# Patient Record
Sex: Female | Born: 1945
Health system: Southern US, Community
[De-identification: ages and names within clinical notes are randomized; demographics above are authoritative.]

## PROBLEM LIST (undated history)

## (undated) DIAGNOSIS — I10 Essential (primary) hypertension: Secondary | ICD-10-CM

## (undated) DIAGNOSIS — R569 Unspecified convulsions: Secondary | ICD-10-CM

## (undated) DIAGNOSIS — D649 Anemia, unspecified: Secondary | ICD-10-CM

## (undated) DIAGNOSIS — M1990 Unspecified osteoarthritis, unspecified site: Secondary | ICD-10-CM

## (undated) DIAGNOSIS — K863 Pseudocyst of pancreas: Secondary | ICD-10-CM

## (undated) DIAGNOSIS — F419 Anxiety disorder, unspecified: Secondary | ICD-10-CM

## (undated) DIAGNOSIS — I499 Cardiac arrhythmia, unspecified: Secondary | ICD-10-CM

## (undated) DIAGNOSIS — E139 Other specified diabetes mellitus without complications: Secondary | ICD-10-CM

## (undated) DIAGNOSIS — K8681 Exocrine pancreatic insufficiency: Secondary | ICD-10-CM

## (undated) DIAGNOSIS — E785 Hyperlipidemia, unspecified: Secondary | ICD-10-CM

## (undated) DIAGNOSIS — Z972 Presence of dental prosthetic device (complete) (partial): Secondary | ICD-10-CM

## (undated) DIAGNOSIS — K259 Gastric ulcer, unspecified as acute or chronic, without hemorrhage or perforation: Secondary | ICD-10-CM

## (undated) DIAGNOSIS — E119 Type 2 diabetes mellitus without complications: Secondary | ICD-10-CM

## (undated) HISTORY — DX: Exocrine pancreatic insufficiency: K86.81

## (undated) HISTORY — PX: CYSTECTOMY: SUR359

## (undated) HISTORY — DX: Unspecified osteoarthritis, unspecified site: M19.90

## (undated) HISTORY — DX: Essential (primary) hypertension: I10

## (undated) HISTORY — DX: Anxiety disorder, unspecified: F41.9

## (undated) HISTORY — DX: Pseudocyst of pancreas: K86.3

## (undated) HISTORY — DX: Unspecified convulsions: R56.9

## (undated) HISTORY — PX: LAPAROSCOPIC OVARIAN CYSTECTOMY: SUR786

## (undated) HISTORY — DX: Cardiac arrhythmia, unspecified: I49.9

## (undated) HISTORY — DX: Other specified diabetes mellitus without complications: E13.9

## (undated) HISTORY — DX: Anemia, unspecified: D64.9

## (undated) HISTORY — DX: Hyperlipidemia, unspecified: E78.5

## (undated) HISTORY — DX: Gastric ulcer, unspecified as acute or chronic, without hemorrhage or perforation: K25.9

## (undated) HISTORY — PX: TONSILLECTOMY: SUR1361

---

## 2011-01-28 DIAGNOSIS — M19049 Primary osteoarthritis, unspecified hand: Secondary | ICD-10-CM | POA: Diagnosis not present

## 2011-03-04 DIAGNOSIS — M159 Polyosteoarthritis, unspecified: Secondary | ICD-10-CM | POA: Diagnosis not present

## 2011-11-09 DIAGNOSIS — Z23 Encounter for immunization: Secondary | ICD-10-CM | POA: Diagnosis not present

## 2011-11-09 DIAGNOSIS — H43819 Vitreous degeneration, unspecified eye: Secondary | ICD-10-CM | POA: Diagnosis not present

## 2012-03-22 DIAGNOSIS — F411 Generalized anxiety disorder: Secondary | ICD-10-CM | POA: Diagnosis not present

## 2012-03-22 DIAGNOSIS — Z0000 Encounter for general adult medical examination without abnormal findings: Secondary | ICD-10-CM | POA: Diagnosis not present

## 2012-04-18 DIAGNOSIS — F411 Generalized anxiety disorder: Secondary | ICD-10-CM | POA: Diagnosis not present

## 2012-04-18 DIAGNOSIS — E785 Hyperlipidemia, unspecified: Secondary | ICD-10-CM | POA: Diagnosis not present

## 2012-05-09 ENCOUNTER — Ambulatory Visit: Payer: Self-pay | Admitting: Family Medicine

## 2012-05-09 DIAGNOSIS — F17200 Nicotine dependence, unspecified, uncomplicated: Secondary | ICD-10-CM | POA: Diagnosis not present

## 2012-05-09 DIAGNOSIS — M19039 Primary osteoarthritis, unspecified wrist: Secondary | ICD-10-CM | POA: Diagnosis not present

## 2012-05-09 DIAGNOSIS — M129 Arthropathy, unspecified: Secondary | ICD-10-CM | POA: Diagnosis not present

## 2012-05-09 DIAGNOSIS — Z23 Encounter for immunization: Secondary | ICD-10-CM | POA: Diagnosis not present

## 2012-05-09 DIAGNOSIS — Z0000 Encounter for general adult medical examination without abnormal findings: Secondary | ICD-10-CM | POA: Diagnosis not present

## 2012-05-09 DIAGNOSIS — F411 Generalized anxiety disorder: Secondary | ICD-10-CM | POA: Diagnosis not present

## 2012-09-22 DIAGNOSIS — Z23 Encounter for immunization: Secondary | ICD-10-CM | POA: Diagnosis not present

## 2013-06-10 DIAGNOSIS — F17200 Nicotine dependence, unspecified, uncomplicated: Secondary | ICD-10-CM | POA: Diagnosis not present

## 2013-06-10 DIAGNOSIS — R002 Palpitations: Secondary | ICD-10-CM | POA: Diagnosis not present

## 2013-06-10 DIAGNOSIS — E785 Hyperlipidemia, unspecified: Secondary | ICD-10-CM | POA: Diagnosis not present

## 2013-06-10 DIAGNOSIS — Z0000 Encounter for general adult medical examination without abnormal findings: Secondary | ICD-10-CM | POA: Diagnosis not present

## 2013-11-07 DIAGNOSIS — Z23 Encounter for immunization: Secondary | ICD-10-CM | POA: Diagnosis not present

## 2013-12-17 DIAGNOSIS — Z23 Encounter for immunization: Secondary | ICD-10-CM | POA: Diagnosis not present

## 2014-09-29 ENCOUNTER — Telehealth: Payer: Self-pay | Admitting: Family Medicine

## 2014-09-29 NOTE — Telephone Encounter (Signed)
Pt called needing  Refill on meds  Atenolol

## 2014-09-29 NOTE — Telephone Encounter (Signed)
Patient last seen 06/10/13. Patient needs bp f/u appt.

## 2014-10-01 ENCOUNTER — Encounter: Payer: Self-pay | Admitting: Family Medicine

## 2014-10-01 ENCOUNTER — Ambulatory Visit (INDEPENDENT_AMBULATORY_CARE_PROVIDER_SITE_OTHER): Payer: Medicare Other | Admitting: Family Medicine

## 2014-10-01 VITALS — BP 160/85 | HR 60 | Temp 97.7°F | Resp 16 | Ht 66.5 in | Wt 148.2 lb

## 2014-10-01 DIAGNOSIS — I499 Cardiac arrhythmia, unspecified: Secondary | ICD-10-CM | POA: Insufficient documentation

## 2014-10-01 DIAGNOSIS — I10 Essential (primary) hypertension: Secondary | ICD-10-CM | POA: Diagnosis not present

## 2014-10-01 DIAGNOSIS — I4940 Unspecified premature depolarization: Secondary | ICD-10-CM | POA: Diagnosis not present

## 2014-10-01 MED ORDER — METOPROLOL TARTRATE 25 MG PO TABS: 25.0000 mg | ORAL_TABLET | Freq: Two times a day (BID) | ORAL | Status: DC

## 2014-10-01 NOTE — Progress Notes (Signed)
Name: Holly Navarro   MRN: [id number]    DOB: 09-05-1945   Date:10/01/2014       Progress Note  Subjective  Chief Complaint  Chief Complaint  Patient presents with  . Irregular Heart Beat    needs refill was 06/15    HPI Here to get refills on Beta blocker.   She is running short and has been taking 1/2 Atenolol dose for past several days.  No f/u in > 1 yr.  Smoker with no desire to stop.  Her husband has adenocarcinoma of esophagus.  He has refused treatment. No problem-specific assessment & plan notes found for this encounter.   Past Medical History  Diagnosis Date  . Hyperlipidemia   . Anxiety   . Cardiac arrhythmia   . Arthritis   . Stomach ulcer     Past Surgical History  Procedure Laterality Date  . Cystectomy      Family History  Problem Relation Age of Onset  . Heart disease Mother   . Kidney disease Mother   . Diabetes Father   . Cancer Brother     pencreatic    Social History   Social History  . Marital Status: Married    Spouse Name: N/A  . Number of Children: N/A  . Years of Education: N/A   Occupational History  . Not on file.   Social History Main Topics  . Smoking status: Current Every Day Smoker -- 0.50 packs/day    Types: Cigarettes  . Smokeless tobacco: Not on file  . Alcohol Use: 0.0 oz/week    0 Standard drinks or equivalent per week  . Drug Use: No  . Sexual Activity: Not on file   Other Topics Concern  . Not on file   Social History Narrative  . No narrative on file     Current outpatient prescriptions:  .  atenolol (TENORMIN) 50 MG tablet, Take 50 mg by mouth daily., Disp: , Rfl:  .  co-enzyme Q-10 30 MG capsule, Take 30 mg by mouth 3 (three) times daily., Disp: , Rfl:  .  diphenhydrAMINE (SOMINEX) 25 MG tablet, Take 25 mg by mouth at bedtime as needed for sleep., Disp: , Rfl:  .  milk thistle 175 MG tablet, Take 175 mg by mouth daily., Disp: , Rfl:  .  omega-3 acid ethyl esters (LOVAZA) 1 G capsule, Take by mouth 2  (two) times daily., Disp: , Rfl:  .  Red Yeast Rice 600 MG CAPS, Take 1 capsule by mouth 2 (two) times daily., Disp: , Rfl:  .  simethicone (MYLICON) 80 MG chewable tablet, Chew 80 mg by mouth every 6 (six) hours as needed for flatulence., Disp: , Rfl:  .  Specialty Vitamins Products (ECHINACEA C COMPLETE PO), Take by mouth., Disp: , Rfl:  .  vitamin C (ASCORBIC ACID) 500 MG tablet, Take 500 mg by mouth daily. Only in winter, Disp: , Rfl:   No Known Allergies   Review of Systems  Constitutional: Negative for fever, chills, weight loss and malaise/fatigue.  HENT: Negative for hearing loss.   Eyes: Negative for blurred vision and double vision.  Respiratory: Negative for cough, sputum production, shortness of breath and wheezing.   Cardiovascular: Negative for chest pain, palpitations, orthopnea and leg swelling.  Gastrointestinal: Positive for heartburn (takes OTC Tagamet prn). Negative for abdominal pain and blood in stool.  Genitourinary: Negative for dysuria, urgency and frequency.  Musculoskeletal: Positive for joint pain. Negative for myalgias.  Skin: Negative for  rash.  Neurological: Negative for dizziness, sensory change, focal weakness, weakness and headaches.      Objective  Filed Vitals:   10/01/14 0942  BP: 166/86  Pulse: 62  Temp: 97.7 F (36.5 C)  TempSrc: Oral  Resp: 16  Height: 5' 6.5" (1.689 m)  Weight: 148 lb 3.2 oz (67.223 kg)    Physical Exam  Constitutional: She is oriented to person, place, and time and well-developed, well-nourished, and in no distress. No distress.  HENT:  Head: Normocephalic and atraumatic.  Eyes: Conjunctivae and EOM are normal. Pupils are equal, round, and reactive to light.  Neck: Normal range of motion. Neck supple. No thyromegaly present.  Cardiovascular: Normal rate, regular rhythm, normal heart sounds and intact distal pulses.  Exam reveals no gallop and no friction rub.   No murmur heard. Pulmonary/Chest: Effort normal and  breath sounds normal. No respiratory distress. She has no wheezes. She has no rales.  Abdominal: Soft. Bowel sounds are normal. She exhibits no distension and no mass. There is no tenderness.  Musculoskeletal: Normal range of motion. She exhibits no edema.  Lymphadenopathy:    She has no cervical adenopathy.  Neurological: She is alert and oriented to person, place, and time.  Vitals reviewed.      No results found for this or any previous visit (from the past 2160 hour(s)).   Assessment & Plan  Problem List Items Addressed This Visit    None      Meds ordered this encounter  Medications  . atenolol (TENORMIN) 50 MG tablet    Sig: Take 50 mg by mouth daily.  Marland Kitchen Specialty Vitamins Products (ECHINACEA C COMPLETE PO)    Sig: Take by mouth.  . simethicone (MYLICON) 80 MG chewable tablet    Sig: Chew 80 mg by mouth every 6 (six) hours as needed for flatulence.  . diphenhydrAMINE (SOMINEX) 25 MG tablet    Sig: Take 25 mg by mouth at bedtime as needed for sleep.  Marland Kitchen omega-3 acid ethyl esters (LOVAZA) 1 G capsule    Sig: Take by mouth 2 (two) times daily.  . Red Yeast Rice 600 MG CAPS    Sig: Take 1 capsule by mouth 2 (two) times daily.  Marland Kitchen co-enzyme Q-10 30 MG capsule    Sig: Take 30 mg by mouth 3 (three) times daily.  . vitamin C (ASCORBIC ACID) 500 MG tablet    Sig: Take 500 mg by mouth daily. Only in winter  . milk thistle 175 MG tablet    Sig: Take 175 mg by mouth daily.   1. Essential hypertension  - metoprolol tartrate (LOPRESSOR) 25 MG tablet; Take 1 tablet (25 mg total) by mouth 2 (two) times daily.  Dispense: 60 tablet; Refill: 3 - Comprehensive Metabolic Panel (CMET) - Lipid Profile - TSH  2. Premature depolarization  - metoprolol tartrate (LOPRESSOR) 25 MG tablet; Take 1 tablet (25 mg total) by mouth 2 (two) times daily.  Dispense: 60 tablet; Refill: 3 - CBC with Differential - TSH

## 2014-10-01 NOTE — Patient Instructions (Addendum)
Discussed stopping smoking.  Patient refuses to consider stopping.  Patient reports that she will get Flu Shot at [facility] in Nov.

## 2014-10-15 ENCOUNTER — Telehealth: Payer: Self-pay | Admitting: Family Medicine

## 2014-10-15 MED ORDER — ATENOLOL 100 MG PO TABS: 100.0000 mg | ORAL_TABLET | Freq: Every day | ORAL | Status: DC

## 2014-10-15 NOTE — Telephone Encounter (Signed)
Patient walked in office and requested refill on her Atenolol 50. Per Dr.Hawkins 100 mg was to be sent. Per patient that's fine but she will cut in half.

## 2014-10-15 NOTE — Telephone Encounter (Signed)
That did not control her BP before; it will not now.  I will recheck BP at her next visit.-jh

## 2014-10-16 NOTE — Telephone Encounter (Signed)
Keep her appt. With me as scheduled.-jh

## 2014-10-16 NOTE — Telephone Encounter (Signed)
Patient states she was running out of Atenolol on last visit as she was taking 1/2 of 50 mg to stretch meds. She took 1/2 of the 100mg  that was filled on yesterday and feels great this morning. She took bp upon arising this morning and bp was 134/84.

## 2014-10-22 DIAGNOSIS — I10 Essential (primary) hypertension: Secondary | ICD-10-CM | POA: Diagnosis not present

## 2014-10-22 DIAGNOSIS — I4940 Unspecified premature depolarization: Secondary | ICD-10-CM | POA: Diagnosis not present

## 2014-10-23 LAB — COMPREHENSIVE METABOLIC PANEL
ALT: 18 IU/L (ref 0–32)
AST: 15 IU/L (ref 0–40)
Albumin/Globulin Ratio: 2 (ref 1.1–2.5)
Albumin: 4.3 g/dL (ref 3.6–4.8)
Alkaline Phosphatase: 54 IU/L (ref 39–117)
BUN/Creatinine Ratio: 13 (ref 11–26)
BUN: 13 mg/dL (ref 8–27)
Bilirubin Total: 0.5 mg/dL (ref 0.0–1.2)
CO2: 25 mmol/L (ref 18–29)
Calcium: 9.6 mg/dL (ref 8.7–10.3)
Chloride: 103 mmol/L (ref 97–106)
Creatinine, Ser: 1.02 mg/dL — ABNORMAL HIGH (ref 0.57–1.00)
GFR calc Af Amer: 65 mL/min/{1.73_m2} (ref >59)
GFR calc non Af Amer: 56 mL/min/{1.73_m2} — ABNORMAL LOW (ref >59)
Globulin, Total: 2.2 g/dL (ref 1.5–4.5)
Glucose: 85 mg/dL (ref 65–99)
Potassium: 4.7 mmol/L (ref 3.5–5.2)
Sodium: 140 mmol/L (ref 136–144)
Total Protein: 6.5 g/dL (ref 6.0–8.5)

## 2014-10-23 LAB — CBC WITH DIFFERENTIAL/PLATELET
Basophils Absolute: 0.1 10*3/uL (ref 0.0–0.2)
Basos: 1 %
EOS (ABSOLUTE): 0.2 10*3/uL (ref 0.0–0.4)
Eos: 2 %
Hematocrit: 43.2 % (ref 34.0–46.6)
Hemoglobin: 14.8 g/dL (ref 11.1–15.9)
Immature Grans (Abs): 0 10*3/uL (ref 0.0–0.1)
Immature Granulocytes: 0 %
Lymphocytes Absolute: 3.7 10*3/uL — ABNORMAL HIGH (ref 0.7–3.1)
Lymphs: 41 %
MCH: 32.3 pg (ref 26.6–33.0)
MCHC: 34.3 g/dL (ref 31.5–35.7)
MCV: 94 fL (ref 79–97)
Monocytes Absolute: 0.9 10*3/uL (ref 0.1–0.9)
Monocytes: 10 %
Neutrophils Absolute: 4.1 10*3/uL (ref 1.4–7.0)
Neutrophils: 46 %
Platelets: 267 10*3/uL (ref 150–379)
RBC: 4.58 x10E6/uL (ref 3.77–5.28)
RDW: 13.5 % (ref 12.3–15.4)
WBC: 8.9 10*3/uL (ref 3.4–10.8)

## 2014-10-23 LAB — LIPID PANEL
Chol/HDL Ratio: 6.1 ratio units — ABNORMAL HIGH (ref 0.0–4.4)
Cholesterol, Total: 262 mg/dL — ABNORMAL HIGH (ref 100–199)
HDL: 43 mg/dL (ref >39)
LDL Calculated: 189 mg/dL — ABNORMAL HIGH (ref 0–99)
Triglycerides: 152 mg/dL — ABNORMAL HIGH (ref 0–149)
VLDL Cholesterol Cal: 30 mg/dL (ref 5–40)

## 2014-10-23 LAB — TSH: TSH: 2.75 u[IU]/mL (ref 0.450–4.500)

## 2014-11-03 ENCOUNTER — Ambulatory Visit (INDEPENDENT_AMBULATORY_CARE_PROVIDER_SITE_OTHER): Payer: Medicare Other | Admitting: Family Medicine

## 2014-11-03 ENCOUNTER — Encounter: Payer: Self-pay | Admitting: Family Medicine

## 2014-11-03 VITALS — BP 150/90 | HR 78 | Temp 98.3°F | Resp 16 | Ht 66.0 in | Wt 147.6 lb

## 2014-11-03 DIAGNOSIS — Z23 Encounter for immunization: Secondary | ICD-10-CM

## 2014-11-03 DIAGNOSIS — I129 Hypertensive chronic kidney disease with stage 1 through stage 4 chronic kidney disease, or unspecified chronic kidney disease: Secondary | ICD-10-CM | POA: Insufficient documentation

## 2014-11-03 DIAGNOSIS — I4940 Unspecified premature depolarization: Secondary | ICD-10-CM

## 2014-11-03 DIAGNOSIS — N289 Disorder of kidney and ureter, unspecified: Secondary | ICD-10-CM

## 2014-11-03 DIAGNOSIS — E785 Hyperlipidemia, unspecified: Secondary | ICD-10-CM | POA: Diagnosis not present

## 2014-11-03 DIAGNOSIS — N183 Chronic kidney disease, stage 3 unspecified: Secondary | ICD-10-CM | POA: Insufficient documentation

## 2014-11-03 DIAGNOSIS — I10 Essential (primary) hypertension: Secondary | ICD-10-CM | POA: Diagnosis not present

## 2014-11-03 MED ORDER — ATENOLOL 100 MG PO TABS: 100.0000 mg | ORAL_TABLET | Freq: Every day | ORAL | Status: DC

## 2014-11-03 NOTE — Patient Instructions (Signed)
Plan Lipid panel and BMP on return.

## 2014-11-03 NOTE — Progress Notes (Signed)
Name: Holly Navarro   MRN: [id number]    DOB: 07-26-1945   Date:11/03/2014       Progress Note  Subjective  Chief Complaint  Chief Complaint  Patient presents with  . Hypertension    medication question    HPI Here for f/u of HBP.  Blames her sl. Low GFR and elevated chol  On being changed from atenolol to metoprolol.  Now back on Atenolol and she has altered dose to 1/2 100 mg. Tab/d  because that is what she thinks she should take./   BPs at home in 120-130 range according to her. Back on Atenolol for 2.5 weeks.   She declines to get repeat BMP or Lipoid panel before early 2017. No problem-specific assessment & plan notes found for this encounter.   Past Medical History  Diagnosis Date  . Hyperlipidemia   . Anxiety   . Cardiac arrhythmia   . Arthritis   . Stomach ulcer     Social History  Substance Use Topics  . Smoking status: Current Every Day Smoker -- 0.50 packs/day    Types: Cigarettes  . Smokeless tobacco: Not on file  . Alcohol Use: 0.0 oz/week    0 Standard drinks or equivalent per week     Current outpatient prescriptions:  .  atenolol (TENORMIN) 100 MG tablet, Take 1 tablet (100 mg total) by mouth daily., Disp: 30 tablet, Rfl: 0 .  co-enzyme Q-10 30 MG capsule, Take 30 mg by mouth 3 (three) times daily., Disp: , Rfl:  .  milk thistle 175 MG tablet, Take 175 mg by mouth daily., Disp: , Rfl:  .  omega-3 acid ethyl esters (LOVAZA) 1 G capsule, Take by mouth 2 (two) times daily., Disp: , Rfl:  .  Red Yeast Rice 600 MG CAPS, Take 1 capsule by mouth 2 (two) times daily., Disp: , Rfl:  .  simethicone (MYLICON) 80 MG chewable tablet, Chew 80 mg by mouth every 6 (six) hours as needed for flatulence. Pt takes capsule not chewable, Disp: , Rfl:  .  Specialty Vitamins Products (ECHINACEA C COMPLETE PO), Take by mouth., Disp: , Rfl:  .  vitamin C (ASCORBIC ACID) 500 MG tablet, Take 500 mg by mouth daily. Only in winter, Disp: , Rfl:   No Known Allergies  Review of  Systems  Constitutional: Negative for fever, chills, weight loss and malaise/fatigue.  HENT: Negative for hearing loss.   Eyes: Negative for blurred vision and double vision.  Respiratory: Negative for cough, shortness of breath and wheezing.   Cardiovascular: Negative for chest pain, palpitations and leg swelling.  Gastrointestinal: Positive for heartburn (occ.). Negative for abdominal pain and blood in stool.  Genitourinary: Negative for dysuria, urgency and frequency.  Musculoskeletal: Positive for joint pain (multiple). Negative for myalgias.  Skin: Negative for rash.  Neurological: Negative for dizziness, tremors, weakness and headaches.      Objective  Filed Vitals:   11/03/14 1304  BP: 145/88  Pulse: 65  Temp: 98.3 F (36.8 C)  TempSrc: Oral  Resp: 16  Height:  (1.676 m)  Weight: 147 lb 9.6 oz (66.951 kg)     Physical Exam  Constitutional: She is oriented to person, place, and time and well-developed, well-nourished, and in no distress. No distress.  HENT:  Head: Normocephalic and atraumatic.  Eyes: Conjunctivae and EOM are normal. Pupils are equal, round, and reactive to light. No scleral icterus.  Neck: Normal range of motion. Neck supple. Carotid bruit is not present.  No thyromegaly present.  Cardiovascular: Normal rate, regular rhythm, normal heart sounds and intact distal pulses.  Exam reveals no gallop and no friction rub.   No murmur heard. Pulmonary/Chest: Effort normal and breath sounds normal. No respiratory distress. She has no wheezes. She has no rales.  Abdominal: Soft. Bowel sounds are normal. She exhibits no distension and no mass. There is no tenderness.  Musculoskeletal: Normal range of motion. She exhibits no edema.  Lymphadenopathy:    She has no cervical adenopathy.  Neurological: She is alert and oriented to person, place, and time.  Vitals reviewed.     Recent Results (from the past 2160 hour(s))  Comprehensive Metabolic Panel (CMET)      Status: Abnormal   Collection Time: 10/22/14  1:04 PM  Result Value Ref Range   Glucose 85 65 - 99 mg/dL   BUN 13 8 - 27 mg/dL   Creatinine, Ser 1.61 (H) 0.57 - 1.00 mg/dL   GFR calc non Af Amer 56 (L) >59 mL/min/1.73   GFR calc Af Amer 65 >59 mL/min/1.73   BUN/Creatinine Ratio 13 11 - 26   Sodium 140 136 - 144 mmol/L    Comment:               **Please note reference interval change**   Potassium 4.7 3.5 - 5.2 mmol/L    Comment:               **Please note reference interval change**   Chloride 103 97 - 106 mmol/L    Comment:               **Please note reference interval change**   CO2 25 18 - 29 mmol/L   Calcium 9.6 8.7 - 10.3 mg/dL   Total Protein 6.5 6.0 - 8.5 g/dL   Albumin 4.3 3.6 - 4.8 g/dL   Globulin, Total 2.2 1.5 - 4.5 g/dL   Albumin/Globulin Ratio 2.0 1.1 - 2.5   Bilirubin Total 0.5 0.0 - 1.2 mg/dL   Alkaline Phosphatase 54 39 - 117 IU/L   AST 15 0 - 40 IU/L   ALT 18 0 - 32 IU/L  CBC with Differential     Status: Abnormal   Collection Time: 10/22/14  1:04 PM  Result Value Ref Range   WBC 8.9 3.4 - 10.8 x10E3/uL   RBC 4.58 3.77 - 5.28 x10E6/uL   Hemoglobin 14.8 11.1 - 15.9 g/dL   Hematocrit 09.6 04.5 - 46.6 %   MCV 94 79 - 97 fL   MCH 32.3 26.6 - 33.0 pg   MCHC 34.3 31.5 - 35.7 g/dL   RDW 40.9 81.1 - 91.4 %   Platelets 267 150 - 379 x10E3/uL   Neutrophils 46 %   Lymphs 41 %   Monocytes 10 %   Eos 2 %   Basos 1 %   Neutrophils Absolute 4.1 1.4 - 7.0 x10E3/uL   Lymphocytes Absolute 3.7 (H) 0.7 - 3.1 x10E3/uL   Monocytes Absolute 0.9 0.1 - 0.9 x10E3/uL   EOS (ABSOLUTE) 0.2 0.0 - 0.4 x10E3/uL   Basophils Absolute 0.1 0.0 - 0.2 x10E3/uL   Immature Granulocytes 0 %   Immature Grans (Abs) 0.0 0.0 - 0.1 x10E3/uL  Lipid Profile     Status: Abnormal   Collection Time: 10/22/14  1:04 PM  Result Value Ref Range   Cholesterol, Total 262 (H) 100 - 199 mg/dL   Triglycerides 782 (H) 0 - 149 mg/dL   HDL 43 >95 mg/dL  Comment: According to ATP-III Guidelines,  HDL-C >59 mg/dL is considered a negative risk factor for CHD.    VLDL Cholesterol Cal 30 5 - 40 mg/dL   LDL Calculated 161189 (H) 0 - 99 mg/dL   Chol/HDL Ratio 6.1 (H) 0.0 - 4.4 ratio units    Comment:                                   T. Chol/HDL Ratio                                             Men  Women                               1/2 Avg.Risk  3.4    3.3                                   Avg.Risk  5.0    4.4                                2X Avg.Risk  9.6    7.1                                3X Avg.Risk 23.4   11.0   TSH     Status: None   Collection Time: 10/22/14  1:04 PM  Result Value Ref Range   TSH 2.750 0.450 - 4.500 uIU/mL     Assessment & Plan  1. Essential hypertension  - atenolol (TENORMIN) 100 MG tablet; Take 1 tablet (100 mg total) by mouth daily.  Dispense: 30 tablet; Refill: 6  2. Premature depolarization  - atenolol (TENORMIN) 100 MG tablet; Take 1 tablet (100 mg total) by mouth daily.  Dispense: 30 tablet; Refill: 6  3. Elevated lipids   4. Renal insufficiency   5. Need for influenza vaccination  - Flu vaccine HIGH DOSE PF (Fluzone High dose)

## 2014-11-10 ENCOUNTER — Other Ambulatory Visit: Payer: Self-pay | Admitting: Family Medicine

## 2015-01-12 ENCOUNTER — Ambulatory Visit: Payer: Medicare Other | Admitting: Family Medicine

## 2015-05-25 ENCOUNTER — Other Ambulatory Visit: Payer: Self-pay | Admitting: Family Medicine

## 2015-12-02 DIAGNOSIS — Z23 Encounter for immunization: Secondary | ICD-10-CM | POA: Diagnosis not present

## 2016-11-23 DIAGNOSIS — Z23 Encounter for immunization: Secondary | ICD-10-CM | POA: Diagnosis not present

## 2017-03-23 ENCOUNTER — Other Ambulatory Visit: Payer: Self-pay

## 2017-03-23 ENCOUNTER — Ambulatory Visit (INDEPENDENT_AMBULATORY_CARE_PROVIDER_SITE_OTHER): Payer: Medicare Other | Admitting: Nurse Practitioner

## 2017-03-23 VITALS — BP 168/94 | HR 66 | Temp 98.2°F | Ht 66.5 in | Wt 153.0 lb

## 2017-03-23 DIAGNOSIS — R002 Palpitations: Secondary | ICD-10-CM

## 2017-03-23 DIAGNOSIS — I10 Essential (primary) hypertension: Secondary | ICD-10-CM | POA: Diagnosis not present

## 2017-03-23 DIAGNOSIS — I498 Other specified cardiac arrhythmias: Secondary | ICD-10-CM | POA: Diagnosis not present

## 2017-03-23 MED ORDER — ATENOLOL 50 MG PO TABS: 50.0000 mg | ORAL_TABLET | Freq: Every day | ORAL | 3 refills | Status: DC

## 2017-03-23 MED ORDER — LISINOPRIL 10 MG PO TABS: 10.0000 mg | ORAL_TABLET | Freq: Every day | ORAL | 1 refills | Status: DC

## 2017-03-23 NOTE — Patient Instructions (Addendum)
Barbi A Principato,   Thank you for coming in to clinic today.  Continue atenolol 50 mg once daily.  START lisinopril 10 mg once daily. - We need to check you kidney function now. Return for labs on Monday.  We will recheck in about 4 weeks with your followup visit.  Please schedule a follow-up appointment with Wilhelmina Mcardle, AGNP. Return in about 1 month (around 04/20/2017) for hypertension.  If you have any other questions or concerns, please feel free to call the clinic or send a message through MyChart. You may also schedule an earlier appointment if necessary.  You will receive a survey after today's visit either digitally by e-mail or paper by [employer]. Your experiences and feedback matter to [place].  Please respond so we know how we are doing as we provide care for you.   Wilhelmina Mcardle, DNP, AGNP-BC Adult Gerontology Nurse Practitioner [facility], [facility]    Managing Your Hypertension Hypertension is commonly called high blood pressure. This is when the force of your blood pressing against the walls of your arteries is too strong. Arteries are blood vessels that carry blood from your heart throughout your body. Hypertension forces the heart to work harder to pump blood, and may cause the arteries to become narrow or stiff. Having untreated or uncontrolled hypertension can cause heart attack, stroke, kidney disease, and other problems. What are blood pressure readings? A blood pressure reading consists of a higher number over a lower number. Ideally, your blood pressure should be below 120/80. The first ("top") number is called the systolic pressure. It is a measure of the pressure in your arteries as your heart beats. The second ("bottom") number is called the diastolic pressure. It is a measure of the pressure in your arteries as the heart relaxes. What does my blood pressure reading mean? Blood pressure is classified into four stages. Based on your blood pressure reading,  your health care provider may use the following stages to determine what type of treatment you need, if any. Systolic pressure and diastolic pressure are measured in a unit called mm Hg. Normal  Systolic pressure: below 120.  Diastolic pressure: below 80. Elevated  Systolic pressure: 120-129.  Diastolic pressure: below 80. Hypertension stage 1  Systolic pressure: 130-139.  Diastolic pressure: 80-89. Hypertension stage 2  Systolic pressure: 140 or above.  Diastolic pressure: 90 or above. What health risks are associated with hypertension? Managing your hypertension is an important responsibility. Uncontrolled hypertension can lead to:  A heart attack.  A stroke.  A weakened blood vessel (aneurysm).  Heart failure.  Kidney damage.  Eye damage.  Metabolic syndrome.  Memory and concentration problems.  What changes can I make to manage my hypertension? Hypertension can be managed by making lifestyle changes and possibly by taking medicines. Your health care provider will help you make a plan to bring your blood pressure within a normal range. Eating and drinking  Eat a diet that is high in fiber and potassium, and low in salt (sodium), added sugar, and fat. An example eating plan is called the DASH (Dietary Approaches to Stop Hypertension) diet. To eat this way: ? Eat plenty of fresh fruits and vegetables. Try to fill half of your plate at each meal with fruits and vegetables. ? Eat whole grains, such as whole wheat pasta, brown rice, or whole grain bread. Fill about one quarter of your plate with whole grains. ? Eat low-fat diary products. ? Avoid fatty cuts of meat,  processed or cured meats, and poultry with skin. Fill about one quarter of your plate with lean proteins such as fish, chicken without skin, beans, eggs, and tofu. ? Avoid premade and processed foods. These tend to be higher in sodium, added sugar, and fat.  Reduce your daily sodium intake. Most people  with hypertension should eat less than 1,500 mg of sodium a day.  Limit alcohol intake to no more than 1 drink a day for nonpregnant women and 2 drinks a day for men. One drink equals 12 oz of beer, 5 oz of wine, or 1 oz of hard liquor. Lifestyle  Work with your health care provider to maintain a healthy body weight, or to lose weight. Ask what an ideal weight is for you.  Get at least 30 minutes of exercise that causes your heart to beat faster (aerobic exercise) most days of the week. Activities may include walking, swimming, or biking.  Include exercise to strengthen your muscles (resistance exercise), such as weight lifting, as part of your weekly exercise routine. Try to do these types of exercises for 30 minutes at least 3 days a week.  Do not use any products that contain nicotine or tobacco, such as cigarettes and e-cigarettes. If you need help quitting, ask your health care provider.  Control any long-term (chronic) conditions you have, such as high cholesterol or diabetes. Monitoring  Monitor your blood pressure at home as told by your health care provider. Your personal target blood pressure may vary depending on your medical conditions, your age, and other factors.  Have your blood pressure checked regularly, as often as told by your health care provider. Working with your health care provider  Review all the medicines you take with your health care provider because there may be side effects or interactions.  Talk with your health care provider about your diet, exercise habits, and other lifestyle factors that may be contributing to hypertension.  Visit your health care provider regularly. Your health care provider can help you create and adjust your plan for managing hypertension. Will I need medicine to control my blood pressure? Your health care provider may prescribe medicine if lifestyle changes are not enough to get your blood pressure under control, and if:  Your  systolic blood pressure is 130 or higher.  Your diastolic blood pressure is 80 or higher.  Take medicines only as told by your health care provider. Follow the directions carefully. Blood pressure medicines must be taken as prescribed. The medicine does not work as well when you skip doses. Skipping doses also puts you at risk for problems. Contact a health care provider if:  You think you are having a reaction to medicines you have taken.  You have repeated (recurrent) headaches.  You feel dizzy.  You have swelling in your ankles.  You have trouble with your vision. Get help right away if:  You develop a severe headache or confusion.  You have unusual weakness or numbness, or you feel faint.  You have severe pain in your chest or abdomen.  You vomit repeatedly.  You have trouble breathing. Summary  Hypertension is when the force of blood pumping through your arteries is too strong. If this condition is not controlled, it may put you at risk for serious complications.  Your personal target blood pressure may vary depending on your medical conditions, your age, and other factors. For most people, a normal blood pressure is less than 120/80.  Hypertension is managed by lifestyle changes,  medicines, or both. Lifestyle changes include weight loss, eating a healthy, low-sodium diet, exercising more, and limiting alcohol. This information is not intended to replace advice given to you by your health care provider. Make sure you discuss any questions you have with your health care provider. Document Released: 09/14/2011 Document Revised: 11/18/2015 Document Reviewed: 11/18/2015 Elsevier Interactive Patient Education  [employer]2018 [employer]

## 2017-03-23 NOTE — Progress Notes (Signed)
Subjective:    Patient ID: Holly Navarro, female    DOB: 02-08-1945, 72 y.o.   MRN: [id number]  Holly Navarro is a 72 y.o. female presenting on 03/23/2017 for Medication Refill Patient presents today for follow-up for hypertension.  HPI  Hypertension - She is not checking BP at home or outside of clinic.    - Current medications: atenolol 50 mg once daily despite prescription for 100 mg once daily, tolerating well without side effects - She is not currently symptomatic. - Pt denies headache, lightheadedness, dizziness, changes in vision, chest tightness/pressure, palpitations, leg swelling, sudden loss of speech or loss of consciousness. - She  reports no regular exercise routine. - Her diet is moderate in salt, moderate in fat, and moderate in carbohydrates.   Past Medical History:  Diagnosis Date  . Anxiety   . Arthritis   . Cardiac arrhythmia   . Hyperlipidemia   . Stomach ulcer    Past Surgical History:  Procedure Laterality Date  . CYSTECTOMY     Social History   Socioeconomic History  . Marital status: Married    Spouse name: Not on file  . Number of children: Not on file  . Years of education: Not on file  . Highest education level: Not on file  Occupational History  . Not on file  Social Needs  . Financial resource strain: Not on file  . Food insecurity:    Worry: Not on file    Inability: Not on file  . Transportation needs:    Medical: Not on file    Non-medical: Not on file  Tobacco Use  . Smoking status: Current Every Day Smoker    Packs/day: 0.50    Types: Cigarettes  Substance and Sexual Activity  . Alcohol use: Yes    Alcohol/week: 0.0 oz  . Drug use: No  . Sexual activity: Not on file  Lifestyle  . Physical activity:    Days per week: Not on file    Minutes per session: Not on file  . Stress: Not on file  Relationships  . Social connections:    Talks on phone: Not on file    Gets together: Not on file    Attends religious service:  Not on file    Active member of club or organization: Not on file    Attends meetings of clubs or organizations: Not on file    Relationship status: Not on file  . Intimate partner violence:    Fear of current or ex partner: Not on file    Emotionally abused: Not on file    Physically abused: Not on file    Forced sexual activity: Not on file  Other Topics Concern  . Not on file  Social History Narrative  . Not on file   Family History  Problem Relation Age of Onset  . Heart disease Mother   . Kidney disease Mother   . Diabetes Father   . Cancer Brother        pencreatic   Current Outpatient Medications on File Prior to Visit  Medication Sig  . co-enzyme Q-10 30 MG capsule Take 30 mg by mouth 3 (three) times daily.  . milk thistle 175 MG tablet Take 175 mg by mouth daily.  Marland Kitchen omega-3 acid ethyl esters (LOVAZA) 1 G capsule Take by mouth 2 (two) times daily.  . Red Yeast Rice 600 MG CAPS Take 1 capsule by mouth 2 (two) times daily.  . simethicone (MYLICON)  80 MG chewable tablet Chew 80 mg by mouth every 6 (six) hours as needed for flatulence. Pt takes capsule not chewable  . Specialty Vitamins Products (ECHINACEA C COMPLETE PO) Take by mouth.  . vitamin C (ASCORBIC ACID) 500 MG tablet Take 500 mg by mouth daily. Only in winter   No current facility-administered medications on file prior to visit.     Review of Systems Per HPI unless specifically indicated above     Objective:    BP (!) 168/94 (BP Location: Right Arm, Patient Position: Sitting, Cuff Size: Normal)   Pulse 66   Temp 98.2 F (36.8 C)   Ht 5' 6.5" (1.689 m)   Wt 153 lb (69.4 kg)   BMI 24.32 kg/m   Wt Readings from Last 3 Encounters:  03/23/17 153 lb (69.4 kg)  11/03/14 147 lb 9.6 oz (67 kg)  10/01/14 148 lb 3.2 oz (67.2 kg)    Physical Exam  General - overweight, well-appearing, NAD HEENT - Normocephalic, atraumatic Neck - supple, non-tender, no LAD, no thyromegaly, no carotid bruit Heart - RRR, no  murmurs heard Lungs - Clear throughout all lobes, no wheezing, crackles, or rhonchi. Normal work of breathing. Extremeties - non-tender, no edema, cap refill < 2 seconds, peripheral pulses intact +2 bilaterally Skin - warm, dry Neuro - awake, alert, oriented x3, normal gait Psych - Normal mood and affect, normal behavior       Assessment & Plan:   Problem List Items Addressed This Visit      Cardiovascular and Mediastinum   HBP (high blood pressure)   Relevant Medications   atenolol (TENORMIN) 50 MG tablet   lisinopril (PRINIVIL,ZESTRIL) 10 MG tablet   Other Relevant Orders   BASIC METABOLIC PANEL WITH GFR   COMPLETE METABOLIC PANEL WITH GFR   Cardiac arrhythmia - Primary   Relevant Medications   atenolol (TENORMIN) 50 MG tablet   lisinopril (PRINIVIL,ZESTRIL) 10 MG tablet    Other Visit Diagnoses    Palpitations       Relevant Medications   atenolol (TENORMIN) 50 MG tablet    Currently uncontrolled hypertension.  BP goal < 130/80.  Pt is not currently working on lifestyle modifications.  Intermittently taking medications tolerating well without side effects. Uncontrolled hypertension without identified complications.  Plan: 1. Continue taking atelolol 50 mg once daily - START lisinopril 10 mg once daily. 2. Obtain labs CMP, Lipid  3. Encouraged heart healthy diet and increasing exercise to 30 minutes most days of the week. 4. Check BP 1-2 x per week at home, keep log, and bring to clinic at next appointment. 5. Follow up 1 months.     Meds ordered this encounter  Medications  . DISCONTD: atenolol (TENORMIN) 50 MG tablet    Sig: Take 1 tablet (50 mg total) by mouth daily.    Dispense:  90 tablet    Refill:  3    Order Specific Question:   Supervising Provider    Answer:   Smitty CordsKARAMALEGOS, ALEXANDER J [[id number]]  . DISCONTD: lisinopril (PRINIVIL,ZESTRIL) 10 MG tablet    Sig: Take 1 tablet (10 mg total) by mouth daily.    Dispense:  90 tablet    Refill:  1    Order Specific  Question:   Supervising Provider    Answer:   Smitty CordsKARAMALEGOS, ALEXANDER J [[id number]]  . atenolol (TENORMIN) 50 MG tablet    Sig: Take 1 tablet (50 mg total) by mouth daily.    Dispense:  90 tablet  Refill:  3    Order Specific Question:   Supervising Provider    Answer:   Smitty Cords [[id number]]  . lisinopril (PRINIVIL,ZESTRIL) 10 MG tablet    Sig: Take 1 tablet (10 mg total) by mouth daily.    Dispense:  90 tablet    Refill:  1    Order Specific Question:   Supervising Provider    Answer:   Smitty Cords [[id number]]      Follow up plan: Return in about 1 month (around 04/20/2017) for hypertension, labs.  Wilhelmina Mcardle, DNP, AGPCNP-BC Adult Gerontology Primary Care Nurse Practitioner [facility] 03/26/2017, 10:16 PM

## 2017-03-24 ENCOUNTER — Telehealth: Payer: Self-pay | Admitting: Nurse Practitioner

## 2017-03-24 NOTE — Telephone Encounter (Signed)
Attempted to contact the pt, no answer or vm. I will try the pt back at a later time.

## 2017-03-24 NOTE — Telephone Encounter (Signed)
Pt has a question about lisinopril [phone]

## 2017-03-24 NOTE — Telephone Encounter (Signed)
The pt called with concerns that her bp was elevated, because she had not eating anything before she came in the office. I informed her that that should not effect her bp. She stated that her bp was 120/78 at home after eating. I recommended the pt to bring her bp machine in with her when she returns for her f/u visit. She asked if she could take her Lisinopril w/ her Atenolol. I informed the patient that she could.

## 2017-03-24 NOTE — Telephone Encounter (Signed)
Reply is appropriate.  Had reviewed s/sx of low blood pressure if experiencing any changes with new medication previously.  NO additional action is needed.

## 2017-03-26 ENCOUNTER — Encounter: Payer: Self-pay | Admitting: Nurse Practitioner

## 2017-03-27 ENCOUNTER — Other Ambulatory Visit: Payer: Medicare Other

## 2017-03-27 DIAGNOSIS — I10 Essential (primary) hypertension: Secondary | ICD-10-CM | POA: Diagnosis not present

## 2017-03-28 LAB — COMPLETE METABOLIC PANEL WITH GFR
AG Ratio: 1.6 (calc) (ref 1.0–2.5)
ALT: 14 U/L (ref 6–29)
AST: 16 U/L (ref 10–35)
Albumin: 4.1 g/dL (ref 3.6–5.1)
Alkaline phosphatase (APISO): 62 U/L (ref 33–130)
BUN/Creatinine Ratio: 9 (calc) (ref 6–22)
BUN: 10 mg/dL (ref 7–25)
CO2: 27 mmol/L (ref 20–32)
Calcium: 9.8 mg/dL (ref 8.6–10.4)
Chloride: 106 mmol/L (ref 98–110)
Creat: 1.14 mg/dL — ABNORMAL HIGH (ref 0.60–0.93)
GFR, Est African American: 56 mL/min/{1.73_m2} — ABNORMAL LOW (ref >=60)
GFR, Est Non African American: 48 mL/min/{1.73_m2} — ABNORMAL LOW (ref >=60)
Globulin: 2.6 g/dL (calc) (ref 1.9–3.7)
Glucose, Bld: 98 mg/dL (ref 65–99)
Potassium: 4.2 mmol/L (ref 3.5–5.3)
Sodium: 140 mmol/L (ref 135–146)
Total Bilirubin: 0.6 mg/dL (ref 0.2–1.2)
Total Protein: 6.7 g/dL (ref 6.1–8.1)

## 2017-04-20 ENCOUNTER — Ambulatory Visit (INDEPENDENT_AMBULATORY_CARE_PROVIDER_SITE_OTHER): Payer: Medicare Other | Admitting: Nurse Practitioner

## 2017-04-20 ENCOUNTER — Other Ambulatory Visit: Payer: Self-pay

## 2017-04-20 ENCOUNTER — Encounter: Payer: Self-pay | Admitting: Nurse Practitioner

## 2017-04-20 VITALS — BP 143/88 | HR 70 | Temp 98.1°F | Ht 66.5 in | Wt 152.2 lb

## 2017-04-20 DIAGNOSIS — I10 Essential (primary) hypertension: Secondary | ICD-10-CM | POA: Diagnosis not present

## 2017-04-20 NOTE — Patient Instructions (Addendum)
Holly Navarro,   Thank you for coming in to clinic today.  1. Return to clinic for your blood recheck of kidney function.  2. Continue all medications without changes.  Blood pressure goal is less than 130/80.    Please schedule a follow-up appointment with Wilhelmina McardleLauren Trini Christiansen, AGNP. Return in about 2 months (around 06/26/2017) for hypertension.  If you have any other questions or concerns, please feel free to call the clinic or send a message through MyChart. You may also schedule an earlier appointment if necessary.  You will receive a survey after today's visit either digitally by e-mail or paper by [employer] mail. Your experiences and feedback matter to us.  Please respond so we know how we are doing as we provide care for you.   Wilhelmina McardleLauren Calen Geister, DNP, AGNP-BC Adult Gerontology Nurse Practitioner [facility], [facility]

## 2017-04-20 NOTE — Progress Notes (Signed)
Subjective:    Patient ID: Holly Navarro, female    DOB: 1945/11/24, 72 y.o.   MRN: [id number]  Holly Navarro is a 72 y.o. female presenting on 04/20/2017 for Hypertension   HPI  Hypertension - She is checking BP at home or outside of clinic.  Readings  115/75; 138/81, Usually 120/80s  - Current medications: atenolol 50 mg daily, lisinopril 10 mg once daily, tolerating well without side effects - She is not currently symptomatic. - Pt denies headache, lightheadedness, dizziness, changes in vision, chest tightness/pressure, palpitations, leg swelling, sudden loss of speech or loss of consciousness. - She  reports increasing physical activity with warmer weather and more outdoor home activity. - Her diet is moderate in salt, moderate in fat, and moderate in carbohydrates.    Social History   Tobacco Use  . Smoking status: Current Every Day Smoker    Packs/day: 0.50    Types: Cigarettes  . Smokeless tobacco: Never Used  Substance Use Topics  . Alcohol use: Yes    Alcohol/week: 0.0 oz  . Drug use: No    Review of Systems Per HPI unless specifically indicated above     Objective:    BP (!) 143/88 (BP Location: Left Arm, Patient Position: Sitting, Cuff Size: Normal)   Pulse 70   Temp 98.1 F (36.7 C) (Oral)   Ht 5' 6.5" (1.689 m)   Wt 152 lb 3.2 oz (69 kg)   BMI 24.20 kg/m   Wt Readings from Last 3 Encounters:  04/20/17 152 lb 3.2 oz (69 kg)  03/23/17 153 lb (69.4 kg)  11/03/14 147 lb 9.6 oz (67 kg)    Physical Exam  Constitutional: She is oriented to person, place, and time. She appears well-developed and well-nourished. No distress.  HENT:  Head: Normocephalic and atraumatic.  Cardiovascular: Normal rate, regular rhythm, S1 normal, S2 normal, normal heart sounds and intact distal pulses. Exam reveals no gallop and no friction rub.  No murmur heard. Pulmonary/Chest: Effort normal and breath sounds normal. No respiratory distress.  Neurological: She is alert and  oriented to person, place, and time.  Skin: Skin is warm and dry. Capillary refill takes less than 2 seconds.  Psychiatric: She has a normal mood and affect. Her behavior is normal. Judgment and thought content normal.  Vitals reviewed.   Results for orders placed or performed in visit on 03/27/17  COMPLETE METABOLIC PANEL WITH GFR  Result Value Ref Range   Glucose, Bld 98 65 - 99 mg/dL   BUN 10 7 - 25 mg/dL   Creat 4.09 (H) 8.11 - 0.93 mg/dL   GFR, Est Non African American 48 (L) > OR = 60 mL/min/1.42m2   GFR, Est African American 56 (L) > OR = 60 mL/min/1.31m2   BUN/Creatinine Ratio 9 6 - 22 (calc)   Sodium 140 135 - 146 mmol/L   Potassium 4.2 3.5 - 5.3 mmol/L   Chloride 106 98 - 110 mmol/L   CO2 27 20 - 32 mmol/L   Calcium 9.8 8.6 - 10.4 mg/dL   Total Protein 6.7 6.1 - 8.1 g/dL   Albumin 4.1 3.6 - 5.1 g/dL   Globulin 2.6 1.9 - 3.7 g/dL (calc)   AG Ratio 1.6 1.0 - 2.5 (calc)   Total Bilirubin 0.6 0.2 - 1.2 mg/dL   Alkaline phosphatase (APISO) 62 33 - 130 U/L   AST 16 10 - 35 U/L   ALT 14 6 - 29 U/L      Assessment &  Plan:   Problem List Items Addressed This Visit      Cardiovascular and Mediastinum   HBP (high blood pressure) - Primary   Relevant Orders   COMPLETE METABOLIC PANEL WITH GFR    Controlled hypertension.  BP goal < 130/80.  Pt is working on lifestyle modifications.  Taking medications tolerating well without side effects. Mildly increased Cr on last check.  No other identified complications.  Plan: 1. Continue taking lisinopril 10 mg once daily 2. Obtain labs CMP tomorrow in clinic. Reassess lisinopril addition and kidney function. 3. Encouraged heart healthy diet and increasing exercise to 30 minutes most days of the week. 4. Check BP 1-2 x per week at home, keep log, and bring to clinic at next appointment. 5. Follow up 3 months.       Follow up plan: Return in about 2 months (around 06/26/2017) for hypertension.  Wilhelmina Mcardle, DNP,  AGPCNP-BC Adult Gerontology Primary Care Nurse Practitioner [facility] 04/20/2017, 3:34 PM

## 2017-04-21 ENCOUNTER — Other Ambulatory Visit: Payer: Medicare Other

## 2017-04-21 DIAGNOSIS — I10 Essential (primary) hypertension: Secondary | ICD-10-CM | POA: Diagnosis not present

## 2017-04-21 LAB — COMPLETE METABOLIC PANEL WITH GFR
AG Ratio: 1.7 (calc) (ref 1.0–2.5)
ALT: 14 U/L (ref 6–29)
AST: 17 U/L (ref 10–35)
Albumin: 4.1 g/dL (ref 3.6–5.1)
Alkaline phosphatase (APISO): 58 U/L (ref 33–130)
BUN/Creatinine Ratio: 13 (calc) (ref 6–22)
BUN: 17 mg/dL (ref 7–25)
CO2: 26 mmol/L (ref 20–32)
Calcium: 9.8 mg/dL (ref 8.6–10.4)
Chloride: 106 mmol/L (ref 98–110)
Creat: 1.28 mg/dL — ABNORMAL HIGH (ref 0.60–0.93)
GFR, Est African American: 48 mL/min/{1.73_m2} — ABNORMAL LOW (ref >=60)
GFR, Est Non African American: 42 mL/min/{1.73_m2} — ABNORMAL LOW (ref >=60)
Globulin: 2.4 g/dL (calc) (ref 1.9–3.7)
Glucose, Bld: 90 mg/dL (ref 65–99)
Potassium: 4.4 mmol/L (ref 3.5–5.3)
Sodium: 139 mmol/L (ref 135–146)
Total Bilirubin: 0.4 mg/dL (ref 0.2–1.2)
Total Protein: 6.5 g/dL (ref 6.1–8.1)

## 2017-06-26 ENCOUNTER — Other Ambulatory Visit: Payer: Self-pay

## 2017-06-26 ENCOUNTER — Ambulatory Visit (INDEPENDENT_AMBULATORY_CARE_PROVIDER_SITE_OTHER): Payer: Medicare Other | Admitting: Nurse Practitioner

## 2017-06-26 ENCOUNTER — Encounter: Payer: Self-pay | Admitting: Nurse Practitioner

## 2017-06-26 VITALS — BP 123/71 | HR 69 | Temp 98.7°F | Ht 66.5 in | Wt 149.0 lb

## 2017-06-26 DIAGNOSIS — I10 Essential (primary) hypertension: Secondary | ICD-10-CM | POA: Diagnosis not present

## 2017-06-26 DIAGNOSIS — K219 Gastro-esophageal reflux disease without esophagitis: Secondary | ICD-10-CM | POA: Diagnosis not present

## 2017-06-26 DIAGNOSIS — E785 Hyperlipidemia, unspecified: Secondary | ICD-10-CM

## 2017-06-26 NOTE — Progress Notes (Signed)
Subjective:    Patient ID: Holly Navarro, female    DOB: [birth year], 72 y.o.   MRN: [id number]  Holly Navarro is a 72 y.o. female presenting on 06/26/2017 for Hypertension   HPI Hypertension - She is not checking BP at home or outside of clinic.    - Current medications: atenolol, lisinopril, tolerating well without side effects - She is not currently symptomatic. - Pt denies headache, lightheadedness, dizziness, changes in vision, chest tightness/pressure, palpitations, leg swelling, sudden loss of speech or loss of consciousness.  Hyperlipidemia Cholesterol - continues red yeast rice. Pt denies changes in vision, chest tightness/pressure, palpitations, shortness of breath, leg pain while walking, leg or arm weakness, and sudden loss of speech or loss of consciousness.  - She  reports no regular exercise routine, but is regularly active outdoors. - eats a generally healthy diet with lots of fruits and vegetables. Her diet is moderate in salt, moderate in fat, and moderate in carbohydrates.   GERD Patient is stable on current medication without rebound heartburn.  Desires to continue medication.  No reports of bleeding symptoms.  Social History   Tobacco Use  . Smoking status: Current Every Day Smoker    Packs/day: 0.50    Types: Cigarettes  . Smokeless tobacco: Never Used  Substance Use Topics  . Alcohol use: Yes    Alcohol/week: 0.0 oz  . Drug use: No    Review of Systems Per HPI unless specifically indicated above     Objective:    BP 123/71 (BP Location: Right Arm, Patient Position: Sitting, Cuff Size: Normal)   Pulse 69   Temp 98.7 F (37.1 C) (Oral)   Ht 5' 6.5" (1.689 m)   Wt 149 lb (67.6 kg)   BMI 23.69 kg/m   Wt Readings from Last 3 Encounters:  06/26/17 149 lb (67.6 kg)  04/20/17 152 lb 3.2 oz (69 kg)  03/23/17 153 lb (69.4 kg)    Physical Exam  Constitutional: She is oriented to person, place, and time. She appears well-developed and  well-nourished. No distress.  HENT:  Head: Normocephalic and atraumatic.  Neck: Normal range of motion. Neck supple. Carotid bruit is not present.  Cardiovascular: Normal rate, regular rhythm, S1 normal, S2 normal, normal heart sounds and intact distal pulses.  Pulmonary/Chest: Effort normal and breath sounds normal. No respiratory distress.  Abdominal: Soft. Bowel sounds are normal. She exhibits no distension. There is no hepatosplenomegaly. There is no tenderness. No hernia.  Musculoskeletal: She exhibits no edema (pedal).  Neurological: She is alert and oriented to person, place, and time.  Skin: Skin is warm and dry. Capillary refill takes less than 2 seconds.  Psychiatric: She has a normal mood and affect. Her behavior is normal. Judgment and thought content normal.  Vitals reviewed.  Results for orders placed or performed in visit on 04/20/17  COMPLETE METABOLIC PANEL WITH GFR  Result Value Ref Range   Glucose, Bld 90 65 - 99 mg/dL   BUN 17 7 - 25 mg/dL   Creat 8.75 (H) 6.43 - 0.93 mg/dL   GFR, Est Non African American 42 (L) > OR = 60 mL/min/1.30m2   GFR, Est African American 48 (L) > OR = 60 mL/min/1.76m2   BUN/Creatinine Ratio 13 6 - 22 (calc)   Sodium 139 135 - 146 mmol/L   Potassium 4.4 3.5 - 5.3 mmol/L   Chloride 106 98 - 110 mmol/L   CO2 26 20 - 32 mmol/L   Calcium 9.8 8.6 -  10.4 mg/dL   Total Protein 6.5 6.1 - 8.1 g/dL   Albumin 4.1 3.6 - 5.1 g/dL   Globulin 2.4 1.9 - 3.7 g/dL (calc)   AG Ratio 1.7 1.0 - 2.5 (calc)   Total Bilirubin 0.4 0.2 - 1.2 mg/dL   Alkaline phosphatase (APISO) 58 33 - 130 U/L   AST 17 10 - 35 U/L   ALT 14 6 - 29 U/L      Assessment & Plan:   Problem List Items Addressed This Visit      Cardiovascular and Mediastinum   HBP (high blood pressure) - Primary Controlled hypertension.  BP goal < 130/80.  Pt is working on lifestyle modifications.  Taking medications tolerating well without side effects. No currently identified  complications.  Plan: 1. Continue taking atenolol and lisinopril 2. Obtain labs  3. Encouraged heart healthy diet and increasing exercise to 30 minutes most days of the week. 4. Check BP 1-2 x per week at home, keep log, and bring to clinic at next appointment. 5. Follow up 3 months.     Relevant Orders   COMPLETE METABOLIC PANEL WITH GFR (Completed)     Other   Elevated lipids Stable today on exam.  Medications tolerated without side effects.  Continue at current doses.  Encouraged patient to continue keeping an active lifestyle and healthy diet.  Check labs today. Followup 3 months.    Relevant Orders   Lipid panel (Completed)   COMPLETE METABOLIC PANEL WITH GFR (Completed)   TSH (Completed)    Other Visit Diagnoses    Gastroesophageal reflux disease, esophagitis presence not specified     Currently well controlled on pepcid prn 20 mg once daily.  Plan: 1. Continue Pepcid 20 mg bid daily prn. Side effects discussed. Pt wants to continue med. 2. Avoid diet triggers. Reviewed need to seek care if globus sensation, difficulty swallowing, s/sx of GI bleed. 3. Follow up as needed and in 3 mos.    Relevant Orders   CBC with Differential/Platelet (Completed)       Follow up plan: Return in about 3 months (around 09/18/2017) for hypertension, cholesterol AND labs 2-3 days prior.  Holly Mcardle, DNP, AGPCNP-BC Adult Gerontology Primary Care Nurse Practitioner [facility] 06/26/2017, 3:14 PM

## 2017-06-26 NOTE — Patient Instructions (Addendum)
Lyrical A Thayne,   Thank you for coming in to clinic today.  1. Continue all medications without changes.  - YOU can take Pepcid twice daily for 7-14 days and repeat as needed for your stomach pain.   2. You will be due for FASTING BLOOD WORK.  This means you should eat no food or drink after midnight.  Drink only water or coffee without cream/sugar on the morning of your lab visit. - Please go ahead and schedule a "Lab Only" visit in the morning at the clinic for lab draw 3 days before your next office visit. - Your results will be available about 2-3 days after blood draw.  If you have set up a MyChart account, you can can log in to MyChart online to view your results and a brief explanation. Also, we can discuss your results together at your next office visit if you would like.   Please schedule a follow-up appointment with Wilhelmina McardleLauren Christan Defranco, AGNP. Return in about 3 months (around 09/18/2017) for hypertension, cholesterol AND labs 2-3 days prior.   If you have any other questions or concerns, please feel free to call the clinic or send a message through MyChart. You may also schedule an earlier appointment if necessary.  You will receive a survey after today's visit either digitally by e-mail or paper by [employer] mail. Your experiences and feedback matter to us.  Please respond so we know how we are doing as we provide care for you.   Wilhelmina McardleLauren Lemoyne Scarpati, DNP, AGNP-BC Adult Gerontology Nurse Practitioner [facility], [facility]

## 2017-09-12 ENCOUNTER — Other Ambulatory Visit: Payer: Medicare Other

## 2017-09-13 ENCOUNTER — Other Ambulatory Visit: Payer: Medicare Other

## 2017-09-13 DIAGNOSIS — K219 Gastro-esophageal reflux disease without esophagitis: Secondary | ICD-10-CM | POA: Diagnosis not present

## 2017-09-13 DIAGNOSIS — I10 Essential (primary) hypertension: Secondary | ICD-10-CM | POA: Diagnosis not present

## 2017-09-13 DIAGNOSIS — E785 Hyperlipidemia, unspecified: Secondary | ICD-10-CM | POA: Diagnosis not present

## 2017-09-14 LAB — COMPLETE METABOLIC PANEL WITH GFR
AG Ratio: 1.5 (calc) (ref 1.0–2.5)
ALT: 9 U/L (ref 6–29)
AST: 15 U/L (ref 10–35)
Albumin: 3.8 g/dL (ref 3.6–5.1)
Alkaline phosphatase (APISO): 53 U/L (ref 33–130)
BUN/Creatinine Ratio: 12 (calc) (ref 6–22)
BUN: 14 mg/dL (ref 7–25)
CO2: 25 mmol/L (ref 20–32)
Calcium: 9.6 mg/dL (ref 8.6–10.4)
Chloride: 105 mmol/L (ref 98–110)
Creat: 1.2 mg/dL — ABNORMAL HIGH (ref 0.60–0.93)
GFR, Est African American: 52 mL/min/{1.73_m2} — ABNORMAL LOW (ref >=60)
GFR, Est Non African American: 45 mL/min/{1.73_m2} — ABNORMAL LOW (ref >=60)
Globulin: 2.5 g/dL (calc) (ref 1.9–3.7)
Glucose, Bld: 76 mg/dL (ref 65–99)
Potassium: 4.4 mmol/L (ref 3.5–5.3)
Sodium: 141 mmol/L (ref 135–146)
Total Bilirubin: 0.4 mg/dL (ref 0.2–1.2)
Total Protein: 6.3 g/dL (ref 6.1–8.1)

## 2017-09-14 LAB — LIPID PANEL
Cholesterol: 320 mg/dL — ABNORMAL HIGH (ref <200)
HDL: 33 mg/dL — ABNORMAL LOW (ref >50)
LDL Cholesterol (Calc): 221 mg/dL (calc) — ABNORMAL HIGH
Non-HDL Cholesterol (Calc): 287 mg/dL (calc) — ABNORMAL HIGH (ref <130)
Total CHOL/HDL Ratio: 9.7 (calc) — ABNORMAL HIGH (ref <5.0)
Triglycerides: 391 mg/dL — ABNORMAL HIGH (ref <150)

## 2017-09-14 LAB — CBC WITH DIFFERENTIAL/PLATELET
Basophils Absolute: 102 cells/uL (ref 0–200)
Basophils Relative: 1.2 %
Eosinophils Absolute: 264 cells/uL (ref 15–500)
Eosinophils Relative: 3.1 %
HCT: 46.6 % — ABNORMAL HIGH (ref 35.0–45.0)
Hemoglobin: 15.7 g/dL — ABNORMAL HIGH (ref 11.7–15.5)
Lymphs Abs: 3222 cells/uL (ref 850–3900)
MCH: 30.3 pg (ref 27.0–33.0)
MCHC: 33.7 g/dL (ref 32.0–36.0)
MCV: 90 fL (ref 80.0–100.0)
MPV: 11.1 fL (ref 7.5–12.5)
Monocytes Relative: 8.1 %
Neutro Abs: 4225 cells/uL (ref 1500–7800)
Neutrophils Relative %: 49.7 %
Platelets: 279 10*3/uL (ref 140–400)
RBC: 5.18 10*6/uL — ABNORMAL HIGH (ref 3.80–5.10)
RDW: 12.6 % (ref 11.0–15.0)
Total Lymphocyte: 37.9 %
WBC mixed population: 689 cells/uL (ref 200–950)
WBC: 8.5 10*3/uL (ref 3.8–10.8)

## 2017-09-14 LAB — TSH: TSH: 2.66 mIU/L (ref 0.40–4.50)

## 2017-09-15 ENCOUNTER — Encounter: Payer: Self-pay | Admitting: Nurse Practitioner

## 2017-09-19 ENCOUNTER — Ambulatory Visit (INDEPENDENT_AMBULATORY_CARE_PROVIDER_SITE_OTHER): Payer: Medicare Other | Admitting: Nurse Practitioner

## 2017-09-19 ENCOUNTER — Encounter: Payer: Self-pay | Admitting: Nurse Practitioner

## 2017-09-19 VITALS — BP 132/55 | HR 58 | Temp 98.4°F | Ht 65.5 in | Wt 144.4 lb

## 2017-09-19 DIAGNOSIS — E782 Mixed hyperlipidemia: Secondary | ICD-10-CM

## 2017-09-19 DIAGNOSIS — R002 Palpitations: Secondary | ICD-10-CM

## 2017-09-19 DIAGNOSIS — I10 Essential (primary) hypertension: Secondary | ICD-10-CM

## 2017-09-19 MED ORDER — LISINOPRIL 10 MG PO TABS: 10.0000 mg | ORAL_TABLET | Freq: Every day | ORAL | 1 refills | Status: DC

## 2017-09-19 MED ORDER — ATENOLOL 50 MG PO TABS: 50.0000 mg | ORAL_TABLET | Freq: Every day | ORAL | 3 refills | Status: DC

## 2017-09-19 MED ORDER — ROSUVASTATIN CALCIUM 10 MG PO TABS: 10.0000 mg | ORAL_TABLET | ORAL | 1 refills | Status: DC

## 2017-09-19 NOTE — Patient Instructions (Addendum)
Holly Navarro,   Thank you for coming in to clinic today.  1. START Crestor 10 mg tablet.  Take 1/2 tablet one day per week for 2 weeks.  Then, increase to 1 tablet one day per week and continue.  If doing very well without any muscle aches, we will increase to 2 days per week and then 3 days per week.   2. STOP your red yeast rice  3. Continue blood pressure medications without changes.  4. Focus on eating healthy foods in a low glycemic diet to improve your cholesterol.  Please schedule a follow-up appointment with Holly Navarro, AGNP. Return in about 3 months (around 12/19/2017) for cholesterol with labs 2-3 days prior.  If you have any other questions or concerns, please feel free to call the clinic or send a message through MyChart. You may also schedule an earlier appointment if necessary.  You will receive a survey after today's visit either digitally by e-mail or paper by [employer] mail. Your experiences and feedback matter to us.  Please respond so we know how we are doing as we provide care for you.   Holly McardleLauren Jaylin Benzel, DNP, AGNP-BC Adult Gerontology Nurse Practitioner [facility], [facility]   Food Choices to Lower Your Triglycerides Triglycerides are a type of fat in your blood. High levels of triglycerides can increase the risk of heart disease and stroke. If your triglyceride levels are high, the foods you eat and your eating habits are very important. Choosing the right foods can help lower your triglycerides. What general guidelines do I need to follow?  Lose weight if you are overweight.  Limit or avoid alcohol.  Fill one half of your plate with vegetables and green salads.  Limit fruit to two servings a day. Choose fruit instead of juice.  Make one fourth of your plate whole grains. Look for the word "whole" as the first word in the ingredient list.  Fill one fourth of your plate with lean protein foods.  Enjoy fatty fish (such as salmon, mackerel,  sardines, and tuna) three times a week.  Choose healthy fats.  Limit foods high in starch and sugar.  Eat more home-cooked food and less restaurant, buffet, and fast food.  Limit fried foods.  Cook foods using methods other than frying.  Limit saturated fats.  Check ingredient lists to avoid foods with partially hydrogenated oils (trans fats) in them. What foods can I eat? Grains Whole grains, such as whole wheat or whole grain breads, crackers, cereals, and pasta. Unsweetened oatmeal, bulgur, barley, quinoa, or brown rice. Corn or whole wheat flour tortillas. Vegetables Fresh or frozen vegetables (raw, steamed, roasted, or grilled). Green salads. Fruits All fresh, canned (in natural juice), or frozen fruits. Meat and Other Protein Products Ground beef (85% or leaner), grass-fed beef, or beef trimmed of fat. Skinless chicken or [place]. Ground chicken or [place]. Pork trimmed of fat. All fish and seafood. Eggs. Dried beans, peas, or lentils. Unsalted nuts or seeds. Unsalted canned or dry beans. Dairy Low-fat dairy products, such as skim or 1% milk, 2% or reduced-fat cheeses, low-fat ricotta or cottage cheese, or plain low-fat yogurt. Fats and Oils Tub margarines without trans fats. Light or reduced-fat mayonnaise and salad dressings. Avocado. Safflower, olive, or canola oils. Natural peanut or almond butter. The items listed above may not be a complete list of recommended foods or beverages. Contact your dietitian for more options. What foods are not recommended? Grains White bread. White pasta. White rice.  Cornbread. Bagels, pastries, and croissants. Crackers that contain trans fat. Vegetables White potatoes. Corn. Creamed or fried vegetables. Vegetables in a cheese sauce. Fruits Dried fruits. Canned fruit in light or heavy syrup. Fruit juice. Meat and Other Protein Products Fatty cuts of meat. Ribs, chicken wings, bacon, sausage, bologna, salami, chitterlings, fatback, hot dogs,  bratwurst, and packaged luncheon meats. Dairy Whole or 2% milk, cream, half-and-half, and cream cheese. Whole-fat or sweetened yogurt. Full-fat cheeses. Nondairy creamers and whipped toppings. Processed cheese, cheese spreads, or cheese curds. Sweets and Desserts Corn syrup, sugars, honey, and molasses. Candy. Jam and jelly. Syrup. Sweetened cereals. Cookies, pies, cakes, donuts, muffins, and ice cream. Fats and Oils Butter, stick margarine, lard, shortening, ghee, or bacon fat. Coconut, palm kernel, or palm oils. Beverages Alcohol. Sweetened drinks (such as sodas, lemonade, and fruit drinks or punches). The items listed above may not be a complete list of foods and beverages to avoid. Contact your dietitian for more information. This information is not intended to replace advice given to you by your health care provider. Make sure you discuss any questions you have with your health care provider. Document Released: 10/08/2003 Document Revised: 05/28/2015 Document Reviewed: 10/24/2012 Elsevier Interactive Patient Education  2017 [employer].

## 2017-09-19 NOTE — Progress Notes (Signed)
Subjective:    Patient ID: Holly Navarro, female    DOB: 1945/06/18, 72 y.o.   MRN: [id number]  Holly Navarro is a 72 y.o. female presenting on 09/19/2017 for Hypertension and Hyperlipidemia   HPI Hypertension  - She is checking BP at home or outside of clinic.  Readings 110-138/60-70 - Current medications: atenolol 50 mg daily, lisinopril 10 mg daily, tolerating well without side effects - She is not currently symptomatic. - Pt denies headache, lightheadedness, dizziness, changes in vision, chest tightness/pressure, palpitations, leg swelling, sudden loss of speech or loss of consciousness.   Hyperlipidemia - Patient is taking red yeast rice 600 mg caps - takes 1 caps two times daily. - Pt denies changes in vision, chest tightness/pressure, palpitations, shortness of breath, leg pain while walking, leg or arm weakness, and sudden loss of speech or loss of consciousness.   Social History   Tobacco Use  . Smoking status: Current Every Day Smoker    Packs/day: 0.50    Types: Cigarettes  . Smokeless tobacco: Never Used  Substance Use Topics  . Alcohol use: Yes    Alcohol/week: 0.0 standard drinks  . Drug use: No    Review of Systems Per HPI unless specifically indicated above     Objective:    BP (!) 132/55 (BP Location: Left Arm, Patient Position: Sitting, Cuff Size: Normal)   Pulse (!) 58   Temp 98.4 F (36.9 C) (Oral)   Ht 5' 5.5" (1.664 m)   Wt 144 lb 6.4 oz (65.5 kg)   BMI 23.66 kg/m   Wt Readings from Last 3 Encounters:  09/19/17 144 lb 6.4 oz (65.5 kg)  06/26/17 149 lb (67.6 kg)  04/20/17 152 lb 3.2 oz (69 kg)    Physical Exam  Constitutional: She is oriented to person, place, and time. She appears well-developed and well-nourished. No distress.  HENT:  Head: Normocephalic and atraumatic.  Neck: Normal range of motion. Neck supple. Carotid bruit is not present.  Cardiovascular: Normal rate, regular rhythm, S1 normal, S2 normal, normal heart sounds and  intact distal pulses.  Pulmonary/Chest: Effort normal and breath sounds normal. No respiratory distress.  Musculoskeletal: She exhibits no edema (pedal).  Neurological: She is alert and oriented to person, place, and time.  Skin: Skin is warm and dry.  Psychiatric: She has a normal mood and affect. Her behavior is normal.  Vitals reviewed.   Results for orders placed or performed in visit on 06/26/17  Lipid panel  Result Value Ref Range   Cholesterol 320 (H) <200 mg/dL   HDL 33 (L) >40 mg/dL   Triglycerides 981 (H) <150 mg/dL   LDL Cholesterol (Calc) 221 (H) mg/dL (calc)   Total CHOL/HDL Ratio 9.7 (H) <5.0 (calc)   Non-HDL Cholesterol (Calc) 287 (H) <130 mg/dL (calc)  COMPLETE METABOLIC PANEL WITH GFR  Result Value Ref Range   Glucose, Bld 76 65 - 99 mg/dL   BUN 14 7 - 25 mg/dL   Creat 1.91 (H) 4.78 - 0.93 mg/dL   GFR, Est Non African American 45 (L) > OR = 60 mL/min/1.61m2   GFR, Est African American 52 (L) > OR = 60 mL/min/1.69m2   BUN/Creatinine Ratio 12 6 - 22 (calc)   Sodium 141 135 - 146 mmol/L   Potassium 4.4 3.5 - 5.3 mmol/L   Chloride 105 98 - 110 mmol/L   CO2 25 20 - 32 mmol/L   Calcium 9.6 8.6 - 10.4 mg/dL   Total Protein 6.3  6.1 - 8.1 g/dL   Albumin 3.8 3.6 - 5.1 g/dL   Globulin 2.5 1.9 - 3.7 g/dL (calc)   AG Ratio 1.5 1.0 - 2.5 (calc)   Total Bilirubin 0.4 0.2 - 1.2 mg/dL   Alkaline phosphatase (APISO) 53 33 - 130 U/L   AST 15 10 - 35 U/L   ALT 9 6 - 29 U/L  CBC with Differential/Platelet  Result Value Ref Range   WBC 8.5 3.8 - 10.8 Thousand/uL   RBC 5.18 (H) 3.80 - 5.10 Million/uL   Hemoglobin 15.7 (H) 11.7 - 15.5 g/dL   HCT 16.146.6 (H) 09.635.0 - 04.545.0 %   MCV 90.0 80.0 - 100.0 fL   MCH 30.3 27.0 - 33.0 pg   MCHC 33.7 32.0 - 36.0 g/dL   RDW 40.912.6 81.111.0 - 91.415.0 %   Platelets 279 140 - 400 Thousand/uL   MPV 11.1 7.5 - 12.5 fL   Neutro Abs 4,225 1,500 - 7,800 cells/uL   Lymphs Abs 3,222 850 - 3,900 cells/uL   WBC mixed population 689 200 - 950 cells/uL    Eosinophils Absolute 264 15 - 500 cells/uL   Basophils Absolute 102 0 - 200 cells/uL   Neutrophils Relative % 49.7 %   Total Lymphocyte 37.9 %   Monocytes Relative 8.1 %   Eosinophils Relative 3.1 %   Basophils Relative 1.2 %  TSH  Result Value Ref Range   TSH 2.66 0.40 - 4.50 mIU/L      Assessment & Plan:   Problem List Items Addressed This Visit      Cardiovascular and Mediastinum   HBP (high blood pressure)    Controlled hypertension.  BP near goal < 130/80.  Pt is working on lifestyle modifications.  Taking medications tolerating well without side effects. No current complications.  Plan: 1. Continue taking meds without changes- refills provided 2. Obtain labs  3. Encouraged heart healthy diet and increasing exercise to 30 minutes most days of the week. 4. Check BP 1-2 x per week at home, keep log, and bring to clinic at next appointment. 5. Follow up 3 months.        Relevant Medications   lisinopril (PRINIVIL,ZESTRIL) 10 MG tablet   atenolol (TENORMIN) 50 MG tablet   rosuvastatin (CRESTOR) 10 MG tablet     Other   Mixed dyslipidemia - Primary    Stable previously and without new ASCVD events or symptoms today on exam.  Medications tolerated without side effects.  Continue at current doses.  Refills provided.  Check labs today. Followup 3 months.       Relevant Medications   rosuvastatin (CRESTOR) 10 MG tablet   Other Relevant Orders   COMPLETE METABOLIC PANEL WITH GFR   Lipid panel    Other Visit Diagnoses    Palpitations       Relevant Medications   atenolol (TENORMIN) 50 MG tablet      Meds ordered this encounter  Medications  . lisinopril (PRINIVIL,ZESTRIL) 10 MG tablet    Sig: Take 1 tablet (10 mg total) by mouth daily.    Dispense:  90 tablet    Refill:  1    Order Specific Question:   Supervising Provider    Answer:   Smitty CordsKARAMALEGOS, ALEXANDER J [[id number]]  . atenolol (TENORMIN) 50 MG tablet    Sig: Take 1 tablet (50 mg total) by mouth daily.     Dispense:  90 tablet    Refill:  3    Order Specific Question:  Supervising Provider    Answer:   Smitty Cords [[id number]]  . rosuvastatin (CRESTOR) 10 MG tablet    Sig: Take 1 tablet (10 mg total) by mouth once a week.    Dispense:  13 tablet    Refill:  1    Order Specific Question:   Supervising Provider    Answer:   Smitty Cords [[id number]]    Follow up plan: Return in about 3 months (around 12/19/2017) for cholesterol with labs 2-3 days prior.  Wilhelmina Mcardle, DNP, AGPCNP-BC Adult Gerontology Primary Care Nurse Practitioner [facility] 09/19/2017, 2:31 PM

## 2017-11-24 ENCOUNTER — Encounter: Payer: Self-pay | Admitting: Nurse Practitioner

## 2017-11-24 DIAGNOSIS — E782 Mixed hyperlipidemia: Secondary | ICD-10-CM | POA: Insufficient documentation

## 2017-11-24 NOTE — Assessment & Plan Note (Signed)
Stable previously and without new ASCVD events or symptoms today on exam.  Medications tolerated without side effects.  Continue at current doses.  Refills provided.  Check labs today. Followup 3 months.

## 2017-11-24 NOTE — Assessment & Plan Note (Signed)
Controlled hypertension.  BP near goal < 130/80.  Pt is working on lifestyle modifications.  Taking medications tolerating well without side effects. No current complications.  Plan: 1. Continue taking meds without changes- refills provided 2. Obtain labs  3. Encouraged heart healthy diet and increasing exercise to 30 minutes most days of the week. 4. Check BP 1-2 x per week at home, keep log, and bring to clinic at next appointment. 5. Follow up 3 months.

## 2017-12-04 DIAGNOSIS — Z23 Encounter for immunization: Secondary | ICD-10-CM | POA: Diagnosis not present

## 2017-12-05 ENCOUNTER — Telehealth: Payer: Self-pay | Admitting: Nurse Practitioner

## 2017-12-05 NOTE — Telephone Encounter (Signed)
Called to schedule Medicare Annual Wellness Visit with the Nurse Health Advisor.  ° °If patient returns call, please schedule AWV with NHA any date  ° °Thank you! °For any questions please contact: Kathryn Brown [phone] or Skype at: [email or web address]  ° ° °

## 2017-12-11 ENCOUNTER — Other Ambulatory Visit: Payer: Medicare Other

## 2017-12-11 DIAGNOSIS — E782 Mixed hyperlipidemia: Secondary | ICD-10-CM

## 2017-12-12 LAB — COMPLETE METABOLIC PANEL WITH GFR
AG Ratio: 1.3 (calc) (ref 1.0–2.5)
ALT: 12 U/L (ref 6–29)
AST: 16 U/L (ref 10–35)
Albumin: 3.9 g/dL (ref 3.6–5.1)
Alkaline phosphatase (APISO): 58 U/L (ref 33–130)
BUN/Creatinine Ratio: 10 (calc) (ref 6–22)
BUN: 12 mg/dL (ref 7–25)
CO2: 27 mmol/L (ref 20–32)
Calcium: 9.8 mg/dL (ref 8.6–10.4)
Chloride: 106 mmol/L (ref 98–110)
Creat: 1.22 mg/dL — ABNORMAL HIGH (ref 0.60–0.93)
GFR, Est African American: 51 mL/min/{1.73_m2} — ABNORMAL LOW (ref >=60)
GFR, Est Non African American: 44 mL/min/{1.73_m2} — ABNORMAL LOW (ref >=60)
Globulin: 2.9 g/dL (calc) (ref 1.9–3.7)
Glucose, Bld: 89 mg/dL (ref 65–99)
Potassium: 4.3 mmol/L (ref 3.5–5.3)
Sodium: 141 mmol/L (ref 135–146)
Total Bilirubin: 0.5 mg/dL (ref 0.2–1.2)
Total Protein: 6.8 g/dL (ref 6.1–8.1)

## 2017-12-12 LAB — LIPID PANEL
Cholesterol: 236 mg/dL — ABNORMAL HIGH (ref <200)
HDL: 37 mg/dL — ABNORMAL LOW (ref >50)
LDL Cholesterol (Calc): 161 mg/dL (calc) — ABNORMAL HIGH
Non-HDL Cholesterol (Calc): 199 mg/dL (calc) — ABNORMAL HIGH (ref <130)
Total CHOL/HDL Ratio: 6.4 (calc) — ABNORMAL HIGH (ref <5.0)
Triglycerides: 226 mg/dL — ABNORMAL HIGH (ref <150)

## 2017-12-12 NOTE — Telephone Encounter (Signed)
2nd attempt

## 2017-12-14 ENCOUNTER — Ambulatory Visit (INDEPENDENT_AMBULATORY_CARE_PROVIDER_SITE_OTHER): Payer: Medicare Other | Admitting: Nurse Practitioner

## 2017-12-14 ENCOUNTER — Other Ambulatory Visit: Payer: Self-pay

## 2017-12-14 ENCOUNTER — Encounter: Payer: Self-pay | Admitting: Nurse Practitioner

## 2017-12-14 VITALS — BP 124/69 | HR 68 | Temp 98.2°F | Ht 65.5 in | Wt 148.8 lb

## 2017-12-14 DIAGNOSIS — I10 Essential (primary) hypertension: Secondary | ICD-10-CM

## 2017-12-14 DIAGNOSIS — E782 Mixed hyperlipidemia: Secondary | ICD-10-CM

## 2017-12-14 DIAGNOSIS — Z1159 Encounter for screening for other viral diseases: Secondary | ICD-10-CM | POA: Diagnosis not present

## 2017-12-14 DIAGNOSIS — N289 Disorder of kidney and ureter, unspecified: Secondary | ICD-10-CM

## 2017-12-14 MED ORDER — ROSUVASTATIN CALCIUM 10 MG PO TABS
ORAL_TABLET | ORAL | 1 refills | Status: DC
Start: 2017-12-14 — End: 2018-03-23

## 2017-12-14 NOTE — Patient Instructions (Addendum)
Holly Navarro,   Thank you for coming in to clinic today.  1. rosuvastatin 10 mg once daily. Continue increasing from twice weekly to four times weekly if possible.  At any point you have muscle cramps or joint aches, you may stop for 1 week then resume at your previous dose.  Please schedule a follow-up appointment with Wilhelmina McardleLauren Daivd Fredericksen, AGNP. Return in about 3 months (around 03/15/2018) for cholesterol and hypertension.  If you have any other questions or concerns, please feel free to call the clinic or send a message through MyChart. You may also schedule an earlier appointment if necessary.  You will receive a survey after today's visit either digitally by e-mail or paper by [employer] mail. Your experiences and feedback matter to us.  Please respond so we know how we are doing as we provide care for you.   Wilhelmina McardleLauren Laurelle Skiver, DNP, AGNP-BC Adult Gerontology Nurse Practitioner [facility], [facility]

## 2017-12-14 NOTE — Progress Notes (Signed)
Subjective:    Patient ID: Holly Navarro, female    DOB: 1945-09-12, 72 y.o.   MRN: [id number]  Holly Navarro is a 72 y.o. female presenting on 12/14/2017 for Hypertension (discuss recent lab work)   HPI Hypertension - She is not checking BP at home or outside of clinic.    - Current medications: atenolol 50 mg, lisinopril 10 mg, tolerating well without side effects - She is not currently symptomatic. - Pt denies headache, lightheadedness, dizziness, changes in vision, chest tightness/pressure, palpitations, leg swelling, sudden loss of speech or loss of consciousness. - She  reports no regular exercise routine, but keeps a generally active lifestyle.  She admits she is walking more in summer than now. - Her diet is moderate in salt, moderate in fat, and moderate in carbohydrates.  - Patient has renal insufficiency without any changes to patient quality of life since last visit and continues without symptoms.  Hyperlipidemia Patient is taking rosuvastatin 10 mg twice per week currently.  She had hesitations to start statin in general, but was not getting lipid reduction necessary with using red yeast rice.  Was able to convince her to try once weekly, and slowly increase.  Patient reports no side effects since starting rosuvastatin. - Pt denies changes in vision, chest tightness/pressure, palpitations, shortness of breath, leg pain while walking, leg or arm weakness, and sudden loss of speech or loss of consciousness.   Social History   Tobacco Use  . Smoking status: Current Every Day Smoker    Packs/day: 0.50    Types: Cigarettes  . Smokeless tobacco: Never Used  Substance Use Topics  . Alcohol use: Yes    Alcohol/week: 0.0 standard drinks    Comment: occasionally   . Drug use: No   Review of Systems Per HPI unless specifically indicated above     Objective:    BP 124/69 (BP Location: Right Arm, Patient Position: Sitting, Cuff Size: Normal)   Pulse 68   Temp 98.2 F  (36.8 C) (Oral)   Ht 5' 5.5" (1.664 m)   Wt 148 lb 12.8 oz (67.5 kg)   BMI 24.39 kg/m   Wt Readings from Last 3 Encounters:  12/14/17 148 lb 12.8 oz (67.5 kg)  09/19/17 144 lb 6.4 oz (65.5 kg)  06/26/17 149 lb (67.6 kg)    Physical Exam Vitals signs reviewed.  Constitutional:      General: She is not in acute distress.    Appearance: She is well-developed.  HENT:     Head: Normocephalic and atraumatic.     Mouth/Throat:     Mouth: Mucous membranes are moist.     Pharynx: Oropharynx is clear.  Neck:     Musculoskeletal: Normal range of motion and neck supple.     Vascular: No carotid bruit.  Cardiovascular:     Rate and Rhythm: Normal rate and regular rhythm.     Heart sounds: Normal heart sounds, S1 normal and S2 normal.  Pulmonary:     Effort: Pulmonary effort is normal. No respiratory distress.     Breath sounds: Normal breath sounds.  Abdominal:     General: Abdomen is flat. Bowel sounds are normal. There is no distension.     Palpations: Abdomen is soft.  Skin:    General: Skin is warm and dry.     Capillary Refill: Capillary refill takes less than 2 seconds.  Neurological:     General: No focal deficit present.     Mental Status:  She is alert and oriented to person, place, and time. Mental status is at baseline.  Psychiatric:        Mood and Affect: Mood normal.        Behavior: Behavior normal.        Thought Content: Thought content normal.        Judgment: Judgment normal.      Results for orders placed or performed in visit on 12/11/17  Lipid panel  Result Value Ref Range   Cholesterol 236 (H) <200 mg/dL   HDL 37 (L) >16 mg/dL   Triglycerides 109 (H) <150 mg/dL   LDL Cholesterol (Calc) 161 (H) mg/dL (calc)   Total CHOL/HDL Ratio 6.4 (H) <5.0 (calc)   Non-HDL Cholesterol (Calc) 199 (H) <130 mg/dL (calc)  COMPLETE METABOLIC PANEL WITH GFR  Result Value Ref Range   Glucose, Bld 89 65 - 99 mg/dL   BUN 12 7 - 25 mg/dL   Creat 6.04 (H) 5.40 - 0.93 mg/dL     GFR, Est Non African American 44 (L) > OR = 60 mL/min/1.14m2   GFR, Est African American 51 (L) > OR = 60 mL/min/1.46m2   BUN/Creatinine Ratio 10 6 - 22 (calc)   Sodium 141 135 - 146 mmol/L   Potassium 4.3 3.5 - 5.3 mmol/L   Chloride 106 98 - 110 mmol/L   CO2 27 20 - 32 mmol/L   Calcium 9.8 8.6 - 10.4 mg/dL   Total Protein 6.8 6.1 - 8.1 g/dL   Albumin 3.9 3.6 - 5.1 g/dL   Globulin 2.9 1.9 - 3.7 g/dL (calc)   AG Ratio 1.3 1.0 - 2.5 (calc)   Total Bilirubin 0.5 0.2 - 1.2 mg/dL   Alkaline phosphatase (APISO) 58 33 - 130 U/L   AST 16 10 - 35 U/L   ALT 12 6 - 29 U/L      Assessment & Plan:   Problem List Items Addressed This Visit      Cardiovascular and Mediastinum   HBP (high blood pressure) Controlled hypertension.  BP goal < 130/80.  Pt is keeping existing lifestyle modifications.  Taking medications tolerating well without side effects. Complications - CKD stage III stable  Plan: 1. Continue taking atenolol and lisinopril 2. Obtain labs prior to next visit.  Reviewed recent labs with patient today.  3. Encouraged heart healthy diet and increasing exercise to 30 minutes most days of the week. 4. Check BP 1-2 x per week at home, keep log, and bring to clinic at next appointment. 5. Follow up 3 months.     Relevant Medications   rosuvastatin (CRESTOR) 10 MG tablet   Other Relevant Orders   COMPLETE METABOLIC PANEL WITH GFR     Genitourinary   Renal insufficiency Stable renal function over last 3 months.  Patient continues to avoid neprotoxic agents.  Continue hypertension management with lisinopril.  Labs prior to next appointment.  Follow-up 3 months.   Relevant Orders   COMPLETE METABOLIC PANEL WITH GFR     Other   Mixed dyslipidemia Patient with significant improvement of lipids without LDL to goal < 70 at this time.  Patient taking twice weekly rosuvastatin 10 mg.   Tolerating without side effects.  Patient also without any new ASCVD events.   Plan: 1. Increase  rosuvastatin to 3 days weekly x 2 weeks, then 4 days weekly.  Working toward daily at next appointment if tolerated.  Patient verbalizes understanding. 2. Recheck labs prior to next visit. 3. Reinforced lifestyle  modification with low glycemic diet and regular activity. 4. Follow-up 3 months    Relevant Medications   rosuvastatin (CRESTOR) 10 MG tablet   Other Relevant Orders   COMPLETE METABOLIC PANEL WITH GFR   Lipid panel    Other Visit Diagnoses    Encounter for hepatitis C screening test for low risk patient    -  Primary Patient at average to low risk.  No prior screening.  Draw with next labs.   Relevant Orders   Hepatitis C antibody      Meds ordered this encounter  Medications  . rosuvastatin (CRESTOR) 10 MG tablet    Sig: Take one tablet (10 mg) by mouth four days per week.    Dispense:  52 tablet    Refill:  1    Order Specific Question:   Supervising Provider    Answer:   Smitty Cords [[id number]]    Follow up plan: Return in about 3 months (around 03/15/2018) for cholesterol and hypertension.  Wilhelmina Mcardle, DNP, AGPCNP-BC Adult Gerontology Primary Care Nurse Practitioner [facility] 12/14/2017, 2:58 PM

## 2017-12-17 ENCOUNTER — Encounter: Payer: Self-pay | Admitting: Nurse Practitioner

## 2018-03-20 ENCOUNTER — Other Ambulatory Visit: Payer: Self-pay

## 2018-03-20 ENCOUNTER — Other Ambulatory Visit: Payer: Medicare Other

## 2018-03-20 DIAGNOSIS — N289 Disorder of kidney and ureter, unspecified: Secondary | ICD-10-CM

## 2018-03-20 DIAGNOSIS — E782 Mixed hyperlipidemia: Secondary | ICD-10-CM

## 2018-03-20 DIAGNOSIS — Z1159 Encounter for screening for other viral diseases: Secondary | ICD-10-CM | POA: Diagnosis not present

## 2018-03-20 DIAGNOSIS — I10 Essential (primary) hypertension: Secondary | ICD-10-CM | POA: Diagnosis not present

## 2018-03-21 LAB — COMPLETE METABOLIC PANEL WITH GFR
AG Ratio: 1.7 (calc) (ref 1.0–2.5)
ALT: 12 U/L (ref 6–29)
AST: 17 U/L (ref 10–35)
Albumin: 4.3 g/dL (ref 3.6–5.1)
Alkaline phosphatase (APISO): 50 U/L (ref 37–153)
BUN/Creatinine Ratio: 7 (calc) (ref 6–22)
BUN: 8 mg/dL (ref 7–25)
CO2: 24 mmol/L (ref 20–32)
Calcium: 9.7 mg/dL (ref 8.6–10.4)
Chloride: 107 mmol/L (ref 98–110)
Creat: 1.14 mg/dL — ABNORMAL HIGH (ref 0.60–0.93)
GFR, Est African American: 56 mL/min/{1.73_m2} — ABNORMAL LOW (ref >=60)
GFR, Est Non African American: 48 mL/min/{1.73_m2} — ABNORMAL LOW (ref >=60)
Globulin: 2.6 g/dL (calc) (ref 1.9–3.7)
Glucose, Bld: 89 mg/dL (ref 65–99)
Potassium: 3.8 mmol/L (ref 3.5–5.3)
Sodium: 140 mmol/L (ref 135–146)
Total Bilirubin: 0.5 mg/dL (ref 0.2–1.2)
Total Protein: 6.9 g/dL (ref 6.1–8.1)

## 2018-03-21 LAB — HEPATITIS C ANTIBODY
Hepatitis C Ab: NONREACTIVE
SIGNAL TO CUT-OFF: 0.01 (ref <1.00)

## 2018-03-21 LAB — LIPID PANEL
Cholesterol: 183 mg/dL (ref <200)
HDL: 36 mg/dL — ABNORMAL LOW (ref >=50)
LDL Cholesterol (Calc): 114 mg/dL (calc) — ABNORMAL HIGH
Non-HDL Cholesterol (Calc): 147 mg/dL (calc) — ABNORMAL HIGH (ref <130)
Total CHOL/HDL Ratio: 5.1 (calc) — ABNORMAL HIGH (ref <5.0)
Triglycerides: 213 mg/dL — ABNORMAL HIGH (ref <150)

## 2018-03-23 ENCOUNTER — Encounter: Payer: Self-pay | Admitting: Nurse Practitioner

## 2018-03-23 ENCOUNTER — Other Ambulatory Visit: Payer: Self-pay

## 2018-03-23 ENCOUNTER — Ambulatory Visit (INDEPENDENT_AMBULATORY_CARE_PROVIDER_SITE_OTHER): Payer: Medicare Other | Admitting: Nurse Practitioner

## 2018-03-23 VITALS — BP 136/66 | HR 63 | Temp 98.1°F | Ht 65.5 in | Wt 146.4 lb

## 2018-03-23 DIAGNOSIS — I129 Hypertensive chronic kidney disease with stage 1 through stage 4 chronic kidney disease, or unspecified chronic kidney disease: Secondary | ICD-10-CM

## 2018-03-23 DIAGNOSIS — E782 Mixed hyperlipidemia: Secondary | ICD-10-CM | POA: Diagnosis not present

## 2018-03-23 DIAGNOSIS — N183 Chronic kidney disease, stage 3 unspecified: Secondary | ICD-10-CM

## 2018-03-23 DIAGNOSIS — I10 Essential (primary) hypertension: Secondary | ICD-10-CM

## 2018-03-23 DIAGNOSIS — F5104 Psychophysiologic insomnia: Secondary | ICD-10-CM | POA: Diagnosis not present

## 2018-03-23 MED ORDER — TRAZODONE HCL 50 MG PO TABS: 25.0000 mg | ORAL_TABLET | Freq: Every evening | ORAL | 1 refills | Status: DC | PRN

## 2018-03-23 MED ORDER — ROSUVASTATIN CALCIUM 10 MG PO TABS: ORAL_TABLET | ORAL | 1 refills | Status: DC

## 2018-03-23 NOTE — Patient Instructions (Addendum)
Holly Navarro,   Thank you for coming in to clinic today.  1. Continue slowly increasing your rosuvastatin - Take this four days per week (Monday, Wednesday, Friday, Saturday). If tolerated well, may continue increasing to 5 days per week (consider skipping Mon/Thurs or Sun/Wed).    2. STOP alprazolam - Turn in your lorazepam to a law enforcement office to turn in this controlled substance.  3. For your insomnia - START trazodone 25 mg once daily as needed at bedtime.  (Take half of your 50 mg tablet).  4. Continue fish oil 1,000 mg twice daily - this will help you with controlling your triglycerides.  Please schedule a follow-up appointment with Wilhelmina Mcardle, AGNP. Return in about 6 months (around 09/23/2018) for hyperlipidemia, hypertension.  If you have any other questions or concerns, please feel free to call the clinic or send a message through MyChart. You may also schedule an earlier appointment if necessary.  You will receive a survey after today's visit either digitally by e-mail or paper by [employer]. Your experiences and feedback matter to [place].  Please respond so we know how we are doing as we provide care for you.  Wilhelmina Mcardle, DNP, AGNP-BC Adult Gerontology Nurse Practitioner [facility], [facility]

## 2018-03-23 NOTE — Progress Notes (Signed)
Subjective:    Patient ID: Holly Navarro, female    DOB: 1945/05/25, 73 y.o.   MRN: [id number]  Holly Navarro is a 73 y.o. female presenting on 03/23/2018 for Hypertension and Hyperlipidemia   HPI Hypertension - She is not checking BP at home or outside of clinic.    - Current medications: lisinopril 10 mg once daily, atenolol 50 mg once daily, tolerating well without side effects - She is not currently symptomatic. - Pt denies headache, lightheadedness, dizziness, changes in vision, chest tightness/pressure, palpitations, leg swelling, sudden loss of speech or loss of consciousness. - She  reports an exercise routine that includes walking, 4-5 days per week. - Her diet is low in salt, low in fat, and moderate in carbohydrates.   Hyperlipidemia Patient is on every other day dosing.  Has no muscle cramps that are new with second week of this dosing.  Has some cramping, but is same as prior to starting and increasing rosuvastatin. - Pt denies changes in vision, chest tightness/pressure, palpitations, shortness of breath, leg pain while walking, leg or arm weakness, and sudden loss of speech or loss of consciousness.   Insomnia Problems sleeping: Patient took her last alprazolam for sleep (had 2011 Rx and took last dose 2 weeks ago).  This is driven by anxiety.  Has had anxiety about today's visit (anticipation).  Has difficulty sleeping 3-4 times per week.  Has warm cup of coffee at night to help her relax and she can return to sleep.    Social History   Tobacco Use  . Smoking status: Current Every Day Smoker    Packs/day: 0.50    Types: Cigarettes  . Smokeless tobacco: Never Used  Substance Use Topics  . Alcohol use: Yes    Alcohol/week: 0.0 standard drinks    Comment: occasionally   . Drug use: No    Review of Systems Per HPI unless specifically indicated above     Objective:    BP 136/66 (BP Location: Left Arm, Patient Position: Sitting, Cuff Size: Normal)   Pulse 63    Temp 98.1 F (36.7 C) (Oral)   Ht 5' 5.5" (1.664 m)   Wt 146 lb 6.4 oz (66.4 kg)   BMI 23.99 kg/m   Wt Readings from Last 3 Encounters:  03/23/18 146 lb 6.4 oz (66.4 kg)  12/14/17 148 lb 12.8 oz (67.5 kg)  09/19/17 144 lb 6.4 oz (65.5 kg)    Physical Exam Vitals signs reviewed.  Constitutional:      General: She is awake. She is not in acute distress.    Appearance: She is well-developed.  HENT:     Head: Normocephalic and atraumatic.     Mouth/Throat:     Mouth: Mucous membranes are moist.     Pharynx: Oropharynx is clear.  Neck:     Musculoskeletal: Normal range of motion and neck supple.     Vascular: No carotid bruit.  Cardiovascular:     Rate and Rhythm: Normal rate and regular rhythm.     Pulses:          Radial pulses are 2+ on the right side and 2+ on the left side.       Posterior tibial pulses are 1+ on the right side and 1+ on the left side.     Heart sounds: Normal heart sounds, S1 normal and S2 normal.  Pulmonary:     Effort: Pulmonary effort is normal. No respiratory distress.     Breath  sounds: Normal breath sounds and air entry.  Skin:    General: Skin is warm and dry.  Neurological:     Mental Status: She is alert and oriented to person, place, and time.  Psychiatric:        Attention and Perception: Attention normal.        Mood and Affect: Affect normal. Mood is anxious.        Speech: Speech is rapid and pressured and tangential.        Behavior: Behavior normal. Behavior is cooperative.        Thought Content: Thought content normal.        Judgment: Judgment normal.    Results for orders placed or performed in visit on 03/20/18  Hepatitis C antibody  Result Value Ref Range   Hepatitis C Ab NON-REACTIVE NON-REACTI   SIGNAL TO CUT-OFF 0.01 <1.00  Lipid panel  Result Value Ref Range   Cholesterol 183 <200 mg/dL   HDL 36 (L) > OR = 50 mg/dL   Triglycerides 161 (H) <150 mg/dL   LDL Cholesterol (Calc) 114 (H) mg/dL (calc)   Total CHOL/HDL  Ratio 5.1 (H) <5.0 (calc)   Non-HDL Cholesterol (Calc) 147 (H) <130 mg/dL (calc)  COMPLETE METABOLIC PANEL WITH GFR  Result Value Ref Range   Glucose, Bld 89 65 - 99 mg/dL   BUN 8 7 - 25 mg/dL   Creat 0.96 (H) 0.45 - 0.93 mg/dL   GFR, Est Non African American 48 (L) > OR = 60 mL/min/1.47m2   GFR, Est African American 56 (L) > OR = 60 mL/min/1.60m2   BUN/Creatinine Ratio 7 6 - 22 (calc)   Sodium 140 135 - 146 mmol/L   Potassium 3.8 3.5 - 5.3 mmol/L   Chloride 107 98 - 110 mmol/L   CO2 24 20 - 32 mmol/L   Calcium 9.7 8.6 - 10.4 mg/dL   Total Protein 6.9 6.1 - 8.1 g/dL   Albumin 4.3 3.6 - 5.1 g/dL   Globulin 2.6 1.9 - 3.7 g/dL (calc)   AG Ratio 1.7 1.0 - 2.5 (calc)   Total Bilirubin 0.5 0.2 - 1.2 mg/dL   Alkaline phosphatase (APISO) 50 37 - 153 U/L   AST 17 10 - 35 U/L   ALT 12 6 - 29 U/L      Assessment & Plan:   Problem List Items Addressed This Visit      Cardiovascular and Mediastinum   HBP (high blood pressure) Currently well-controlled hypertension.  BP goal < 130/80.  Pt is working on lifestyle modifications.  Taking medications tolerating well without side effects. Complicated by CKD Stage III  Plan: 1. Continue taking meds without changes today 2. Obtain labs prior to next visit.  Recent labs reviewed today and stable or normal.  3. Encouraged heart healthy diet and increasing exercise to 30 minutes most days of the week. 4. Check BP 1-2 x per week at home, keep log, and bring to clinic at next appointment. 5. Follow up 6 months.     Relevant Medications   rosuvastatin (CRESTOR) 10 MG tablet     Genitourinary   Hypertensive kidney disease with CKD stage III Stable on last labs within the last 7 days.  Continue staying well hydrated, avoiding nephrotoxic agents. - Recheck labs in 6 months. - Follow-up 6 months.     Other   Mixed dyslipidemia Improving, but continues to have uncontrolled hyperlipidemia.  Currently taking rosuvastatin every other day and  tolerating well. No ASCVD  events.  Plan: 1. INCREASE rosuvastatin 10 mg to four times weekly every week.  Then if tolerated after 2-4 weeks, increase to 5 times weekly. - CONTINUE OTC fish oil 1 capsule twice daily for hypertriglyceridemia. 2. Discussed low glycemic diet. - handout reinforced 3. Discussed increasing exercise to 30 minutes most days of the week. 4. Follow-up with labs in about 3 months.    Relevant Medications   rosuvastatin (CRESTOR) 10 MG tablet   Other Relevant Orders   Lipid panel   TSH   COMPLETE METABOLIC PANEL WITH GFR    Other Visit Diagnoses    Psychophysiological insomnia    -  Primary Patient with chronic insomnia due to anxiety.  Now with situational worsening given anxiety about coronavirus.  Patient has been taking an old prescription of benzo.   Plan: 1. Reviewed that benzo is non-preferred sleep agent at this time.  New research supports other meds are safer. 2. START trazodone 50 mg tab - take 1/2 tablet at bedtime prn insomnia, anxiety preventing sleep. 3. Follow-up 2-4 weeks prn if no improvement and in 6 months    Relevant Medications   traZODone (DESYREL) 50 MG tablet      Meds ordered this encounter  Medications  . traZODone (DESYREL) 50 MG tablet    Sig: Take 0.5 tablets (25 mg total) by mouth at bedtime as needed for sleep.    Dispense:  30 tablet    Refill:  1    Order Specific Question:   Supervising Provider    Answer:   Smitty Cords [[id number]]  . rosuvastatin (CRESTOR) 10 MG tablet    Sig: Take one tablet (10 mg) by mouth five days per week.    Dispense:  65 tablet    Refill:  1    Order Specific Question:   Supervising Provider    Answer:   Smitty Cords [[id number]]    Follow up plan: Return in about 6 months (around 09/23/2018) for hyperlipidemia, hypertension.  Wilhelmina Mcardle, DNP, AGPCNP-BC Adult Gerontology Primary Care Nurse Practitioner [facility] 03/23/2018,  3:12 PM

## 2018-03-24 ENCOUNTER — Other Ambulatory Visit: Payer: Self-pay | Admitting: Nurse Practitioner

## 2018-03-24 DIAGNOSIS — I10 Essential (primary) hypertension: Secondary | ICD-10-CM

## 2018-09-13 ENCOUNTER — Other Ambulatory Visit: Payer: Self-pay

## 2018-09-13 ENCOUNTER — Other Ambulatory Visit: Payer: Medicare Other

## 2018-09-13 DIAGNOSIS — E782 Mixed hyperlipidemia: Secondary | ICD-10-CM

## 2018-09-14 LAB — LIPID PANEL
Cholesterol: 182 mg/dL (ref <200)
HDL: 36 mg/dL — ABNORMAL LOW (ref >=50)
LDL Cholesterol (Calc): 114 mg/dL (calc) — ABNORMAL HIGH
Non-HDL Cholesterol (Calc): 146 mg/dL (calc) — ABNORMAL HIGH (ref <130)
Total CHOL/HDL Ratio: 5.1 (calc) — ABNORMAL HIGH (ref <5.0)
Triglycerides: 201 mg/dL — ABNORMAL HIGH (ref <150)

## 2018-09-14 LAB — COMPLETE METABOLIC PANEL WITH GFR
AG Ratio: 1.8 (calc) (ref 1.0–2.5)
ALT: 17 U/L (ref 6–29)
AST: 19 U/L (ref 10–35)
Albumin: 4.1 g/dL (ref 3.6–5.1)
Alkaline phosphatase (APISO): 52 U/L (ref 37–153)
BUN/Creatinine Ratio: 9 (calc) (ref 6–22)
BUN: 12 mg/dL (ref 7–25)
CO2: 24 mmol/L (ref 20–32)
Calcium: 9.4 mg/dL (ref 8.6–10.4)
Chloride: 110 mmol/L (ref 98–110)
Creat: 1.39 mg/dL — ABNORMAL HIGH (ref 0.60–0.93)
GFR, Est African American: 43 mL/min/{1.73_m2} — ABNORMAL LOW (ref >=60)
GFR, Est Non African American: 38 mL/min/{1.73_m2} — ABNORMAL LOW (ref >=60)
Globulin: 2.3 g/dL (calc) (ref 1.9–3.7)
Glucose, Bld: 93 mg/dL (ref 65–99)
Potassium: 4.6 mmol/L (ref 3.5–5.3)
Sodium: 142 mmol/L (ref 135–146)
Total Bilirubin: 0.4 mg/dL (ref 0.2–1.2)
Total Protein: 6.4 g/dL (ref 6.1–8.1)

## 2018-09-14 LAB — TSH: TSH: 2.63 mIU/L (ref 0.40–4.50)

## 2018-09-17 ENCOUNTER — Ambulatory Visit (INDEPENDENT_AMBULATORY_CARE_PROVIDER_SITE_OTHER): Payer: Medicare Other | Admitting: Nurse Practitioner

## 2018-09-17 ENCOUNTER — Other Ambulatory Visit: Payer: Self-pay

## 2018-09-17 ENCOUNTER — Encounter: Payer: Self-pay | Admitting: Nurse Practitioner

## 2018-09-17 VITALS — BP 131/81 | HR 57 | Ht 65.5 in | Wt 152.4 lb

## 2018-09-17 DIAGNOSIS — F5101 Primary insomnia: Secondary | ICD-10-CM | POA: Diagnosis not present

## 2018-09-17 DIAGNOSIS — F17200 Nicotine dependence, unspecified, uncomplicated: Secondary | ICD-10-CM | POA: Diagnosis not present

## 2018-09-17 DIAGNOSIS — Z23 Encounter for immunization: Secondary | ICD-10-CM

## 2018-09-17 DIAGNOSIS — R7989 Other specified abnormal findings of blood chemistry: Secondary | ICD-10-CM | POA: Diagnosis not present

## 2018-09-17 DIAGNOSIS — E782 Mixed hyperlipidemia: Secondary | ICD-10-CM | POA: Diagnosis not present

## 2018-09-17 DIAGNOSIS — I10 Essential (primary) hypertension: Secondary | ICD-10-CM

## 2018-09-17 NOTE — Progress Notes (Signed)
Subjective:    Patient ID: Holly Navarro, female    DOB: 05/28/1945, 72 y.o.   MRN: [id number]  Holly Navarro is a 73 y.o. female presenting on 09/17/2018 for Hypertension (hyperlipiemia, pt want to discuss the Shingrix vaccine. )  HPI Hypertension - She is checking BP at home or outside of clinic.  Readings 120-130/65-70s - Current medications: atenolol, lisinpril tolerating well without side effects - She is not currently symptomatic. - Pt denies headache, lightheadedness, dizziness, changes in vision, chest tightness/pressure, palpitations, leg swelling, sudden loss of speech or loss of consciousness. - She  reports an exercise routine that includes walking some, 1-2 days per week. - Her diet is moderate in salt, moderate in fat, and moderate in carbohydrates.   Hyperlipidemia Is currently taking rosuvastatin 10 mg 5 days/week.  She is tolerating this well without myalgias. Pt denies changes in vision, chest tightness/pressure, palpitations, shortness of breath, leg pain while walking, leg or arm weakness, and sudden loss of speech or loss of consciousness.  Shingrix  Patient would like to receive and requests info about how to receive this.  Patient did have chicken pox when little.  Husband had shingles 3 times and patient wants to avoid this.  Insomnia Still wakes tossing/turning.  Does not usually feel rested in am when she wakes.  Tried only taking 25 mg trazodone several nights before stopping due to ineffective medication.  Patient has only recently considered taking whole tablet as recommended at last visit.    Tobacco dependence Cannot reduce below 8-10 cigs per day.  Patient is not currently ready to quit.  Social History   Tobacco Use  . Smoking status: Current Every Day Smoker    Packs/day: 0.50    Types: Cigarettes  . Smokeless tobacco: Never Used  Substance Use Topics  . Alcohol use: Yes    Alcohol/week: 0.0 standard drinks    Comment: occasionally   . Drug  use: No    Review of Systems Per HPI unless specifically indicated above     Objective:    BP 131/81 (BP Location: Right Arm, Patient Position: Sitting, Cuff Size: Normal)   Pulse (!) 57   Ht 5' 5.5" (1.664 m)   Wt 152 lb 6.4 oz (69.1 kg)   BMI 24.97 kg/m   Wt Readings from Last 3 Encounters:  09/17/18 152 lb 6.4 oz (69.1 kg)  03/23/18 146 lb 6.4 oz (66.4 kg)  12/14/17 148 lb 12.8 oz (67.5 kg)    Physical Exam Vitals signs reviewed.  Constitutional:      General: She is awake. She is not in acute distress.    Appearance: She is well-developed.  HENT:     Head: Normocephalic and atraumatic.  Neck:     Musculoskeletal: Normal range of motion and neck supple.     Vascular: No carotid bruit.  Cardiovascular:     Rate and Rhythm: Normal rate and regular rhythm.     Pulses:          Radial pulses are 2+ on the right side and 2+ on the left side.       Posterior tibial pulses are 1+ on the right side and 1+ on the left side.     Heart sounds: Normal heart sounds, S1 normal and S2 normal.  Pulmonary:     Effort: Pulmonary effort is normal. No respiratory distress.     Breath sounds: Normal breath sounds and air entry.  Skin:    General: Skin  is warm and dry.     Capillary Refill: Capillary refill takes less than 2 seconds.  Neurological:     General: No focal deficit present.     Mental Status: She is alert and oriented to person, place, and time. Mental status is at baseline.  Psychiatric:        Attention and Perception: Attention normal.        Mood and Affect: Affect normal. Mood is anxious.        Speech: Speech is rapid and pressured.        Behavior: Behavior normal. Behavior is cooperative.        Thought Content: Thought content normal.        Judgment: Judgment normal.     Results for orders placed or performed in visit on 09/13/18  TSH  Result Value Ref Range   TSH 2.63 0.40 - 4.50 mIU/L  Lipid panel  Result Value Ref Range   Cholesterol 182 <200 mg/dL    HDL 36 (L) > OR = 50 mg/dL   Triglycerides 161201 (H) <150 mg/dL   LDL Cholesterol (Calc) 114 (H) mg/dL (calc)   Total CHOL/HDL Ratio 5.1 (H) <5.0 (calc)   Non-HDL Cholesterol (Calc) 146 (H) <130 mg/dL (calc)  COMPLETE METABOLIC PANEL WITH GFR  Result Value Ref Range   Glucose, Bld 93 65 - 99 mg/dL   BUN 12 7 - 25 mg/dL   Creat 0.961.39 (H) 0.450.60 - 0.93 mg/dL   GFR, Est Non African American 38 (L) > OR = 60 mL/min/1.2373m2   GFR, Est African American 43 (L) > OR = 60 mL/min/1.773m2   BUN/Creatinine Ratio 9 6 - 22 (calc)   Sodium 142 135 - 146 mmol/L   Potassium 4.6 3.5 - 5.3 mmol/L   Chloride 110 98 - 110 mmol/L   CO2 24 20 - 32 mmol/L   Calcium 9.4 8.6 - 10.4 mg/dL   Total Protein 6.4 6.1 - 8.1 g/dL   Albumin 4.1 3.6 - 5.1 g/dL   Globulin 2.3 1.9 - 3.7 g/dL (calc)   AG Ratio 1.8 1.0 - 2.5 (calc)   Total Bilirubin 0.4 0.2 - 1.2 mg/dL   Alkaline phosphatase (APISO) 52 37 - 153 U/L   AST 19 10 - 35 U/L   ALT 17 6 - 29 U/L      Assessment & Plan:   Problem List Items Addressed This Visit      Cardiovascular and Mediastinum   HBP (high blood pressure) - Primary Controlled hypertension.  BP goal < 130/80.  Pt is working on lifestyle modifications.  Taking medications tolerating well without side effects. No current complications.  Plan: 1. Continue taking medications without changes 2. Obtain labs prior to next visit.  Recent labs reviewed  3. Encouraged heart healthy diet and increasing exercise to 30 minutes most days of the week. 4. Check BP 1-2 x per week at home, keep log, and bring to clinic at next appointment. 5. Follow up 6 months.     Relevant Medications   rosuvastatin (CRESTOR) 10 MG tablet   lisinopril (ZESTRIL) 10 MG tablet   atenolol (TENORMIN) 50 MG tablet     Other   Mixed dyslipidemia Controlled today on exam.  Medications tolerated without side effects.  Continue at current doses.  Refills provided.  Pre-visit labs reviewed.  Recheck prior to visit in 6 mos.  Followup 6 months.    Relevant Medications   rosuvastatin (CRESTOR) 10 MG tablet    Other  Visit Diagnoses    Elevated serum creatinine     Repeat BMP for assessment of kidney function.     Relevant Orders   BASIC METABOLIC PANEL WITH GFR   Needs flu shot       Relevant Orders   Flu Vaccine QUAD High Dose(Fluad) (Completed)   Tobacco dependence     Patient not ready to quit.  Cessation encouraged.    Primary insomnia     Stable, but remains uncontrolled.  Insufficient trial of trazodone completed.   - Encouraged ongoing sleep hygiene  - Take trazodone 50 mg once daily at bedtime. Consider alternative if needed. - Follow-up 6 months or sooner prn no improvement.      Meds ordered this encounter  Medications  . rosuvastatin (CRESTOR) 10 MG tablet    Sig: Take one tablet (10 mg) by mouth five days per week.    Dispense:  65 tablet    Refill:  1    Order Specific Question:   Supervising Provider    Answer:   Smitty Cords [[id number]]  . lisinopril (ZESTRIL) 10 MG tablet    Sig: Take 1 tablet (10 mg total) by mouth daily.    Dispense:  90 tablet    Refill:  1    Order Specific Question:   Supervising Provider    Answer:   Smitty Cords [[id number]]  . atenolol (TENORMIN) 50 MG tablet    Sig: Take 1 tablet (50 mg total) by mouth daily.    Dispense:  90 tablet    Refill:  1    Order Specific Question:   Supervising Provider    Answer:   Smitty Cords [[id number]]   Follow up plan: Return in about 6 months (around 03/17/2019) for hypertension, cholesterol.   Holly Mcardle, DNP, AGPCNP-BC Adult Gerontology Primary Care Nurse Practitioner [facility] 09/17/2018, 3:14 PM

## 2018-09-17 NOTE — Patient Instructions (Addendum)
Spenser A Sattler,   Thank you for coming in to clinic today.  1. Shingles vaccine - Shingrix only - two doses at 2-4 months apart. - If not available from local pharmacy, let me know.  We can send you to [facility] outpatient pharmacy in [place].  2. Continue your cholesterol and blood pressure medications without change.  3. You will be due for FASTING BLOOD WORK.  This means you should eat no food or drink after midnight.  Drink only water or coffee without cream/sugar on the morning of your lab visit. - Please go ahead and schedule a "Lab Only" visit in the morning at the clinic for lab draw 3 days before your next office visit. - Your results will be available about 2-3 days after blood draw.  If you have set up a MyChart account, you can can log in to MyChart online to view your results and a brief explanation. Also, we can discuss your results together at your next office visit if you would like.  4. Try whole trazodone.  Please schedule a follow-up appointment with Cassell Smiles, AGNP. Return in about 6 months (around 03/17/2019) for hypertension, cholesterol.  Labs in 3 months and prior to next appointment.  If you have any other questions or concerns, please feel free to call the clinic or send a message through [place]. You may also schedule an earlier appointment if necessary.  You will receive a survey after today's visit either digitally by e-mail or paper by [employer]. Your experiences and feedback matter to [place].  Please respond so we know how we are doing as we provide care for you.   Cassell Smiles, DNP, AGNP-BC Adult Gerontology Nurse Practitioner [place]

## 2018-09-18 MED ORDER — LISINOPRIL 10 MG PO TABS: 10.0000 mg | ORAL_TABLET | Freq: Every day | ORAL | 1 refills | Status: DC

## 2018-09-18 MED ORDER — ROSUVASTATIN CALCIUM 10 MG PO TABS: ORAL_TABLET | ORAL | 1 refills | Status: DC

## 2018-09-18 MED ORDER — ATENOLOL 50 MG PO TABS: 50.0000 mg | ORAL_TABLET | Freq: Every day | ORAL | 1 refills | Status: DC

## 2018-09-24 ENCOUNTER — Ambulatory Visit: Payer: Medicare Other | Admitting: Nurse Practitioner

## 2018-12-07 ENCOUNTER — Other Ambulatory Visit: Payer: Self-pay

## 2018-12-07 DIAGNOSIS — E782 Mixed hyperlipidemia: Secondary | ICD-10-CM

## 2018-12-07 DIAGNOSIS — I10 Essential (primary) hypertension: Secondary | ICD-10-CM

## 2018-12-07 DIAGNOSIS — R7989 Other specified abnormal findings of blood chemistry: Secondary | ICD-10-CM

## 2018-12-12 ENCOUNTER — Other Ambulatory Visit: Payer: Self-pay

## 2018-12-12 ENCOUNTER — Other Ambulatory Visit: Payer: Medicare Other

## 2018-12-12 ENCOUNTER — Other Ambulatory Visit: Payer: Self-pay | Admitting: Nurse Practitioner

## 2018-12-12 DIAGNOSIS — E782 Mixed hyperlipidemia: Secondary | ICD-10-CM

## 2018-12-12 DIAGNOSIS — I10 Essential (primary) hypertension: Secondary | ICD-10-CM

## 2018-12-12 NOTE — Telephone Encounter (Signed)
Pt called requesting refill on    Atenolol  Lisinopril  Crestor

## 2018-12-13 LAB — COMPREHENSIVE METABOLIC PANEL
AG Ratio: 1.7 (calc) (ref 1.0–2.5)
ALT: 14 U/L (ref 6–29)
AST: 15 U/L (ref 10–35)
Albumin: 4.4 g/dL (ref 3.6–5.1)
Alkaline phosphatase (APISO): 55 U/L (ref 37–153)
BUN/Creatinine Ratio: 13 (calc) (ref 6–22)
BUN: 16 mg/dL (ref 7–25)
CO2: 25 mmol/L (ref 20–32)
Calcium: 9.9 mg/dL (ref 8.6–10.4)
Chloride: 107 mmol/L (ref 98–110)
Creat: 1.19 mg/dL — ABNORMAL HIGH (ref 0.60–0.93)
Globulin: 2.6 g/dL (calc) (ref 1.9–3.7)
Glucose, Bld: 90 mg/dL (ref 65–99)
Potassium: 4.3 mmol/L (ref 3.5–5.3)
Sodium: 140 mmol/L (ref 135–146)
Total Bilirubin: 0.5 mg/dL (ref 0.2–1.2)
Total Protein: 7 g/dL (ref 6.1–8.1)

## 2018-12-13 LAB — LIPID PANEL
Cholesterol: 190 mg/dL (ref <200)
HDL: 37 mg/dL — ABNORMAL LOW (ref >=50)
LDL Cholesterol (Calc): 122 mg/dL (calc) — ABNORMAL HIGH
Non-HDL Cholesterol (Calc): 153 mg/dL (calc) — ABNORMAL HIGH (ref <130)
Total CHOL/HDL Ratio: 5.1 (calc) — ABNORMAL HIGH (ref <5.0)
Triglycerides: 190 mg/dL — ABNORMAL HIGH (ref <150)

## 2018-12-13 MED ORDER — ATENOLOL 50 MG PO TABS: 50.0000 mg | ORAL_TABLET | Freq: Every day | ORAL | 1 refills | Status: DC

## 2018-12-13 MED ORDER — LISINOPRIL 10 MG PO TABS: 10.0000 mg | ORAL_TABLET | Freq: Every day | ORAL | 1 refills | Status: DC

## 2018-12-13 MED ORDER — ROSUVASTATIN CALCIUM 10 MG PO TABS: ORAL_TABLET | ORAL | 1 refills | Status: DC

## 2019-03-12 ENCOUNTER — Other Ambulatory Visit: Payer: Self-pay | Admitting: Nurse Practitioner

## 2019-03-12 DIAGNOSIS — I10 Essential (primary) hypertension: Secondary | ICD-10-CM

## 2019-03-12 DIAGNOSIS — E782 Mixed hyperlipidemia: Secondary | ICD-10-CM

## 2019-03-12 MED ORDER — ATENOLOL 50 MG PO TABS: 50.0000 mg | ORAL_TABLET | Freq: Every day | ORAL | 1 refills | Status: DC

## 2019-03-12 MED ORDER — LISINOPRIL 10 MG PO TABS: 10.0000 mg | ORAL_TABLET | Freq: Every day | ORAL | 1 refills | Status: DC

## 2019-03-12 MED ORDER — ROSUVASTATIN CALCIUM 10 MG PO TABS: ORAL_TABLET | ORAL | 1 refills | Status: DC

## 2019-03-12 NOTE — Telephone Encounter (Signed)
Pt is requesting refills on   Atenolol 50 MG Rosuvastaatin 10 MG Lisinopril  10 MG

## 2019-03-27 DIAGNOSIS — I10 Essential (primary) hypertension: Secondary | ICD-10-CM | POA: Diagnosis not present

## 2019-03-27 DIAGNOSIS — E785 Hyperlipidemia, unspecified: Secondary | ICD-10-CM | POA: Diagnosis not present

## 2019-03-27 DIAGNOSIS — G8929 Other chronic pain: Secondary | ICD-10-CM | POA: Diagnosis not present

## 2019-03-27 DIAGNOSIS — Z720 Tobacco use: Secondary | ICD-10-CM | POA: Diagnosis not present

## 2019-05-24 DIAGNOSIS — R69 Illness, unspecified: Secondary | ICD-10-CM | POA: Diagnosis not present

## 2019-05-28 DIAGNOSIS — R69 Illness, unspecified: Secondary | ICD-10-CM | POA: Diagnosis not present

## 2019-08-26 DIAGNOSIS — S70362A Insect bite (nonvenomous), left thigh, initial encounter: Secondary | ICD-10-CM | POA: Diagnosis not present

## 2019-11-19 DIAGNOSIS — R69 Illness, unspecified: Secondary | ICD-10-CM | POA: Diagnosis not present

## 2019-12-03 ENCOUNTER — Other Ambulatory Visit: Payer: Self-pay | Admitting: Family Medicine

## 2019-12-03 ENCOUNTER — Other Ambulatory Visit: Payer: Self-pay | Admitting: Nurse Practitioner

## 2019-12-03 DIAGNOSIS — E782 Mixed hyperlipidemia: Secondary | ICD-10-CM

## 2019-12-03 DIAGNOSIS — I10 Essential (primary) hypertension: Secondary | ICD-10-CM

## 2019-12-03 MED ORDER — ATENOLOL 50 MG PO TABS: 50.0000 mg | ORAL_TABLET | Freq: Every day | ORAL | 0 refills | Status: DC

## 2019-12-03 MED ORDER — LISINOPRIL 10 MG PO TABS: 10.0000 mg | ORAL_TABLET | Freq: Every day | ORAL | 0 refills | Status: DC

## 2019-12-03 MED ORDER — ROSUVASTATIN CALCIUM 10 MG PO TABS: ORAL_TABLET | ORAL | 0 refills | Status: DC

## 2019-12-03 NOTE — Telephone Encounter (Signed)
PT need a refill  atenolol (TENORMIN) 50 MG tablet [[id number]]  lisinopril (ZESTRIL) 10 MG tablet [[id number]]  rosuvastatin (CRESTOR) 10 MG tablet [[id number]]  [facility] [address]), [place] - [address]  [address] [place] [address]  Phone:  [phone] Fax:  [phone]

## 2019-12-04 ENCOUNTER — Other Ambulatory Visit: Payer: Self-pay | Admitting: Family Medicine

## 2019-12-04 DIAGNOSIS — I10 Essential (primary) hypertension: Secondary | ICD-10-CM

## 2019-12-04 DIAGNOSIS — E782 Mixed hyperlipidemia: Secondary | ICD-10-CM

## 2019-12-04 NOTE — Telephone Encounter (Signed)
Future visit 12/11/19 . Courtesy refill last ordered 12/03/19

## 2019-12-11 ENCOUNTER — Encounter: Payer: Self-pay | Admitting: Family Medicine

## 2019-12-11 ENCOUNTER — Ambulatory Visit (INDEPENDENT_AMBULATORY_CARE_PROVIDER_SITE_OTHER): Payer: Self-pay | Admitting: Family Medicine

## 2019-12-11 ENCOUNTER — Other Ambulatory Visit: Payer: Self-pay

## 2019-12-11 VITALS — Resp 17 | Ht 65.5 in | Wt 148.0 lb

## 2019-12-11 DIAGNOSIS — R634 Abnormal weight loss: Secondary | ICD-10-CM

## 2019-12-11 DIAGNOSIS — I10 Essential (primary) hypertension: Secondary | ICD-10-CM | POA: Diagnosis not present

## 2019-12-11 DIAGNOSIS — R7989 Other specified abnormal findings of blood chemistry: Secondary | ICD-10-CM | POA: Diagnosis not present

## 2019-12-11 DIAGNOSIS — E782 Mixed hyperlipidemia: Secondary | ICD-10-CM | POA: Diagnosis not present

## 2019-12-11 MED ORDER — ATENOLOL 50 MG PO TABS: 50.0000 mg | ORAL_TABLET | Freq: Every day | ORAL | 1 refills | Status: DC

## 2019-12-11 MED ORDER — LISINOPRIL 10 MG PO TABS: 10.0000 mg | ORAL_TABLET | Freq: Every day | ORAL | 1 refills | Status: DC

## 2019-12-11 MED ORDER — ROSUVASTATIN CALCIUM 10 MG PO TABS: ORAL_TABLET | ORAL | 1 refills | Status: DC

## 2019-12-11 NOTE — Patient Instructions (Signed)
Have your labs drawn and we will contact you with the results  Try to get exercise a minimum of 30 minutes per day at least 5 days per week as well as  adequate water intake all while measuring blood pressure a few times per week.  Keep a blood pressure log and bring back to clinic at your next visit.  If your readings are consistently over 130/80 to contact our office/send me a MyChart message and we will see you sooner.  Can try DASH and Mediterranean diet options, avoiding processed foods, lowering sodium intake, avoiding pork products, and eating a plant based diet for optimal health.  We will plan to see you back in 6 months for hypertension follow up visit  You will receive a survey after today's visit either digitally by e-mail or paper by [employer] mail. Your experiences and feedback matter to [place].  Please respond so we know how we are doing as we provide care for you.  Call us with any questions/concerns/needs.  It is my goal to be available to you for your health concerns.  Thanks for choosing me to be a partner in your healthcare needs!  Charlaine Dalton, FNP-C Family Nurse Practitioner [facility] Phone: [phone]

## 2019-12-11 NOTE — Progress Notes (Signed)
Subjective:    Patient ID: Holly Navarro, female    DOB: 04-25-1945, 74 y.o.   MRN: [id number]  Holly Navarro is a 74 y.o. female presenting on 12/11/2019 for Hypertension   HPI  Hypertension - She is not checking BP at home or outside of clinic.    - Current medications: lisinopril 10mg  daily and atenolol 50mg  daily, tolerating well without side effects - She is not currently symptomatic. - Pt denies headache, lightheadedness, dizziness, changes in vision, chest tightness/pressure, palpitations, leg swelling, sudden loss of speech or loss of consciousness. - She  reports no regular exercise routine. - Her diet is high in salt, high in fat, and high in carbohydrates.  Depression screen [facility] 2/9 12/11/2019 09/17/2018 03/23/2017  Decreased Interest 0 1 0  Down, Depressed, Hopeless 0 1 0  PHQ - 2 Score 0 2 0  Altered sleeping - - 0  Tired, decreased energy - - 0  Change in appetite - - 0  Feeling bad or failure about yourself  - - 0  Trouble concentrating - - 0  Moving slowly or fidgety/restless - - 0  Suicidal thoughts - - 0  PHQ-9 Score - - 0  Difficult doing work/chores - - Not difficult at all    Social History   Tobacco Use  . Smoking status: Current Every Day Smoker    Packs/day: 0.50    Types: Cigarettes  . Smokeless tobacco: Never Used  Vaping Use  . Vaping Use: Never used  Substance Use Topics  . Alcohol use: Yes    Alcohol/week: 0.0 standard drinks    Comment: occasionally   . Drug use: No    Review of Systems  Constitutional: Negative.   HENT: Negative.   Eyes: Negative.   Respiratory: Negative.   Cardiovascular: Negative.   Gastrointestinal: Negative.   Endocrine: Negative.   Genitourinary: Negative.   Musculoskeletal: Negative.   Skin: Negative.   Allergic/Immunologic: Negative.   Neurological: Negative.   Hematological: Negative.   Psychiatric/Behavioral: Negative.    Per HPI unless specifically indicated above     Objective:    Resp 17    Ht 5' 5.5" (1.664 m)   Wt 148 lb (67.1 kg)   BMI 24.25 kg/m   Wt Readings from Last 3 Encounters:  12/11/19 148 lb (67.1 kg)  09/17/18 152 lb 6.4 oz (69.1 kg)  03/23/18 146 lb 6.4 oz (66.4 kg)    Physical Exam Vitals and nursing note reviewed.  Constitutional:      General: She is not in acute distress.    Appearance: Normal appearance. She is well-developed and well-groomed. She is not ill-appearing or toxic-appearing.  HENT:     Head: Normocephalic and atraumatic.     Nose:     Comments: 09/19/18 is in place, covering mouth and nose. Eyes:     General: Lids are normal. Vision grossly intact.        Right eye: No discharge.        Left eye: No discharge.     Extraocular Movements: Extraocular movements intact.     Conjunctiva/sclera: Conjunctivae normal.     Pupils: Pupils are equal, round, and reactive to light.  Cardiovascular:     Rate and Rhythm: Normal rate and regular rhythm.     Pulses: Normal pulses.     Heart sounds: Normal heart sounds. No murmur heard. No friction rub. No gallop.   Pulmonary:     Effort: Pulmonary effort is normal. No respiratory  distress.     Breath sounds: Normal breath sounds.  Skin:    General: Skin is warm and dry.     Capillary Refill: Capillary refill takes less than 2 seconds.  Neurological:     General: No focal deficit present.     Mental Status: She is alert and oriented to person, place, and time.  Psychiatric:        Attention and Perception: Attention and perception normal.        Mood and Affect: Mood and affect normal.        Speech: Speech normal.        Behavior: Behavior normal. Behavior is cooperative.        Thought Content: Thought content normal.        Cognition and Memory: Cognition and memory normal.        Judgment: Judgment normal.    Results for orders placed or performed in visit on 12/11/19  CBC with Differential  Result Value Ref Range   WBC 8.4 3.8 - 10.8 Thousand/uL   RBC 5.02 3.80 - 5.10 Million/uL    Hemoglobin 15.9 (H) 11.7 - 15.5 g/dL   HCT 67.6 (H) 19.5 - 09.3 %   MCV 92.8 80.0 - 100.0 fL   MCH 31.7 27.0 - 33.0 pg   MCHC 34.1 32.0 - 36.0 g/dL   RDW 26.7 12.4 - 58.0 %   Platelets 276 140 - 400 Thousand/uL   MPV 10.7 7.5 - 12.5 fL   Neutro Abs 5,183 1,500 - 7,800 cells/uL   Lymphs Abs 2,335 850 - 3,900 cells/uL   Absolute Monocytes 647 200 - 950 cells/uL   Eosinophils Absolute 151 15 - 500 cells/uL   Basophils Absolute 84 0 - 200 cells/uL   Neutrophils Relative % 61.7 %   Total Lymphocyte 27.8 %   Monocytes Relative 7.7 %   Eosinophils Relative 1.8 %   Basophils Relative 1.0 %  COMPLETE METABOLIC PANEL WITH GFR  Result Value Ref Range   Glucose, Bld 76 65 - 99 mg/dL   BUN 12 7 - 25 mg/dL   Creat 9.98 (H) 3.38 - 0.93 mg/dL   GFR, Est Non African American 49 (L) > OR = 60 mL/min/1.57m2   GFR, Est African American 57 (L) > OR = 60 mL/min/1.94m2   BUN/Creatinine Ratio 11 6 - 22 (calc)   Sodium 139 135 - 146 mmol/L   Potassium 4.3 3.5 - 5.3 mmol/L   Chloride 105 98 - 110 mmol/L   CO2 27 20 - 32 mmol/L   Calcium 9.8 8.6 - 10.4 mg/dL   Total Protein 7.1 6.1 - 8.1 g/dL   Albumin 4.3 3.6 - 5.1 g/dL   Globulin 2.8 1.9 - 3.7 g/dL (calc)   AG Ratio 1.5 1.0 - 2.5 (calc)   Total Bilirubin 0.7 0.2 - 1.2 mg/dL   Alkaline phosphatase (APISO) 54 37 - 153 U/L   AST 18 10 - 35 U/L   ALT 13 6 - 29 U/L  TSH + free T4  Result Value Ref Range   TSH W/REFLEX TO FT4 2.32 0.40 - 4.50 mIU/L  Lipid Profile  Result Value Ref Range   Cholesterol 208 (H) <200 mg/dL   HDL 39 (L) > OR = 50 mg/dL   Triglycerides 250 (H) <150 mg/dL   LDL Cholesterol (Calc) 126 (H) mg/dL (calc)   Total CHOL/HDL Ratio 5.3 (H) <5.0 (calc)   Non-HDL Cholesterol (Calc) 169 (H) <130 mg/dL (calc)  Assessment & Plan:   Problem List Items Addressed This Visit      Cardiovascular and Mediastinum   Essential hypertension    Controlled hypertension.  BP is at goal < 130/80.  Pt reports working on lifestyle  modifications.  Taking medications tolerating well without side effects.  Complications:  HLD  Plan: 1. Continue taking lisinopril 10mg  daily and atenolol 50mg  daily 2. Obtain labs in the next 1-2 weeks  3. Encouraged heart healthy diet and increasing exercise to 30 minutes most days of the week, going no more than 2 days in a row without exercise. 4. Check BP 1-2 x per week at home, keep log, and bring to clinic at next appointment. 5. Follow up 6 months.       Relevant Medications   atenolol (TENORMIN) 50 MG tablet   lisinopril (ZESTRIL) 10 MG tablet   rosuvastatin (CRESTOR) 10 MG tablet   Other Relevant Orders   CBC with Differential (Completed)   COMPLETE METABOLIC PANEL WITH GFR (Completed)     Other   Mixed dyslipidemia - Primary    Status unknown.  Recheck labs.  Followup after labs.       Relevant Medications   rosuvastatin (CRESTOR) 10 MG tablet   Other Relevant Orders   Lipid Profile (Completed)   Elevated serum creatinine    Status unknown.  Recheck labs.  Followup after labs.       Relevant Orders   COMPLETE METABOLIC PANEL WITH GFR (Completed)    Other Visit Diagnoses    Weight loss       Relevant Orders   TSH + free T4 (Completed)      Meds ordered this encounter  Medications  . atenolol (TENORMIN) 50 MG tablet    Sig: Take 1 tablet (50 mg total) by mouth daily.    Dispense:  90 tablet    Refill:  1  . lisinopril (ZESTRIL) 10 MG tablet    Sig: Take 1 tablet (10 mg total) by mouth daily.    Dispense:  90 tablet    Refill:  1  . rosuvastatin (CRESTOR) 10 MG tablet    Sig: TAKE 1 TABLET BY MOUTH FIVE DAYS PER WEEK    Dispense:  105 tablet    Refill:  1   Follow up plan: Return in about 6 months (around 06/10/2020) for HTN F/U.   , FNP Family Nurse Practitioner [facility] 12/11/2019, 11:28 AM

## 2019-12-12 DIAGNOSIS — R7989 Other specified abnormal findings of blood chemistry: Secondary | ICD-10-CM | POA: Insufficient documentation

## 2019-12-12 LAB — LIPID PANEL
Cholesterol: 208 mg/dL — ABNORMAL HIGH
HDL: 39 mg/dL — ABNORMAL LOW
LDL Cholesterol (Calc): 126 mg/dL — ABNORMAL HIGH
Non-HDL Cholesterol (Calc): 169 mg/dL — ABNORMAL HIGH
Total CHOL/HDL Ratio: 5.3 (calc) — ABNORMAL HIGH
Triglycerides: 281 mg/dL — ABNORMAL HIGH

## 2019-12-12 LAB — COMPLETE METABOLIC PANEL WITH GFR
AG Ratio: 1.5 (calc) (ref 1.0–2.5)
ALT: 13 U/L (ref 6–29)
AST: 18 U/L (ref 10–35)
Albumin: 4.3 g/dL (ref 3.6–5.1)
Alkaline phosphatase (APISO): 54 U/L (ref 37–153)
BUN/Creatinine Ratio: 11 (calc) (ref 6–22)
BUN: 12 mg/dL (ref 7–25)
CO2: 27 mmol/L (ref 20–32)
Calcium: 9.8 mg/dL (ref 8.6–10.4)
Chloride: 105 mmol/L (ref 98–110)
Creat: 1.11 mg/dL — ABNORMAL HIGH (ref 0.60–0.93)
GFR, Est African American: 57 mL/min/{1.73_m2} — ABNORMAL LOW (ref >=60)
GFR, Est Non African American: 49 mL/min/{1.73_m2} — ABNORMAL LOW (ref >=60)
Globulin: 2.8 g/dL (calc) (ref 1.9–3.7)
Glucose, Bld: 76 mg/dL (ref 65–99)
Potassium: 4.3 mmol/L (ref 3.5–5.3)
Sodium: 139 mmol/L (ref 135–146)
Total Bilirubin: 0.7 mg/dL (ref 0.2–1.2)
Total Protein: 7.1 g/dL (ref 6.1–8.1)

## 2019-12-12 LAB — TSH+FREE T4: TSH W/REFLEX TO FT4: 2.32 mIU/L (ref 0.40–4.50)

## 2019-12-12 LAB — CBC WITH DIFFERENTIAL/PLATELET
Absolute Monocytes: 647 cells/uL (ref 200–950)
Basophils Absolute: 84 cells/uL (ref 0–200)
Basophils Relative: 1 %
Eosinophils Absolute: 151 cells/uL (ref 15–500)
Eosinophils Relative: 1.8 %
HCT: 46.6 % — ABNORMAL HIGH (ref 35.0–45.0)
Hemoglobin: 15.9 g/dL — ABNORMAL HIGH (ref 11.7–15.5)
Lymphs Abs: 2335 cells/uL (ref 850–3900)
MCH: 31.7 pg (ref 27.0–33.0)
MCHC: 34.1 g/dL (ref 32.0–36.0)
MCV: 92.8 fL (ref 80.0–100.0)
MPV: 10.7 fL (ref 7.5–12.5)
Monocytes Relative: 7.7 %
Neutro Abs: 5183 cells/uL (ref 1500–7800)
Neutrophils Relative %: 61.7 %
Platelets: 276 10*3/uL (ref 140–400)
RBC: 5.02 10*6/uL (ref 3.80–5.10)
RDW: 12.6 % (ref 11.0–15.0)
Total Lymphocyte: 27.8 %
WBC: 8.4 10*3/uL (ref 3.8–10.8)

## 2019-12-12 NOTE — Assessment & Plan Note (Signed)
Status unknown.  Recheck labs.  Followup after labs.  

## 2019-12-12 NOTE — Assessment & Plan Note (Signed)
Controlled hypertension.  BP is at goal < 130/80.  Pt reports working on lifestyle modifications.  Taking medications tolerating well without side effects.  Complications:  HLD  Plan: 1. Continue taking lisinopril 10mg  daily and atenolol 50mg  daily 2. Obtain labs in the next 1-2 weeks  3. Encouraged heart healthy diet and increasing exercise to 30 minutes most days of the week, going no more than 2 days in a row without exercise. 4. Check BP 1-2 x per week at home, keep log, and bring to clinic at next appointment. 5. Follow up 6 months.

## 2020-02-04 DIAGNOSIS — K8591 Acute pancreatitis with uninfected necrosis, unspecified: Secondary | ICD-10-CM

## 2020-02-04 HISTORY — DX: Acute pancreatitis with uninfected necrosis, unspecified: K85.91

## 2020-02-29 ENCOUNTER — Emergency Department: Payer: Medicare HMO

## 2020-02-29 ENCOUNTER — Inpatient Hospital Stay
Admission: EM | Admit: 2020-02-29 | Discharge: 2020-03-14 | DRG: 438 | Disposition: A | Discharge status: Home Health | Payer: Medicare HMO | Attending: Internal Medicine | Admitting: Internal Medicine

## 2020-02-29 ENCOUNTER — Inpatient Hospital Stay: Payer: Medicare HMO

## 2020-02-29 ENCOUNTER — Other Ambulatory Visit: Payer: Self-pay

## 2020-02-29 DIAGNOSIS — K859 Acute pancreatitis without necrosis or infection, unspecified: Secondary | ICD-10-CM

## 2020-02-29 DIAGNOSIS — Z87898 Personal history of other specified conditions: Secondary | ICD-10-CM

## 2020-02-29 DIAGNOSIS — R680 Hypothermia, not associated with low environmental temperature: Secondary | ICD-10-CM | POA: Diagnosis present

## 2020-02-29 DIAGNOSIS — R6520 Severe sepsis without septic shock: Secondary | ICD-10-CM | POA: Diagnosis present

## 2020-02-29 DIAGNOSIS — K8591 Acute pancreatitis with uninfected necrosis, unspecified: Secondary | ICD-10-CM

## 2020-02-29 DIAGNOSIS — T68XXXA Hypothermia, initial encounter: Secondary | ICD-10-CM | POA: Diagnosis not present

## 2020-02-29 DIAGNOSIS — R110 Nausea: Secondary | ICD-10-CM | POA: Diagnosis not present

## 2020-02-29 DIAGNOSIS — R0902 Hypoxemia: Secondary | ICD-10-CM | POA: Diagnosis not present

## 2020-02-29 DIAGNOSIS — R918 Other nonspecific abnormal finding of lung field: Secondary | ICD-10-CM | POA: Diagnosis present

## 2020-02-29 DIAGNOSIS — R911 Solitary pulmonary nodule: Secondary | ICD-10-CM | POA: Diagnosis not present

## 2020-02-29 DIAGNOSIS — R1110 Vomiting, unspecified: Secondary | ICD-10-CM | POA: Diagnosis not present

## 2020-02-29 DIAGNOSIS — M1990 Unspecified osteoarthritis, unspecified site: Secondary | ICD-10-CM | POA: Diagnosis present

## 2020-02-29 DIAGNOSIS — E8809 Other disorders of plasma-protein metabolism, not elsewhere classified: Secondary | ICD-10-CM | POA: Diagnosis not present

## 2020-02-29 DIAGNOSIS — R140 Abdominal distension (gaseous): Secondary | ICD-10-CM

## 2020-02-29 DIAGNOSIS — Z800 Family history of malignant neoplasm of digestive organs: Secondary | ICD-10-CM

## 2020-02-29 DIAGNOSIS — F17210 Nicotine dependence, cigarettes, uncomplicated: Secondary | ICD-10-CM | POA: Diagnosis not present

## 2020-02-29 DIAGNOSIS — R569 Unspecified convulsions: Secondary | ICD-10-CM

## 2020-02-29 DIAGNOSIS — I5031 Acute diastolic (congestive) heart failure: Secondary | ICD-10-CM | POA: Diagnosis not present

## 2020-02-29 DIAGNOSIS — R509 Fever, unspecified: Secondary | ICD-10-CM | POA: Diagnosis not present

## 2020-02-29 DIAGNOSIS — Z79899 Other long term (current) drug therapy: Secondary | ICD-10-CM

## 2020-02-29 DIAGNOSIS — G928 Other toxic encephalopathy: Secondary | ICD-10-CM | POA: Diagnosis not present

## 2020-02-29 DIAGNOSIS — I714 Abdominal aortic aneurysm, without rupture: Secondary | ICD-10-CM | POA: Diagnosis not present

## 2020-02-29 DIAGNOSIS — R0602 Shortness of breath: Secondary | ICD-10-CM | POA: Diagnosis not present

## 2020-02-29 DIAGNOSIS — I700 Atherosclerosis of aorta: Secondary | ICD-10-CM | POA: Diagnosis not present

## 2020-02-29 DIAGNOSIS — Z66 Do not resuscitate: Secondary | ICD-10-CM | POA: Diagnosis not present

## 2020-02-29 DIAGNOSIS — R739 Hyperglycemia, unspecified: Secondary | ICD-10-CM | POA: Diagnosis not present

## 2020-02-29 DIAGNOSIS — Z20822 Contact with and (suspected) exposure to covid-19: Secondary | ICD-10-CM | POA: Diagnosis present

## 2020-02-29 DIAGNOSIS — R6511 Systemic inflammatory response syndrome (SIRS) of non-infectious origin with acute organ dysfunction: Secondary | ICD-10-CM | POA: Diagnosis not present

## 2020-02-29 DIAGNOSIS — D649 Anemia, unspecified: Secondary | ICD-10-CM | POA: Diagnosis present

## 2020-02-29 DIAGNOSIS — I480 Paroxysmal atrial fibrillation: Secondary | ICD-10-CM | POA: Diagnosis not present

## 2020-02-29 DIAGNOSIS — I10 Essential (primary) hypertension: Secondary | ICD-10-CM | POA: Diagnosis present

## 2020-02-29 DIAGNOSIS — J9811 Atelectasis: Secondary | ICD-10-CM | POA: Diagnosis not present

## 2020-02-29 DIAGNOSIS — E876 Hypokalemia: Secondary | ICD-10-CM | POA: Diagnosis not present

## 2020-02-29 DIAGNOSIS — A419 Sepsis, unspecified organism: Secondary | ICD-10-CM | POA: Diagnosis not present

## 2020-02-29 DIAGNOSIS — I4891 Unspecified atrial fibrillation: Secondary | ICD-10-CM | POA: Diagnosis not present

## 2020-02-29 DIAGNOSIS — R52 Pain, unspecified: Secondary | ICD-10-CM | POA: Diagnosis not present

## 2020-02-29 DIAGNOSIS — R188 Other ascites: Secondary | ICD-10-CM | POA: Diagnosis not present

## 2020-02-29 DIAGNOSIS — I160 Hypertensive urgency: Secondary | ICD-10-CM | POA: Diagnosis not present

## 2020-02-29 DIAGNOSIS — K8592 Acute pancreatitis with infected necrosis, unspecified: Principal | ICD-10-CM | POA: Diagnosis present

## 2020-02-29 DIAGNOSIS — Z8249 Family history of ischemic heart disease and other diseases of the circulatory system: Secondary | ICD-10-CM

## 2020-02-29 DIAGNOSIS — J9601 Acute respiratory failure with hypoxia: Secondary | ICD-10-CM | POA: Diagnosis not present

## 2020-02-29 DIAGNOSIS — J90 Pleural effusion, not elsewhere classified: Secondary | ICD-10-CM | POA: Diagnosis not present

## 2020-02-29 DIAGNOSIS — N179 Acute kidney failure, unspecified: Secondary | ICD-10-CM | POA: Diagnosis not present

## 2020-02-29 DIAGNOSIS — Z833 Family history of diabetes mellitus: Secondary | ICD-10-CM

## 2020-02-29 DIAGNOSIS — E782 Mixed hyperlipidemia: Secondary | ICD-10-CM | POA: Diagnosis not present

## 2020-02-29 DIAGNOSIS — Z841 Family history of disorders of kidney and ureter: Secondary | ICD-10-CM

## 2020-02-29 DIAGNOSIS — E785 Hyperlipidemia, unspecified: Secondary | ICD-10-CM | POA: Diagnosis not present

## 2020-02-29 DIAGNOSIS — R109 Unspecified abdominal pain: Secondary | ICD-10-CM | POA: Diagnosis not present

## 2020-02-29 DIAGNOSIS — Z8711 Personal history of peptic ulcer disease: Secondary | ICD-10-CM

## 2020-02-29 DIAGNOSIS — J189 Pneumonia, unspecified organism: Secondary | ICD-10-CM

## 2020-02-29 DIAGNOSIS — R1084 Generalized abdominal pain: Secondary | ICD-10-CM | POA: Diagnosis not present

## 2020-02-29 LAB — CBC
HCT: 50.2 % — ABNORMAL HIGH (ref 36.0–46.0)
Hemoglobin: 17 g/dL — ABNORMAL HIGH (ref 12.0–15.0)
MCH: 32.1 pg (ref 26.0–34.0)
MCHC: 33.9 g/dL (ref 30.0–36.0)
MCV: 94.7 fL (ref 80.0–100.0)
Platelets: 184 10*3/uL (ref 150–400)
RBC: 5.3 MIL/uL — ABNORMAL HIGH (ref 3.87–5.11)
RDW: 13.1 % (ref 11.5–15.5)
WBC: 16 10*3/uL — ABNORMAL HIGH (ref 4.0–10.5)
nRBC: 0 % (ref 0.0–0.2)

## 2020-02-29 LAB — CBC WITH DIFFERENTIAL/PLATELET
Abs Immature Granulocytes: 0.19 10*3/uL — ABNORMAL HIGH (ref 0.00–0.07)
Basophils Absolute: 0.2 10*3/uL — ABNORMAL HIGH (ref 0.0–0.1)
Basophils Relative: 1 %
Eosinophils Absolute: 0.1 10*3/uL (ref 0.0–0.5)
Eosinophils Relative: 0 %
HCT: 51.9 % — ABNORMAL HIGH (ref 36.0–46.0)
Hemoglobin: 17.5 g/dL — ABNORMAL HIGH (ref 12.0–15.0)
Immature Granulocytes: 1 %
Lymphocytes Relative: 14 %
Lymphs Abs: 4.1 10*3/uL — ABNORMAL HIGH (ref 0.7–4.0)
MCH: 31.9 pg (ref 26.0–34.0)
MCHC: 33.7 g/dL (ref 30.0–36.0)
MCV: 94.7 fL (ref 80.0–100.0)
Monocytes Absolute: 1.6 10*3/uL — ABNORMAL HIGH (ref 0.1–1.0)
Monocytes Relative: 6 %
Neutro Abs: 22.3 10*3/uL — ABNORMAL HIGH (ref 1.7–7.7)
Neutrophils Relative %: 78 %
Platelets: 273 10*3/uL (ref 150–400)
RBC: 5.48 MIL/uL — ABNORMAL HIGH (ref 3.87–5.11)
RDW: 13 % (ref 11.5–15.5)
Smear Review: NORMAL
WBC: 28.5 10*3/uL — ABNORMAL HIGH (ref 4.0–10.5)
nRBC: 0 % (ref 0.0–0.2)

## 2020-02-29 LAB — RESP PANEL BY RT-PCR (FLU A&B, COVID) ARPGX2
Influenza A by PCR: NEGATIVE
Influenza B by PCR: NEGATIVE
SARS Coronavirus 2 by RT PCR: NEGATIVE

## 2020-02-29 LAB — URINALYSIS, COMPLETE (UACMP) WITH MICROSCOPIC
Bacteria, UA: NONE SEEN
Bilirubin Urine: NEGATIVE
Glucose, UA: 50 mg/dL — AB
Ketones, ur: NEGATIVE mg/dL
Leukocytes,Ua: NEGATIVE
Nitrite: NEGATIVE
Protein, ur: 30 mg/dL — AB
Specific Gravity, Urine: 1.027 (ref 1.005–1.030)
pH: 5 (ref 5.0–8.0)

## 2020-02-29 LAB — CBG MONITORING, ED: Glucose-Capillary: 174 mg/dL — ABNORMAL HIGH (ref 70–99)

## 2020-02-29 LAB — BASIC METABOLIC PANEL
Anion gap: 12 (ref 5–15)
Anion gap: 12 (ref 5–15)
BUN: 16 mg/dL (ref 8–23)
BUN: 18 mg/dL (ref 8–23)
CO2: 14 mmol/L — ABNORMAL LOW (ref 22–32)
CO2: 18 mmol/L — ABNORMAL LOW (ref 22–32)
Calcium: 8.6 mg/dL — ABNORMAL LOW (ref 8.9–10.3)
Calcium: 8.1 mg/dL — ABNORMAL LOW (ref 8.9–10.3)
Chloride: 109 mmol/L (ref 98–111)
Chloride: 108 mmol/L (ref 98–111)
Creatinine, Ser: 1.61 mg/dL — ABNORMAL HIGH (ref 0.44–1.00)
Creatinine, Ser: 1.3 mg/dL — ABNORMAL HIGH (ref 0.44–1.00)
GFR, Estimated: 33 mL/min — ABNORMAL LOW (ref >60)
GFR, Estimated: 43 mL/min — ABNORMAL LOW (ref >60)
Glucose, Bld: 232 mg/dL — ABNORMAL HIGH (ref 70–99)
Glucose, Bld: 213 mg/dL — ABNORMAL HIGH (ref 70–99)
Potassium: 5.7 mmol/L — ABNORMAL HIGH (ref 3.5–5.1)
Potassium: 4.4 mmol/L (ref 3.5–5.1)
Sodium: 135 mmol/L (ref 135–145)
Sodium: 138 mmol/L (ref 135–145)

## 2020-02-29 LAB — LACTIC ACID, PLASMA
Lactic Acid, Venous: 6 mmol/L (ref 0.5–1.9)
Lactic Acid, Venous: 5.5 mmol/L (ref 0.5–1.9)
Lactic Acid, Venous: 9.4 mmol/L (ref 0.5–1.9)
Lactic Acid, Venous: 3.1 mmol/L (ref 0.5–1.9)

## 2020-02-29 LAB — URINE DRUG SCREEN, QUALITATIVE (ARMC ONLY)
Amphetamines, Ur Screen: NOT DETECTED
Barbiturates, Ur Screen: NOT DETECTED
Benzodiazepine, Ur Scrn: NOT DETECTED
Cannabinoid 50 Ng, Ur ~~LOC~~: NOT DETECTED
Cocaine Metabolite,Ur ~~LOC~~: NOT DETECTED
MDMA (Ecstasy)Ur Screen: NOT DETECTED
Methadone Scn, Ur: NOT DETECTED
Opiate, Ur Screen: POSITIVE — AB
Phencyclidine (PCP) Ur S: NOT DETECTED
Tricyclic, Ur Screen: NOT DETECTED

## 2020-02-29 LAB — POTASSIUM
Potassium: 5.1 mmol/L (ref 3.5–5.1)
Potassium: 5.1 mmol/L (ref 3.5–5.1)

## 2020-02-29 LAB — BLOOD GAS, ARTERIAL
Acid-base deficit: 12 mmol/L — ABNORMAL HIGH (ref 0.0–2.0)
Allens test (pass/fail): POSITIVE — AB
Bicarbonate: 13.5 mmol/L — ABNORMAL LOW (ref 20.0–28.0)
FIO2: 0.28
O2 Saturation: 91.5 %
Patient temperature: 37
pCO2 arterial: 30 mmHg — ABNORMAL LOW (ref 32.0–48.0)
pH, Arterial: 7.26 — ABNORMAL LOW (ref 7.350–7.450)
pO2, Arterial: 72 mmHg — ABNORMAL LOW (ref 83.0–108.0)

## 2020-02-29 LAB — COMPREHENSIVE METABOLIC PANEL
ALT: 16 U/L (ref 0–44)
AST: 25 U/L (ref 15–41)
Albumin: 4 g/dL (ref 3.5–5.0)
Alkaline Phosphatase: 51 U/L (ref 38–126)
Anion gap: 13 (ref 5–15)
BUN: 14 mg/dL (ref 8–23)
CO2: 20 mmol/L — ABNORMAL LOW (ref 22–32)
Calcium: 8.5 mg/dL — ABNORMAL LOW (ref 8.9–10.3)
Chloride: 112 mmol/L — ABNORMAL HIGH (ref 98–111)
Creatinine, Ser: 1.36 mg/dL — ABNORMAL HIGH (ref 0.44–1.00)
GFR, Estimated: 41 mL/min — ABNORMAL LOW (ref >60)
Glucose, Bld: 195 mg/dL — ABNORMAL HIGH (ref 70–99)
Potassium: 2.4 mmol/L — CL (ref 3.5–5.1)
Sodium: 145 mmol/L (ref 135–145)
Total Bilirubin: 0.5 mg/dL (ref 0.3–1.2)
Total Protein: 6.7 g/dL (ref 6.5–8.1)

## 2020-02-29 LAB — TROPONIN I (HIGH SENSITIVITY)
Troponin I (High Sensitivity): 6 ng/L (ref <18)
Troponin I (High Sensitivity): 8 ng/L (ref <18)

## 2020-02-29 LAB — PROCALCITONIN: Procalcitonin: 1.6 ng/mL

## 2020-02-29 LAB — MAGNESIUM
Magnesium: 2.2 mg/dL (ref 1.7–2.4)
Magnesium: 2 mg/dL (ref 1.7–2.4)

## 2020-02-29 LAB — C-REACTIVE PROTEIN: CRP: 4.6 mg/dL — ABNORMAL HIGH (ref <1.0)

## 2020-02-29 LAB — TRIGLYCERIDES: Triglycerides: 169 mg/dL — ABNORMAL HIGH (ref <150)

## 2020-02-29 LAB — PHOSPHORUS: Phosphorus: 3.6 mg/dL (ref 2.5–4.6)

## 2020-02-29 LAB — MRSA PCR SCREENING: MRSA by PCR: NEGATIVE

## 2020-02-29 LAB — ETHANOL: Alcohol, Ethyl (B): <10 mg/dL (ref <10)

## 2020-02-29 LAB — GLUCOSE, CAPILLARY: Glucose-Capillary: 233 mg/dL — ABNORMAL HIGH (ref 70–99)

## 2020-02-29 LAB — LIPASE, BLOOD: Lipase: 5191 U/L — ABNORMAL HIGH (ref 11–51)

## 2020-02-29 MED ORDER — POTASSIUM CHLORIDE 10 MEQ/100ML IV SOLN
10.0000 meq | INTRAVENOUS | Status: AC
  Administered 2020-02-29 (×4): 10 meq via INTRAVENOUS
  Filled 2020-02-29 (×3): qty 100

## 2020-02-29 MED ORDER — DIPHENHYDRAMINE HCL 12.5 MG/5ML PO ELIX
12.5000 mg | ORAL_SOLUTION | Freq: Four times a day (QID) | ORAL | Status: DC | PRN
  Filled 2020-02-29: qty 5

## 2020-02-29 MED ORDER — SODIUM CHLORIDE 0.9 % IV SOLN
2.0000 g | Freq: Once | INTRAVENOUS | Status: AC
  Administered 2020-02-29: 2 g via INTRAVENOUS
  Filled 2020-02-29: qty 2

## 2020-02-29 MED ORDER — LEVETIRACETAM IN NACL 1000 MG/100ML IV SOLN
1000.0000 mg | Freq: Once | INTRAVENOUS | Status: AC
  Administered 2020-02-29: 1000 mg via INTRAVENOUS
  Filled 2020-02-29 (×2): qty 100

## 2020-02-29 MED ORDER — NALOXONE HCL 0.4 MG/ML IJ SOLN: 0.4000 mg | INTRAMUSCULAR | Status: DC | PRN

## 2020-02-29 MED ORDER — HYDROMORPHONE 1 MG/ML IV SOLN: INTRAVENOUS | Status: DC

## 2020-02-29 MED ORDER — IOHEXOL 300 MG/ML  SOLN
100.0000 mL | Freq: Once | INTRAMUSCULAR | Status: AC | PRN
  Administered 2020-02-29: 100 mL via INTRAVENOUS

## 2020-02-29 MED ORDER — NICARDIPINE HCL IN NACL 20-0.86 MG/200ML-% IV SOLN
INTRAVENOUS | Status: AC
  Administered 2020-02-29: 5 mg/h via INTRAVENOUS
  Filled 2020-02-29: qty 200

## 2020-02-29 MED ORDER — METRONIDAZOLE IN NACL 5-0.79 MG/ML-% IV SOLN
500.0000 mg | Freq: Once | INTRAVENOUS | Status: AC
  Administered 2020-02-29: 500 mg via INTRAVENOUS
  Filled 2020-02-29: qty 100

## 2020-02-29 MED ORDER — SODIUM BICARBONATE 8.4 % IV SOLN
150.0000 meq | Freq: Once | INTRAVENOUS | Status: AC
  Administered 2020-02-29: 150 meq via INTRAVENOUS
  Filled 2020-02-29: qty 150

## 2020-02-29 MED ORDER — CHLORHEXIDINE GLUCONATE CLOTH 2 % EX PADS
6.0000 | MEDICATED_PAD | Freq: Every day | CUTANEOUS | Status: DC
  Administered 2020-02-29 – 2020-03-05 (×6): 6 via TOPICAL
  Filled 2020-02-29: qty 6

## 2020-02-29 MED ORDER — MORPHINE SULFATE (PF) 4 MG/ML IV SOLN
4.0000 mg | Freq: Once | INTRAVENOUS | Status: AC
Start: 2020-02-29 — End: 2020-02-29
  Administered 2020-02-29: 4 mg via INTRAVENOUS
  Filled 2020-02-29: qty 1

## 2020-02-29 MED ORDER — HYDROMORPHONE HCL 1 MG/ML IJ SOLN
0.5000 mg | Freq: Once | INTRAMUSCULAR | Status: AC
  Administered 2020-02-29: 0.5 mg via INTRAVENOUS
  Filled 2020-02-29: qty 1

## 2020-02-29 MED ORDER — ONDANSETRON HCL 4 MG/2ML IJ SOLN
4.0000 mg | Freq: Once | INTRAMUSCULAR | Status: AC
  Administered 2020-02-29: 4 mg via INTRAVENOUS
  Filled 2020-02-29: qty 2

## 2020-02-29 MED ORDER — NICARDIPINE HCL IN NACL 20-0.86 MG/200ML-% IV SOLN
0.0000 mg/h | INTRAVENOUS | Status: DC
  Administered 2020-02-29 (×2): 2.5 mg/h via INTRAVENOUS
  Filled 2020-02-29 (×3): qty 200

## 2020-02-29 MED ORDER — HYDROMORPHONE HCL 1 MG/ML IJ SOLN
0.5000 mg | Freq: Four times a day (QID) | INTRAMUSCULAR | Status: DC | PRN
Start: 2020-02-29 — End: 2020-03-01
  Administered 2020-02-29: 0.5 mg via INTRAVENOUS
  Filled 2020-02-29: qty 1

## 2020-02-29 MED ORDER — LACTATED RINGERS IV BOLUS (SEPSIS)
1250.0000 mL | Freq: Once | INTRAVENOUS | Status: AC
  Administered 2020-02-29: 1250 mL via INTRAVENOUS

## 2020-02-29 MED ORDER — SODIUM CHLORIDE 0.9 % IV BOLUS (SEPSIS)
1000.0000 mL | Freq: Once | INTRAVENOUS | Status: AC
  Administered 2020-02-29: 1000 mL via INTRAVENOUS

## 2020-02-29 MED ORDER — LEVETIRACETAM IN NACL 500 MG/100ML IV SOLN
500.0000 mg | Freq: Two times a day (BID) | INTRAVENOUS | Status: DC
  Administered 2020-02-29 – 2020-03-04 (×8): 500 mg via INTRAVENOUS
  Filled 2020-02-29 (×10): qty 100

## 2020-02-29 MED ORDER — ONDANSETRON HCL 4 MG/2ML IJ SOLN: 4.0000 mg | Freq: Four times a day (QID) | INTRAMUSCULAR | Status: DC | PRN

## 2020-02-29 MED ORDER — LACTATED RINGERS IV SOLN: INTRAVENOUS | Status: AC

## 2020-02-29 MED ORDER — DIPHENHYDRAMINE HCL 50 MG/ML IJ SOLN: 12.5000 mg | Freq: Four times a day (QID) | INTRAMUSCULAR | Status: DC | PRN

## 2020-02-29 MED ORDER — SODIUM CHLORIDE 0.9% FLUSH: 9.0000 mL | INTRAVENOUS | Status: DC | PRN

## 2020-02-29 NOTE — H&P (Addendum)
Name: Holly Navarro MRN: [id number] DOB: 28-Aug-1945     CONSULTATION DATE: 02/29/2020  REFERRING MD :  Ward  CHIEF COMPLAINT:  Acute Pancreatitis   ASSESSMENT AND PLAN  GI:  ACUTE PANCREATITIS: Lab Results  Component Value Date   CREATININE 1.36 (H) 02/29/2020   BUN 14 02/29/2020   NA 145 02/29/2020   K 2.4 (LL) 02/29/2020   CL 112 (H) 02/29/2020   CO2 20 (L) 02/29/2020   Lab Results  Component Value Date   WBC 28.5 (H) 02/29/2020   HGB 17.5 (H) 02/29/2020   HCT 51.9 (H) 02/29/2020   MCV 94.7 02/29/2020   PLT 273 02/29/2020   Lab Results  Component Value Date   LIPASE 5,191 (H) 02/29/2020  No results found for: AMYLASE   SEVERITY: At the present time prior to 48h evaluation: MODERATELY SEVERE  This is based upon imaging and organ dysfunction of [place] and [place] criteria  However, the degree of pancreatitic involvement and tempo warrant special consideration here  At this time favorable characteristics: Age < 75, BMI <30, BUN <20, No pleural effusions  Systemic Inflammatory Response Syndrome (SIRS): Fever >38.0C or hypothermia <36.0C:  YES Tachycardia >90 beats/minute:   YES   Tachypnea >20 breaths/minute:   YES Leukocytosis >12 or leukopoenia <4:   YES  Severity of Pancreatitis: [place] Classification:   Necrotizing    YES   Local complication   YES (peripancratic fluid)  [place]arshall Scoring:    Respiratory:    1 suspect  PFR >300   Renal:     0 creatinine <1.4   CV:     0  SBP>90  The patient presents hemoconcentrated at 51.9HCT, although her baseline appears >46  PLAN:  -No role for surgery or perc drain at this time, there is not a discrete collection that would benefit -There is no obvious intervention required at the level of the gallbladder or duct -Will maintain contact with [facility] for potential need of IR if come to that point  -Pan culture, but with-hold antibiotics at the present time and trend leukocytosis -The empiric use of  antibiotics in acute pancreatitis has not been shown to be of benefit -Ethanol level <10 -Trend Lacitc,  BMP and CBC, monitor renal function and hemoconcentration carefully  -Obtain: Triglycerides, ABG, C-RP, and R-UQ [place] to complement CT -Will order baseline echo, hold home ACEI as possible (although less likely) etiology -Aggressive volume replacement with LR@200cc /hr up front, close titration of rate based on labs -CCM Glycemic control protocol with insulin drip as required -NPO, will need post-pyloric TF beginning tomorrow if possible -Pain management   RESPIRATORY: Was at room air prior to Sz Monitor Check ABG CT without lobar infiltrate or effusion   RENAL:   ACUTE KIDNEY INJURY:  Volume loss and inflammatory response of primary diagnosis Fluid resuscitation Accurate I/O  CT to suffice for present imaging     NEUROLOGY:  -Likely toxic/metabolic -Address hypertension, worry of PRES in the ED, perhaps, but less likely -Post-ictal -Keppra started in the ED, will continue -EEG -CT performed w/o acute finding, MRI when stable  CV:  Hypertensive urgency, on low dose cardene from ED, vigilance for abrupt drop Suspect she is at risk for significant fluid shifts and pressors HOLD ACE for risks of AKI and even pancreatitis Obtain 2D echo ICU monitoring/Tele  ID: -hold IV abx  -follow up cultures -trend WBC  HISTORY  Chief Complaint: abdominal pain   The patient's family and chart provide  history as she is altered.  HPI: Holly Navarro is a 75 y.o. female with history of hypertension, hyperlipidemia, gastric ulcer who presented to [facility] emergency department for sudden onset diffuse severe sharp abdominal pain. The discomfort began  Around 10 PM last night and was associated with onset of nausea and cramps. She did vomit and became diaphoretic after emesis. The pain did radiate across her entire abdomen and to her back. There was no associated diarrhea or lose stool  and no hematochezia or hematemesis. She was found to be hypothermic and hypertensive upon her arrival she was warmed and started on fluids. Her evaluation returned a lactic acid of 6. There was a leukocytosis in excess of 20. A CT revealed necrotizing pancreatitis with a corroborating lipase. The case was discussed with our local surgeon who felt she may be best served in transfer. The case was then d/w with [facility] Intensivist who explained there were no beds and without acute intervention planned, she would likely need to wait and be stabilized at [facility].  Case d/w the patient's son [name]: [phone], he will mobilize family for discussion of her care and visitation from out of state ([place] and [place]).   Past Medical History:  Diagnosis Date  . Anxiety   . Arthritis   . Cardiac arrhythmia   . Hyperlipidemia   . Stomach ulcer     Past Surgical History:  Procedure Laterality Date  . CYSTECTOMY      Family History  Problem Relation Age of Onset  . Heart disease Mother   . Kidney disease Mother   . Diabetes Father   . Cancer Brother        pencreatic   Social History:  reports that she has been smoking cigarettes. She has been smoking about 0.50 packs per day. She has never used smokeless tobacco. She reports current alcohol use. She reports that she does not use drugs.  Allergies: No Known Allergies  (Not in a hospital admission)   Results for orders placed or performed during the hospital encounter of 02/29/20 (from the past 48 hour(s))  CBC with Differential     Status: Abnormal   Collection Time: 02/29/20  1:10 AM  Result Value Ref Range   WBC 28.5 (H) 4.0 - 10.5 K/uL   RBC 5.48 (H) 3.87 - 5.11 MIL/uL   Hemoglobin 17.5 (H) 12.0 - 15.0 g/dL   HCT 09.8 (H) 11.9 - 14.7 %   MCV 94.7 80.0 - 100.0 fL   MCH 31.9 26.0 - 34.0 pg   MCHC 33.7 30.0 - 36.0 g/dL   RDW 82.9 56.2 - 13.0 %   Platelets 273 150 - 400 K/uL   nRBC 0.0 0.0 - 0.2 %   Neutrophils  Relative % 78 %   Neutro Abs 22.3 (H) 1.7 - 7.7 K/uL   Lymphocytes Relative 14 %   Lymphs Abs 4.1 (H) 0.7 - 4.0 K/uL   Monocytes Relative 6 %   Monocytes Absolute 1.6 (H) 0.1 - 1.0 K/uL   Eosinophils Relative 0 %   Eosinophils Absolute 0.1 0.0 - 0.5 K/uL   Basophils Relative 1 %   Basophils Absolute 0.2 (H) 0.0 - 0.1 K/uL   WBC Morphology MORPHOLOGY UNREMARKABLE    RBC Morphology MORPHOLOGY UNREMARKABLE    Smear Review Normal platelet morphology    Immature Granulocytes 1 %   Abs Immature Granulocytes 0.19 (H) 0.00 - 0.07 K/uL    Comment: Performed at [facility], [address] [place], [place] [address]  Comprehensive metabolic panel     Status: Abnormal   Collection Time: 02/29/20  1:10 AM  Result Value Ref Range   Sodium 145 135 - 145 mmol/L   Potassium 2.4 (LL) 3.5 - 5.1 mmol/L    Comment: CRITICAL RESULT CALLED TO, READ BACK BY AND VERIFIED WITH  MEGAN CHANEY 02/29/20 0450 ADL    Chloride 112 (H) 98 - 111 mmol/L   CO2 20 (L) 22 - 32 mmol/L   Glucose, Bld 195 (H) 70 - 99 mg/dL    Comment: Glucose reference range applies only to samples taken after fasting for at least 8 hours.   BUN 14 8 - 23 mg/dL   Creatinine, Ser 7.06 (H) 0.44 - 1.00 mg/dL   Calcium 8.5 (L) 8.9 - 10.3 mg/dL   Total Protein 6.7 6.5 - 8.1 g/dL   Albumin 4.0 3.5 - 5.0 g/dL   AST 25 15 - 41 U/L   ALT 16 0 - 44 U/L   Alkaline Phosphatase 51 38 - 126 U/L   Total Bilirubin 0.5 0.3 - 1.2 mg/dL   GFR, Estimated 41 (L) >60 mL/min    Comment: (NOTE) Calculated using the CKD-EPI Creatinine Equation (2021)    Anion gap 13 5 - 15    Comment: Performed at [facility], [address] [place], [place] [address]  Lipase, blood     Status: Abnormal   Collection Time: 02/29/20  1:10 AM  Result Value Ref Range   Lipase 5,191 (H) 11 - 51 U/L    Comment: RESULT CONFIRMED BY MANUAL DILUTION ADL Performed at [facility], [address], [place], [place] [address]   Troponin I (High  Sensitivity)     Status: None   Collection Time: 02/29/20  1:10 AM  Result Value Ref Range   Troponin I (High Sensitivity) 6 <18 ng/L    Comment: (NOTE) Elevated high sensitivity troponin I (hsTnI) values and significant  changes across serial measurements may suggest ACS but many other  chronic and acute conditions are known to elevate hsTnI results.  Refer to the "Links" section for chest pain algorithms and additional  guidance. Performed at [facility], [address] [place], [place] [address]   Magnesium     Status: None   Collection Time: 02/29/20  1:10 AM  Result Value Ref Range   Magnesium 2.2 1.7 - 2.4 mg/dL    Comment: Performed at [facility], [address] [place], [place] [address]  Urinalysis, Complete w Microscopic Urine, Catheterized     Status: Abnormal   Collection Time: 02/29/20  2:46 AM  Result Value Ref Range   Color, Urine STRAW (A) YELLOW   APPearance CLEAR (A) CLEAR   Specific Gravity, Urine 1.027 1.005 - 1.030   pH 5.0 5.0 - 8.0   Glucose, UA 50 (A) NEGATIVE mg/dL   Hgb urine dipstick SMALL (A) NEGATIVE   Bilirubin Urine NEGATIVE NEGATIVE   Ketones, ur NEGATIVE NEGATIVE mg/dL   Protein, ur 30 (A) NEGATIVE mg/dL   Nitrite NEGATIVE NEGATIVE   Leukocytes,Ua NEGATIVE NEGATIVE   WBC, UA 0-5 0 - 5 WBC/hpf   Bacteria, UA NONE SEEN NONE SEEN   Squamous Epithelial / LPF 0-5 0 - 5   Mucus PRESENT    Hyaline Casts, UA PRESENT     Comment: Performed at [facility], [address], [place], [place] [address]  Troponin I (High Sensitivity)     Status: None  Collection Time: 02/29/20  2:53 AM  Result Value Ref Range   Troponin I (High Sensitivity) 8 <18 ng/L    Comment: (NOTE) Elevated high sensitivity troponin I (hsTnI) values and significant  changes across serial measurements may suggest ACS but many other  chronic and acute conditions are known to elevate hsTnI results.  Refer to the "Links" section for chest pain  algorithms and additional  guidance. Performed at [facility], [address] [place], [place] [address]   Lactic acid, plasma     Status: Abnormal   Collection Time: 02/29/20  2:53 AM  Result Value Ref Range   Lactic Acid, Venous 6.0 (HH) 0.5 - 1.9 mmol/L    Comment: CRITICAL RESULT CALLED TO, READ BACK BY AND VERIFIED WITH  MEGAN CHANEY 02/29/20 @ 0316 ADL Performed at [facility], [address] [place], [place] [address]   Resp Panel by RT-PCR (Flu A&B, Covid) Nasopharyngeal Swab     Status: None   Collection Time: 02/29/20  2:53 AM   Specimen: Nasopharyngeal Swab; Nasopharyngeal(NP) swabs in vial transport medium  Result Value Ref Range   SARS Coronavirus 2 by RT PCR NEGATIVE NEGATIVE    Comment: (NOTE) SARS-CoV-2 target nucleic acids are NOT DETECTED.  The SARS-CoV-2 RNA is generally detectable in upper respiratory specimens during the acute phase of infection. The lowest concentration of SARS-CoV-2 viral copies this assay can detect is 138 copies/mL. A negative result does not preclude SARS-Cov-2 infection and should not be used as the sole basis for treatment or other patient management decisions. A negative result may occur with  improper specimen collection/handling, submission of specimen other than nasopharyngeal swab, presence of viral mutation(s) within the areas targeted by this assay, and inadequate number of viral copies(<138 copies/mL). A negative result must be combined with clinical observations, patient history, and epidemiological information. The expected result is Negative.  Fact Sheet for Patients:  BloggerCourse.com  Fact Sheet for Healthcare Providers:  SeriousBroker.it  This test is no t yet approved or cleared by the [place] FDA and  has been authorized for detection and/or diagnosis of SARS-CoV-2 by FDA under an Emergency Use Authorization (EUA). This EUA will remain   in effect (meaning this test can be used) for the duration of the COVID-19 declaration under Section 564(b)(1) of the Act, 21 U.S.C.section 360bbb-3(b)(1), unless the authorization is terminated  or revoked sooner.       Influenza A by PCR NEGATIVE NEGATIVE   Influenza B by PCR NEGATIVE NEGATIVE    Comment: (NOTE) The Xpert Xpress SARS-CoV-2/FLU/RSV plus assay is intended as an aid in the diagnosis of influenza from Nasopharyngeal swab specimens and should not be used as a sole basis for treatment. Nasal washings and aspirates are unacceptable for Xpert Xpress SARS-CoV-2/FLU/RSV testing.  Fact Sheet for Patients: BloggerCourse.com  Fact Sheet for Healthcare Providers: SeriousBroker.it  This test is not yet approved or cleared by the [place] FDA and has been authorized for detection and/or diagnosis of SARS-CoV-2 by FDA under an Emergency Use Authorization (EUA). This EUA will remain in effect (meaning this test can be used) for the duration of the COVID-19 declaration under Section 564(b)(1) of the Act, 21 U.S.C. section 360bbb-3(b)(1), unless the authorization is terminated or revoked.  Performed at [facility], [address] [place], [place] [address]   Culture, blood (Routine X 2) w Reflex to ID Panel     Status: None (Preliminary result)   Collection Time: 02/29/20  3:26 AM  Specimen: BLOOD  Result Value Ref Range   Specimen Description BLOOD RIGHT ANTECUBITAL    Special Requests      BOTTLES DRAWN AEROBIC AND ANAEROBIC Blood Culture adequate volume   Culture      NO GROWTH < 12 HOURS Performed at [facility], [address], [place], [place] [address]    Report Status PENDING   Culture, blood (Routine X 2) w Reflex to ID Panel     Status: None (Preliminary result)   Collection Time: 02/29/20  3:43 AM   Specimen: BLOOD  Result Value Ref Range   Specimen Description BLOOD BLOOD RIGHT  HAND    Special Requests      BOTTLES DRAWN AEROBIC AND ANAEROBIC Blood Culture results may not be optimal due to an inadequate volume of blood received in culture bottles   Culture      NO GROWTH < 12 HOURS Performed at [facility], [address], [place], [place] [address]    Report Status PENDING   Lactic acid, plasma     Status: Abnormal   Collection Time: 02/29/20  4:55 AM  Result Value Ref Range   Lactic Acid, Venous 5.5 (HH) 0.5 - 1.9 mmol/L    Comment: CRITICAL VALUE NOTED. VALUE IS CONSISTENT WITH PREVIOUSLY REPORTED/CALLED VALUE  ADL Performed at [facility], [address] [place], [place] [address]   CBG monitoring, ED     Status: Abnormal   Collection Time: 02/29/20  6:28 AM  Result Value Ref Range   Glucose-Capillary 174 (H) 70 - 99 mg/dL    Comment: Glucose reference range applies only to samples taken after fasting for at least 8 hours.  Urine Drug Screen, Qualitative ([facility] only)     Status: Abnormal   Collection Time: 02/29/20  6:30 AM  Result Value Ref Range   Tricyclic, Ur Screen NONE DETECTED NONE DETECTED   Amphetamines, Ur Screen NONE DETECTED NONE DETECTED   MDMA (Ecstasy)Ur Screen NONE DETECTED NONE DETECTED   Cocaine Metabolite,Ur [place] NONE DETECTED NONE DETECTED   Opiate, Ur Screen POSITIVE (A) NONE DETECTED   Phencyclidine (PCP) Ur S NONE DETECTED NONE DETECTED   Cannabinoid 50 Ng, Ur [place] NONE DETECTED NONE DETECTED   Barbiturates, Ur Screen NONE DETECTED NONE DETECTED   Benzodiazepine, Ur Scrn NONE DETECTED NONE DETECTED   Methadone Scn, Ur NONE DETECTED NONE DETECTED    Comment: (NOTE) Tricyclics + metabolites, urine    Cutoff 1000 ng/mL Amphetamines + metabolites, urine  Cutoff 1000 ng/mL MDMA (Ecstasy), urine              Cutoff 500 ng/mL Cocaine Metabolite, urine          Cutoff 300 ng/mL Opiate + metabolites, urine        Cutoff 300 ng/mL Phencyclidine (PCP), urine         Cutoff 25 ng/mL Cannabinoid, urine                  Cutoff 50 ng/mL Barbiturates + metabolites, urine  Cutoff 200 ng/mL Benzodiazepine, urine              Cutoff 200 ng/mL Methadone, urine                   Cutoff 300 ng/mL  The urine drug screen provides only a preliminary, unconfirmed analytical test result and should not be used for non-medical purposes. Clinical consideration and professional judgment should be applied to any positive drug screen result due  to possible interfering substances. A more specific alternate chemical method must be used in order to obtain a confirmed analytical result. Gas chromatography / mass spectrometry (GC/MS) is the preferred confirm atory method. Performed at [facility], [address] [place], [place] [address]   Ethanol     Status: None   Collection Time: 02/29/20  6:31 AM  Result Value Ref Range   Alcohol, Ethyl (B) <10 <10 mg/dL    Comment: (NOTE) Lowest detectable limit for serum alcohol is 10 mg/dL.  For medical purposes only. Performed at [facility], [address] [place], [place] [address]    DG Abd 1 View  Result Date: 02/29/2020 CLINICAL DATA:  Sepsis EXAM: ABDOMEN - 1 VIEW COMPARISON:  None. FINDINGS: Normal abdominal gas pattern. Inferior pelvis excluded from view. No definite gross free intraperitoneal gas. No organomegaly. Vascular calcifications are seen within the abdominal aorta. Osseous structures are age-appropriate. IMPRESSION: Negative. Electronically Signed   By: Helyn NumbersAshesh  Parikh MD   On: 02/29/2020 03:42   CT Head Wo Contrast  Result Date: 02/29/2020 CLINICAL DATA:  Seizure, vomiting EXAM: CT HEAD WITHOUT CONTRAST TECHNIQUE: Contiguous axial images were obtained from the base of the skull through the vertex without intravenous contrast. COMPARISON:  None. FINDINGS: Brain: Normal anatomic configuration. No abnormal intra or extra-axial mass lesion or fluid collection. No abnormal mass effect or midline shift. No evidence of acute intracranial  hemorrhage or infarct. Ventricular size is normal. Cerebellum unremarkable. Vascular: Unremarkable Skull: Intact Sinuses/Orbits: Paranasal sinuses are clear. Orbits are unremarkable. Other: Mastoid air cells and middle ear cavities are clear. IMPRESSION: No acute intracranial abnormality.  Normal examination. Electronically Signed   By: Helyn NumbersAshesh  Parikh MD   On: 02/29/2020 06:48   CT ABDOMEN PELVIS W CONTRAST  Result Date: 02/29/2020 CLINICAL DATA:  75 year old female with sudden onset abdominal pain tonight, vomiting. Known peptic ulcer disease. EXAM: CT ABDOMEN AND PELVIS WITH CONTRAST TECHNIQUE: Multidetector CT imaging of the abdomen and pelvis was performed using the standard protocol following bolus administration of intravenous contrast. CONTRAST:  100mL OMNIPAQUE IOHEXOL 300 MG/ML  SOLN COMPARISON:  None. FINDINGS: Lower chest: No cardiomegaly or pericardial effusion. Bulky soft and calcified plaque in the descending thoracic aorta. No pleural effusion. Mild left greater than right lung base opacity favoring atelectasis or scarring. Underlying centrilobular emphysema is apparent. Hepatobiliary: Small volume perihepatic free fluid at the liver dome. Simple fluid density. Liver enhancement remains normal. Moderate free fluid at the porta hepatis and along the inferior liver contour. Gallbladder remains normal. No bile duct enlargement. Pancreas: Highly abnormal. It appears that most of the enlarged and indistinct pancreatic parenchyma is nonenhancing, with a small island of preserved parenchymal enhancement at the head or uncinate (series 2, image 32 and coronal image 36). Confluent surrounding inflammation, both in the lesser sac and tracking in the upper abdomen left greater than right. Moderate volume patchy upper abdominal free fluid. Fluid layering in both gutters greater on the left. Simple fluid density. No organized or rim enhancing fluid collection. Spleen: Preserved enhancement. Regional free fluid  and inflammation. Adrenals/Urinary Tract: Adrenal glands remain normal. Anterior and lateral pararenal fluid and edema but renal enhancement and contrast excretion remains normal. Normal proximal ureters. No renal mass or nephrolithiasis. Mildly distended but otherwise unremarkable urinary bladder. Stomach/Bowel: Redundant large bowel, especially the sigmoid colon. No dilated or inflamed large bowel. Normal retrocecal appendix (series 2, image 57). Negative terminal ileum. No dilated small bowel. Secondary inflammation of the stomach and duodenum.  No free air. Small superimposed gastric hiatal hernia. Vascular/Lymphatic: Extensive Calcified aortic atherosclerosis. Mild infrarenal abdominal aortic aneurysm, 31 mm diameter (series 2, image 47). Extensive iliac artery atherosclerosis. Major arterial structures remain patent, including the splenic artery. Portal venous system including the splenic vein remains patent. No lymphadenopathy. Reproductive: Negative. Other: No pelvic free fluid. Musculoskeletal: Levoconvex lumbar scoliosis with multilevel advanced disc and endplate degeneration in the spine. Grade 1 anterolisthesis at L4-L5. Associated lower lumbar severe facet arthropathy. No acute osseous abnormality identified. IMPRESSION: 1. Severe Acute Pancreatitis, with subtotal associated Pancreatic Necrosis. 2. Moderate volume free fluid and inflammation throughout the upper abdomen. No organized or rim enhancing fluid collection at this time. No vascular occlusion or other complicating features. 3. Infrarenal abdominal aortic aneurysm measuring 31 mm. Recommend follow-up Ultrasound every 3 years. This recommendation follows ACR consensus guidelines: White Paper of the ACR Incidental Findings Committee II on Vascular Findings. J Am Coll Radiol 2013; 16:109-604. 4. Aortic Atherosclerosis (ICD10-I70.0) and Emphysema (ICD10-J43.9). Electronically Signed   By: Odessa Fleming M.D.   On: 02/29/2020 05:58   DG Chest Portable 1  View  Result Date: 02/29/2020 CLINICAL DATA:  Sepsis, peptic ulcer disease EXAM: PORTABLE CHEST 1 VIEW COMPARISON:  None. FINDINGS: The lungs are symmetrically well expanded. Benign calcified granuloma within the right mid lung zone. Nodular density at the left lung base likely represents a confluence of vascular and osseous shadow. The lungs are otherwise clear. No pneumothorax or pleural effusion. Cardiac size within normal limits. No acute bone abnormality. IMPRESSION: No active disease. Electronically Signed   By: Helyn Numbers MD   On: 02/29/2020 03:39    Review of Systems  Unable to perform ROS: Acuity of condition    Blood pressure 126/62, pulse (!) 115, temperature 99.3 F (37.4 C), temperature source Rectal, resp. rate (!) 32, height  (1.676 m), weight 67.1 kg, SpO2 95 %. Physical Exam Constitutional:      General: She is not in acute distress.    Appearance: She is ill-appearing.     Interventions: She is not intubated. HENT:     Head: Normocephalic and atraumatic.     Mouth/Throat:     Mouth: Mucous membranes are dry.  Eyes:     Comments: Pupils are mid and slow, but reactive   Cardiovascular:     Rate and Rhythm: Tachycardia present. Occasional extrasystoles are present.    Pulses: Normal pulses.     Heart sounds: Normal heart sounds.  Pulmonary:     Effort: Tachypnea present. No accessory muscle usage, respiratory distress or retractions. She is not intubated.  Abdominal:     General: Bowel sounds are decreased.     Tenderness: There is generalized abdominal tenderness. There is guarding.  Musculoskeletal:     Right lower leg: No edema.     Left lower leg: No edema.  Neurological:     Mental Status: She is lethargic.     GCS: GCS eye subscore is 4. GCS verbal subscore is 3. GCS motor subscore is 5.     Comments: Post-ictal state       Critical Care Time devoted to patient care services described in this note is 55 minutes.  Overall, patient is critically  ill, prognosis is guarded.   Patient with Multiorgan failure and at high risk for cardiac arrest and death.  Images reviewed directly and interpretation in A&P is my own unless noted Labs reviewed and evaluated as noted in A&P  Elyn Aquas, MD 02/29/2020, 8:34  AM

## 2020-02-29 NOTE — ED Provider Notes (Addendum)
[facility] Emergency Department Provider Note  ____________________________________________   Event Date/Time   First MD Initiated Contact with Patient 02/29/20 0221     (approximate)  I have reviewed the triage vital signs and the nursing notes.   HISTORY  Chief Complaint No chief complaint on file.    HPI Holly Navarro is a 75 y.o. female with history of hypertension, hyperlipidemia, gastric ulcer who presents to the emergency department sudden onset diffuse severe sharp abdominal pain that started at 10 PM last night with nausea, vomiting, cold sweats.  No chest pain or shortness of breath.  No diarrhea, melena, bloody stools.  Reports normal bowel movement tonight.  No dysuria, hematuria, vaginal bleeding or discharge.  She has had previous ovarian cystectomy but no other abdominal surgery.  No aggravating or alleviating factors.  Has never had similar symptoms.    Son Tammy Sours - [phone]    Past Medical History:  Diagnosis Date  . Anxiety   . Arthritis   . Cardiac arrhythmia   . Hyperlipidemia   . Stomach ulcer     Patient Active Problem List   Diagnosis Date Noted  . Elevated serum creatinine 12/12/2019  . Mixed dyslipidemia 11/24/2017  . Hypertensive kidney disease with CKD stage III (HCC) 11/03/2014  . Essential hypertension 10/01/2014  . Cardiac arrhythmia 10/01/2014    Past Surgical History:  Procedure Laterality Date  . CYSTECTOMY      Prior to Admission medications   Medication Sig Start Date End Date Taking? Authorizing Provider  atenolol (TENORMIN) 50 MG tablet Take 1 tablet (50 mg total) by mouth daily. 12/11/19  Yes Malfi, Jodelle Gross, FNP  cimetidine (TAGAMET) 200 MG tablet Take 200 mg by mouth 2 (two) times daily.   Yes [provider]  co-enzyme Q-10 30 MG capsule Take 30 mg by mouth 3 (three) times daily.   Yes [provider]  cyclobenzaprine (FLEXERIL) 10 MG tablet Take 10 mg by mouth 3 (three) times  daily as needed for muscle spasms.   Yes [provider]  Dextromethorphan-guaiFENesin (CORICIDIN HBP CONGESTION/COUGH) 10-200 MG CAPS Take 2 capsules by mouth daily as needed.   Yes [provider]  lisinopril (ZESTRIL) 10 MG tablet Take 1 tablet (10 mg total) by mouth daily. 12/11/19  Yes Malfi, Jodelle Gross, FNP  omega-3 acid ethyl esters (LOVAZA) 1 G capsule Take by mouth 2 (two) times daily.   Yes [provider]  OVER THE COUNTER MEDICATION Take 3 capsules by mouth daily. VISICLEAR - [employer] Formula   Yes [provider]  Phenylephrine-DM-GG (ROBITUSSIN MULTI-SYMPTOM MAX PO) Take 10 mLs by mouth every 4 (four) hours as needed.   Yes [provider]  rosuvastatin (CRESTOR) 10 MG tablet TAKE 1 TABLET BY MOUTH FIVE DAYS PER WEEK 12/11/19  Yes Malfi, Jodelle Gross, FNP  diphenhydrAMINE (BENADRYL) 25 mg capsule Take 25 mg by mouth Nightly.  Patient not taking: Reported on 02/29/2020    [provider]  hydrocortisone 2.5 % cream Apply topically. Patient not taking: Reported on 02/29/2020 08/26/19   [provider]  milk thistle 175 MG tablet Take 175 mg by mouth daily. Patient not taking: Reported on 02/29/2020    [provider]  Specialty Vitamins Products (ECHINACEA C COMPLETE PO) Take by mouth. Patient not taking: Reported on 02/29/2020    [provider]    Allergies Patient has no known allergies.  Family History  Problem Relation Age of Onset  .  Heart disease Mother   . Kidney disease Mother   . Diabetes Father   . Cancer Brother        pencreatic    Social History Social History   Tobacco Use  . Smoking status: Current Every Day Smoker    Packs/day: 0.50    Types: Cigarettes  . Smokeless tobacco: Never Used  Vaping Use  . Vaping Use: Never used  Substance Use Topics  . Alcohol use: Yes    Alcohol/week: 0.0 standard drinks    Comment: occasionally   . Drug use: No    Review of  Systems Constitutional: No fever. Eyes: No visual changes. ENT: No sore throat. Cardiovascular: Denies chest pain. Respiratory: Denies shortness of breath. Gastrointestinal: + nausea, vomiting.  No diarrhea. Genitourinary: Negative for dysuria. Musculoskeletal: Negative for back pain. Skin: Negative for rash. Neurological: Negative for focal weakness or numbness.  ____________________________________________   PHYSICAL EXAM:  VITAL SIGNS: ED Triage Vitals  Enc Vitals Group     BP 02/29/20 0101 (!) 160/110     Pulse Rate 02/29/20 0058 60     Resp 02/29/20 0058 (!) 22     Temp 02/29/20 0058 97.7 F (36.5 C)     Temp Source 02/29/20 0058 Oral     SpO2 02/29/20 0057 96 %     Weight 02/29/20 0059 148 lb (67.1 kg)     Height 02/29/20 0059 5\' 6"  (1.676 m)     Head Circumference --      Peak Flow --      Pain Score 02/29/20 0059 9     Pain Loc --      Pain Edu? --      Excl. in GC? --    CONSTITUTIONAL: Alert and oriented and responds appropriately to questions.  Elderly.  Appears uncomfortable. HEAD: Normocephalic EYES: Conjunctivae clear, pupils appear equal, EOM appear intact ENT: normal nose; moist mucous membranes NECK: Supple, normal ROM CARD: RRR; S1 and S2 appreciated; no murmurs, no clicks, no rubs, no gallops RESP: Normal chest excursion without splinting or tachypnea; breath sounds clear and equal bilaterally; no wheezes, no rhonchi, no rales, no hypoxia or respiratory distress, speaking full sentences ABD/GI: Normal bowel sounds; non-distended; diffusely tender with guarding BACK: The back appears normal EXT: Normal ROM in all joints; no deformity noted, no edema; no cyanosis SKIN: Normal color for age and race; warm; no rash on exposed skin NEURO: Moves all extremities equally PSYCH: The patient's mood and manner are appropriate.  ____________________________________________   LABS (all labs ordered are listed, but only abnormal results are displayed)  Labs  Reviewed  CBC WITH DIFFERENTIAL/PLATELET - Abnormal; Notable for the following components:      Result Value   WBC 28.5 (*)    RBC 5.48 (*)    Hemoglobin 17.5 (*)    HCT 51.9 (*)    Neutro Abs 22.3 (*)    Lymphs Abs 4.1 (*)    Monocytes Absolute 1.6 (*)    Basophils Absolute 0.2 (*)    Abs Immature Granulocytes 0.19 (*)    All other components within normal limits  COMPREHENSIVE METABOLIC PANEL - Abnormal; Notable for the following components:   Potassium 2.4 (*)    Chloride 112 (*)    CO2 20 (*)    Glucose, Bld 195 (*)    Creatinine, Ser 1.36 (*)    Calcium 8.5 (*)    GFR, Estimated 41 (*)    All other components within normal limits  LIPASE,  BLOOD - Abnormal; Notable for the following components:   Lipase 5,191 (*)    All other components within normal limits  LACTIC ACID, PLASMA - Abnormal; Notable for the following components:   Lactic Acid, Venous 6.0 (*)    All other components within normal limits  LACTIC ACID, PLASMA - Abnormal; Notable for the following components:   Lactic Acid, Venous 5.5 (*)    All other components within normal limits  CBG MONITORING, ED - Abnormal; Notable for the following components:   Glucose-Capillary 174 (*)    All other components within normal limits  RESP PANEL BY RT-PCR (FLU A&B, COVID) ARPGX2  CULTURE, BLOOD (ROUTINE X 2)  CULTURE, BLOOD (ROUTINE X 2)  MAGNESIUM  ETHANOL  URINALYSIS, COMPLETE (UACMP) WITH MICROSCOPIC  URINE DRUG SCREEN, QUALITATIVE ([facility] ONLY)  TROPONIN I (HIGH SENSITIVITY)  TROPONIN I (HIGH SENSITIVITY)   ____________________________________________  EKG   EKG Interpretation  Date/Time:  Saturday February 29 2020 00:56:20 EST Ventricular Rate:  61 PR Interval:  160 QRS Duration: 84 QT Interval:  464 QTC Calculation: 467 R Axis:   -58 Text Interpretation: Normal sinus rhythm Left axis deviation Pulmonary disease pattern Septal infarct , age undetermined Abnormal ECG Confirmed by Rochele RaringWard, Kristen [phone] on  02/29/2020 1:13:04 AM       ____________________________________________  RADIOLOGY Normajean BaxterI, Kristen Ward, personally viewed and evaluated these images (plain radiographs) as part of my medical decision making, as well as reviewing the written report by the radiologist.  ED MD interpretation: CT head shows no acute abnormality.  CT of abdomen pelvis shows acute pancreatitis.  Official radiology report(s): DG Abd 1 View  Result Date: 02/29/2020 CLINICAL DATA:  Sepsis EXAM: ABDOMEN - 1 VIEW COMPARISON:  None. FINDINGS: Normal abdominal gas pattern. Inferior pelvis excluded from view. No definite gross free intraperitoneal gas. No organomegaly. Vascular calcifications are seen within the abdominal aorta. Osseous structures are age-appropriate. IMPRESSION: Negative. Electronically Signed   By: Helyn NumbersAshesh  Parikh MD   On: 02/29/2020 03:42   CT Head Wo Contrast  Result Date: 02/29/2020 CLINICAL DATA:  Seizure, vomiting EXAM: CT HEAD WITHOUT CONTRAST TECHNIQUE: Contiguous axial images were obtained from the base of the skull through the vertex without intravenous contrast. COMPARISON:  None. FINDINGS: Brain: Normal anatomic configuration. No abnormal intra or extra-axial mass lesion or fluid collection. No abnormal mass effect or midline shift. No evidence of acute intracranial hemorrhage or infarct. Ventricular size is normal. Cerebellum unremarkable. Vascular: Unremarkable Skull: Intact Sinuses/Orbits: Paranasal sinuses are clear. Orbits are unremarkable. Other: Mastoid air cells and middle ear cavities are clear. IMPRESSION: No acute intracranial abnormality.  Normal examination. Electronically Signed   By: Helyn NumbersAshesh  Parikh MD   On: 02/29/2020 06:48   CT ABDOMEN PELVIS W CONTRAST  Result Date: 02/29/2020 CLINICAL DATA:  75 year old female with sudden onset abdominal pain tonight, vomiting. Known peptic ulcer disease. EXAM: CT ABDOMEN AND PELVIS WITH CONTRAST TECHNIQUE: Multidetector CT imaging of the abdomen  and pelvis was performed using the standard protocol following bolus administration of intravenous contrast. CONTRAST:  100mL OMNIPAQUE IOHEXOL 300 MG/ML  SOLN COMPARISON:  None. FINDINGS: Lower chest: No cardiomegaly or pericardial effusion. Bulky soft and calcified plaque in the descending thoracic aorta. No pleural effusion. Mild left greater than right lung base opacity favoring atelectasis or scarring. Underlying centrilobular emphysema is apparent. Hepatobiliary: Small volume perihepatic free fluid at the liver dome. Simple fluid density. Liver enhancement remains normal. Moderate free fluid at the porta hepatis and along the inferior liver contour.  Gallbladder remains normal. No bile duct enlargement. Pancreas: Highly abnormal. It appears that most of the enlarged and indistinct pancreatic parenchyma is nonenhancing, with a small island of preserved parenchymal enhancement at the head or uncinate (series 2, image 32 and coronal image 36). Confluent surrounding inflammation, both in the lesser sac and tracking in the upper abdomen left greater than right. Moderate volume patchy upper abdominal free fluid. Fluid layering in both gutters greater on the left. Simple fluid density. No organized or rim enhancing fluid collection. Spleen: Preserved enhancement. Regional free fluid and inflammation. Adrenals/Urinary Tract: Adrenal glands remain normal. Anterior and lateral pararenal fluid and edema but renal enhancement and contrast excretion remains normal. Normal proximal ureters. No renal mass or nephrolithiasis. Mildly distended but otherwise unremarkable urinary bladder. Stomach/Bowel: Redundant large bowel, especially the sigmoid colon. No dilated or inflamed large bowel. Normal retrocecal appendix (series 2, image 57). Negative terminal ileum. No dilated small bowel. Secondary inflammation of the stomach and duodenum. No free air. Small superimposed gastric hiatal hernia. Vascular/Lymphatic: Extensive  Calcified aortic atherosclerosis. Mild infrarenal abdominal aortic aneurysm, 31 mm diameter (series 2, image 47). Extensive iliac artery atherosclerosis. Major arterial structures remain patent, including the splenic artery. Portal venous system including the splenic vein remains patent. No lymphadenopathy. Reproductive: Negative. Other: No pelvic free fluid. Musculoskeletal: Levoconvex lumbar scoliosis with multilevel advanced disc and endplate degeneration in the spine. Grade 1 anterolisthesis at L4-L5. Associated lower lumbar severe facet arthropathy. No acute osseous abnormality identified. IMPRESSION: 1. Severe Acute Pancreatitis, with subtotal associated Pancreatic Necrosis. 2. Moderate volume free fluid and inflammation throughout the upper abdomen. No organized or rim enhancing fluid collection at this time. No vascular occlusion or other complicating features. 3. Infrarenal abdominal aortic aneurysm measuring 31 mm. Recommend follow-up Ultrasound every 3 years. This recommendation follows ACR consensus guidelines: White Paper of the ACR Incidental Findings Committee II on Vascular Findings. J Am Coll Radiol 2013; 16:109-604. 4. Aortic Atherosclerosis (ICD10-I70.0) and Emphysema (ICD10-J43.9). Electronically Signed   By: Odessa Fleming M.D.   On: 02/29/2020 05:58   DG Chest Portable 1 View  Result Date: 02/29/2020 CLINICAL DATA:  Sepsis, peptic ulcer disease EXAM: PORTABLE CHEST 1 VIEW COMPARISON:  None. FINDINGS: The lungs are symmetrically well expanded. Benign calcified granuloma within the right mid lung zone. Nodular density at the left lung base likely represents a confluence of vascular and osseous shadow. The lungs are otherwise clear. No pneumothorax or pleural effusion. Cardiac size within normal limits. No acute bone abnormality. IMPRESSION: No active disease. Electronically Signed   By: Helyn Numbers MD   On: 02/29/2020 03:39     ____________________________________________   PROCEDURES  Procedure(s) performed (including Critical Care):  Procedures  CRITICAL CARE Performed by: Rochele Raring   Total critical care time: 65 minutes  Critical care time was exclusive of separately billable procedures and treating other patients.  Critical care was necessary to treat or prevent imminent or life-threatening deterioration.  Critical care was time spent personally by me on the following activities: development of treatment plan with patient and/or surrogate as well as nursing, discussions with consultants, evaluation of patient's response to treatment, examination of patient, obtaining history from patient or surrogate, ordering and performing treatments and interventions, ordering and review of laboratory studies, ordering and review of radiographic studies, pulse oximetry and re-evaluation of patient's condition.  ____________________________________________   INITIAL IMPRESSION / ASSESSMENT AND PLAN / ED COURSE  As part of my medical decision making, I reviewed the following data within the  electronic MEDICAL RECORD NUMBER History obtained from family, Nursing notes reviewed and incorporated, Labs reviewed , EKG interpreted , Old chart reviewed, Discussed with admitting physician , A consult was requested and obtained from this/these consultant(s) Surgery and Notes from prior ED visits         Patient here with sudden onset abdominal pain.  Differential includes perforation, gastritis, gastric ulcer, colitis, diverticulitis, appendicitis, kidney stone, pyelonephritis, UTI, pancreatitis, cholecystitis, cholangitis, cholelithiasis.  Labs show leukocytosis of 28,000.  Will check rectal temp and obtain lactic.  Will give IV fluids, pain and nausea medicine.  She is currently hypertensive but suspect this is due to pain.  Will reassess after pain medication.  ED PROGRESS  Patient's lactate is come back at 6.0.  Her  rectal temp is 96.2.  Will place her under a Bair hugger and give 30 mL/kg IV fluid bolus and antibiotics to cover intra-abdominal pathology.  Her Covid test is pending.  Will obtain abdominal x-ray to evaluate for free air.  3:55 AM Pt's chest x-ray and abdominal x-ray showed no acute abnormality.  No sign of bowel obstruction or pneumoperitoneum.  EKG nonischemic.  Troponin x2 normal and flat.  Nurse reports no improvement with morphine.  Will give Dilaudid and reassess pain and blood pressure.  Patient is hypokalemic.  Will give IV replacement.  Magnesium level pending.  No EKG changes noted.  6:30 AM  Called to bedside as patient had approximately 2-minute generalized tonic-clonic seizure.  Her stepson at bedside reports no previous history of the same.  She continues to be hypertensive.  Started on Cardene infusion and blood pressure improving quickly.  Differential includes intracranial hemorrhage, CVA, PRES.  Will obtain stat head CT.  She is currently postictal.  Blood sugar normal.  Pupils approximately 3 to 4 mm and reactive bilaterally.  CT of the abdomen pelvis shows subtotal necrotizing pancreatitis.  No abscess, pseudocyst appreciated.  No history of the same per stepson.  He is not aware of any history of heavy alcohol use.  No gallstones seen on CT imaging.  She has received IV fluids and broad-spectrum antibiotics.  I did discuss patient's case with Dr. Lady Gary on-call for general surgery in case patient needed surgical intervention at some point.  She states that patient would likely need transfer to a tertiary care center that could manage hepatobiliary cases if she needed surgical intervention.  At this time patient appears to be too unstable for transfer.  I did have lengthy discussion with patient's stepson at bedside regarding goals of care.  He states that she has 4 children that live out of state.  He will try to get in contact with them.  He is not aware of any of him having  POA.  Rectal temp up to 99.3.  Bair hugger removed.  Lactate still elevated likely secondary to necrotizing process present.  7:18 AM Discussed patient's case with ICU physician, Dr. Earlie Server.  I have recommended admission and patient (and family if present) agree with this plan. Admitting physician will place admission orders.   I reviewed all nursing notes, vitals, pertinent previous records and reviewed/interpreted all EKGs, lab and urine results, imaging (as available).  7:41 AM Pt now on 2.5 mg of Cardene and blood pressures have significantly improved.  She is transitioned off nonrebreather to 4 L nasal cannula but continues to be postictal.  I updated her son Tammy Sours by phone.  ICU physician has reviewed patient's labs, imaging and feels patient needs higher level of  care.  Will discuss with intensivist at [facility].  I reviewed all nursing notes and pertinent previous records as available.  I have reviewed and interpreted any EKGs, lab and urine results, imaging (as available).   7:53 AM  Spoke with Dr. Kendrick Fries, [facility] intensivist.  He will discuss case with Dr. Earlie Server here at [facility] to determine where patient is best suited.  Appreciate ICU help with this patient. ____________________________________________   FINAL CLINICAL IMPRESSION(S) / ED DIAGNOSES  Final diagnoses:  Necrotizing pancreatitis  Seizure (HCC)  Hypothermia, initial encounter  Hypokalemia     ED Discharge Orders    None      *Please note:  Holly Navarro was evaluated in Emergency Department on 02/29/2020 for the symptoms described in the history of present illness. She was evaluated in the context of the global COVID-19 pandemic, which necessitated consideration that the patient might be at risk for infection with the SARS-CoV-2 virus that causes COVID-19. Institutional protocols and algorithms that pertain to the evaluation of patients at risk for COVID-19 are in a state of rapid change based on information  released by regulatory bodies including the CDC and federal and state organizations. These policies and algorithms were followed during the patient's care in the ED.  Some ED evaluations and interventions may be delayed as a result of limited staffing during and the pandemic.*   Note:  This document was prepared using Dragon voice recognition software and may include unintentional dictation errors.   Ward, Layla Maw, DO 02/29/20 0718    Ward, Layla Maw, DO 02/29/20 [id number]

## 2020-02-29 NOTE — ED Triage Notes (Signed)
Diffuse abd pain. Sudden onset tonight after vomiting. Reports known peptic ulcer. Denies blood in vomit. Denies fevers at home. Denies diarrhea. EMS reports pt was in afib 60-120's en route and pt denies hx of same. Denies chest pain or pressures.

## 2020-02-29 NOTE — ED Notes (Signed)
First rn note: pt with n/v, generalized abd pain per ems for 2 hours. Pt with a fib noted on ekg per ems, hr 60-120 per ems, 180/70, 94% on ra. Ems gave 4mg  iv zofran.

## 2020-02-29 NOTE — Consult Note (Signed)
PHARMACY CONSULT NOTE  Pharmacy Consult for Electrolyte Monitoring and Replacement   Recent Labs: Potassium (mmol/L)  Date Value  02/29/2020 2.4 (LL)   Magnesium (mg/dL)  Date Value  56/70/1410 2.2   Calcium (mg/dL)  Date Value  30/13/1438 8.5 (L)   Albumin (g/dL)  Date Value  88/75/7972 4.0  10/22/2014 4.3   Sodium (mmol/L)  Date Value  02/29/2020 145  10/22/2014 140   Assessment: Patient is a 75 y/o F with medical history including HTN, CKD who was admitted 2/26 with severe acute pancreatitis. Pharmacy has been consulted to assist with electrolyte monitoring and replacement as indicated.   Labs on admission notable for lipase 5191, K 2.4, Scr 1.36, LA 5.5, WBC 28.5  Diet: NPO MIVF: LR at 150 mL/hr Access: PIV  Goal of Therapy:  Electrolytes within normal limits  Plan:  --Patient has received KCl 10 mEq x 4 runs since admission --Will re-check potassium level at 10 am. Will also check a phosphorous  Tressie Ellis 02/29/2020 8:46 AM

## 2020-02-29 NOTE — Progress Notes (Signed)
Sepsis monitoring by [facility]

## 2020-02-29 NOTE — Progress Notes (Signed)
[facility]  Case discussed with Drs. Ward and Earlie Server.  75 y/o female presented with severe acute pancreatitis, hypertensive emergency and seizure with elevated lactic acid.  Her CT scan shows findings consistent with severe acute pancreatitis and free fluid in her abdomen associated with that.  Initial request was to admit the patient to [facility] in case an invasive abdominal procedure would be indicated.  It is clear from the multiple radiographic, vital sign and laboratory derrangements that the risk of death is very high, but this risk would not be currently mitigated by an invasive procedure.  Currently we do not have ICU beds available at [facility]. Have recommended admission to [facility] ICU, close monitoring.  If her condition changes such that it would require an invasive intervention then we can revisit bringing her [facility].    Heber [place], MD [facility] Pager: [phone] Cell: [phone] If no response, call [phone]

## 2020-02-29 NOTE — ED Notes (Signed)
Pt's step-son at bedside informed of visiting hours and room number at this time.

## 2020-02-29 NOTE — ED Notes (Signed)
RN called pharmacy for Keppra dose.

## 2020-02-29 NOTE — Consult Note (Signed)
PHARMACY CONSULT NOTE  Pharmacy Consult for Electrolyte Monitoring and Replacement   Recent Labs: Potassium (mmol/L)  Date Value  02/29/2020 5.1   Magnesium (mg/dL)  Date Value  97/98/9211 2.0   Calcium (mg/dL)  Date Value  94/17/4081 8.6 (L)   Albumin (g/dL)  Date Value  44/81/8563 4.0  10/22/2014 4.3   Phosphorus (mg/dL)  Date Value  14/97/0263 3.6   Sodium (mmol/L)  Date Value  02/29/2020 135  10/22/2014 140   Assessment: Patient is a 75 y/o F with medical history including HTN, CKD who was admitted 2/26 with severe acute pancreatitis. Pharmacy has been consulted to assist with electrolyte monitoring and replacement as indicated.   Labs on admission notable for lipase 5191, K 2.4, Scr 1.36, LA 5.5, WBC 28.5  Scr 1.36>1.61  Diet: NPO MIVF: LR at 200 mL/hr Access: PIV  Goal of Therapy:  Electrolytes within normal limits  Plan:  2/26 at 1750 K 5.1 --Potassium improved from 2.4 >> 5.1 after KCl 10 mEq x 4. Increase much higher than anticipated. Questioned if lab may have been drawn from arm potassium was infusing into, however, 3 K levels have been over 5 mmol/L --Will follow-up all electrolytes with AM labs  Reatha Armour, PharmD Pharmacy Resident  02/29/2020 6:39 PM

## 2020-02-29 NOTE — ED Notes (Signed)
Lab called to add on troponin. Per lab staff will be put into process at this time.

## 2020-02-29 NOTE — Progress Notes (Signed)
PHARMACY -  BRIEF ANTIBIOTIC NOTE   Pharmacy has received consult(s) for Cefepime from an ED provider.  The patient's profile has been reviewed for ht/wt/allergies/indication/available labs.    One time order(s) placed for Cefepime 2gm  Further antibiotics/pharmacy consults should be ordered by admitting physician if indicated.       Otelia Sergeant, PharmD, [facility] 02/29/2020 3:32 AM

## 2020-02-29 NOTE — ED Notes (Signed)
Report received from Megan, RN

## 2020-02-29 NOTE — Progress Notes (Signed)
CODE SEPSIS - PHARMACY COMMUNICATION  **Broad Spectrum Antibiotics should be administered within 1 hour of Sepsis diagnosis**  Time Code Sepsis Called/Page Received: 0600  Antibiotics Ordered: Cefepime and Flagyl  Time of 1st antibiotic administration: 4599  Otelia Sergeant, PharmD, [facility] 02/29/2020 3:23 AM

## 2020-02-29 NOTE — Consult Note (Signed)
PHARMACY CONSULT NOTE  Pharmacy Consult for Electrolyte Monitoring and Replacement   Recent Labs: Potassium (mmol/L)  Date Value  02/29/2020 5.1   Magnesium (mg/dL)  Date Value  74/12/8784 2.2   Calcium (mg/dL)  Date Value  76/72/0947 8.5 (L)   Albumin (g/dL)  Date Value  09/62/8366 4.0  10/22/2014 4.3   Phosphorus (mg/dL)  Date Value  29/47/6546 3.6   Sodium (mmol/L)  Date Value  02/29/2020 145  10/22/2014 140   Assessment: Patient is a 75 y/o F with medical history including HTN, CKD who was admitted 2/26 with severe acute pancreatitis. Pharmacy has been consulted to assist with electrolyte monitoring and replacement as indicated.   Labs on admission notable for lipase 5191, K 2.4, Scr 1.36, LA 5.5, WBC 28.5  Diet: NPO MIVF: LR at 200 mL/hr Access: PIV  Goal of Therapy:  Electrolytes within normal limits  Plan:  2/26 at 0956 K 5.1, Phos 3.6 --Potassium improved from 2.4 >> 5.1 after KCl 10 mEq x 4. Increase much higher than anticipated. Lab may have been drawn from arm potassium was infusing into --Will re-check potassium at 1800 --Will follow-up all electrolytes with AM labs  Tressie Ellis 02/29/2020 2:44 PM

## 2020-02-29 NOTE — Progress Notes (Signed)
Goals of Care Discussion  Updated the patient's daughter, Holly Navarro and son bedside with 2 other sons on conference call regarding plan of care. This included the patient's diagnosis of acute necrotizing pancreatitis with new onset seizures. We discussed current and future interventions including IVF resuscitation, monitoring labs/ vitals/ signs of infection, current medications including keppra, planned diagnostic testing.  During this discussion the patient's children asked the patient about her wishes regarding CODE STATUS. I clarified for the patient what a FULL CODE meant including CPR, ACLS medications and/or defibrillation if needed as well as intubation if needed. The patient clearly stated that she would not want to be resuscitated in any way or mechanically intubated if her status were to deteriorate. Daughter Holly Navarro and son were present for this discussion and in agreement.  CODE STATUS changed to DNR/DNI.  Family are satisfied with Plan of action and management. All questions answered  Additional CC time 32 mins   Betsey Holiday, AGACNP-BC Acute Care Nurse Practitioner [facility]   [phone] / [phone] Please see Amion for pager details.

## 2020-03-01 DIAGNOSIS — R569 Unspecified convulsions: Secondary | ICD-10-CM | POA: Diagnosis not present

## 2020-03-01 DIAGNOSIS — K8591 Acute pancreatitis with uninfected necrosis, unspecified: Secondary | ICD-10-CM | POA: Diagnosis not present

## 2020-03-01 LAB — COMPREHENSIVE METABOLIC PANEL
ALT: 14 U/L (ref 0–44)
AST: 44 U/L — ABNORMAL HIGH (ref 15–41)
Albumin: 2.9 g/dL — ABNORMAL LOW (ref 3.5–5.0)
Alkaline Phosphatase: 39 U/L (ref 38–126)
Anion gap: 9 (ref 5–15)
BUN: 20 mg/dL (ref 8–23)
CO2: 21 mmol/L — ABNORMAL LOW (ref 22–32)
Calcium: 8.2 mg/dL — ABNORMAL LOW (ref 8.9–10.3)
Chloride: 109 mmol/L (ref 98–111)
Creatinine, Ser: 1.31 mg/dL — ABNORMAL HIGH (ref 0.44–1.00)
GFR, Estimated: 43 mL/min — ABNORMAL LOW (ref >60)
Glucose, Bld: 218 mg/dL — ABNORMAL HIGH (ref 70–99)
Potassium: 4.5 mmol/L (ref 3.5–5.1)
Sodium: 139 mmol/L (ref 135–145)
Total Bilirubin: 1.5 mg/dL — ABNORMAL HIGH (ref 0.3–1.2)
Total Protein: 5.3 g/dL — ABNORMAL LOW (ref 6.5–8.1)

## 2020-03-01 LAB — CBC WITH DIFFERENTIAL/PLATELET
Abs Immature Granulocytes: 0.06 10*3/uL (ref 0.00–0.07)
Basophils Absolute: 0 10*3/uL (ref 0.0–0.1)
Basophils Relative: 0 %
Eosinophils Absolute: 0 10*3/uL (ref 0.0–0.5)
Eosinophils Relative: 0 %
HCT: 45.1 % (ref 36.0–46.0)
Hemoglobin: 15 g/dL (ref 12.0–15.0)
Immature Granulocytes: 0 %
Lymphocytes Relative: 10 %
Lymphs Abs: 1.6 10*3/uL (ref 0.7–4.0)
MCH: 31.3 pg (ref 26.0–34.0)
MCHC: 33.3 g/dL (ref 30.0–36.0)
MCV: 94 fL (ref 80.0–100.0)
Monocytes Absolute: 1.5 10*3/uL — ABNORMAL HIGH (ref 0.1–1.0)
Monocytes Relative: 9 %
Neutro Abs: 13.3 10*3/uL — ABNORMAL HIGH (ref 1.7–7.7)
Neutrophils Relative %: 81 %
Platelets: 141 10*3/uL — ABNORMAL LOW (ref 150–400)
RBC: 4.8 MIL/uL (ref 3.87–5.11)
RDW: 13.2 % (ref 11.5–15.5)
WBC: 16.5 10*3/uL — ABNORMAL HIGH (ref 4.0–10.5)
nRBC: 0 % (ref 0.0–0.2)

## 2020-03-01 LAB — PHOSPHORUS: Phosphorus: 3.5 mg/dL (ref 2.5–4.6)

## 2020-03-01 LAB — MAGNESIUM: Magnesium: 2 mg/dL (ref 1.7–2.4)

## 2020-03-01 LAB — PROCALCITONIN: Procalcitonin: 1.55 ng/mL

## 2020-03-01 LAB — LACTIC ACID, PLASMA: Lactic Acid, Venous: 1.9 mmol/L (ref 0.5–1.9)

## 2020-03-01 MED ORDER — HEPARIN SODIUM (PORCINE) 5000 UNIT/ML IJ SOLN
5000.0000 [IU] | Freq: Three times a day (TID) | INTRAMUSCULAR | Status: DC
  Administered 2020-03-01 – 2020-03-13 (×36): 5000 [IU] via SUBCUTANEOUS
  Filled 2020-03-01 (×36): qty 1

## 2020-03-01 MED ORDER — HYDROMORPHONE HCL 1 MG/ML IJ SOLN
0.5000 mg | INTRAMUSCULAR | Status: DC | PRN
  Administered 2020-03-01 – 2020-03-05 (×16): 0.5 mg via INTRAVENOUS
  Filled 2020-03-01 (×16): qty 1

## 2020-03-01 MED ORDER — METRONIDAZOLE IN NACL 5-0.79 MG/ML-% IV SOLN
500.0000 mg | Freq: Three times a day (TID) | INTRAVENOUS | Status: DC
  Administered 2020-03-01 (×2): 500 mg via INTRAVENOUS
  Filled 2020-03-01 (×5): qty 100

## 2020-03-01 MED ORDER — LACTATED RINGERS IV SOLN: INTRAVENOUS | Status: AC

## 2020-03-01 MED ORDER — SODIUM CHLORIDE 0.9 % IV SOLN
2.0000 g | Freq: Two times a day (BID) | INTRAVENOUS | Status: DC
  Administered 2020-03-01 (×2): 2 g via INTRAVENOUS
  Filled 2020-03-01 (×4): qty 2

## 2020-03-01 MED ORDER — HYDROMORPHONE HCL 1 MG/ML IJ SOLN
0.5000 mg | INTRAMUSCULAR | Status: DC | PRN
Start: 2020-03-01 — End: 2020-03-01
  Administered 2020-03-01 (×2): 0.5 mg via INTRAVENOUS
  Filled 2020-03-01 (×2): qty 1

## 2020-03-01 MED ORDER — PANTOPRAZOLE SODIUM 40 MG IV SOLR
40.0000 mg | INTRAVENOUS | Status: DC
  Administered 2020-03-01 – 2020-03-11 (×7): 40 mg via INTRAVENOUS
  Filled 2020-03-01 (×7): qty 40

## 2020-03-01 MED ORDER — PIPERACILLIN-TAZOBACTAM 3.375 G IVPB
3.3750 g | Freq: Three times a day (TID) | INTRAVENOUS | Status: DC
  Administered 2020-03-01 – 2020-03-06 (×15): 3.375 g via INTRAVENOUS
  Filled 2020-03-01 (×16): qty 50

## 2020-03-01 NOTE — Progress Notes (Signed)
CRITICAL CARE NOTE SYNOPSIS 75 y.o.femalewith history of hypertension, hyperlipidemia, gastric ulcer who presented to [facility] emergency department for sudden onset diffuse severe sharp abdominal pain.   associated with onset of nausea and cramps. She did vomit and became diaphoretic after emesis. The pain did radiate across her entire abdomen and to her back. She was found to be hypothermic and hypertensive upon her arrival she was warmed and started on fluids.  lactic acid of 6. There was a leukocytosis in excess of 20.    CT revealed necrotizing pancreatitis with a corroborating lipase.  The case was discussed with our local surgeon who felt she may be best served in transfer. The case was then d/w with [facility] who explained there were no beds and without acute intervention planned, she would likely need to wait and be stabilized at [facility].     CC  follow up acute pancreaitis  SUBJECTIVE Patient remains ill Prognosis is guarded DNR/DNI status    BP (!) 148/66 (BP Location: Left Arm)   Pulse (!) 101   Temp 98.5 F (36.9 C) (Oral)   Resp (!) 29   Ht 5\' 6"  (1.676 m)   Wt 70.9 kg   SpO2 (!) 86%   BMI 25.23 kg/m    I/O last 3 completed shifts: In: 1597.6 [I.V.:997.3; IV Piggyback:600.2] Out: 2125 [Urine:2125] Total I/O In: 140.3 [I.V.:140.3] Out: -   SpO2: (!) 86 % O2 Flow Rate (L/min): 2 L/min  Estimated body mass index is 25.23 kg/m as calculated from the following:   Height as of this encounter: 5\' 6"  (1.676 m).   Weight as of this encounter: 70.9 kg.   ROS More awake Less confused   PHYSICAL EXAMINATION:  GENERAL: ill appearing,  EYES: Pupils equal, round, reactive to light.  No scleral icterus.  MOUTH: Moist mucosal membrane. NECK: Supple.  PULMONARY: +rhonchi,  CARDIOVASCULAR: S1 and S2. Regular rate and rhythm. No murmurs, rubs, or gallops.  GASTROINTESTINAL: Soft, nontender, -distended.  Positive bowel sounds.   MUSCULOSKELETAL: No  swelling, clubbing, or edema.  NEUROLOGIC: somnelent SKIN:intact,warm,dry  MEDICATIONS: I have reviewed all medications and confirmed regimen as documented  CBC    Component Value Date/Time   WBC 16.5 (H) 03/01/2020 0628   RBC 4.80 03/01/2020 0628   HGB 15.0 03/01/2020 0628   HGB 14.8 10/22/2014 1304   HCT 45.1 03/01/2020 0628   HCT 43.2 10/22/2014 1304   PLT 141 (L) 03/01/2020 0628   PLT 267 10/22/2014 1304   MCV 94.0 03/01/2020 0628   MCV 94 10/22/2014 1304   MCH 31.3 03/01/2020 0628   MCHC 33.3 03/01/2020 0628   RDW 13.2 03/01/2020 0628   RDW 13.5 10/22/2014 1304   LYMPHSABS 1.6 03/01/2020 0628   LYMPHSABS 3.7 (H) 10/22/2014 1304   MONOABS 1.5 (H) 03/01/2020 0628   EOSABS 0.0 03/01/2020 0628   EOSABS 0.2 10/22/2014 1304   BASOSABS 0.0 03/01/2020 0628   BASOSABS 0.1 10/22/2014 1304   BMP Latest Ref Rng & Units 03/01/2020 02/29/2020 02/29/2020  Glucose 70 - 99 mg/dL 03/02/2020) 03/02/2020) -  BUN 8 - 23 mg/dL 20 18 -  Creatinine 354(S - 1.00 mg/dL 568(L) 2.75) -  BUN/Creat Ratio 6 - 22 (calc) - - -  Sodium 135 - 145 mmol/L 139 138 -  Potassium 3.5 - 5.1 mmol/L 4.5 4.4 5.1  Chloride 98 - 111 mmol/L 109 108 -  CO2 22 - 32 mmol/L 21(L) 18(L) -  Calcium 8.9 - 10.3 mg/dL 8.2(L) 8.1(L) -  CULTURE RESULTS   Recent Results (from the past 240 hour(s))  Resp Panel by RT-PCR (Flu A&B, Covid) Nasopharyngeal Swab     Status: None   Collection Time: 02/29/20  2:53 AM   Specimen: Nasopharyngeal Swab; Nasopharyngeal(NP) swabs in vial transport medium  Result Value Ref Range Status   SARS Coronavirus 2 by RT PCR NEGATIVE NEGATIVE Final    Comment: (NOTE) SARS-CoV-2 target nucleic acids are NOT DETECTED.  The SARS-CoV-2 RNA is generally detectable in upper respiratory specimens during the acute phase of infection. The lowest concentration of SARS-CoV-2 viral copies this assay can detect is 138 copies/mL. A negative result does not preclude SARS-Cov-2 infection and should not be used  as the sole basis for treatment or other patient management decisions. A negative result may occur with  improper specimen collection/handling, submission of specimen other than nasopharyngeal swab, presence of viral mutation(s) within the areas targeted by this assay, and inadequate number of viral copies(<138 copies/mL). A negative result must be combined with clinical observations, patient history, and epidemiological information. The expected result is Negative.  Fact Sheet for Patients:  BloggerCourse.com  Fact Sheet for Healthcare Providers:  SeriousBroker.it  This test is no t yet approved or cleared by the [place] FDA and  has been authorized for detection and/or diagnosis of SARS-CoV-2 by FDA under an Emergency Use Authorization (EUA). This EUA will remain  in effect (meaning this test can be used) for the duration of the COVID-19 declaration under Section 564(b)(1) of the Act, 21 U.S.C.section 360bbb-3(b)(1), unless the authorization is terminated  or revoked sooner.       Influenza A by PCR NEGATIVE NEGATIVE Final   Influenza B by PCR NEGATIVE NEGATIVE Final    Comment: (NOTE) The Xpert Xpress SARS-CoV-2/FLU/RSV plus assay is intended as an aid in the diagnosis of influenza from Nasopharyngeal swab specimens and should not be used as a sole basis for treatment. Nasal washings and aspirates are unacceptable for Xpert Xpress SARS-CoV-2/FLU/RSV testing.  Fact Sheet for Patients: BloggerCourse.com  Fact Sheet for Healthcare Providers: SeriousBroker.it  This test is not yet approved or cleared by the [place] FDA and has been authorized for detection and/or diagnosis of SARS-CoV-2 by FDA under an Emergency Use Authorization (EUA). This EUA will remain in effect (meaning this test can be used) for the duration of the COVID-19 declaration under Section 564(b)(1)  of the Act, 21 U.S.C. section 360bbb-3(b)(1), unless the authorization is terminated or revoked.  Performed at [facility], [address] [place], [place] [address]   Culture, blood (Routine X 2) w Reflex to ID Panel     Status: None (Preliminary result)   Collection Time: 02/29/20  3:26 AM   Specimen: BLOOD  Result Value Ref Range Status   Specimen Description BLOOD RIGHT ANTECUBITAL  Final   Special Requests   Final    BOTTLES DRAWN AEROBIC AND ANAEROBIC Blood Culture adequate volume   Culture   Final    NO GROWTH 1 DAY Performed at [facility], [address], [place], [place] [address]    Report Status PENDING  Incomplete  Culture, blood (Routine X 2) w Reflex to ID Panel     Status: None (Preliminary result)   Collection Time: 02/29/20  3:43 AM   Specimen: BLOOD  Result Value Ref Range Status   Specimen Description BLOOD BLOOD RIGHT HAND  Final   Special Requests   Final    BOTTLES DRAWN AEROBIC AND ANAEROBIC Blood Culture results  may not be optimal due to an inadequate volume of blood received in culture bottles   Culture   Final    NO GROWTH 1 DAY Performed at [facility], [address] [place], [place] [address]    Report Status PENDING  Incomplete  MRSA PCR Screening     Status: None   Collection Time: 02/29/20  9:45 AM   Specimen: Nasopharyngeal  Result Value Ref Range Status   MRSA by PCR NEGATIVE NEGATIVE Final    Comment:        The GeneXpert MRSA Assay (FDA approved for NASAL specimens only), is one component of a comprehensive MRSA colonization surveillance program. It is not intended to diagnose MRSA infection nor to guide or monitor treatment for MRSA infections. Performed at [facility], [address] [place], [place] [address]           IMAGING    US Abdomen Limited RUQ (LIVER/GB)  Result Date: 02/29/2020 CLINICAL DATA:  75 year old female with acute pancreatic necrosis. EXAM: ULTRASOUND  ABDOMEN LIMITED RIGHT UPPER QUADRANT COMPARISON:  No prior.  CT the abdomen and pelvis 02/29/2020. FINDINGS: Gallbladder: No gallstones or wall thickening visualized. No sonographic Murphy sign noted by sonographer. Common bile duct: Diameter: 4.0 mm Liver: No focal lesion identified. Within normal limits in parenchymal echogenicity. Portal vein is patent on color Doppler imaging with normal direction of blood flow towards the liver. Other: Trace amount of ascites noted adjacent to the liver. IMPRESSION: 1. Trace volume of ascites adjacent to the liver. 2. Otherwise, unremarkable examination. Specifically, no gallstones, signs of acute cholecystitis, or evidence of biliary tract dilatation. Electronically Signed   By: Trudie Reedaniel  Entrikin M.D.   On: 02/29/2020 11:53     Nutrition Status:          ASSESSMENT AND PLAN SYNOPSIS Admitted for severe acute necrotizing pancreatitis with severe sepsis present on admission -No role for surgery or perc drain at this time, there is not a discrete collection that would benefit -There is no obvious intervention required at the level of the gallbladder or duct -[facility] if come to that point  -Pan culture, but with-hold antibiotics at the present time and trend leukocytosis -The empiric use of antibiotics in acute pancreatitis has not been shown to be of benefit -Ethanol level <10 -Trend Lacitc,  BMP and CBC, monitor renal function and hemoconcentration carefully  -Obtain: Triglycerides, ABG, C-RP, and R-UQ US to complement CT -Will order baseline echo, hold home ACEI as possible (although less likely) etiology -Aggressive volume replacement with LR@200cc /hr up front, close titration of rate based on labs -CCM Glycemic control protocol with insulin drip as required -NPO, will need post-pyloric TF beginning tomorrow if possible -Pain management    ACUTE KIDNEY INJURY/Renal Failure -continue Foley  Catheter-assess need -Avoid nephrotoxic agents -Follow urine output, BMP -Ensure adequate renal perfusion, optimize oxygenation -Renal dose medications    NEUROLOGY Acute toxic metabolic encephalopathy from acidosis More  Awake Continue Keppra   CARDIAC ICU monitoring  ID -continue IV abx as prescibed -follow up cultures  GI GI PROPHYLAXIS as indicated  DIET-->NPO Constipation protocol as indicated  ENDO - will use ICU hypoglycemic\Hyperglycemia protocol if indicated    ELECTROLYTES -follow labs as needed -replace as needed -pharmacy consultation and following   DVT/GI PRX ordered and assessed TRANSFUSIONS AS NEEDED MONITOR FSBS I Assessed the need for Labs I Assessed the need for Foley I Assessed the need  for Central Venous Line Family Discussion when available I Assessed the need for Mobilization I made an Assessment of medications to be adjusted accordingly Safety Risk assessment completed   Overall, prognosis is guarded.    Lucie Leather, M.D.  [facility]  Medical Director [facility] Medical Director [facility] Cardio-Pulmonary Department

## 2020-03-01 NOTE — Consult Note (Signed)
PHARMACY CONSULT NOTE  Pharmacy Consult for Electrolyte Monitoring and Replacement   Recent Labs: Potassium (mmol/L)  Date Value  03/01/2020 4.5   Magnesium (mg/dL)  Date Value  53/29/9242 2.0   Calcium (mg/dL)  Date Value  68/34/1962 8.2 (L)   Albumin (g/dL)  Date Value  22/97/9892 2.9 (L)  10/22/2014 4.3   Phosphorus (mg/dL)  Date Value  11/94/1740 3.5   Sodium (mmol/L)  Date Value  03/01/2020 139  10/22/2014 140   Assessment: Patient is a 75 y/o F with medical history including HTN, CKD who was admitted 2/26 with severe acute pancreatitis. Pharmacy has been consulted to assist with electrolyte monitoring and replacement as indicated.   Labs on admission notable for lipase 5191, K 2.4, Scr 1.36, LA 5.5, WBC 28.5  Diet: NPO MIVF: LR at 50 mL/hr Access: PIV  Goal of Therapy:  Electrolytes within normal limits  Plan:  2/27 AM labs: Na 139, K 4.5, Phos 3.5, Mg 2 --No electrolyte replacement warranted at this time --Will follow-up all electrolytes with AM labs tomorrow  Tressie Ellis 03/01/2020 7:12 AM

## 2020-03-01 NOTE — Progress Notes (Signed)
No acute events overnight, had some confusion about current state of health, she was under the impression that she is going to die soon, I explained to the patient that she is stable and that she could improve from this illness and be discharged eventually. Family was at bedside during DNR discussion which may have contributed to her confusion. Once I was able to explain her current status to her she was relieved and able to rest better. She is very stoic about pain though, while in the room I noticed she winced in pain, when I asked her if she was in pain, she stated just a bit, I asked her on a scale of 1-10 how bad her pain was and she stated it was an 8, therefore I obtained orders for pain medication.

## 2020-03-01 NOTE — Progress Notes (Signed)
   03/01/20 0940  Clinical Encounter Type  Visited With Patient and family together  Visit Type Initial  Referral From Nurse  Consult/Referral To Chaplain  Chaplain Hensley Treat did the Education for one AD. Pt and family will complete it later and will page Chaplains. Pt is a DNR.

## 2020-03-01 NOTE — Plan of Care (Signed)
  Problem: Clinical Measurements: Goal: Diagnostic test results will improve Outcome: Progressing Goal: Respiratory complications will improve Outcome: Progressing Goal: Cardiovascular complication will be avoided Outcome: Progressing   

## 2020-03-01 NOTE — Progress Notes (Signed)
Pharmacy Antibiotic Note  Holly Navarro is a 75 y.o. female admitted on 02/29/2020 with necrotizing pancreatitis.  Pharmacy has been consulted for Cefepime dosing.  Pt also on Flagyl  Plan: Ordered Cefepime 2 gm q12h per indication and pt renal fxn CrCl 35.5.  Height: 5\' 6"  (167.6 cm) Weight: 70.6 kg (155 lb 10.3 oz) IBW/kg (Calculated) : 59.3  Temp (24hrs), Avg:98 F (36.7 C), Min:96.2 F (35.7 C), Max:99.3 F (37.4 C)  Recent Labs  Lab 02/29/20 0110 02/29/20 0253 02/29/20 0455 02/29/20 0956 02/29/20 1551 02/29/20 2148  WBC 28.5*  --   --   --  16.0*  --   CREATININE 1.36*  --   --   --  1.61* 1.30*  LATICACIDVEN  --  6.0* 5.5* 9.4*  --  3.1*    Estimated Creatinine Clearance: 35.5 mL/min (A) (by C-G formula based on SCr of 1.3 mg/dL (H)).    No Known Allergies  Antimicrobials this admission: 02/26 Cefepime >>  02/26 Flagyl >>   Microbiology results: 02/26 BCx: Pending  Thank you for allowing pharmacy to be a part of this patient's care.  3/26, PharmD, MBA 03/01/2020 1:32 AM

## 2020-03-01 NOTE — Plan of Care (Signed)
Patient rested comfortably throughout shift with PRN pain medication given as needed. Patients daughter at bedside visiting majority of shift.  Chaplain paged to provide information about Advanced Directive. Dr. Belia Heman updated patient and family at bedside. Patient remains on 2L [place].

## 2020-03-02 ENCOUNTER — Inpatient Hospital Stay: Payer: Medicare HMO

## 2020-03-02 ENCOUNTER — Inpatient Hospital Stay (HOSPITAL_COMMUNITY)
Admit: 2020-03-02 | Discharge: 2020-03-02 | Disposition: A | Discharge status: Home / Self Care | Payer: Medicare HMO | Attending: Pulmonary Disease | Admitting: Pulmonary Disease

## 2020-03-02 DIAGNOSIS — I5031 Acute diastolic (congestive) heart failure: Secondary | ICD-10-CM

## 2020-03-02 DIAGNOSIS — K8591 Acute pancreatitis with uninfected necrosis, unspecified: Secondary | ICD-10-CM | POA: Diagnosis not present

## 2020-03-02 LAB — BASIC METABOLIC PANEL
Anion gap: 7 (ref 5–15)
BUN: 24 mg/dL — ABNORMAL HIGH (ref 8–23)
CO2: 23 mmol/L (ref 22–32)
Calcium: 8.3 mg/dL — ABNORMAL LOW (ref 8.9–10.3)
Chloride: 111 mmol/L (ref 98–111)
Creatinine, Ser: 1.21 mg/dL — ABNORMAL HIGH (ref 0.44–1.00)
GFR, Estimated: 47 mL/min — ABNORMAL LOW (ref >60)
Glucose, Bld: 221 mg/dL — ABNORMAL HIGH (ref 70–99)
Potassium: 4.1 mmol/L (ref 3.5–5.1)
Sodium: 141 mmol/L (ref 135–145)

## 2020-03-02 LAB — ECHOCARDIOGRAM COMPLETE
AR max vel: 1.99 cm2
AV Area VTI: 2.07 cm2
AV Area mean vel: 2.02 cm2
AV Mean grad: 6 mmHg
AV Peak grad: 10.8 mmHg
Ao pk vel: 1.64 m/s
Area-P 1/2: 12.64 cm2
Height: 66 in
MV VTI: 2.39 cm2
S' Lateral: 1.8 cm
Weight: 2500.9 oz

## 2020-03-02 LAB — MAGNESIUM: Magnesium: 2 mg/dL (ref 1.7–2.4)

## 2020-03-02 LAB — CBC
HCT: 41.3 % (ref 36.0–46.0)
Hemoglobin: 13.5 g/dL (ref 12.0–15.0)
MCH: 31.2 pg (ref 26.0–34.0)
MCHC: 32.7 g/dL (ref 30.0–36.0)
MCV: 95.4 fL (ref 80.0–100.0)
Platelets: 124 10*3/uL — ABNORMAL LOW (ref 150–400)
RBC: 4.33 MIL/uL (ref 3.87–5.11)
RDW: 13.2 % (ref 11.5–15.5)
WBC: 14.7 10*3/uL — ABNORMAL HIGH (ref 4.0–10.5)
nRBC: 0 % (ref 0.0–0.2)

## 2020-03-02 LAB — GLUCOSE, CAPILLARY
Glucose-Capillary: 242 mg/dL — ABNORMAL HIGH (ref 70–99)
Glucose-Capillary: 235 mg/dL — ABNORMAL HIGH (ref 70–99)
Glucose-Capillary: 222 mg/dL — ABNORMAL HIGH (ref 70–99)

## 2020-03-02 LAB — PROCALCITONIN: Procalcitonin: 2.03 ng/mL

## 2020-03-02 LAB — PHOSPHORUS: Phosphorus: 2.4 mg/dL — ABNORMAL LOW (ref 2.5–4.6)

## 2020-03-02 MED ORDER — INSULIN ASPART 100 UNIT/ML ~~LOC~~ SOLN
0.0000 [IU] | Freq: Every day | SUBCUTANEOUS | Status: DC
  Administered 2020-03-02: 2 [IU] via SUBCUTANEOUS
  Filled 2020-03-02: qty 1

## 2020-03-02 MED ORDER — ADULT MULTIVITAMIN W/MINERALS CH
1.0000 | ORAL_TABLET | Freq: Every day | ORAL | Status: DC
  Administered 2020-03-03 – 2020-03-14 (×10): 1 via ORAL
  Filled 2020-03-02 (×12): qty 1

## 2020-03-02 MED ORDER — INSULIN ASPART 100 UNIT/ML ~~LOC~~ SOLN
0.0000 [IU] | Freq: Three times a day (TID) | SUBCUTANEOUS | Status: DC
  Administered 2020-03-02 (×2): 5 [IU] via SUBCUTANEOUS
  Administered 2020-03-03: 11 [IU] via SUBCUTANEOUS
  Filled 2020-03-02 (×3): qty 1

## 2020-03-02 MED ORDER — BOOST / RESOURCE BREEZE PO LIQD CUSTOM
1.0000 | Freq: Three times a day (TID) | ORAL | Status: DC
  Administered 2020-03-02 – 2020-03-12 (×23): 1 via ORAL

## 2020-03-02 NOTE — Progress Notes (Signed)
PT Cancellation Note  Patient Details Name: LORIS SEELYE MRN: [id number] DOB: Apr 03, 1945   Cancelled Treatment:    Reason Eval/Treat Not Completed: Fatigue/lethargy limiting ability to participate. Patient just recently got dilaudid, she is awake, but lethargic and states she can't do anything right now. RN aware. Will continue to monitor and mobilize as able.    Jonatan Wilsey 03/02/2020, 1:58 PM

## 2020-03-02 NOTE — Progress Notes (Signed)
NAME:  Holly Navarro, MRN:  [id number], DOB:  08-19-1945, LOS: 2 ADMISSION DATE:  02/29/2020, CONSULTATION DATE:  02/29/2020 REFERRING MD:  Dr. Elesa Massed, CHIEF COMPLAINT:  Acute Pancreatitis  Brief History:  75 y.o. Female admitted with severe sepsis in the setting of severe acute necrotizing pancreatitis.  History of Present Illness:  Holly A Ryanis a 75 y.o.femalewith history of hypertension, hyperlipidemia, gastric ulcer who presented to [facility] emergency department for sudden onset diffuse severe sharp abdominal pain. The discomfort began  Around 10 PM last night and was associated with onset of nausea and cramps. She did vomit and became diaphoretic after emesis. The pain did radiate across her entire abdomen and to her back. There was no associated diarrhea or lose stool and no hematochezia or hematemesis. She was found to be hypothermic and hypertensive upon her arrival she was warmed and started on fluids. Her evaluation returned a lactic acid of 6. There was a leukocytosis in excess of 20. A CT revealed necrotizing pancreatitis with a corroborating lipase. The case was discussed with our local surgeon who felt she may be best served in transfer. The case was then d/w with [facility] Intensivist who explained there were no beds and without acute intervention planned, she would likely need to wait and be stabilized at [facility].  Case d/w the patient's son Holly Navarro: [phone], he will mobilize family for discussion of her care and visitation from out of state ([place] and [place]).  Past Medical History:   . Anxiety   . Arthritis   . Cardiac arrhythmia   . Hyperlipidemia   . Stomach ulcer      Significant Hospital Events:  2/26: Admission to ICU  Consults:  PCCM Neurology  Procedures:  N/A  Significant Diagnostic Tests:  2/26: CXR>>The lungs are symmetrically well expanded. Benign calcified granuloma within the right mid lung zone. Nodular density at the left  lung base likely represents a confluence of vascular and osseous shadow. The lungs are otherwise clear. No pneumothorax or pleural effusion. Cardiac size within normal limits. No acute bone Abnormality. 2/26: KUB>>Normal abdominal gas pattern. Inferior pelvis excluded from view. No definite gross free intraperitoneal gas. No organomegaly. Vascular calcifications are seen within the abdominal aorta. Osseous structures are age-appropriate. 2/26: CT Abdomen & Pelvis>>1. Severe Acute Pancreatitis, with subtotal associated Pancreatic Necrosis. 2. Moderate volume free fluid and inflammation throughout the upper abdomen. No organized or rim enhancing fluid collection at this time. No vascular occlusion or other complicating features. 3. Infrarenal abdominal aortic aneurysm measuring 31 mm. Recommend follow-up Ultrasound every 3 years. This recommendation follows ACR consensus guidelines: White Paper of the ACR Incidental Findings Committee II on Vascular Findings. J Am Coll Radiol 2013; 24:580-998. 4. Aortic Atherosclerosis (ICD10-I70.0) and Emphysema (ICD10-J43.9). 2/26: CT Head>>No acute intracranial abnormality.  Normal examination. 2/26: US Abdomen (RUQ)>>1. Trace volume of ascites adjacent to the liver. 2. Otherwise, unremarkable examination. Specifically, no gallstones, signs of acute cholecystitis, or evidence of biliary tract Dilatation. 2/28: Echocardiogram>>   Micro Data:  2/26: SARS-CoV-2 PCR>> negative 2/26: Influenza A&B PCR>> negative 2/26: Blood culture x2>> no growth to date 2/26: MRSA PCR>> negative  Antimicrobials:  Cefepime 2/26>> 2/27 Flagyl 2/26>> 2/27 Zosyn 2/27>>  Interim History / Subjective:  Reports she is feeling a little bit better, does have some abdominal pain which has been controlled with dilaudid Denies chest pain, SOB, wheezing, sputum production, HA, dizziness, N/V/D, fever, chills, edema Ice chips this morning, tolerating ~ will advance to clear  liquids as tolerated On 5L [place], afebrile, hemodynamically stable Creatinine slowly improving (down to 1.21), urine output last 24 hrs 675 ml   Objective   Blood pressure 129/67, pulse (!) 109, temperature 99 F (37.2 C), temperature source Oral, resp. rate (!) 23, height 5\' 6"  (1.676 m), weight 70.9 kg, SpO2 92 %.        Intake/Output Summary (Last 24 hours) at 03/02/2020 0849 Last data filed at 03/02/2020 0829 Gross per 24 hour  Intake 495.6 ml  Output 750 ml  Net -254.4 ml   Filed Weights   02/29/20 0059 02/29/20 0941 03/01/20 0500  Weight: 67.1 kg 70.6 kg 70.9 kg    Examination: General: Acutely ill appearing female, sitting in bed, on 5L [place], in no acute distress HENT: Atraumatic,normocephalic, neck supple, no JVD Lungs: Clear to auscultation bilaterally, mild tachypnea, even Cardiovascular: Tachycardia, regular rhythm (sinus tachycardia), s1s2, no M/R/G, 2+ distal pulses Abdomen: Soft, tender, nondistended, no guarding or rebound tenderness, BS Hypoactive Extremities: Normal bulk and tone, no deformities, no edema Neuro: Awake, A&O x3, follows commands, no focal deficits, speech clear, pupils PERRL Skin: Warm and dry.  No obvious rashes, lesions, or ulcerations  Resolved Hospital Problem list   N/A  Assessment & Plan:   Acute Necrotizing Pancreatitis -No role for surgery or perc drain at this time, there is not a discrete collection that would benefit -There is no obvious intervention required at the level of the gallbladder or duct -Will maintain contact with 03/03/20 for potential need of IR if come to that point -Monitor fever curve -Trend WBC's & Procalcitonin -Follow pan cultures as above -Continue Zosyn -Lactic has normalized -Ethanol level <10, Triglycerides 169 -RUQ Abdominal [facility] on 2/26 without gallstones, acute cholecystitis, or biliary tract dilatation -IV fluids -Pain control -Implementing ice chips (currently tolerating), plan to advance to clear  liquids today   Acute Hypoxic Respiratory Failure in the setting of Atelectasis and Left Pleural Effusion -Supplemental O2 as needed to maintain O2 sats >92% -Follow intermittent CXR and ABG as needed -CXR on 2/28 with new LEFT pleural effusion with bibasilar atelectasis greater on LEFT. -Mobilize as able -Incentive spirometry & Flutter valve -Echocardiogram pending   Acute Metabolic Encephalopathy>>resolved Generalized Tonic-Clonic Seizure -Provide supportive care -Seizure precautions -Continue Keppra -CT Head on 2/26 negative for any acute intracranial abnormality -EEG pending -Neurology consulted, appreciate input   Acute Kidney Injury -Monitor I&O's / urinary output -Follow BMP -Ensure adequate renal perfusion -Avoid nephrotoxic agents as able -Replace electrolytes as indicated -IV fluids   Hyperglycemia -CBG's -Sliding scale insulin -Follow ICU Hypo/hyperglycemia protocl    Best practice (evaluated daily)  Diet: Ice chips, advance to clear liquids Pain/Anxiety/Delirium protocol (if indicated): Prn Dilaudid VAP protocol (if indicated): N/A DVT prophylaxis: Heparin SQ GI prophylaxis: Protonix IV Glucose control: SSI Mobility: As tolerated Disposition: ICU  Goals of Care:  Last date of multidisciplinary goals of care discussion:03/02/2020 Family and staff present: Pt and son at bedside Summary of discussion: Advance diet as tolerated, continue ABX, pain control Follow up goals of care discussion due: 2/29/2022 Code Status: DNR/DNI  Labs   CBC: Recent Labs  Lab 02/29/20 0110 02/29/20 1551 03/01/20 0628 03/02/20 0625  WBC 28.5* 16.0* 16.5* 14.7*  NEUTROABS 22.3*  --  13.3*  --   HGB 17.5* 17.0* 15.0 13.5  HCT 51.9* 50.2* 45.1 41.3  MCV 94.7 94.7 94.0 95.4  PLT 273 184 141* 124*    Basic Metabolic Panel: Recent Labs  Lab 02/29/20 0110 02/29/20  [id number] 02/29/20 1551 02/29/20 1750 02/29/20 2148 03/01/20 0628 03/02/20 0625  NA 145  --  135  --   138 139 141  K 2.4* 5.1 5.7* 5.1 4.4 4.5 4.1  CL 112*  --  109  --  108 109 111  CO2 20*  --  14*  --  18* 21* 23  GLUCOSE 195*  --  232*  --  213* 218* 221*  BUN 14  --  16  --  18 20 24*  CREATININE 1.36*  --  1.61*  --  1.30* 1.31* 1.21*  CALCIUM 8.5*  --  8.6*  --  8.1* 8.2* 8.3*  MG 2.2  --  2.0  --   --  2.0 2.0  PHOS  --  3.6  --   --   --  3.5 2.4*   GFR: Estimated Creatinine Clearance: 38.2 mL/min (A) (by C-G formula based on SCr of 1.21 mg/dL (H)). Recent Labs  Lab 02/29/20 0110 02/29/20 0253 02/29/20 0455 02/29/20 0956 02/29/20 1551 02/29/20 2148 03/01/20 0628 03/02/20 0625  PROCALCITON  --   --   --   --   --  1.60 1.55 2.03  WBC 28.5*  --   --   --  16.0*  --  16.5* 14.7*  LATICACIDVEN  --    < > 5.5* 9.4*  --  3.1* 1.9  --    < > = values in this interval not displayed.    Liver Function Tests: Recent Labs  Lab 02/29/20 0110 03/01/20 0628  AST 25 44*  ALT 16 14  ALKPHOS 51 39  BILITOT 0.5 1.5*  PROT 6.7 5.3*  ALBUMIN 4.0 2.9*   Recent Labs  Lab 02/29/20 0110  LIPASE 5,191*   No results for input(s): AMMONIA in the last 168 hours.  ABG    Component Value Date/Time   PHART 7.26 (L) 02/29/2020 0940   PCO2ART 30 (L) 02/29/2020 0940   PO2ART 72 (L) 02/29/2020 0940   HCO3 13.5 (L) 02/29/2020 0940   ACIDBASEDEF 12.0 (H) 02/29/2020 0940   O2SAT 91.5 02/29/2020 0940     Coagulation Profile: No results for input(s): INR, PROTIME in the last 168 hours.  Cardiac Enzymes: No results for input(s): CKTOTAL, CKMB, CKMBINDEX, TROPONINI in the last 168 hours.  HbA1C: No results found for: HGBA1C  CBG: Recent Labs  Lab 02/29/20 0628 02/29/20 0943  GLUCAP 174* 233*    Review of Systems:   Positives in BOLD: Gen: Denies fever, chills, weight change, fatigue, night sweats HEENT: Denies blurred vision, double vision, hearing loss, tinnitus, sinus congestion, rhinorrhea, sore throat, neck stiffness, dysphagia PULM: Denies shortness of breath,  +cough, sputum production, hemoptysis, wheezing CV: Denies chest pain, edema, orthopnea, paroxysmal nocturnal dyspnea, palpitations GI: Denies +abdominal pain, nausea, vomiting, diarrhea, hematochezia, melena, constipation, change in bowel habits GU: Denies dysuria, hematuria, polyuria, oliguria, urethral discharge Endocrine: Denies hot or cold intolerance, polyuria, polyphagia or appetite change Derm: Denies rash, dry skin, scaling or peeling skin change Heme: Denies easy bruising, bleeding, bleeding gums Neuro: Denies headache, numbness, weakness, slurred speech, loss of memory or consciousness   Past Medical History:  She,  has a past medical history of Anxiety, Arthritis, Cardiac arrhythmia, Hyperlipidemia, and Stomach ulcer.   Surgical History:   Past Surgical History:  Procedure Laterality Date  . CYSTECTOMY       Social History:   reports that she has been smoking cigarettes. She has been smoking about 0.50 packs per day. She  has never used smokeless tobacco. She reports current alcohol use. She reports that she does not use drugs.   Family History:  Her family history includes Cancer in her brother; Diabetes in her father; Heart disease in her mother; Kidney disease in her mother.   Allergies No Known Allergies   Home Medications  Prior to Admission medications   Medication Sig Start Date End Date Taking? Authorizing Provider  atenolol (TENORMIN) 50 MG tablet Take 1 tablet (50 mg total) by mouth daily. 12/11/19  Yes Malfi, Jodelle Gross, FNP  cimetidine (TAGAMET) 200 MG tablet Take 200 mg by mouth 2 (two) times daily.   Yes [provider]  co-enzyme Q-10 30 MG capsule Take 30 mg by mouth 3 (three) times daily.   Yes [provider]  cyclobenzaprine (FLEXERIL) 10 MG tablet Take 10 mg by mouth 3 (three) times daily as needed for muscle spasms.   Yes [provider]  Dextromethorphan-guaiFENesin (CORICIDIN HBP CONGESTION/COUGH) 10-200 MG CAPS Take 2  capsules by mouth daily as needed.   Yes [provider]  lisinopril (ZESTRIL) 10 MG tablet Take 1 tablet (10 mg total) by mouth daily. 12/11/19  Yes Malfi, Jodelle Gross, FNP  omega-3 acid ethyl esters (LOVAZA) 1 G capsule Take by mouth 2 (two) times daily.   Yes [provider]  OVER THE COUNTER MEDICATION Take 3 capsules by mouth daily. VISICLEAR - [facility] Formula   Yes [provider]  Phenylephrine-DM-GG (ROBITUSSIN MULTI-SYMPTOM MAX PO) Take 10 mLs by mouth every 4 (four) hours as needed.   Yes [provider]  rosuvastatin (CRESTOR) 10 MG tablet TAKE 1 TABLET BY MOUTH FIVE DAYS PER WEEK 12/11/19  Yes Malfi, Jodelle Gross, FNP  diphenhydrAMINE (BENADRYL) 25 mg capsule Take 25 mg by mouth Nightly.  Patient not taking: Reported on 02/29/2020    [provider]  hydrocortisone 2.5 % cream Apply topically. Patient not taking: Reported on 02/29/2020 08/26/19   [provider]  milk thistle 175 MG tablet Take 175 mg by mouth daily. Patient not taking: Reported on 02/29/2020    [provider]  Specialty Vitamins Products (ECHINACEA C COMPLETE PO) Take by mouth. Patient not taking: Reported on 02/29/2020    [provider]     Critical care time: 35 minutes    Harlon Ditty, [facility] Pager: [phone]

## 2020-03-02 NOTE — Progress Notes (Signed)
*  PRELIMINARY RESULTS* Echocardiogram 2D Echocardiogram has been performed.  Joanette Gula Javier Mamone 03/02/2020, 10:51 AM

## 2020-03-02 NOTE — Consult Note (Signed)
PHARMACY CONSULT NOTE  Pharmacy Consult for Electrolyte Monitoring and Replacement   Recent Labs: Potassium (mmol/L)  Date Value  03/02/2020 4.1   Magnesium (mg/dL)  Date Value  17/79/3903 2.0   Calcium (mg/dL)  Date Value  00/92/3300 8.3 (L)   Albumin (g/dL)  Date Value  76/22/6333 2.9 (L)  10/22/2014 4.3   Phosphorus (mg/dL)  Date Value  54/56/2563 2.4 (L)   Sodium (mmol/L)  Date Value  03/02/2020 141  10/22/2014 140   Assessment: Patient is a 75 y/o F with medical history including HTN, CKD who was admitted 2/26 with severe acute pancreatitis. Pharmacy has been consulted to assist with electrolyte monitoring and replacement as indicated.   Labs on admission notable for lipase 5191, K 2.4, Scr 1.36, LA 5.5, WBC 28.5  Diet: CLD  Goal of Therapy:  Electrolytes within normal limits  Plan:  --No electrolyte replacement warranted at this time --Starting on diet --Will follow-up all electrolytes with AM labs tomorrow  Pricilla Riffle, PharmD Clinical Pharmacist 03/02/2020 1:56 PM

## 2020-03-02 NOTE — Progress Notes (Signed)
Pt oriented X2. Pt instructed on flutter valve and incentive spiromitry use, will need re-enforcement.Tolerating clear liquids and ice chips so far. Severe abdominal pain triggered while rolling during bath. Pain medication administered. Pt unable to transfer to chair this shift due to pain, and then lethargy post pain medication. VSS. Foley removed and pt due to void. Will continue to monitor.

## 2020-03-02 NOTE — Progress Notes (Signed)
Initial Nutrition Assessment  DOCUMENTATION CODES:   Not applicable  INTERVENTION:   Boost Breeze po TID, each supplement provides 250 kcal and 9 grams of protein  MVI po daily   NUTRITION DIAGNOSIS:   Inadequate oral intake related to acute illness as evidenced by other (comment) (pt on clear liquid diet).  GOAL:   Patient will meet greater than or equal to 90% of their needs  MONITOR:   PO intake,Supplement acceptance,Diet advancement,Labs,Weight trends,Skin,I & O's  REASON FOR ASSESSMENT:   Consult Enteral/tube feeding initiation and management  ASSESSMENT:   75 y/o female with h/o seizures, CKD III, PUD, HLD and HTN who is admitted with acute pancreatitis  Met with pt in room today. Pt reports good appetite and oral intake at baseline. Pt reports that she was eating well up until the day of her admit. Pt reports that she is feeling much better today. Pt reports that she is hungry and is asking for jello. Pt reports that all she has had so far today is ice chips. Pt advanced to a clear liquid diet today. RD will add supplements and MVI to help pt meet her estimated needs. Spoke with MD, will hold off on nasogastric tube and feeds for now and see if patient is able to tolerate oral diet. Per chart, pt is weight stable at baseline.   Medications reviewed and include: heparin, insulin, MVI, protonix, zosyn  Labs reviewed: K 4.1 wnl, BUN 24(H), creat 1.21(H), P 2.4(L), Mg 2.0 wnl Wbc- 14.7(H) cbgs- 242  NUTRITION - FOCUSED PHYSICAL EXAM:  Flowsheet Row Most Recent Value  Orbital Region Mild depletion  Upper Arm Region No depletion  Thoracic and Lumbar Region No depletion  Buccal Region No depletion  Temple Region Moderate depletion  Clavicle Bone Region Moderate depletion  Clavicle and Acromion Bone Region Moderate depletion  Scapular Bone Region Mild depletion  Dorsal Hand Mild depletion  Patellar Region Severe depletion  Anterior Thigh Region Moderate depletion   Posterior Calf Region Moderate depletion  Edema (RD Assessment) None  Hair Reviewed  Eyes Reviewed  Mouth Reviewed  Skin Reviewed  Nails Reviewed     Diet Order:   Diet Order            Diet clear liquid Room service appropriate? Yes; Fluid consistency: Thin  Diet effective now                EDUCATION NEEDS:   Education needs have been addressed  Skin:  Skin Assessment: Reviewed RN Assessment  Last BM:  pta  Height:   Ht Readings from Last 1 Encounters:  02/29/20 5' 6" (1.676 m)    Weight:   Wt Readings from Last 1 Encounters:  03/01/20 70.9 kg    Ideal Body Weight:  59 kg  BMI:  Body mass index is 25.23 kg/m.  Estimated Nutritional Needs:   Kcal:  1600-1800kcal/day  Protein:  80-90g/day  Fluid:  1.6-1.8L/day  Koleen Distance MS, RD, LDN Please refer to [facility] for RD and/or RD on-call/weekend/after hours pager

## 2020-03-03 ENCOUNTER — Telehealth: Payer: Self-pay

## 2020-03-03 DIAGNOSIS — R569 Unspecified convulsions: Secondary | ICD-10-CM | POA: Diagnosis not present

## 2020-03-03 DIAGNOSIS — K8591 Acute pancreatitis with uninfected necrosis, unspecified: Secondary | ICD-10-CM | POA: Diagnosis not present

## 2020-03-03 LAB — CBC WITH DIFFERENTIAL/PLATELET
Abs Immature Granulocytes: 0.11 10*3/uL — ABNORMAL HIGH (ref 0.00–0.07)
Basophils Absolute: 0 10*3/uL (ref 0.0–0.1)
Basophils Relative: 0 %
Eosinophils Absolute: 0 10*3/uL (ref 0.0–0.5)
Eosinophils Relative: 0 %
HCT: 36.6 % (ref 36.0–46.0)
Hemoglobin: 11.9 g/dL — ABNORMAL LOW (ref 12.0–15.0)
Immature Granulocytes: 1 %
Lymphocytes Relative: 10 %
Lymphs Abs: 1 10*3/uL (ref 0.7–4.0)
MCH: 31.5 pg (ref 26.0–34.0)
MCHC: 32.5 g/dL (ref 30.0–36.0)
MCV: 96.8 fL (ref 80.0–100.0)
Monocytes Absolute: 1.1 10*3/uL — ABNORMAL HIGH (ref 0.1–1.0)
Monocytes Relative: 10 %
Neutro Abs: 8.3 10*3/uL — ABNORMAL HIGH (ref 1.7–7.7)
Neutrophils Relative %: 79 %
Platelets: 103 10*3/uL — ABNORMAL LOW (ref 150–400)
RBC: 3.78 MIL/uL — ABNORMAL LOW (ref 3.87–5.11)
RDW: 13.2 % (ref 11.5–15.5)
Smear Review: NORMAL
WBC: 10.5 10*3/uL (ref 4.0–10.5)
nRBC: 0 % (ref 0.0–0.2)

## 2020-03-03 LAB — COMPREHENSIVE METABOLIC PANEL
ALT: 17 U/L (ref 0–44)
AST: 42 U/L — ABNORMAL HIGH (ref 15–41)
Albumin: 2.5 g/dL — ABNORMAL LOW (ref 3.5–5.0)
Alkaline Phosphatase: 37 U/L — ABNORMAL LOW (ref 38–126)
Anion gap: 6 (ref 5–15)
BUN: 24 mg/dL — ABNORMAL HIGH (ref 8–23)
CO2: 23 mmol/L (ref 22–32)
Calcium: 8 mg/dL — ABNORMAL LOW (ref 8.9–10.3)
Chloride: 112 mmol/L — ABNORMAL HIGH (ref 98–111)
Creatinine, Ser: 1.09 mg/dL — ABNORMAL HIGH (ref 0.44–1.00)
GFR, Estimated: 53 mL/min — ABNORMAL LOW (ref >60)
Glucose, Bld: 267 mg/dL — ABNORMAL HIGH (ref 70–99)
Potassium: 3.4 mmol/L — ABNORMAL LOW (ref 3.5–5.1)
Sodium: 141 mmol/L (ref 135–145)
Total Bilirubin: 2 mg/dL — ABNORMAL HIGH (ref 0.3–1.2)
Total Protein: 5.3 g/dL — ABNORMAL LOW (ref 6.5–8.1)

## 2020-03-03 LAB — GLUCOSE, CAPILLARY
Glucose-Capillary: 328 mg/dL — ABNORMAL HIGH (ref 70–99)
Glucose-Capillary: 292 mg/dL — ABNORMAL HIGH (ref 70–99)
Glucose-Capillary: 291 mg/dL — ABNORMAL HIGH (ref 70–99)
Glucose-Capillary: 142 mg/dL — ABNORMAL HIGH (ref 70–99)

## 2020-03-03 LAB — PHOSPHORUS
Phosphorus: 1.5 mg/dL — ABNORMAL LOW (ref 2.5–4.6)
Phosphorus: 2.8 mg/dL (ref 2.5–4.6)

## 2020-03-03 LAB — MAGNESIUM: Magnesium: 1.9 mg/dL (ref 1.7–2.4)

## 2020-03-03 LAB — HEMOGLOBIN A1C
Hgb A1c MFr Bld: 5.8 % — ABNORMAL HIGH (ref 4.8–5.6)
Mean Plasma Glucose: 119.76 mg/dL

## 2020-03-03 LAB — PROCALCITONIN: Procalcitonin: 1.78 ng/mL

## 2020-03-03 MED ORDER — INSULIN ASPART 100 UNIT/ML ~~LOC~~ SOLN
0.0000 [IU] | Freq: Three times a day (TID) | SUBCUTANEOUS | Status: DC
  Administered 2020-03-03 (×2): 11 [IU] via SUBCUTANEOUS
  Administered 2020-03-04: 3 [IU] via SUBCUTANEOUS
  Administered 2020-03-04: 4 [IU] via SUBCUTANEOUS
  Administered 2020-03-04: 3 [IU] via SUBCUTANEOUS
  Administered 2020-03-05: 4 [IU] via SUBCUTANEOUS
  Administered 2020-03-05 (×2): 3 [IU] via SUBCUTANEOUS
  Administered 2020-03-06: 4 [IU] via SUBCUTANEOUS
  Administered 2020-03-07: 7 [IU] via SUBCUTANEOUS
  Administered 2020-03-08 – 2020-03-09 (×3): 4 [IU] via SUBCUTANEOUS
  Administered 2020-03-09: 3 [IU] via SUBCUTANEOUS
  Administered 2020-03-09 – 2020-03-11 (×4): 4 [IU] via SUBCUTANEOUS
  Administered 2020-03-11: 7 [IU] via SUBCUTANEOUS
  Administered 2020-03-12 – 2020-03-13 (×3): 3 [IU] via SUBCUTANEOUS
  Administered 2020-03-13: 4 [IU] via SUBCUTANEOUS
  Filled 2020-03-03 (×23): qty 1

## 2020-03-03 MED ORDER — INSULIN DETEMIR 100 UNIT/ML ~~LOC~~ SOLN
10.0000 [IU] | Freq: Every day | SUBCUTANEOUS | Status: DC
  Administered 2020-03-03 – 2020-03-08 (×6): 10 [IU] via SUBCUTANEOUS
  Filled 2020-03-03 (×7): qty 0.1

## 2020-03-03 MED ORDER — MAGNESIUM SULFATE 2 GM/50ML IV SOLN
2.0000 g | Freq: Once | INTRAVENOUS | Status: AC
  Administered 2020-03-03: 2 g via INTRAVENOUS
  Filled 2020-03-03: qty 50

## 2020-03-03 MED ORDER — POTASSIUM PHOSPHATES 15 MMOLE/5ML IV SOLN
45.0000 mmol | Freq: Once | INTRAVENOUS | Status: AC
  Administered 2020-03-03: 45 mmol via INTRAVENOUS
  Filled 2020-03-03: qty 15

## 2020-03-03 MED ORDER — INSULIN ASPART 100 UNIT/ML ~~LOC~~ SOLN
0.0000 [IU] | Freq: Every day | SUBCUTANEOUS | Status: DC
  Administered 2020-03-10: 2 [IU] via SUBCUTANEOUS
  Filled 2020-03-03: qty 1

## 2020-03-03 MED ORDER — HYDROCODONE-ACETAMINOPHEN 5-325 MG PO TABS
1.0000 | ORAL_TABLET | Freq: Four times a day (QID) | ORAL | Status: DC | PRN
  Administered 2020-03-03: 2 via ORAL
  Administered 2020-03-04 – 2020-03-05 (×2): 1 via ORAL
  Administered 2020-03-05: 2 via ORAL
  Administered 2020-03-06 – 2020-03-14 (×10): 1 via ORAL
  Filled 2020-03-03 (×2): qty 2
  Filled 2020-03-03: qty 1
  Filled 2020-03-03 (×2): qty 2
  Filled 2020-03-03 (×4): qty 1
  Filled 2020-03-03: qty 2
  Filled 2020-03-03 (×4): qty 1

## 2020-03-03 NOTE — Telephone Encounter (Signed)
Copied from CRM [id number]. Topic: General - Other >> Mar 03, 2020  3:42 PM Aretta Nip wrote: Pt son, Ida Rogue mother is pt and was admitted Saturday 2/27 [facility]  Necrotizing pancreatitis and is requesting to make a HFU as soon as mother is discharged/ will wait till closer to DC and wants to make sure Dr K aware of situation and will be able to make referrals, Home Health Care and work with them. Assured son he would be able at [facility] to assist and son is just wanted touch base to alert PCP. Andrew's phone [phone].Will call when needed, states mom case is a miracle but still aways to go. States to just make Dr Kirtland Bouchard aware.

## 2020-03-03 NOTE — Progress Notes (Signed)
PT Cancellation Note  Patient Details Name: Holly Navarro MRN: [id number] DOB: 02-07-1945   Cancelled Treatment:    Reason Eval/Treat Not Completed: Other (comment). Evaluation attempted. Pt received in bed. Pt currently refusing to work with therapy and reports she doesn't want therapy services despite encouragement. Refuses in bed and OOB mobility despite heavy encouragement. RN and MD updated. Will complete current order. Please re-order if she changes her mind.   Tayden Nichelson 03/03/2020, 3:21 PM Elizabeth Palau, PT, DPT [phone]

## 2020-03-03 NOTE — Procedures (Signed)
Patient Name: Holly Navarro  MRN: [id number]  Epilepsy Attending: Charlsie Quest  Referring Physician/Provider: Dr Elyn Aquas Date: 03/03/2020 Duration: 21.32 mins  Patient history: 75yo F with ams and seizure like activity. EEG to evaluate for seizure  Level of alertness: Awake  AEDs during EEG study: LEV  Technical aspects: This EEG study was done with scalp electrodes positioned according to the 10-20 International system of electrode placement. Electrical activity was acquired at a sampling rate of 500Hz  and reviewed with a high frequency filter of 70Hz  and a low frequency filter of 1Hz . EEG data were recorded continuously and digitally stored.   Description: The posterior dominant rhythm consists of 9 Hz activity of moderate voltage (25-35 uV) seen predominantly in posterior head regions, symmetric and reactive to eye opening and eye closing.EEG showed intermittent generalized 3 to 6 Hz theta-delta slowing. Intermittent generalized periodic discharges with triphasic morphology were noted at 1hz . Hyperventilation and photic stimulation were not performed.     ABNORMALITY -Continuous slow, generalized - Periodic  Discharges with triphasic morphology, generalized  IMPRESSION: This study is suggestive of mild diffuse encephalopathy, nonspecific etiology. Intermittent generalized periodic discharges with triphasic morphology were noted at 1hz  which can be on the ictal-interictal continuum but given the morphology and frequency, are more likely related to toxic-metabolic etiology. No seizures or definite epileptiform discharges were seen throughout the recording.  Holly Navarro 

## 2020-03-03 NOTE — Plan of Care (Signed)
Pt continues to improve. Has been oriented to all except time today. BP stable, HR 100-120, O2 within goal on 3L [place]. Norco added to regimen. Pt reluctant to try but did agree this afternoon. Still needed dilaudid dose within an hour of oral pain med, reported norco effect insufficient. Dilaudid is treating pain well. Did agree to sit on edge of bed with RN this morning but after that refused all mobility and fired PT, saying she is too tired to get out of bed. Nephew at bedside for much of day. Diet advanced to full liquid, tolerating well. Voiding with purewick. Insulin coverage increased today. Encouraging IS and flutter valve. Children updated over phone.   Problem: Education: Goal: Knowledge of General Education information will improve Description: Including pain rating scale, medication(s)/side effects and non-pharmacologic comfort measures Outcome: Progressing   Problem: Health Behavior/Discharge Planning: Goal: Ability to manage health-related needs will improve Outcome: Progressing   Problem: Clinical Measurements: Goal: Ability to maintain clinical measurements within normal limits will improve Outcome: Progressing Goal: Will remain free from infection Outcome: Progressing Goal: Diagnostic test results will improve Outcome: Progressing Goal: Respiratory complications will improve Outcome: Progressing Goal: Cardiovascular complication will be avoided Outcome: Progressing   Problem: Activity: Goal: Risk for activity intolerance will decrease Outcome: Progressing   Problem: Nutrition: Goal: Adequate nutrition will be maintained Outcome: Progressing   Problem: Coping: Goal: Level of anxiety will decrease Outcome: Progressing   Problem: Elimination: Goal: Will not experience complications related to bowel motility Outcome: Progressing Goal: Will not experience complications related to urinary retention Outcome: Progressing   Problem: Pain Managment: Goal: General  experience of comfort will improve Outcome: Progressing   Problem: Safety: Goal: Ability to remain free from injury will improve Outcome: Progressing   Problem: Skin Integrity: Goal: Risk for impaired skin integrity will decrease Outcome: Progressing

## 2020-03-03 NOTE — Telephone Encounter (Signed)
A couple updates from my perspective.  I am happy to help at a HFU apt and they will schedule that prior to discharge. I will review her hospital records prior to the appointment and also get updates directly from them at the time of the appointment.  Most importantly with regards to Home Health orders - typically these are ordered and arranged by the discharging hospital doctor prior to leaving the hospital.  Usually home health will setup with a patient in the first few days of leaving the hospital, and a routine HFU apt is within 1-2 weeks.  Insurance is much more likely to cover and provide all needed services for patients leaving the hospital - with Home Health, rather than if ordered OUTSIDE the hospital, it is more challenging to arrange home health to start promptly if we order it AFTER she gets out of the hospital.  So, FYI - when this is discussed with patient / son in hospital, they should try to make sure this is all ordered prior to leaving the hospital to avoid any delay.  Saralyn Pilar, DO [facility] 03/03/2020, 5:22 PM
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
## 2020-03-03 NOTE — Progress Notes (Addendum)
NAME:  Holly Navarro, MRN:  [id number], DOB:  1945/03/08, LOS: 3 ADMISSION DATE:  02/29/2020, CONSULTATION DATE:  02/29/2020 REFERRING MD:  Dr. Elesa Massed, CHIEF COMPLAINT:  Acute Pancreatitis  Brief History:  75 y.o. Female admitted with severe sepsis in the setting of severe acute necrotizing pancreatitis.  History of Present Illness:  Holly A Ryanis a 75 y.o.femalewith history of hypertension, hyperlipidemia, gastric ulcer who presented to [facility] emergency department for sudden onset diffuse severe sharp abdominal pain. The discomfort began  Around 10 PM last night and was associated with onset of nausea and cramps. She did vomit and became diaphoretic after emesis. The pain did radiate across her entire abdomen and to her back. There was no associated diarrhea or lose stool and no hematochezia or hematemesis. She was found to be hypothermic and hypertensive upon her arrival she was warmed and started on fluids. Her evaluation returned a lactic acid of 6. There was a leukocytosis in excess of 20. A CT revealed necrotizing pancreatitis with a corroborating lipase. The case was discussed with our local surgeon who felt she may be best served in transfer. The case was then d/w with [facility] who explained there were no beds and without acute intervention planned, she would likely need to wait and be stabilized at [facility].  Case d/w the patient's son [name]: [phone], he will mobilize family for discussion of her care and visitation from out of state ([place] and [place]).  Past Medical History:    Anxiety    Arthritis    Cardiac arrhythmia    Hyperlipidemia    Stomach ulcer      Significant Hospital Events:  2/26: Admission to ICU  Consults:  PCCM Neurology  Procedures:  N/A  Significant Diagnostic Tests:  2/26: CXR>>The lungs are symmetrically well expanded. Benign calcified granuloma within the right mid lung zone. Nodular density at the left  lung base likely represents a confluence of vascular and osseous shadow. The lungs are otherwise clear. No pneumothorax or pleural effusion. Cardiac size within normal limits. No acute bone Abnormality. 2/26: KUB>>Normal abdominal gas pattern. Inferior pelvis excluded from view. No definite gross free intraperitoneal gas. No organomegaly. Vascular calcifications are seen within the abdominal aorta. Osseous structures are age-appropriate. 2/26: CT Abdomen & Pelvis>>1. Severe Acute Pancreatitis, with subtotal associated Pancreatic Necrosis. 2. Moderate volume free fluid and inflammation throughout the upper abdomen. No organized or rim enhancing fluid collection at this time. No vascular occlusion or other complicating features. 3. Infrarenal abdominal aortic aneurysm measuring 31 mm. Recommend follow-up Ultrasound every 3 years. This recommendation follows ACR consensus guidelines: White Paper of the ACR Incidental Findings Committee II on Vascular Findings. J Am Coll Radiol 2013; 32:951-884. 4. Aortic Atherosclerosis (ICD10-I70.0) and Emphysema (ICD10-J43.9). 2/26: CT Head>>No acute intracranial abnormality.  Normal examination. 2/26: US Abdomen (RUQ)>>1. Trace volume of ascites adjacent to the liver. 2. Otherwise, unremarkable examination. Specifically, no gallstones, signs of acute cholecystitis, or evidence of biliary tract Dilatation. 2/28: Echocardiogram>>1. Left ventricular ejection fraction, by estimation, is 65 to 70%. The  left ventricle has normal function. The left ventricle has no regional  wall motion abnormalities. Left ventricular diastolic parameters are  consistent with Grade I diastolic  dysfunction (impaired relaxation).  2. Right ventricular systolic function is normal. The right ventricular  size is normal. Tricuspid regurgitation signal is inadequate for assessing  PA pressure.  3. The mitral valve is normal in structure. No evidence of mitral valve   regurgitation. No evidence  of mitral stenosis.  4. The aortic valve is normal in structure. Aortic valve regurgitation is  not visualized. Mild aortic valve stenosis. Aortic valve area, by VTI  measures 2.07 cm. Aortic valve mean gradient measures 6.0 mmHg.  5. The inferior vena cava is normal in size with greater than 50%  respiratory variability, suggesting right atrial pressure of 3 mmHg 2/28:CXR>>Normal heart size, mediastinal contours, and pulmonary vascularity. LEFT pleural effusion new since previous exam with LEFT basilar atelectasis.Subsegmental atelectasis at RIGHT base as well. Upper lungs clear. No pneumothorax. Bones demineralized.  Micro Data:  2/26: SARS-CoV-2 PCR>> negative 2/26: Influenza A&B PCR>> negative 2/26: Blood culture x2>> no growth to date 2/26: MRSA PCR>> negative  Antimicrobials:  Cefepime 2/26>> 2/27 Flagyl 2/26>> 2/27 Zosyn 2/27>>  Interim History / Subjective:  -No acute events noted overnight -Afebrile, Hemodynamically stable, NO vasopressors, on 3L nasal cannula -Creatinine continues to slowly improve (1.09 today), 950 ml urine output yesterday -Reports 2/10 Epigastric abdominal pain, well controlled with Dilaudid -Denies chest pain, HA, dizziness, palpitation, N/V/D, dysuria, edema, fever/chills -Tolerating clear liquids, will advance to full liquid diet today -CBG's ranging from 222 to 328 from last night till this morning, will change to resistant SSI and add 10 units Levemir  Objective   Blood pressure (!) 169/83, pulse (!) 102, temperature 98.1 F (36.7 C), temperature source Oral, resp. rate (!) 23, height 5\' 6"  (1.676 m), weight 70.9 kg, SpO2 91 %.        Intake/Output Summary (Last 24 hours) at 03/03/2020 0819 Last data filed at 03/03/2020 0602 Gross per 24 hour  Intake 408.22 ml  Output 950 ml  Net -541.78 ml   Filed Weights   02/29/20 0059 02/29/20 0941 03/01/20 0500  Weight: 67.1 kg 70.6 kg 70.9 kg    Examination: General:  Acutely ill-appearing female, sitting in bed, eating breakfast, on 3 L nasal cannula, no acute distress  HENT: Atraumatic, normocephalic, neck supple, no JVD Lungs: Clear to auscultation bilaterally, no wheezing or rales noted, even, nonlabored, normal effort Cardiovascular: Tachycardia, regular rhythm, sinus tachycardia on telemetry, S1-S2, no murmurs, rubs, gallops, 2+ distal pulses Abdomen: Soft, tender, nondistended, no guarding or rebound tenderness, bowel sounds positive x4 Extremities: Normal bulk and tone, no edema, no deformities Neuro: Awake, alert and oriented x4, follows commands, no focal deficits, speech clear Skin: Warm and dry.  No obvious rashes, lesions, ulcerations  Resolved Hospital Problem list   N/A  Assessment & Plan:   Acute Necrotizing Pancreatitis -No role for surgery or perc drain at this time, there is not a discrete collection that would benefit -There is no obvious intervention required at the level of the gallbladder or duct -[facility] if come to that point -Monitor fever curve -Trend WBC's & Procalcitonin -Follow pan cultures as above -Continue Zosyn -Lactic has normalized -Ethanol level <10, Triglycerides 169 -RUQ Abdominal US on 2/26 without gallstones, acute cholecystitis, or biliary tract dilatation -Pain control -Encourage PO intake, tolerating clear liquids well ~ advance to Full liquids diet today   Acute Hypoxic Respiratory Failure in the setting of Atelectasis and Left Pleural Effusion -Supplemental O2 as needed to maintain O2 sats >92% -Follow intermittent CXR and ABG as needed -CXR on 2/28 with new LEFT pleural effusion with bibasilar atelectasis greater on LEFT -Suspect pleural effusion is due to Pancreatitis -ECHO on 03/02/20 with LVEF 65-70%, grade I diastolic dysfunction -Mobilize as able -Incentive spirometry & Flutter valve   Acute  Metabolic  Encephalopathy>>resolved Generalized Tonic-Clonic Seizure -Provide supportive care -Seizure precautions -Continue Keppra -CT Head on 2/26 negative for any acute intracranial abnormality -EEG pending -Neurology consulted, appreciate input   Acute Kidney Injury>>improving -Monitor I&O's / urinary output -Follow BMP -Ensure adequate renal perfusion -Avoid nephrotoxic agents as able -Replace electrolytes as indicated -Pharmacy following for assistance in electrolyte replacement -Encourage PO intake   Hyperglycemia -CBG's -Sliding scale insulin; will Step up to Resistant scale SSI and  add 10 units Levemir today 03/03/20 -Follow ICU Hypo/hyperglycemia protocol -Check Hgb A1c    Best practice (evaluated daily)  Diet: Full Liquid Pain/Anxiety/Delirium protocol (if indicated): Prn Dilaudid VAP protocol (if indicated): N/A DVT prophylaxis: Heparin SQ GI prophylaxis: Protonix IV Glucose control: SSI, 10 units Levemir Mobility: As tolerated Disposition: Stepdown  Goals of Care:  Last date of multidisciplinary goals of care discussion:03/03/2020 Family and staff present: APP, Rn, and pt Summary of discussion: Advance diet as tolerated, continue ABX, pain control, mobilize as able, incentive spirometry and flutter valve Follow up goals of care discussion due: 03/04/2020 Code Status: DNR/DNI  Labs   CBC: Recent Labs  Lab 02/29/20 0110 02/29/20 1551 03/01/20 0628 03/02/20 0625 03/03/20 0331  WBC 28.5* 16.0* 16.5* 14.7* 10.5  NEUTROABS 22.3*  --  13.3*  --  8.3*  HGB 17.5* 17.0* 15.0 13.5 11.9*  HCT 51.9* 50.2* 45.1 41.3 36.6  MCV 94.7 94.7 94.0 95.4 96.8  PLT 273 184 141* 124* 103*    Basic Metabolic Panel: Recent Labs  Lab 02/29/20 0110 02/29/20 0956 02/29/20 1551 02/29/20 1750 02/29/20 2148 03/01/20 0628 03/02/20 0625 03/03/20 0331  NA 145  --  135  --  138 139 141 141  K 2.4* 5.1 5.7* 5.1 4.4 4.5 4.1 3.4*  CL 112*  --  109  --  108 109 111 112*  CO2 20*  --   14*  --  18* 21* 23 23  GLUCOSE 195*  --  232*  --  213* 218* 221* 267*  BUN 14  --  16  --  18 20 24* 24*  CREATININE 1.36*  --  1.61*  --  1.30* 1.31* 1.21* 1.09*  CALCIUM 8.5*  --  8.6*  --  8.1* 8.2* 8.3* 8.0*  MG 2.2  --  2.0  --   --  2.0 2.0 1.9  PHOS  --  3.6  --   --   --  3.5 2.4* 1.5*   GFR: Estimated Creatinine Clearance: 42.4 mL/min (A) (by C-G formula based on SCr of 1.09 mg/dL (H)). Recent Labs  Lab 02/29/20 0455 02/29/20 0956 02/29/20 1551 02/29/20 2148 03/01/20 0628 03/02/20 0625 03/03/20 0331  PROCALCITON  --   --   --  1.60 1.55 2.03 1.78  WBC  --   --  16.0*  --  16.5* 14.7* 10.5  LATICACIDVEN 5.5* 9.4*  --  3.1* 1.9  --   --     Liver Function Tests: Recent Labs  Lab 02/29/20 0110 03/01/20 0628 03/03/20 0331  AST 25 44* 42*  ALT 16 14 17   ALKPHOS 51 39 37*  BILITOT 0.5 1.5* 2.0*  PROT 6.7 5.3* 5.3*  ALBUMIN 4.0 2.9* 2.5*   Recent Labs  Lab 02/29/20 0110  LIPASE 5,191*   No results for input(s): AMMONIA in the last 168 hours.  ABG    Component Value Date/Time   PHART 7.26 (L) 02/29/2020 0940   PCO2ART 30 (L) 02/29/2020 0940   PO2ART 72 (L) 02/29/2020 0940  HCO3 13.5 (L) 02/29/2020 0940   ACIDBASEDEF 12.0 (H) 02/29/2020 0940   O2SAT 91.5 02/29/2020 0940     Coagulation Profile: No results for input(s): INR, PROTIME in the last 168 hours.  Cardiac Enzymes: No results for input(s): CKTOTAL, CKMB, CKMBINDEX, TROPONINI in the last 168 hours.  HbA1C: No results found for: HGBA1C  CBG: Recent Labs  Lab 02/29/20 0943 03/02/20 1117 03/02/20 1558 03/02/20 2107 03/03/20 0755  GLUCAP 233* 242* 235* 222* 328*    Review of Systems:   Positives in BOLD: Gen: Denies fever, chills, weight change, fatigue, night sweats HEENT: Denies blurred vision, double vision, hearing loss, tinnitus, sinus congestion, rhinorrhea, sore throat, neck stiffness, dysphagia PULM: Denies shortness of breath, cough, sputum production, hemoptysis,  wheezing CV: Denies chest pain, edema, orthopnea, paroxysmal nocturnal dyspnea, palpitations GI: Denies +abdominal pain, nausea, vomiting, diarrhea, hematochezia, melena, constipation, change in bowel habits GU: Denies dysuria, hematuria, polyuria, oliguria, urethral discharge Endocrine: Denies hot or cold intolerance, polyuria, polyphagia or appetite change Derm: Denies rash, dry skin, scaling or peeling skin change Heme: Denies easy bruising, bleeding, bleeding gums Neuro: Denies headache, numbness, weakness, slurred speech, loss of memory or consciousness   Past Medical History:  She,  has a past medical history of Anxiety, Arthritis, Cardiac arrhythmia, Hyperlipidemia, and Stomach ulcer.   Surgical History:   Past Surgical History:  Procedure Laterality Date   CYSTECTOMY       Social History:   reports that she has been smoking cigarettes. She has been smoking about 0.50 packs per day. She has never used smokeless tobacco. She reports current alcohol use. She reports that she does not use drugs.   Family History:  Her family history includes Cancer in her brother; Diabetes in her father; Heart disease in her mother; Kidney disease in her mother.   Allergies No Known Allergies   Home Medications  Prior to Admission medications   Medication Sig Start Date End Date Taking? Authorizing Provider  atenolol (TENORMIN) 50 MG tablet Take 1 tablet (50 mg total) by mouth daily. 12/11/19  Yes Malfi, Jodelle Gross, FNP  cimetidine (TAGAMET) 200 MG tablet Take 200 mg by mouth 2 (two) times daily.   Yes [provider]  co-enzyme Q-10 30 MG capsule Take 30 mg by mouth 3 (three) times daily.   Yes [provider]  cyclobenzaprine (FLEXERIL) 10 MG tablet Take 10 mg by mouth 3 (three) times daily as needed for muscle spasms.   Yes [provider]  Dextromethorphan-guaiFENesin (CORICIDIN HBP CONGESTION/COUGH) 10-200 MG CAPS Take 2 capsules by mouth daily as needed.   Yes  [provider]  lisinopril (ZESTRIL) 10 MG tablet Take 1 tablet (10 mg total) by mouth daily. 12/11/19  Yes Malfi, Jodelle Gross, FNP  omega-3 acid ethyl esters (LOVAZA) 1 G capsule Take by mouth 2 (two) times daily.   Yes [provider]  OVER THE COUNTER MEDICATION Take 3 capsules by mouth daily. VISICLEAR - [facility] Formula   Yes [provider]  Phenylephrine-DM-GG (ROBITUSSIN MULTI-SYMPTOM MAX PO) Take 10 mLs by mouth every 4 (four) hours as needed.   Yes [provider]  rosuvastatin (CRESTOR) 10 MG tablet TAKE 1 TABLET BY MOUTH FIVE DAYS PER WEEK 12/11/19  Yes Malfi, Jodelle Gross, FNP  diphenhydrAMINE (BENADRYL) 25 mg capsule Take 25 mg by mouth Nightly.  Patient not taking: Reported on 02/29/2020    [provider]  hydrocortisone 2.5 % cream Apply topically. Patient not taking: Reported on 02/29/2020  08/26/19   [provider]  milk thistle 175 MG tablet Take 175 mg by mouth daily. Patient not taking: Reported on 02/29/2020    [provider]  Specialty Vitamins Products (ECHINACEA C COMPLETE PO) Take by mouth. Patient not taking: Reported on 02/29/2020    [provider]     Critical care time: 56 minutes    Harlon Ditty, AGACNP-BC [facility] Pager: [phone]   PCCM ATTENDING ATTESTATION:  I have evaluated patient with the APP, I personally  reviewed database in its entirety and discussed care plan in detail. In addition, this patient was discussed on multidisciplinary rounds.   I agree with assessment and plan.  Pain better Creatinine better Can transfer to [facility]   Lucie Leather, M.D.  [facility]  Medical Director [facility] Medical Director [facility] Cardio-Pulmonary Department

## 2020-03-03 NOTE — Consult Note (Signed)
PHARMACY CONSULT NOTE  Pharmacy Consult for Electrolyte Monitoring and Replacement   Recent Labs: Potassium (mmol/L)  Date Value  03/03/2020 3.4 (L)   Magnesium (mg/dL)  Date Value  95/28/4132 1.9   Calcium (mg/dL)  Date Value  44/01/270 8.0 (L)   Albumin (g/dL)  Date Value  53/66/4403 2.5 (L)  10/22/2014 4.3   Phosphorus (mg/dL)  Date Value  47/42/5956 1.5 (L)   Sodium (mmol/L)  Date Value  03/03/2020 141  10/22/2014 140   Assessment: Patient is a 75 y/o F with medical history including HTN, CKD who was admitted 2/26 with severe acute pancreatitis. Pharmacy has been consulted to assist with electrolyte monitoring and replacement as indicated.   Labs on admission notable for lipase 5191, K 2.4, Scr 1.36, LA 5.5, WBC 28.5  Diet: CLD  Goal of Therapy:  Electrolytes within normal limits  Plan:  --Potassium phos complete --Diet advanced today --Will follow-up all electrolytes with AM labs tomorrow  Pricilla Riffle, PharmD Clinical Pharmacist 03/03/2020 1:57 PM

## 2020-03-03 NOTE — Progress Notes (Signed)
eeg done °

## 2020-03-04 DIAGNOSIS — R569 Unspecified convulsions: Secondary | ICD-10-CM | POA: Diagnosis not present

## 2020-03-04 DIAGNOSIS — K8591 Acute pancreatitis with uninfected necrosis, unspecified: Secondary | ICD-10-CM | POA: Diagnosis not present

## 2020-03-04 DIAGNOSIS — N179 Acute kidney failure, unspecified: Secondary | ICD-10-CM | POA: Diagnosis not present

## 2020-03-04 LAB — COMPREHENSIVE METABOLIC PANEL
ALT: 16 U/L (ref 0–44)
AST: 26 U/L (ref 15–41)
Albumin: 2.5 g/dL — ABNORMAL LOW (ref 3.5–5.0)
Alkaline Phosphatase: 50 U/L (ref 38–126)
Anion gap: 6 (ref 5–15)
BUN: 18 mg/dL (ref 8–23)
CO2: 25 mmol/L (ref 22–32)
Calcium: 8.1 mg/dL — ABNORMAL LOW (ref 8.9–10.3)
Chloride: 107 mmol/L (ref 98–111)
Creatinine, Ser: 1.16 mg/dL — ABNORMAL HIGH (ref 0.44–1.00)
GFR, Estimated: 49 mL/min — ABNORMAL LOW (ref >60)
Glucose, Bld: 126 mg/dL — ABNORMAL HIGH (ref 70–99)
Potassium: 3.4 mmol/L — ABNORMAL LOW (ref 3.5–5.1)
Sodium: 138 mmol/L (ref 135–145)
Total Bilirubin: 1.5 mg/dL — ABNORMAL HIGH (ref 0.3–1.2)
Total Protein: 5.7 g/dL — ABNORMAL LOW (ref 6.5–8.1)

## 2020-03-04 LAB — CBC WITH DIFFERENTIAL/PLATELET
Abs Immature Granulocytes: 0.27 10*3/uL — ABNORMAL HIGH (ref 0.00–0.07)
Basophils Absolute: 0.1 10*3/uL (ref 0.0–0.1)
Basophils Relative: 1 %
Eosinophils Absolute: 0.1 10*3/uL (ref 0.0–0.5)
Eosinophils Relative: 1 %
HCT: 35.1 % — ABNORMAL LOW (ref 36.0–46.0)
Hemoglobin: 11.6 g/dL — ABNORMAL LOW (ref 12.0–15.0)
Immature Granulocytes: 2 %
Lymphocytes Relative: 13 %
Lymphs Abs: 1.6 10*3/uL (ref 0.7–4.0)
MCH: 31.8 pg (ref 26.0–34.0)
MCHC: 33 g/dL (ref 30.0–36.0)
MCV: 96.2 fL (ref 80.0–100.0)
Monocytes Absolute: 1.8 10*3/uL — ABNORMAL HIGH (ref 0.1–1.0)
Monocytes Relative: 14 %
Neutro Abs: 9 10*3/uL — ABNORMAL HIGH (ref 1.7–7.7)
Neutrophils Relative %: 69 %
Platelets: 131 10*3/uL — ABNORMAL LOW (ref 150–400)
RBC: 3.65 MIL/uL — ABNORMAL LOW (ref 3.87–5.11)
RDW: 13.2 % (ref 11.5–15.5)
Smear Review: NORMAL
WBC: 12.8 10*3/uL — ABNORMAL HIGH (ref 4.0–10.5)
nRBC: 0 % (ref 0.0–0.2)

## 2020-03-04 LAB — GLUCOSE, CAPILLARY
Glucose-Capillary: 199 mg/dL — ABNORMAL HIGH (ref 70–99)
Glucose-Capillary: 150 mg/dL — ABNORMAL HIGH (ref 70–99)
Glucose-Capillary: 121 mg/dL — ABNORMAL HIGH (ref 70–99)
Glucose-Capillary: 114 mg/dL — ABNORMAL HIGH (ref 70–99)

## 2020-03-04 LAB — PROCALCITONIN: Procalcitonin: 1.32 ng/mL

## 2020-03-04 LAB — MAGNESIUM: Magnesium: 2 mg/dL (ref 1.7–2.4)

## 2020-03-04 LAB — PHOSPHORUS: Phosphorus: 2.4 mg/dL — ABNORMAL LOW (ref 2.5–4.6)

## 2020-03-04 MED ORDER — POTASSIUM CHLORIDE CRYS ER 20 MEQ PO TBCR
40.0000 meq | EXTENDED_RELEASE_TABLET | Freq: Once | ORAL | Status: AC
  Administered 2020-03-04: 40 meq via ORAL
  Filled 2020-03-04: qty 2

## 2020-03-04 NOTE — Progress Notes (Signed)
   03/04/20 1700  Clinical Encounter Type  Visited With Family;Patient and family together  Visit Type Spiritual support;Initial  Referral From Nurse  Consult/Referral To Chaplain  Recommendations Assist with AD finalization on 03/05/20   This chaplain received a call from the ICU requesting assistance with updating or creating an AD for the patient. Upon arrival, this chaplain talked with the patient's son, Tammy Sours, who shared that he is assisting his mother with completing the document. Tammy Sours also reported that his mother/the patient is feeling much better than when she initially arrived, for which they are grateful and surprised given how sick she was. Tammy Sours inquired about next steps in the completion of the AD. This chaplain recommended that we attempt to finalize tomorrow during regular business hours in the interest of assembling each of the necessary persons (notary and two witnesses). He expressed understanding and will be in touch with any questions or concerns.  The patient's son also inquired about our current visitor policy and how to best accommodate family who would like to visit with the patient. This chaplain offered to confirm our current policy and be back in touch.   Clovis Riley, Chaplain

## 2020-03-04 NOTE — Progress Notes (Signed)
PRN dilaudid given x2 overnight for acute abd pain w/ positive effects. Pt voided mx times on pad and purewick. Pt ox3. Pt in no acute distress @ this time. Will continue to monitor.

## 2020-03-04 NOTE — Progress Notes (Signed)
PROGRESS NOTE    Holly Navarro  RCV:[id number] DOB: 03-Feb-1945 DOA: 02/29/2020 PCP: Tarri Fuller, FNP    Brief Narrative:  Holly Navarro a 75 y.o.femalewith history of hypertension, hyperlipidemia, gastric ulcer [facility] diffuse severe sharp abdominal pain. The discomfort began Around 10 PM last nightand was associated with onset of nausea and cramps.She did vomit and became diaphoretic after emesis. The pain did radiate across her entire abdomen and to her back. There was no associated diarrhea or lose stool and no hematochezia or hematemesis. She was found to be hypothermic and hypertensive upon her arrival she was warmed and started on fluids. Her evaluation returned a lactic acid of 6. There was a leukocytosis in excess of 20. A CT revealed necrotizing pancreatitis with a corroborating lipase. The case was discussed with our local surgeon who felt she may be best served in transfer. The case was then d/w with [facility] Intens[facility] who explained there were no beds and without acute intervention planned, she would likely need to wait and be stabilized at [facility].  She was admitted to the ICU service on 2/26.  On 3/2 she was transferred to [facility] service.   Consultants:   PCCM, neurology  Procedures:   2/26: CXR>>The lungs are symmetrically well expanded. Benign calcified granuloma within the right mid lung zone. Nodular density at the left lung base likely represents a confluence of vascular and osseous shadow. The lungs are otherwise clear. No pneumothorax or pleural effusion. Cardiac size within normal limits. No acute bone Abnormality. 2/26: KUB>>Normal abdominal gas pattern. Inferior pelvis excluded from view. No definite gross free intraperitoneal gas. No organomegaly. Vascular calcifications are seen within the abdominal aorta. Osseous structures are age-appropriate. 2/26: CT Abdomen & Pelvis>>1. Severe Acute  Pancreatitis, with subtotal associated Pancreatic Necrosis. 2. Moderate volume free fluid and inflammation throughout the upper abdomen. No organized or rim enhancing fluid collection at this time. No vascular occlusion or other complicating features. 3. Infrarenal abdominal aortic aneurysm measuring 31 mm. Recommend follow-up Ultrasound every 3 years. This recommendation follows ACR consensus guidelines: White Paper of the ACR Incidental Findings Committee II on Vascular Findings. J Am Coll Radiol 2013; 10:258-527. 4. Aortic Atherosclerosis (ICD10-I70.0) and Emphysema (ICD10-J43.9). 2/26: CT Head>>No acute intracranial abnormality. Normal examination. 2/26: US Abdomen (RUQ)>>1. Trace volume of ascites adjacent to the liver. 2. Otherwise, unremarkable examination. Specifically, no gallstones, signs of acute cholecystitis, or evidence of biliary tract Dilatation. 2/28: Echocardiogram>>1. Left ventricular ejection fraction, by estimation, is 65 to 70%. The  left ventricle has normal function. The left ventricle has no regional  wall motion abnormalities. Left ventricular diastolic parameters are  consistent with Grade I diastolic  dysfunction (impaired relaxation).  2. Right ventricular systolic function is normal. The right ventricular  size is normal. Tricuspid regurgitation signal is inadequate for assessing  PA pressure.  3. The mitral valve is normal in structure. No evidence of mitral valve  regurgitation. No evidence of mitral stenosis.  4. The aortic valve is normal in structure. Aortic valve regurgitation is  not visualized. Mild aortic valve stenosis. Aortic valve area, by VTI  measures 2.07 cm. Aortic valve mean gradient measures 6.0 mmHg.  5. The inferior vena cava is normal in size with greater than 50%  respiratory variability, suggesting right atrial pressure of 3 mmHg 2/28:CXR>>Normal heart size, mediastinal contours, and pulmonary vascularity. LEFT pleural  effusion new since previous exam with LEFT basilar atelectasis.Subsegmental atelectasis at RIGHT base as well. Upper lungs  clear. No pneumothorax. Bones demineralized.     Antimicrobials:  2/26: SARS-CoV-2 PCR>> negative 2/26: Influenza A&B PCR>> negative 2/26: Blood culture x2>> no growth to date 2/26: MRSA PCR>> negative  Cefepime 2/26>> 2/27 Flagyl 2/26>> 2/27 Zosyn 2/27>>  Subjective: Has mild epigastric discomfort , overall improving.  Not much p.o. intake.  Objective: Vitals:   03/04/20 1400 03/04/20 1410 03/04/20 1500 03/04/20 1600  BP: 132/64  (!) 116/52 (!) 137/59  Pulse: 99 (!) 105 (!) 104 (!) 124  Resp: (!) 26 (!) 27 (!) 23 (!) 26  Temp:      TempSrc:      SpO2: 92% 95% 92% 94%  Weight:      Height:        Intake/Output Summary (Last 24 hours) at 03/04/2020 1734 Last data filed at 03/04/2020 1612 Gross per 24 hour  Intake 339.49 ml  Output 700 ml  Net -360.51 ml   Filed Weights   02/29/20 0059 02/29/20 0941 03/01/20 0500  Weight: 67.1 kg 70.6 kg 70.9 kg    Examination:  General exam: Appears calm and comfortable  Respiratory system: Clear to auscultation. Respiratory effort normal. Cardiovascular system: S1 & S2 heard, RRR. No JVD, murmurs, rubs, gallops or clicks.  Gastrointestinal system: mildly distended, mild ttp epigastric area. Normal bowel sounds heard. Central nervous system: Alert and oriented.  Grossly intact Extremities: No edema Skin: Warm dry Psychiatry: Judgement and insight appear normal. Mood & affect appropriate.     Data Reviewed: I have personally reviewed following labs and imaging studies  CBC: Recent Labs  Lab 02/29/20 0110 02/29/20 1551 03/01/20 0628 03/02/20 0625 03/03/20 0331 03/04/20 0525  WBC 28.5* 16.0* 16.5* 14.7* 10.5 12.8*  NEUTROABS 22.3*  --  13.3*  --  8.3* 9.0*  HGB 17.5* 17.0* 15.0 13.5 11.9* 11.6*  HCT 51.9* 50.2* 45.1 41.3 36.6 35.1*  MCV 94.7 94.7 94.0 95.4 96.8 96.2  PLT 273 184 141* 124* 103*  131*   Basic Metabolic Panel: Recent Labs  Lab 02/29/20 1551 02/29/20 1750 02/29/20 2148 03/01/20 0628 03/02/20 0625 03/03/20 0331 03/03/20 2117 03/04/20 0525  NA 135  --  138 139 141 141  --  138  K 5.7*   < > 4.4 4.5 4.1 3.4*  --  3.4*  CL 109  --  108 109 111 112*  --  107  CO2 14*  --  18* 21* 23 23  --  25  GLUCOSE 232*  --  213* 218* 221* 267*  --  126*  BUN 16  --  18 20 24* 24*  --  18  CREATININE 1.61*  --  1.30* 1.31* 1.21* 1.09*  --  1.16*  CALCIUM 8.6*  --  8.1* 8.2* 8.3* 8.0*  --  8.1*  MG 2.0  --   --  2.0 2.0 1.9  --  2.0  PHOS  --   --   --  3.5 2.4* 1.5* 2.8 2.4*   < > = values in this interval not displayed.   GFR: Estimated Creatinine Clearance: 39.8 mL/min (A) (by C-G formula based on SCr of 1.16 mg/dL (H)). Liver Function Tests: Recent Labs  Lab 02/29/20 0110 03/01/20 0628 03/03/20 0331 03/04/20 0525  AST 25 44* 42* 26  ALT 16 14 17 16   ALKPHOS 51 39 37* 50  BILITOT 0.5 1.5* 2.0* 1.5*  PROT 6.7 5.3* 5.3* 5.7*  ALBUMIN 4.0 2.9* 2.5* 2.5*   Recent Labs  Lab 02/29/20 0110  LIPASE 5,191*  No results for input(s): AMMONIA in the last 168 hours. Coagulation Profile: No results for input(s): INR, PROTIME in the last 168 hours. Cardiac Enzymes: No results for input(s): CKTOTAL, CKMB, CKMBINDEX, TROPONINI in the last 168 hours. BNP (last 3 results) No results for input(s): PROBNP in the last 8760 hours. HbA1C: Recent Labs    03/03/20 0331  HGBA1C 5.8*   CBG: Recent Labs  Lab 03/03/20 1144 03/03/20 1617 03/03/20 2108 03/04/20 0807 03/04/20 1151  GLUCAP 292* 291* 142* 199* 150*   Lipid Profile: No results for input(s): CHOL, HDL, LDLCALC, TRIG, CHOLHDL, LDLDIRECT in the last 72 hours. Thyroid Function Tests: No results for input(s): TSH, T4TOTAL, FREET4, T3FREE, THYROIDAB in the last 72 hours. Anemia Panel: No results for input(s): VITAMINB12, FOLATE, FERRITIN, TIBC, IRON, RETICCTPCT in the last 72 hours. Sepsis Labs: Recent Labs   Lab 02/29/20 0455 02/29/20 0956 02/29/20 2148 02/29/20 2148 03/01/20 0628 03/02/20 0625 03/03/20 0331 03/04/20 0525  PROCALCITON  --   --  1.60   < > 1.55 2.03 1.78 1.32  LATICACIDVEN 5.5* 9.4* 3.1*  --  1.9  --   --   --    < > = values in this interval not displayed.    Recent Results (from the past 240 hour(s))  Resp Panel by RT-PCR (Flu A&B, Covid) Nasopharyngeal Swab     Status: None   Collection Time: 02/29/20  2:53 AM   Specimen: Nasopharyngeal Swab; Nasopharyngeal(NP) swabs in vial transport medium  Result Value Ref Range Status   SARS Coronavirus 2 by RT PCR NEGATIVE NEGATIVE Final    Comment: (NOTE) SARS-CoV-2 target nucleic acids are NOT DETECTED.  The SARS-CoV-2 RNA is generally detectable in upper respiratory specimens during the acute phase of infection. The lowest concentration of SARS-CoV-2 viral copies this assay can detect is 138 copies/mL. A negative result does not preclude SARS-Cov-2 infection and should not be used as the sole basis for treatment or other patient management decisions. A negative result may occur with  improper specimen collection/handling, submission of specimen other than nasopharyngeal swab, presence of viral mutation(s) within the areas targeted by this assay, and inadequate number of viral copies(<138 copies/mL). A negative result must be combined with clinical observations, patient history, and epidemiological information. The expected result is Negative.  Fact Sheet for Patients:  BloggerCourse.com  Fact Sheet for Healthcare Providers:  SeriousBroker.it  This test is no t yet approved or cleared by the [place] FDA and  has been authorized for detection and/or diagnosis of SARS-CoV-2 by FDA under an Emergency Use Authorization (EUA). This EUA will remain  in effect (meaning this test can be used) for the duration of the COVID-19 declaration under Section 564(b)(1) of the  Act, 21 U.S.C.section 360bbb-3(b)(1), unless the authorization is terminated  or revoked sooner.       Influenza A by PCR NEGATIVE NEGATIVE Final   Influenza B by PCR NEGATIVE NEGATIVE Final    Comment: (NOTE) The Xpert Xpress SARS-CoV-2/FLU/RSV plus assay is intended as an aid in the diagnosis of influenza from Nasopharyngeal swab specimens and should not be used as a sole basis for treatment. Nasal washings and aspirates are unacceptable for Xpert Xpress SARS-CoV-2/FLU/RSV testing.  Fact Sheet for Patients: BloggerCourse.com  Fact Sheet for Healthcare Providers: SeriousBroker.it  This test is not yet approved or cleared by the [place] FDA and has been authorized for detection and/or diagnosis of SARS-CoV-2 by FDA under an Emergency Use Authorization (EUA). This EUA will remain in effect (  meaning this test can be used) for the duration of the COVID-19 declaration under Section 564(b)(1) of the Act, 21 U.S.C. section 360bbb-3(b)(1), unless the authorization is terminated or revoked.  Performed at [facility], [address] [place], [place] [address]   Culture, blood (Routine X 2) w Reflex to ID Panel     Status: None (Preliminary result)   Collection Time: 02/29/20  3:26 AM   Specimen: BLOOD  Result Value Ref Range Status   Specimen Description BLOOD RIGHT ANTECUBITAL  Final   Special Requests   Final    BOTTLES DRAWN AEROBIC AND ANAEROBIC Blood Culture adequate volume   Culture   Final    NO GROWTH 4 DAYS Performed at [facility], [address] [place], [place] [address]    Report Status PENDING  Incomplete  Culture, blood (Routine X 2) w Reflex to ID Panel     Status: None (Preliminary result)   Collection Time: 02/29/20  3:43 AM   Specimen: BLOOD  Result Value Ref Range Status   Specimen Description BLOOD BLOOD RIGHT HAND  Final   Special Requests   Final    BOTTLES DRAWN AEROBIC AND  ANAEROBIC Blood Culture results may not be optimal due to an inadequate volume of blood received in culture bottles   Culture   Final    NO GROWTH 4 DAYS Performed at [facility], [address] [place], [place] [address]    Report Status PENDING  Incomplete  MRSA PCR Screening     Status: None   Collection Time: 02/29/20  9:45 AM   Specimen: Nasopharyngeal  Result Value Ref Range Status   MRSA by PCR NEGATIVE NEGATIVE Final    Comment:        The GeneXpert MRSA Assay (FDA approved for NASAL specimens only), is one component of a comprehensive MRSA colonization surveillance program. It is not intended to diagnose MRSA infection nor to guide or monitor treatment for MRSA infections. Performed at [facility], [address] [place], [place] [address]          Radiology Studies: EEG adult  Result Date: 03/23/20 Charlsie Quest, MD     23-Mar-2020  4:04 PM Patient Name: Holly Navarro MRN: [id number] Epilepsy Attending: Charlsie Quest Referring Physician/Provider: Dr Elyn Aquas Date: 2020/03/23 Duration: 21.32 mins Patient history: 75yo F with ams and seizure like activity. EEG to evaluate for seizure Level of alertness: Awake AEDs during EEG study: LEV Technical aspects: This EEG study was done with scalp electrodes positioned according to the 10-20 International system of electrode placement. Electrical activity was acquired at a sampling rate of 500Hz  and reviewed with a high frequency filter of 70Hz  and a low frequency filter of 1Hz . EEG data were recorded continuously and digitally stored. Description: The posterior dominant rhythm consists of 9 Hz activity of moderate voltage (25-35 uV) seen predominantly in posterior head regions, symmetric and reactive to eye opening and eye closing.EEG showed intermittent generalized 3 to 6 Hz theta-delta slowing. Intermittent generalized periodic discharges with triphasic morphology were noted at 1hz . Hyperventilation  and photic stimulation were not performed.   ABNORMALITY -Continuous slow, generalized - Periodic  Discharges with triphasic morphology, generalized IMPRESSION: This study is suggestive of mild diffuse encephalopathy, nonspecific etiology. Intermittent generalized periodic discharges with triphasic morphology were noted at 1hz  which can be on the ictal-interictal continuum but given the morphology and frequency, are more likely related to toxic-metabolic etiology. No seizures or definite epileptiform  discharges were seen throughout the recording. Priyanka Annabelle Harman        Scheduled Meds: . Chlorhexidine Gluconate Cloth  6 each Topical Q0600  . feeding supplement  1 Container Oral TID BM  . heparin injection (subcutaneous)  5,000 Units Subcutaneous Q8H  . insulin aspart  0-20 Units Subcutaneous TID WC  . insulin aspart  0-5 Units Subcutaneous QHS  . insulin detemir  10 Units Subcutaneous Daily  . multivitamin with minerals  1 tablet Oral Daily  . pantoprazole (PROTONIX) IV  40 mg Intravenous Q24H   Continuous Infusions: . piperacillin-tazobactam (ZOSYN)  IV 12.5 mL/hr at 03/04/20 1612    Assessment & Plan:   Active Problems:   Acute pancreatic necrosis   Seizure (HCC)   Acute Necrotizing Pancreatitis -No role for surgery or perc drain at this time, there is not a discrete collection that would benefit -There is no obvious intervention required at the level of the gallbladder or duct -Will maintain contact with [facility] for potential need of IR if come to that point -2/3-Monitor fever curve and trend WBC and procalcitonin  Procalcitonin is decreasing  Cultures to date negative we will follow up  Continue Zosyn  Lactic acid normalized  Ethanol level less than 10, triglyceride 169  Right upper quadrant abdominal ultrasound on 2/26 without gallstones, acute cholecystitis, or biliary tract dilatation  Encourage p.o. intake     Acute Hypoxic Respiratory Failure in the setting of  Atelectasis and Left Pleural Effusion -Supplemental O2 as needed to maintain O2 sats >92% -Follow intermittent CXR and ABG as needed -CXR on 2/28 with new LEFT pleural effusion with bibasilar atelectasis greater on LEFT 2/3-suspect pleural effusions due to pancreatitis Echo 02/23/2020 with LVEF 65 to 70%, grade 1 diastolic dysfunction Flutter valve and incentive spirometer    Acute Metabolic Encephalopathy>>resolved Generalized Tonic-Clonic Seizure Has first-time seizure in the ED.  This is provoked episodes in the setting of significant pancreatitis with lactic acidosis. EEG does not show any epileptiform discharges. CTA chest negative for any acute abnormalities Per neurology discontinue Keppra 500 mg twice daily No indication for AEDs at this time Neurology signed off     Acute Kidney Injury>>improving Creatinine 1.16 Continue to monitor levels Encourage p.o. intake Ensure adequate renal perfusion Avoid nephrotoxic agents    Hyperglycemia BG levels stable now Continue to monitor Continue RISS and levemir Ck A1c      DVT prophylaxis: Heparin subcu Code Status: DNR Family Communication: None at bedside  Status is: Inpatient  Remains inpatient appropriate because:Inpatient level of care appropriate due to severity of illness   Dispo: The patient is from: Home              Anticipated d/c is to: TBD              Patient currently is not medically stable to d/c.   Difficult to place patient No  Likely dc in >3 days            LOS: 4 days   Time spent: 45 minutes with more than 50% on COC    Lynn Ito, MD [facility] Pager [phone]  If 7PM-7AM, please contact night-coverage 03/04/2020, 5:34 PM

## 2020-03-04 NOTE — Telephone Encounter (Signed)
I attempted to contact the patient, no answer. LMOM to return my call. 

## 2020-03-04 NOTE — Plan of Care (Signed)
Pt continues to improve overall. Has been consistently oriented x 4, VS within goal ranges. Still requiring 3L O2, pt using more as blow by most of day. Reports adequate pain control with dilaudid, refusing to try norco tabs saying they don't work. Still refusing mobility despite education. Poor to fair appetite, eating approx 5-10% of meals. Son and daughter in law at bedside for most of day. Requested order to transfer out of ICU, awaiting response by MD.   Problem: Education: Goal: Knowledge of General Education information will improve Description: Including pain rating scale, medication(s)/side effects and non-pharmacologic comfort measures Outcome: Progressing   Problem: Health Behavior/Discharge Planning: Goal: Ability to manage health-related needs will improve Outcome: Progressing   Problem: Clinical Measurements: Goal: Ability to maintain clinical measurements within normal limits will improve Outcome: Progressing Goal: Will remain free from infection Outcome: Progressing Goal: Diagnostic test results will improve Outcome: Progressing Goal: Respiratory complications will improve Outcome: Progressing Goal: Cardiovascular complication will be avoided Outcome: Progressing   Problem: Activity: Goal: Risk for activity intolerance will decrease Outcome: Progressing   Problem: Nutrition: Goal: Adequate nutrition will be maintained Outcome: Progressing   Problem: Coping: Goal: Level of anxiety will decrease Outcome: Progressing   Problem: Elimination: Goal: Will not experience complications related to bowel motility Outcome: Progressing Goal: Will not experience complications related to urinary retention Outcome: Progressing   Problem: Pain Managment: Goal: General experience of comfort will improve Outcome: Progressing   Problem: Safety: Goal: Ability to remain free from injury will improve Outcome: Progressing   Problem: Skin Integrity: Goal: Risk for impaired  skin integrity will decrease Outcome: Progressing

## 2020-03-04 NOTE — Telephone Encounter (Signed)
I spoke with the patient son concerning his request. He verbalize understanding, no questions or concern.

## 2020-03-04 NOTE — Consult Note (Addendum)
PHARMACY CONSULT NOTE  Pharmacy Consult for Electrolyte Monitoring and Replacement   Recent Labs: Potassium (mmol/L)  Date Value  03/04/2020 3.4 (L)   Magnesium (mg/dL)  Date Value  71/06/2692 2.0   Calcium (mg/dL)  Date Value  85/46/2703 8.1 (L)   Albumin (g/dL)  Date Value  50/09/3816 2.5 (L)  10/22/2014 4.3   Phosphorus (mg/dL)  Date Value  29/93/7169 2.4 (L)   Sodium (mmol/L)  Date Value  03/04/2020 138  10/22/2014 140   Assessment: Patient is a 75 y/o F with medical history including HTN, CKD who was admitted 2/26 with severe acute pancreatitis. Pharmacy has been consulted to assist with electrolyte monitoring and replacement as indicated.   Labs on admission notable for lipase 5191, K 2.4, Scr 1.36, LA 5.5, WBC 28.5  Goal of Therapy:  Electrolytes within normal limits  Plan:  --Potassium 40 mEq PO --Diet advancing --Will follow-up all electrolytes with AM labs tomorrow  Pricilla Riffle, PharmD Clinical Pharmacist 03/04/2020 1:54 PM

## 2020-03-05 ENCOUNTER — Inpatient Hospital Stay: Payer: Medicare HMO

## 2020-03-05 DIAGNOSIS — R739 Hyperglycemia, unspecified: Secondary | ICD-10-CM | POA: Diagnosis not present

## 2020-03-05 DIAGNOSIS — K8591 Acute pancreatitis with uninfected necrosis, unspecified: Secondary | ICD-10-CM

## 2020-03-05 DIAGNOSIS — R509 Fever, unspecified: Secondary | ICD-10-CM

## 2020-03-05 DIAGNOSIS — R569 Unspecified convulsions: Secondary | ICD-10-CM | POA: Diagnosis not present

## 2020-03-05 LAB — COMPREHENSIVE METABOLIC PANEL
ALT: 15 U/L (ref 0–44)
AST: 23 U/L (ref 15–41)
Albumin: 2.2 g/dL — ABNORMAL LOW (ref 3.5–5.0)
Alkaline Phosphatase: 68 U/L (ref 38–126)
Anion gap: 7 (ref 5–15)
BUN: 15 mg/dL (ref 8–23)
CO2: 24 mmol/L (ref 22–32)
Calcium: 7.9 mg/dL — ABNORMAL LOW (ref 8.9–10.3)
Chloride: 104 mmol/L (ref 98–111)
Creatinine, Ser: 1.15 mg/dL — ABNORMAL HIGH (ref 0.44–1.00)
GFR, Estimated: 50 mL/min — ABNORMAL LOW (ref >60)
Glucose, Bld: 139 mg/dL — ABNORMAL HIGH (ref 70–99)
Potassium: 3.6 mmol/L (ref 3.5–5.1)
Sodium: 135 mmol/L (ref 135–145)
Total Bilirubin: 1.5 mg/dL — ABNORMAL HIGH (ref 0.3–1.2)
Total Protein: 5 g/dL — ABNORMAL LOW (ref 6.5–8.1)

## 2020-03-05 LAB — CBC WITH DIFFERENTIAL/PLATELET
Abs Immature Granulocytes: 0.46 10*3/uL — ABNORMAL HIGH (ref 0.00–0.07)
Basophils Absolute: 0.1 10*3/uL (ref 0.0–0.1)
Basophils Relative: 1 %
Eosinophils Absolute: 0.1 10*3/uL (ref 0.0–0.5)
Eosinophils Relative: 1 %
HCT: 31.9 % — ABNORMAL LOW (ref 36.0–46.0)
Hemoglobin: 10.2 g/dL — ABNORMAL LOW (ref 12.0–15.0)
Immature Granulocytes: 4 %
Lymphocytes Relative: 14 %
Lymphs Abs: 1.5 10*3/uL (ref 0.7–4.0)
MCH: 31.2 pg (ref 26.0–34.0)
MCHC: 32 g/dL (ref 30.0–36.0)
MCV: 97.6 fL (ref 80.0–100.0)
Monocytes Absolute: 1.8 10*3/uL — ABNORMAL HIGH (ref 0.1–1.0)
Monocytes Relative: 16 %
Neutro Abs: 7.1 10*3/uL (ref 1.7–7.7)
Neutrophils Relative %: 64 %
Platelets: 148 10*3/uL — ABNORMAL LOW (ref 150–400)
RBC: 3.27 MIL/uL — ABNORMAL LOW (ref 3.87–5.11)
RDW: 13 % (ref 11.5–15.5)
Smear Review: NORMAL
WBC: 10.9 10*3/uL — ABNORMAL HIGH (ref 4.0–10.5)
nRBC: 0.3 % — ABNORMAL HIGH (ref 0.0–0.2)

## 2020-03-05 LAB — CULTURE, BLOOD (ROUTINE X 2)
Culture: NO GROWTH
Culture: NO GROWTH
Special Requests: ADEQUATE

## 2020-03-05 LAB — URINALYSIS, COMPLETE (UACMP) WITH MICROSCOPIC
Bacteria, UA: NONE SEEN
Bilirubin Urine: NEGATIVE
Glucose, UA: NEGATIVE mg/dL
Ketones, ur: NEGATIVE mg/dL
Leukocytes,Ua: NEGATIVE
Nitrite: NEGATIVE
Protein, ur: 100 mg/dL — AB
Specific Gravity, Urine: 1.018 (ref 1.005–1.030)
pH: 6 (ref 5.0–8.0)

## 2020-03-05 LAB — GLUCOSE, CAPILLARY
Glucose-Capillary: 129 mg/dL — ABNORMAL HIGH (ref 70–99)
Glucose-Capillary: 150 mg/dL — ABNORMAL HIGH (ref 70–99)
Glucose-Capillary: 170 mg/dL — ABNORMAL HIGH (ref 70–99)
Glucose-Capillary: 78 mg/dL (ref 70–99)

## 2020-03-05 LAB — PHOSPHORUS: Phosphorus: 2.4 mg/dL — ABNORMAL LOW (ref 2.5–4.6)

## 2020-03-05 LAB — MAGNESIUM: Magnesium: 2.1 mg/dL (ref 1.7–2.4)

## 2020-03-05 MED ORDER — FLUTICASONE PROPIONATE 50 MCG/ACT NA SUSP
2.0000 | Freq: Every day | NASAL | Status: DC
  Administered 2020-03-05 – 2020-03-14 (×10): 2 via NASAL
  Filled 2020-03-05 (×2): qty 16

## 2020-03-05 MED ORDER — ACETAMINOPHEN 325 MG PO TABS
650.0000 mg | ORAL_TABLET | Freq: Once | ORAL | Status: AC
  Administered 2020-03-05: 325 mg via ORAL
  Filled 2020-03-05: qty 2

## 2020-03-05 MED ORDER — SIMETHICONE 80 MG PO CHEW
80.0000 mg | CHEWABLE_TABLET | Freq: Once | ORAL | Status: AC
  Administered 2020-03-05: 80 mg via ORAL
  Filled 2020-03-05 (×2): qty 1

## 2020-03-05 NOTE — Consult Note (Signed)
PHARMACY CONSULT NOTE  Pharmacy Consult for Electrolyte Monitoring and Replacement   Recent Labs: Potassium (mmol/L)  Date Value  03/05/2020 3.6   Magnesium (mg/dL)  Date Value  33/43/5686 2.1   Calcium (mg/dL)  Date Value  16/83/7290 7.9 (L)   Albumin (g/dL)  Date Value  21/11/5518 2.2 (L)  10/22/2014 4.3   Phosphorus (mg/dL)  Date Value  80/22/3361 2.4 (L)   Sodium (mmol/L)  Date Value  03/05/2020 135  10/22/2014 140   Assessment: Patient is a 75 y/o F with medical history including HTN, CKD who was admitted 2/26 with severe acute pancreatitis. Pharmacy has been consulted to assist with electrolyte monitoring and replacement as indicated.   Labs on admission notable for lipase 5191, K 2.4, Scr 1.36, LA 5.5, WBC 28.5  Goal of Therapy:  Electrolytes within normal limits  Plan:  --Labs WNL, phos just below normal limits. If continues to decline, will order replacement. Monitor for now. --Diet advancing --Will follow-up all electrolytes with AM labs tomorrow  Pricilla Riffle, PharmD Clinical Pharmacist 03/05/2020 2:54 PM

## 2020-03-05 NOTE — Progress Notes (Signed)
PROGRESS NOTE    JESLYN NICKLOW  ZOX:[id number] DOB: [birth year] DOA: 02/29/2020 PCP: Tarri Fuller, FNP    Brief Narrative:  [name] a 75 y.o.femalewith history of hypertension, hyperlipidemia, gastric ulcer [facility] severe sharp [facility]. The discomfort began Around 10 PM last nightand was associated with onset of nausea and cramps.She did vomit and became diaphoretic after emesis. The pain did radiate across her entire abdomen and to her back. There was no associated diarrhea or lose stool and no hematochezia or hematemesis. She was found to be hypothermic and hypertensive upon her arrival she was warmed and started on fluids. Her evaluation returned a lactic acid of 6. There was a leukocytosis in excess of 20. A CT revealed necrotizing pancreatitis with a corroborating lipase. The case was discussed with our local surgeon who felt she may be best served in transfer. The case was then d/w with [facility] Intens[facility] who explained there were no beds and without acute intervention planned, she would likely need to wait and be stabilized at [facility].  She was admitted to the ICU service on 2/26.  On 3/2 she was transferred to [facility] service. 3/3-febrile Tmax 100.2, GI consulted this am.  Consultants:   PCCM, neurology  Procedures:   2/26: CXR>>The lungs are symmetrically well expanded. Benign calcified granuloma within the right mid lung zone. Nodular density at the left lung base likely represents a confluence of vascular and osseous shadow. The lungs are otherwise clear. No pneumothorax or pleural effusion. Cardiac size within normal limits. No acute bone Abnormality. 2/26: KUB>>Normal abdominal gas pattern. Inferior pelvis excluded from view. No definite gross free intraperitoneal gas. No organomegaly. Vascular calcifications are seen within the abdominal aorta. Osseous structures are age-appropriate. 2/26: CT  Abdomen & Pelvis>>1. Severe Acute Pancreatitis, with subtotal associated Pancreatic Necrosis. 2. Moderate volume free fluid and inflammation throughout the upper abdomen. No organized or rim enhancing fluid collection at this time. No vascular occlusion or other complicating features. 3. Infrarenal abdominal aortic aneurysm measuring 31 mm. Recommend follow-up Ultrasound every 3 years. This recommendation follows ACR consensus guidelines: White Paper of the ACR Incidental Findings Committee II on Vascular Findings. J Am Coll Radiol 2013; 81:191-478. 4. Aortic Atherosclerosis (ICD10-I70.0) and Emphysema (ICD10-J43.9). 2/26: CT Head>>No acute intracranial abnormality. Normal examination. 2/26: US Abdomen (RUQ)>>1. Trace volume of ascites adjacent to the liver. 2. Otherwise, unremarkable examination. Specifically, no gallstones, signs of acute cholecystitis, or evidence of biliary tract Dilatation. 2/28: Echocardiogram>>1. Left ventricular ejection fraction, by estimation, is 65 to 70%. The  left ventricle has normal function. The left ventricle has no regional  wall motion abnormalities. Left ventricular diastolic parameters are  consistent with Grade I diastolic  dysfunction (impaired relaxation).  2. Right ventricular systolic function is normal. The right ventricular  size is normal. Tricuspid regurgitation signal is inadequate for assessing  PA pressure.  3. The mitral valve is normal in structure. No evidence of mitral valve  regurgitation. No evidence of mitral stenosis.  4. The aortic valve is normal in structure. Aortic valve regurgitation is  not visualized. Mild aortic valve stenosis. Aortic valve area, by VTI  measures 2.07 cm. Aortic valve mean gradient measures 6.0 mmHg.  5. The inferior vena cava is normal in size with greater than 50%  respiratory variability, suggesting right atrial pressure of 3 mmHg 2/28:CXR>>Normal heart size, mediastinal contours, and pulmonary  vascularity. LEFT pleural effusion new since previous exam with LEFT basilar atelectasis.Subsegmental atelectasis at  RIGHT base as well. Upper lungs clear. No pneumothorax. Bones demineralized.     Antimicrobials:  2/26: SARS-CoV-2 PCR>> negative 2/26: Influenza A&B PCR>> negative 2/26: Blood culture x2>> no growth to date 2/26: MRSA PCR>> negative  Cefepime 2/26>> 2/27 Flagyl 2/26>> 2/27 Zosyn 2/27>>  Subjective: C/o LLQ pain, constipation, decrease po intake. No n/v  Objective: Vitals:   03/05/20 0500 03/05/20 0600 03/05/20 0700 03/05/20 0730  BP:  (!) 113/52 (!) 112/48   Pulse: 84 91 92 91  Resp: (!) 21 (!) 24 (!) 22 (!) 24  Temp:    99.1 F (37.3 C)  TempSrc:    Oral  SpO2: 95% 93% (!) 89% 95%  Weight:      Height:        Intake/Output Summary (Last 24 hours) at 03/05/2020 0815 Last data filed at 03/05/2020 0519 Gross per 24 hour  Intake 218.3 ml  Output 450 ml  Net -231.7 ml   Filed Weights   02/29/20 0941 03/01/20 0500 03/05/20 0415  Weight: 70.6 kg 70.9 kg 74.3 kg    Examination: NAD, calm comfortable son at bedside Decrease bs at bases Reg, s1/s2 no gallop ttp mildly at epigastric, and llq, +bs, distended mildly Trace edema b/l Awake alert Mood and affect appropriate in current setting    Data Reviewed: I have personally reviewed following labs and imaging studies  CBC: Recent Labs  Lab 02/29/20 0110 02/29/20 1551 03/01/20 0628 03/02/20 0625 03/03/20 0331 03/04/20 0525 03/05/20 0749  WBC 28.5*   < > 16.5* 14.7* 10.5 12.8* 10.9*  NEUTROABS 22.3*  --  13.3*  --  8.3* 9.0* PENDING  HGB 17.5*   < > 15.0 13.5 11.9* 11.6* 10.2*  HCT 51.9*   < > 45.1 41.3 36.6 35.1* 31.9*  MCV 94.7   < > 94.0 95.4 96.8 96.2 97.6  PLT 273   < > 141* 124* 103* 131* 148*   < > = values in this interval not displayed.   Basic Metabolic Panel: Recent Labs  Lab 02/29/20 1551 02/29/20 1750 02/29/20 2148 03/01/20 0628 03/02/20 0625 03/03/20 0331  03/03/20 2117 03/04/20 0525  NA 135  --  138 139 141 141  --  138  K 5.7*   < > 4.4 4.5 4.1 3.4*  --  3.4*  CL 109  --  108 109 111 112*  --  107  CO2 14*  --  18* 21* 23 23  --  25  GLUCOSE 232*  --  213* 218* 221* 267*  --  126*  BUN 16  --  18 20 24* 24*  --  18  CREATININE 1.61*  --  1.30* 1.31* 1.21* 1.09*  --  1.16*  CALCIUM 8.6*  --  8.1* 8.2* 8.3* 8.0*  --  8.1*  MG 2.0  --   --  2.0 2.0 1.9  --  2.0  PHOS  --   --   --  3.5 2.4* 1.5* 2.8 2.4*   < > = values in this interval not displayed.   GFR: Estimated Creatinine Clearance: 43.9 mL/min (A) (by C-G formula based on SCr of 1.16 mg/dL (H)). Liver Function Tests: Recent Labs  Lab 02/29/20 0110 03/01/20 0628 03/03/20 0331 03/04/20 0525  AST 25 44* 42* 26  ALT 16 14 17 16   ALKPHOS 51 39 37* 50  BILITOT 0.5 1.5* 2.0* 1.5*  PROT 6.7 5.3* 5.3* 5.7*  ALBUMIN 4.0 2.9* 2.5* 2.5*   Recent Labs  Lab 02/29/20 0110  LIPASE 5,191*   No results for input(s): AMMONIA in the last 168 hours. Coagulation Profile: No results for input(s): INR, PROTIME in the last 168 hours. Cardiac Enzymes: No results for input(s): CKTOTAL, CKMB, CKMBINDEX, TROPONINI in the last 168 hours. BNP (last 3 results) No results for input(s): PROBNP in the last 8760 hours. HbA1C: Recent Labs    03/03/20 0331  HGBA1C 5.8*   CBG: Recent Labs  Lab 03/04/20 0807 03/04/20 1151 03/04/20 1759 03/04/20 2159 03/05/20 0804  GLUCAP 199* 150* 121* 114* 129*   Lipid Profile: No results for input(s): CHOL, HDL, LDLCALC, TRIG, CHOLHDL, LDLDIRECT in the last 72 hours. Thyroid Function Tests: No results for input(s): TSH, T4TOTAL, FREET4, T3FREE, THYROIDAB in the last 72 hours. Anemia Panel: No results for input(s): VITAMINB12, FOLATE, FERRITIN, TIBC, IRON, RETICCTPCT in the last 72 hours. Sepsis Labs: Recent Labs  Lab 02/29/20 0455 02/29/20 0956 02/29/20 2148 02/29/20 2148 03/01/20 0628 03/02/20 0625 03/03/20 0331 03/04/20 0525  PROCALCITON   --   --  1.60   < > 1.55 2.03 1.78 1.32  LATICACIDVEN 5.5* 9.4* 3.1*  --  1.9  --   --   --    < > = values in this interval not displayed.    Recent Results (from the past 240 hour(s))  Resp Panel by RT-PCR (Flu A&B, Covid) Nasopharyngeal Swab     Status: None   Collection Time: 02/29/20  2:53 AM   Specimen: Nasopharyngeal Swab; Nasopharyngeal(NP) swabs in vial transport medium  Result Value Ref Range Status   SARS Coronavirus 2 by RT PCR NEGATIVE NEGATIVE Final    Comment: (NOTE) SARS-CoV-2 target nucleic acids are NOT DETECTED.  The SARS-CoV-2 RNA is generally detectable in upper respiratory specimens during the acute phase of infection. The lowest concentration of SARS-CoV-2 viral copies this assay can detect is 138 copies/mL. A negative result does not preclude SARS-Cov-2 infection and should not be used as the sole basis for treatment or other patient management decisions. A negative result may occur with  improper specimen collection/handling, submission of specimen other than nasopharyngeal swab, presence of viral mutation(s) within the areas targeted by this assay, and inadequate number of viral copies(<138 copies/mL). A negative result must be combined with clinical observations, patient history, and epidemiological information. The expected result is Negative.  Fact Sheet for Patients:  BloggerCourse.com  Fact Sheet for Healthcare Providers:  SeriousBroker.it  This test is no t yet approved or cleared by the [place] FDA and  has been authorized for detection and/or diagnosis of SARS-CoV-2 by FDA under an Emergency Use Authorization (EUA). This EUA will remain  in effect (meaning this test can be used) for the duration of the COVID-19 declaration under Section 564(b)(1) of the Act, 21 U.S.C.section 360bbb-3(b)(1), unless the authorization is terminated  or revoked sooner.       Influenza A by PCR NEGATIVE  NEGATIVE Final   Influenza B by PCR NEGATIVE NEGATIVE Final    Comment: (NOTE) The Xpert Xpress SARS-CoV-2/FLU/RSV plus assay is intended as an aid in the diagnosis of influenza from Nasopharyngeal swab specimens and should not be used as a sole basis for treatment. Nasal washings and aspirates are unacceptable for Xpert Xpress SARS-CoV-2/FLU/RSV testing.  Fact Sheet for Patients: BloggerCourse.com  Fact Sheet for Healthcare Providers: SeriousBroker.it  This test is not yet approved or cleared by the [place] FDA and has been authorized for detection and/or diagnosis of SARS-CoV-2 by FDA under an Emergency Use Authorization (EUA). This EUA  will remain in effect (meaning this test can be used) for the duration of the COVID-19 declaration under Section 564(b)(1) of the Act, 21 U.S.C. section 360bbb-3(b)(1), unless the authorization is terminated or revoked.  Performed at [facility], [address] [place], [place] [address]   Culture, blood (Routine X 2) w Reflex to ID Panel     Status: None   Collection Time: 02/29/20  3:26 AM   Specimen: BLOOD  Result Value Ref Range Status   Specimen Description BLOOD RIGHT ANTECUBITAL  Final   Special Requests   Final    BOTTLES DRAWN AEROBIC AND ANAEROBIC Blood Culture adequate volume   Culture   Final    NO GROWTH 5 DAYS Performed at [facility], [address] [place], [place] [address]    Report Status 03/05/2020 FINAL  Final  Culture, blood (Routine X 2) w Reflex to ID Panel     Status: None   Collection Time: 02/29/20  3:43 AM   Specimen: BLOOD  Result Value Ref Range Status   Specimen Description BLOOD BLOOD RIGHT HAND  Final   Special Requests   Final    BOTTLES DRAWN AEROBIC AND ANAEROBIC Blood Culture results may not be optimal due to an inadequate volume of blood received in culture bottles   Culture   Final    NO GROWTH 5 DAYS Performed at  [facility], [address] [place], [place] [address]    Report Status 03/05/2020 FINAL  Final  MRSA PCR Screening     Status: None   Collection Time: 02/29/20  9:45 AM   Specimen: Nasopharyngeal  Result Value Ref Range Status   MRSA by PCR NEGATIVE NEGATIVE Final    Comment:        The GeneXpert MRSA Assay (FDA approved for NASAL specimens only), is one component of a comprehensive MRSA colonization surveillance program. It is not intended to diagnose MRSA infection nor to guide or monitor treatment for MRSA infections. Performed at [facility], [address] [place], [place] [address]          Radiology Studies: EEG adult  Result Date: 2020-03-14 Charlsie Quest, MD     Mar 14, 2020  4:04 PM Patient Name: Holly Navarro MRN: [id number] Epilepsy Attending: Charlsie Quest Referring Physician/Provider: Dr Elyn Aquas Date: 03-14-20 Duration: 21.32 mins Patient history: 75yo F with ams and seizure like activity. EEG to evaluate for seizure Level of alertness: Awake AEDs during EEG study: LEV Technical aspects: This EEG study was done with scalp electrodes positioned according to the 10-20 International system of electrode placement. Electrical activity was acquired at a sampling rate of 500Hz  and reviewed with a high frequency filter of 70Hz  and a low frequency filter of 1Hz . EEG data were recorded continuously and digitally stored. Description: The posterior dominant rhythm consists of 9 Hz activity of moderate voltage (25-35 uV) seen predominantly in posterior head regions, symmetric and reactive to eye opening and eye closing.EEG showed intermittent generalized 3 to 6 Hz theta-delta slowing. Intermittent generalized periodic discharges with triphasic morphology were noted at 1hz . Hyperventilation and photic stimulation were not performed.   ABNORMALITY -Continuous slow, generalized - Periodic  Discharges with triphasic morphology, generalized IMPRESSION: This  study is suggestive of mild diffuse encephalopathy, nonspecific etiology. Intermittent generalized periodic discharges with triphasic morphology were noted at 1hz  which can be on the ictal-interictal continuum but given the morphology and frequency, are more likely related to toxic-metabolic etiology. No seizures or  definite epileptiform discharges were seen throughout the recording. Priyanka Annabelle Harman        Scheduled Meds: . Chlorhexidine Gluconate Cloth  6 each Topical Q0600  . feeding supplement  1 Container Oral TID BM  . fluticasone  2 spray Each Nare Daily  . heparin injection (subcutaneous)  5,000 Units Subcutaneous Q8H  . insulin aspart  0-20 Units Subcutaneous TID WC  . insulin aspart  0-5 Units Subcutaneous QHS  . insulin detemir  10 Units Subcutaneous Daily  . multivitamin with minerals  1 tablet Oral Daily  . pantoprazole (PROTONIX) IV  40 mg Intravenous Q24H   Continuous Infusions: . piperacillin-tazobactam (ZOSYN)  IV 3.375 g (03/05/20 0518)    Assessment & Plan:   Active Problems:   Acute pancreatic necrosis   Seizure (HCC)   Acute Necrotizing Pancreatitis -No role for surgery or perc drain at this time, there is not a discrete collection that would benefit -There is no obvious intervention required at the level of the gallbladder or duct -Will maintain contact with [facility] for potential need of IR if come to that point -2/3-Monitor fever curve and trend WBC and procalcitonin  Procalcitonin is decreasing  Lactic acid normalized  Ethanol level less than 10, triglyceride 169  Right upper quadrant abdominal ultrasound on 2/26 without gallstones, acute cholecystitis, or biliary tract dilatation  3/3-GI consulted since pt febrile and has pain GIs input was appreciated-clear and apparently is improving.  If patient's symptoms increase or patient starts to date.  Recommend repeating CT scan of the abdomen but for now hold off on CT scan. Continue IV Zosyn Abdominal  x-ray with gas no obstruction-we will add simethicone   Fever- possibly atelectasis? Doubt pna. Continue zosyn for now Wbc down procal down, will recheck  Ck blood cx, ua (negative), ucx   Acute Hypoxic Respiratory Failure in the setting of Atelectasis and Left Pleural Effusion -Supplemental O2 as needed to maintain O2 sats >92% -Follow intermittent CXR and ABG as needed -CXR on 2/28 with new LEFT pleural effusion with bibasilar atelectasis greater on LEFT 3/2-pleural effusion due to pancreatitis  Echo 02/23/2020 with LVEF 65 to 70%, grade 1 diastolic dysfunction  Flutter valve and incentive spirometer  Wean down 02 as tolerated keeping 02 sat >92%   Acute Metabolic Encephalopathy>>resolved Generalized Tonic-Clonic Seizure Has first-time seizure in the ED.  This is provoked episodes in the setting of significant pancreatitis with lactic acidosis. 3/3-EEG -received a mild diffuse encephalopathy, nonspecific etiology.  Possibly toxic metabolic etiology.  No seizure or definite epileptiform discharges were seen  Per neurology discontinue Keppra No indication for AED at this time Neurology signed      Acute Kidney Injury>>improving Creatinine 1.15 Continue to monitor levels Encourage p.o. intake Ensure adequate renal perfusion Avoid nephrotoxic agents    Hyperglycemia BG levels stable now Continue to monitor Continue RISS and levemir Ck A1c      DVT prophylaxis: Heparin subcu Code Status: DNR Family Communication: son at bedside  Status is: Inpatient  Remains inpatient appropriate because:Inpatient level of care appropriate due to severity of illness   Dispo: The patient is from: Home              Anticipated d/c is to: TBD              Patient currently is not medically stable to d/c.   Difficult to place patient No  Likely dc in >3 days needs snf. Decrease po intake. Today with fever  LOS: 5 days   Time spent: 45 minutes with  more than 50% on COC    Lynn Ito, MD [facility] Pager [phone]  If 7PM-7AM, please contact night-coverage 03/05/2020, 8:15 AM

## 2020-03-05 NOTE — Progress Notes (Signed)
Telephone report called to Raman. Patient taken to room 222 via wheelchair by nurse tech.

## 2020-03-05 NOTE — Progress Notes (Signed)
Dr. Belia Heman notified of hgb of 6.6

## 2020-03-05 NOTE — Consult Note (Signed)
Holly Minium, MD [facility]  [address] [place], [place] [address] Phone: [phone] Fax : [phone]  Consultation  Referring Provider:     Dr. Marylu Lund Primary Care Physician:  Tarri Fuller, FNP Primary Gastroenterologist: Gentry Fitz         Reason for Consultation:    Necrotizing pancreatitis  Date of Admission:  02/29/2020 Date of Consultation:  03/05/2020         HPI:   Holly Navarro a 75 y.o. female who came to the emergency room back on February 26 with abdominal pain and was diagnosed with necrotizing pancreatitis attempts were made to transfer the patient to a tertiary care center after seen by surgery and the recommendation was to transfer.  The patient denies that she Navarro ever had pancreatitis before.  She states that she drinks approximately 4 glasses of wine per week without any history of any excessive alcohol use.  She also denies any gallbladder problems and has not had her gallbladder out. The patient has taken considerably less pain medication at this time has gone on.  She Navarro also eating well without any nausea or vomiting.  She states that she does have some residual pain but much better than it was when she first came in.  The patient has had an elevated temperature and was started on antibiotics.  The patient's liver enzymes since admission have shown:  Component     Latest Ref Rng & Units 02/29/2020 02/29/2020 02/29/2020 02/29/2020         1:10 AM  9:56 AM  3:51 PM  5:50 PM  AST     15 - 41 U/L 25     ALT     0 - 44 U/L 16     Alkaline Phosphatase     38 - 126 U/L 51     Total Bilirubin     0.3 - 1.2 mg/dL 0.5      Component     Latest Ref Rng & Units 02/29/2020 03/01/2020 03/02/2020 03/03/2020         9:48 PM     AST     15 - 41 U/L  44 (H)  42 (H)  ALT     0 - 44 U/L  14  17  Alkaline Phosphatase     38 - 126 U/L  39  37 (L)  Total Bilirubin     0.3 - 1.2 mg/dL  1.5 (H)  2.0 (H)   Component     Latest Ref Rng & Units 03/04/2020 03/05/2020            AST     15 - 41 U/L 26 23  ALT     0 - 44 U/L 16 15  Alkaline Phosphatase     38 - 126 U/L 50 68  Total Bilirubin     0.3 - 1.2 mg/dL 1.5 (H) 1.5 (H)   The patient's white cell count was as high as 28 5 days ago and Navarro down to 10.9 this morning.  Past Medical History:  Diagnosis Date  . Anxiety   . Arthritis   . Cardiac arrhythmia   . Hyperlipidemia   . Stomach ulcer     Past Surgical History:  Procedure Laterality Date  . CYSTECTOMY      Prior to Admission medications   Medication Sig Start Date End Date Taking? Authorizing Provider  atenolol (TENORMIN) 50 MG tablet Take 1 tablet (50 mg total) by  mouth daily. 12/11/19  Yes Malfi, Jodelle Gross, FNP  cimetidine (TAGAMET) 200 MG tablet Take 200 mg by mouth 2 (two) times daily.   Yes [provider]  co-enzyme Q-10 30 MG capsule Take 30 mg by mouth 3 (three) times daily.   Yes [provider]  cyclobenzaprine (FLEXERIL) 10 MG tablet Take 10 mg by mouth 3 (three) times daily as needed for muscle spasms.   Yes [provider]  Dextromethorphan-guaiFENesin (CORICIDIN HBP CONGESTION/COUGH) 10-200 MG CAPS Take 2 capsules by mouth daily as needed.   Yes [provider]  lisinopril (ZESTRIL) 10 MG tablet Take 1 tablet (10 mg total) by mouth daily. 12/11/19  Yes Malfi, Jodelle Gross, FNP  omega-3 acid ethyl esters (LOVAZA) 1 G capsule Take by mouth 2 (two) times daily.   Yes [provider]  OVER THE COUNTER MEDICATION Take 3 capsules by mouth daily. VISICLEAR - [facility] Formula   Yes [provider]  Phenylephrine-DM-GG (ROBITUSSIN MULTI-SYMPTOM MAX PO) Take 10 mLs by mouth every 4 (four) hours as needed.   Yes [provider]  rosuvastatin (CRESTOR) 10 MG tablet TAKE 1 TABLET BY MOUTH FIVE DAYS PER WEEK 12/11/19  Yes Malfi, Jodelle Gross, FNP  diphenhydrAMINE (BENADRYL) 25 mg capsule Take 25 mg by mouth Nightly.  Patient not taking: Reported on 02/29/2020    [provider]  hydrocortisone 2.5 % cream Apply topically. Patient not taking: Reported on 02/29/2020 08/26/19   [provider]  milk thistle 175 MG tablet Take 175 mg by mouth daily. Patient not taking: Reported on 02/29/2020    [provider]  Specialty Vitamins Products (ECHINACEA C COMPLETE PO) Take by mouth. Patient not taking: Reported on 02/29/2020    [provider]    Family History  Problem Relation Age of Onset  . Heart disease Mother   . Kidney disease Mother   . Diabetes Father   . Cancer Brother        pencreatic     Social History   Tobacco Use  . Smoking status: Current Every Day Smoker    Packs/day: 0.50    Types: Cigarettes  . Smokeless tobacco: Never Used  Vaping Use  . Vaping Use: Never used  Substance Use Topics  . Alcohol use: Yes    Alcohol/week: 0.0 standard drinks    Comment: occasionally   . Drug use: No    Allergies as of 02/29/2020  . (No Known Allergies)    Review of Systems:    All systems reviewed and negative except where noted in HPI.   Physical Exam:  Vital signs in last 24 hours: Temp:  [97.8 F (36.6 C)-100.2 F (37.9 C)] 99.1 F (37.3 C) (03/03 0730) Pulse Rate:  [81-124] 93 (03/03 0800) Resp:  [17-29] 22 (03/03 0800) BP: (105-137)/(48-94) 111/94 (03/03 0800) SpO2:  [88 %-100 %] 99 % (03/03 0800) Weight:  [74.3 kg] 74.3 kg (03/03 0415) Last BM Date:  (PTA) General:   Pleasant, cooperative in NAD Head:  Normocephalic and atraumatic. Eyes:   No icterus.   Conjunctiva pink. PERRLA. Ears:  Normal auditory acuity. Neck:  Supple; no masses or thyroidomegaly Lungs: Respirations even and unlabored. Lungs clear to auscultation bilaterally.   No wheezes, crackles, or rhonchi.  Heart:  Regular rate and rhythm;  Without murmur, clicks, rubs or gallops Abdomen:  Soft, nondistended, nontender. Normal bowel sounds. No appreciable masses or hepatomegaly.  No rebound or guarding.  Rectal:  Not performed. Msk:  Symmetrical without gross deformities.    Extremities:  Without edema, cyanosis or clubbing. Neurologic:  Alert and oriented x3;  grossly normal neurologically. Skin:  Intact without significant lesions or rashes. Cervical Nodes:  No significant cervical adenopathy. Psych:  Alert and cooperative. Normal affect.  LAB RESULTS: Recent Labs    03/03/20 0331 03/04/20 0525 03/05/20 0749  WBC 10.5 12.8* 10.9*  HGB 11.9* 11.6* 10.2*  HCT 36.6 35.1* 31.9*  PLT 103* 131* 148*   BMET Recent Labs    03/03/20 0331 03/04/20 0525 03/05/20 0749  NA 141 138 135  K 3.4* 3.4* 3.6  CL 112* 107 104  CO2 23 25 24   GLUCOSE 267* 126* 139*  BUN 24* 18 15  CREATININE 1.09* 1.16* 1.15*  CALCIUM 8.0* 8.1* 7.9*   LFT Recent Labs    03/05/20 0749  PROT 5.0*  ALBUMIN 2.2*  AST 23  ALT 15  ALKPHOS 68  BILITOT 1.5*   PT/INR No results for input(s): LABPROT, INR in the last 72 hours.  STUDIES: DG Chest Port 1 View  Result Date: 03/05/2020 CLINICAL DATA:  Fever. EXAM: PORTABLE CHEST 1 VIEW COMPARISON:  Chest x-ray 02/29/2020. FINDINGS: Mediastinum and hilar structures normal. Heart size normal. Low lung volumes. Bibasilar pulmonary infiltrates/edema and bilateral pleural effusions. Mild thoracic spine scoliosis. No acute bony abnormality. IMPRESSION: Low lung volumes. Bibasilar pulmonary infiltrates/edema and bilateral pleural effusions. Bibasilar pneumonia could present in this fashion. Electronically Signed   By: Maisie Fushomas  Register   On: 03/05/2020 09:53   EEG adult  Result Date: 03/03/2020 Charlsie QuestYadav, Priyanka O, MD     03/03/2020  4:04 PM Patient Name: Jacqlyn LarsenFelicia A Grimme MRN: [id number] Epilepsy Attending: Charlsie QuestPriyanka O Yadav Referring Physician/Provider: Dr Elyn Aquasonald Brescia Date: 03/03/2020 Duration: 21.32 mins Patient history: 75yo F with ams and seizure like activity. EEG to evaluate for seizure Level of alertness: Awake AEDs during EEG study: LEV Technical aspects: This EEG study was done with scalp electrodes  positioned according to the 10-20 International system of electrode placement. Electrical activity was acquired at a sampling rate of 500Hz  and reviewed with a high frequency filter of 70Hz  and a low frequency filter of 1Hz . EEG data were recorded continuously and digitally stored. Description: The posterior dominant rhythm consists of 9 Hz activity of moderate voltage (25-35 uV) seen predominantly in posterior head regions, symmetric and reactive to eye opening and eye closing.EEG showed intermittent generalized 3 to 6 Hz theta-delta slowing. Intermittent generalized periodic discharges with triphasic morphology were noted at 1hz . Hyperventilation and photic stimulation were not performed.   ABNORMALITY -Continuous slow, generalized - Periodic  Discharges with triphasic morphology, generalized IMPRESSION: This study Navarro suggestive of mild diffuse encephalopathy, nonspecific etiology. Intermittent generalized periodic discharges with triphasic morphology were noted at 1hz  which can be on the ictal-interictal continuum but given the morphology and frequency, are more likely related to toxic-metabolic etiology. No seizures or definite epileptiform discharges were seen throughout the recording. Priyanka Annabelle Harman Yadav      Impression / Plan:   Assessment: Active Problems:   Acute pancreatic necrosis   Seizure (HCC)   Tajah A Alycia RossettiRyan Navarro a 75 y.o. y/o female with necrotizing pancreatitis with stable labs.  The patient has been on antibiotics for a recent increased temperature.  The patient appears to be improving greatly since admission.  Plan:  Prophylactic antibiotics are not recommended in patients with acute pancreatitis, regardless of the type (interstitial or necrotizing) or disease severity (mild, moderately severe, or severe).  Agree with antibiotics  if a extrapancreatic source of infection Navarro suspected or proven.  The mortality of infected pancreatic necrosis Navarro as high as 75% and Navarro indicated with  deterioration of the patient progresses or there Navarro no improvement in 7 to 10 days after being admitted.  This does not apply to this patient.  If the patient's symptoms increase or the patient starts to deteriorate then a repeat CT scan of the abdomen would be recommended.  I would hold off on the CT scan of the abdomen with contrast at this time due to the slightly impaired renal function and the patient improving clinically.    Thank you for involving me in the care of this patient.      LOS: 5 days   Holly Minium, MD, [facility] 03/05/2020, 1:13 PM,  Pager [phone] 7am-5pm  Check AMION for 5pm -7am coverage and on weekends   Note: This dictation was prepared with Dragon dictation along with smaller phrase technology. Any transcriptional errors that result from this process are unintentional.

## 2020-03-05 NOTE — Evaluation (Signed)
Occupational Therapy Evaluation Patient Details Name: Holly Navarro MRN: [id number] DOB: 04/11/1945 Today's Date: 03/05/2020    History of Present Illness Holly Navarro is a 75 y.o. female with history of hypertension, hyperlipidemia, gastric ulcer who presented to [facility] emergency department for sudden onset diffuse severe sharp abdominal pain.   Clinical Impression   Holly Navarro was seen for OT evaluation this date. Prior to hospital admission, pt was Independent for mobility and ADLs. Pt lives alone with son available 24/7 if needed. Pt presents to acute OT demonstrating impaired ADL performance and functional mobility 2/2 decreased activity tolerance, functional strength/ROM/balance deficits, and poor insight into deficits. Upon arrival, pt reclined in bed, opens eyes minimally to greet OT and initially answers questions. Mobility attempts initiated and pt refuses stating "I wish you were better behaved." Pt able to return legs to bed with CGA.   Son in room at end of session to answer home setup/PLOF questions. Reports pt is more active in the morning. Pt would benefit from skilled OT to address noted impairments and functional limitations (see below for any additional details) in order to maximize safety and independence while minimizing falls risk and caregiver burden. Upon hospital discharge, recommend STR to maximize pt safety and return to PLOF.     Follow Up Recommendations  SNF    Equipment Recommendations  Other (comment) (TBD)    Recommendations for Other Services       Precautions / Restrictions Precautions Precautions: Fall Restrictions Weight Bearing Restrictions: No      Mobility Bed Mobility Overal bed mobility: Needs Assistance Bed Mobility: Supine to Sit     Supine to sit: Min assist     General bed mobility comments: achieved legs off of bed, then retuirned to supine    Transfers                 General transfer comment: refuses        ADL  either performed or assessed with clinical judgement   ADL Overall ADL's : Needs assistance/impaired                                       General ADL Comments: Moves all limbs against gravity, disgruntled and doesn't want to engage in ADLs                  Pertinent Vitals/Pain Pain Assessment: No/denies pain     Hand Dominance     Extremity/Trunk Assessment Upper Extremity Assessment Upper Extremity Assessment: Generalized weakness   Lower Extremity Assessment Lower Extremity Assessment: Generalized weakness       Communication Communication Communication: Other (comment) (lethargic)   Cognition Arousal/Alertness: Lethargic Behavior During Therapy: Agitated Overall Cognitive Status: Difficult to assess                                     General Comments       Exercises Exercises: Other exercises Other Exercises Other Exercises: Pt educated re: OT role, DME recs, d/c recs, falls prevention Other Exercises: LBD, sup>sit, self-drinking   Shoulder Instructions      Home Living Family/patient expects to be discharged to:: Private residence Living Arrangements: Alone Available Help at Discharge: Family;Available 24 hours/day Type of Home: House Home Access: Stairs to enter [employer] of Steps: 6 Entrance Stairs-Rails: Right;Left Home  Layout: One level     Bathroom Shower/Tub: Walk-in shower                    Prior Functioning/Environment Level of Independence: Independent                 OT Problem List: Decreased strength;Decreased activity tolerance;Decreased range of motion;Impaired balance (sitting and/or standing)      OT Treatment/Interventions: Self-care/ADL training;Therapeutic exercise;Energy conservation;DME and/or AE instruction;Therapeutic activities;Balance training;Patient/family education    OT Goals(Current goals can be found in the care plan section) Acute Rehab OT  Goals Patient Stated Goal: To return home independently OT Goal Formulation: With patient/family Time For Goal Achievement: 03/19/20 Potential to Achieve Goals: Fair ADL Goals Pt Will Perform Grooming: with set-up;with supervision;sitting Pt Will Perform Lower Body Dressing: with min assist;sitting/lateral leans Pt Will Transfer to Toilet: with min guard assist;stand pivot transfer;bedside commode (c LRAD PRN)  OT Frequency: Min 1X/week    AM-PAC OT "6 Clicks" Daily Activity     Outcome Measure Help from another person eating meals?: A Little Help from another person taking care of personal grooming?: A Little Help from another person toileting, which includes using toliet, bedpan, or urinal?: A Lot Help from another person bathing (including washing, rinsing, drying)?: A Lot Help from another person to put on and taking off regular upper body clothing?: A Little Help from another person to put on and taking off regular lower body clothing?: A Lot 6 Click Score: 15   End of Session    Activity Tolerance: Patient tolerated treatment well Patient left: in bed;with call bell/phone within reach;with family/visitor present  OT Visit Diagnosis: Other abnormalities of gait and mobility (R26.89);Muscle weakness (generalized) (M62.81)                Time: 7782-4235 OT Time Calculation (min): 21 min Charges:  OT General Charges $OT Visit: 1 Visit OT Evaluation $OT Eval Low Complexity: 1 Low OT Treatments $Self Care/Home Management : 8-22 mins  Kathie Dike, M.S. OTR/L  03/05/20, 4:45 PM  ascom [phone]

## 2020-03-05 NOTE — Progress Notes (Signed)
   03/05/20 0910  Clinical Encounter Type  Visited With Patient and family together  Visit Type Follow-up  Referral From Nurse  Consult/Referral To Chaplain  Spiritual Encounters  Spiritual Needs Literature;Emotional  Chaplain Jameela Michna responded to ICU room 18, [name]. This visit was a follow up for an AD. I spoke to Pt's son to see if he had completed the pamphlet and he stated, "no not yet it will be done in a hour or two." I advised the son to have the nurse page the on-call chaplain when they are ready and we will try to get that completed for them.

## 2020-03-05 NOTE — Progress Notes (Signed)
Messaged Dr. Marylu Lund about non-productive cough, rhonchi bilaterally, fever over night of 102, poor PO intake, feeling full after a few bites of meals, no BM since admission.  MD ordered cxr, urinalysis, urine cultures, blood cultures.

## 2020-03-05 NOTE — Progress Notes (Signed)
Pt continues to show improvement. HR NSR, O2 >96 when patient wears oxygen. Pt took nasal cannula off multiple times throughout the night. Pt reports no abdominal pain at this time. Pt received PRN dilaudid and norco last night. Pt febrile last night at 100.2, Dr notified and tylenol ordered and administered. Pt is currently afebrile.

## 2020-03-05 NOTE — Progress Notes (Signed)
Secured chat sent to Dr. Marylu Lund about oral fever of 100.5

## 2020-03-06 DIAGNOSIS — R569 Unspecified convulsions: Secondary | ICD-10-CM | POA: Diagnosis not present

## 2020-03-06 DIAGNOSIS — K8591 Acute pancreatitis with uninfected necrosis, unspecified: Secondary | ICD-10-CM | POA: Diagnosis not present

## 2020-03-06 DIAGNOSIS — N179 Acute kidney failure, unspecified: Secondary | ICD-10-CM | POA: Diagnosis not present

## 2020-03-06 LAB — URINE CULTURE: Culture: NO GROWTH

## 2020-03-06 LAB — MAGNESIUM: Magnesium: 2.3 mg/dL (ref 1.7–2.4)

## 2020-03-06 LAB — GLUCOSE, CAPILLARY
Glucose-Capillary: 98 mg/dL (ref 70–99)
Glucose-Capillary: 195 mg/dL — ABNORMAL HIGH (ref 70–99)
Glucose-Capillary: 119 mg/dL — ABNORMAL HIGH (ref 70–99)
Glucose-Capillary: 94 mg/dL (ref 70–99)

## 2020-03-06 LAB — PHOSPHORUS: Phosphorus: 2 mg/dL — ABNORMAL LOW (ref 2.5–4.6)

## 2020-03-06 MED ORDER — K PHOS MONO-SOD PHOS DI & MONO 155-852-130 MG PO TABS
500.0000 mg | ORAL_TABLET | ORAL | Status: AC
  Administered 2020-03-06 (×2): 500 mg via ORAL
  Filled 2020-03-06 (×2): qty 2

## 2020-03-06 NOTE — TOC Progression Note (Signed)
Transition of Care [facility]) - Progression Note    Patient Details  Name: LORECE KEACH MRN: [id number] Date of Birth: 1945-06-24  [facility]) CM/SW Contact  Caryn Section, RN Phone Number: 03/06/2020, 2:40 PM  Clinical Narrative:   TOC in to speak with patient, son Sharlot Gowda at bedside.  Introductions made, purpose of visit communicated as discharge planning.  TOC informed patient and son that Home Health was recommended, [facility] and [facility] were selected by patient and family, however they both declined due to not accepting [employer] Medicare at this time.  [facility] accepted patient with 5 day wait period.  Brittney at [facility] stated that they could provide services starting on Wed of next week.  Patient and family amenable. Patient has wheelchair and commode at home, [facility] will order walker, as specified by PT recommendations.  TOC left contact information with son for any questions or concerns.  TOC will follow through discharge.         Expected Discharge Plan and Services                                                 Social Determinants of Health (SDOH) Interventions    Readmission Risk Interventions No flowsheet data found.

## 2020-03-06 NOTE — Progress Notes (Signed)
PROGRESS NOTE    Holly Navarro  JSE:[id number] DOB: [birth year] DOA: 02/29/2020 PCP: Tarri Fuller, FNP    Brief Narrative:  Holly Navarro a 75 y.o.femalewith history of hypertension, hyperlipidemia, gastric ulcer [facility] diffuse severe sharp abdominal [facility]. The discomfort began Around 10 PM last nightand was associated with onset of nausea and cramps.She did vomit and became diaphoretic after emesis. The pain did radiate across her entire abdomen and to her back. There was no associated diarrhea or lose stool and no hematochezia or hematemesis. She was found to be hypothermic and hypertensive upon her arrival she was warmed and started on fluids. Her evaluation returned a lactic acid of 6. There was a leukocytosis in excess of 20. A CT revealed necrotizing pancreatitis with a corroborating lipase. The case was discussed with our local surgeon who felt she may be best served in transfer. The case was then d/w with [facility] Intens[facility] who explained there were no beds and without acute intervention planned, she would likely need to wait and be stabilized at [facility].  She was admitted to the ICU service on 2/26.  On 3/2 she was transferred to [facility] ([facility]) service. 3/3-febrile Tmax 100.2, GI consulted this am 3/4-spiked fever again tmax 100.5.  She was complaining of abdominal distention but improved with simethicone. Eating food today and tolerating   Consultants:   PCCM, neurology  Procedures:   2/26: CXR>>The lungs are symmetrically well expanded. Benign calcified granuloma within the right mid lung zone. Nodular density at the left lung base likely represents a confluence of vascular and osseous shadow. The lungs are otherwise clear. No pneumothorax or pleural effusion. Cardiac size within normal limits. No acute bone Abnormality. 2/26: KUB>>Normal abdominal gas pattern. Inferior pelvis excluded from view. No definite gross free  intraperitoneal gas. No organomegaly. Vascular calcifications are seen within the abdominal aorta. Osseous structures are age-appropriate. 2/26: CT Abdomen & Pelvis>>1. Severe Acute Pancreatitis, with subtotal associated Pancreatic Necrosis. 2. Moderate volume free fluid and inflammation throughout the upper abdomen. No organized or rim enhancing fluid collection at this time. No vascular occlusion or other complicating features. 3. Infrarenal abdominal aortic aneurysm measuring 31 mm. Recommend follow-up Ultrasound every 3 years. This recommendation follows ACR consensus guidelines: White Paper of the ACR Incidental Findings Committee II on Vascular Findings. J Am Coll Radiol 2013; 07:[id number]. 4. Aortic Atherosclerosis (ICD10-I70.0) and Emphysema (ICD10-J43.9). 2/26: CT Head>>No acute intracranial abnormality. Normal examination. 2/26: US Abdomen (RUQ)>>1. Trace volume of ascites adjacent to the liver. 2. Otherwise, unremarkable examination. Specifically, no gallstones, signs of acute cholecystitis, or evidence of biliary tract Dilatation. 2/28: Echocardiogram>>1. Left ventricular ejection fraction, by estimation, is 65 to 70%. The  left ventricle has normal function. The left ventricle has no regional  wall motion abnormalities. Left ventricular diastolic parameters are  consistent with Grade I diastolic  dysfunction (impaired relaxation).  2. Right ventricular systolic function is normal. The right ventricular  size is normal. Tricuspid regurgitation signal is inadequate for assessing  PA pressure.  3. The mitral valve is normal in structure. No evidence of mitral valve  regurgitation. No evidence of mitral stenosis.  4. The aortic valve is normal in structure. Aortic valve regurgitation is  not visualized. Mild aortic valve stenosis. Aortic valve area, by VTI  measures 2.07 cm. Aortic valve mean gradient measures 6.0 mmHg.  5. The inferior vena cava is normal in size with  greater than 50%  respiratory variability, suggesting right atrial pressure of 3  mmHg 2/28:CXR>>Normal heart size, mediastinal contours, and pulmonary vascularity. LEFT pleural effusion new since previous exam with LEFT basilar atelectasis.Subsegmental atelectasis at RIGHT base as well. Upper lungs clear. No pneumothorax. Bones demineralized.     Antimicrobials:  2/26: SARS-CoV-2 PCR>> negative 2/26: Influenza A&B PCR>> negative 2/26: Blood culture x2>> no growth to date 2/26: MRSA PCR>> negative  Cefepime 2/26>> 2/27 Flagyl 2/26>> 2/27 Zosyn 2/27>>  Subjective: No n/v. Less abdominal pain. Tolerating food Objective: Vitals:   03/05/20 2044 03/06/20 0020 03/06/20 0510 03/06/20 0745  BP: 130/67 122/75 (!) 143/71 134/65  Pulse: (!) 103 95 91 94  Resp: 18 20 20 18   Temp: 98.4 F (36.9 C) 98.4 F (36.9 C) 98.4 F (36.9 C) 99.1 F (37.3 C)  TempSrc: Oral Oral Oral Oral  SpO2: 95% 100% 98% 97%  Weight:      Height:        Intake/Output Summary (Last 24 hours) at 03/06/2020 0813 Last data filed at 03/05/2020 1401 Gross per 24 hour  Intake 255.66 ml  Output 200 ml  Net 55.66 ml   Filed Weights   02/29/20 0941 03/01/20 0500 03/05/20 0415  Weight: 70.6 kg 70.9 kg 74.3 kg    Examination: Calm, NAD sitting in chair son at bedside Decreased breath sounds at bases no wheezing Regular S1-S2 no gallops Soft less distended positive bowel sounds minimal tenderness at epigastric No edema Alert oriented x3 grossly intact    Data Reviewed: I have personally reviewed following labs and imaging studies  CBC: Recent Labs  Lab 02/29/20 0110 02/29/20 1551 03/01/20 0628 03/02/20 0625 03/03/20 0331 03/04/20 0525 03/05/20 0749  WBC 28.5*   < > 16.5* 14.7* 10.5 12.8* 10.9*  NEUTROABS 22.3*  --  13.3*  --  8.3* 9.0* 7.1  HGB 17.5*   < > 15.0 13.5 11.9* 11.6* 10.2*  HCT 51.9*   < > 45.1 41.3 36.6 35.1* 31.9*  MCV 94.7   < > 94.0 95.4 96.8 96.2 97.6  PLT 273   < > 141* 124*  103* 131* 148*   < > = values in this interval not displayed.   Basic Metabolic Panel: Recent Labs  Lab 03/01/20 0628 03/02/20 0625 03/03/20 0331 03/03/20 2117 03/04/20 0525 03/05/20 0749 03/06/20 0527  NA 139 141 141  --  138 135  --   K 4.5 4.1 3.4*  --  3.4* 3.6  --   CL 109 111 112*  --  107 104  --   CO2 21* 23 23  --  25 24  --   GLUCOSE 218* 221* 267*  --  126* 139*  --   BUN 20 24* 24*  --  18 15  --   CREATININE 1.31* 1.21* 1.09*  --  1.16* 1.15*  --   CALCIUM 8.2* 8.3* 8.0*  --  8.1* 7.9*  --   MG 2.0 2.0 1.9  --  2.0 2.1 2.3  PHOS 3.5 2.4* 1.5* 2.8 2.4* 2.4* 2.0*   GFR: Estimated Creatinine Clearance: 44.2 mL/min (A) (by C-G formula based on SCr of 1.15 mg/dL (H)). Liver Function Tests: Recent Labs  Lab 02/29/20 0110 03/01/20 0628 03/03/20 0331 03/04/20 0525 03/05/20 0749  AST 25 44* 42* 26 23  ALT 16 14 17 16 15   ALKPHOS 51 39 37* 50 68  BILITOT 0.5 1.5* 2.0* 1.5* 1.5*  PROT 6.7 5.3* 5.3* 5.7* 5.0*  ALBUMIN 4.0 2.9* 2.5* 2.5* 2.2*   Recent Labs  Lab 02/29/20 0110  LIPASE 5,191*  No results for input(s): AMMONIA in the last 168 hours. Coagulation Profile: No results for input(s): INR, PROTIME in the last 168 hours. Cardiac Enzymes: No results for input(s): CKTOTAL, CKMB, CKMBINDEX, TROPONINI in the last 168 hours. BNP (last 3 results) No results for input(s): PROBNP in the last 8760 hours. HbA1C: No results for input(s): HGBA1C in the last 72 hours. CBG: Recent Labs  Lab 03/05/20 0804 03/05/20 1129 03/05/20 1741 03/05/20 2101 03/06/20 0801  GLUCAP 129* 150* 170* 78 98   Lipid Profile: No results for input(s): CHOL, HDL, LDLCALC, TRIG, CHOLHDL, LDLDIRECT in the last 72 hours. Thyroid Function Tests: No results for input(s): TSH, T4TOTAL, FREET4, T3FREE, THYROIDAB in the last 72 hours. Anemia Panel: No results for input(s): VITAMINB12, FOLATE, FERRITIN, TIBC, IRON, RETICCTPCT in the last 72 hours. Sepsis Labs: Recent Labs  Lab  02/29/20 0455 02/29/20 0956 02/29/20 2148 02/29/20 2148 03/01/20 0628 03/02/20 0625 03/03/20 0331 03/04/20 0525  PROCALCITON  --   --  1.60   < > 1.55 2.03 1.78 1.32  LATICACIDVEN 5.5* 9.4* 3.1*  --  1.9  --   --   --    < > = values in this interval not displayed.    Recent Results (from the past 240 hour(s))  Resp Panel by RT-PCR (Flu A&B, Covid) Nasopharyngeal Swab     Status: None   Collection Time: 02/29/20  2:53 AM   Specimen: Nasopharyngeal Swab; Nasopharyngeal(NP) swabs in vial transport medium  Result Value Ref Range Status   SARS Coronavirus 2 by RT PCR NEGATIVE NEGATIVE Final    Comment: (NOTE) SARS-CoV-2 target nucleic acids are NOT DETECTED.  The SARS-CoV-2 RNA is generally detectable in upper respiratory specimens during the acute phase of infection. The lowest concentration of SARS-CoV-2 viral copies this assay can detect is 138 copies/mL. A negative result does not preclude SARS-Cov-2 infection and should not be used as the sole basis for treatment or other patient management decisions. A negative result may occur with  improper specimen collection/handling, submission of specimen other than nasopharyngeal swab, presence of viral mutation(s) within the areas targeted by this assay, and inadequate number of viral copies(<138 copies/mL). A negative result must be combined with clinical observations, patient history, and epidemiological information. The expected result is Negative.  Fact Sheet for Patients:  BloggerCourse.com  Fact Sheet for Healthcare Providers:  SeriousBroker.it  This test is no t yet approved or cleared by the [place] FDA and  has been authorized for detection and/or diagnosis of SARS-CoV-2 by FDA under an Emergency Use Authorization (EUA). This EUA will remain  in effect (meaning this test can be used) for the duration of the COVID-19 declaration under Section 564(b)(1) of the Act,  21 U.S.C.section 360bbb-3(b)(1), unless the authorization is terminated  or revoked sooner.       Influenza A by PCR NEGATIVE NEGATIVE Final   Influenza B by PCR NEGATIVE NEGATIVE Final    Comment: (NOTE) The Xpert Xpress SARS-CoV-2/FLU/RSV plus assay is intended as an aid in the diagnosis of influenza from Nasopharyngeal swab specimens and should not be used as a sole basis for treatment. Nasal washings and aspirates are unacceptable for Xpert Xpress SARS-CoV-2/FLU/RSV testing.  Fact Sheet for Patients: BloggerCourse.com  Fact Sheet for Healthcare Providers: SeriousBroker.it  This test is not yet approved or cleared by the [place] FDA and has been authorized for detection and/or diagnosis of SARS-CoV-2 by FDA under an Emergency Use Authorization (EUA). This EUA will remain in effect (meaning this  test can be used) for the duration of the COVID-19 declaration under Section 564(b)(1) of the Act, 21 U.S.C. section 360bbb-3(b)(1), unless the authorization is terminated or revoked.  Performed at [facility], [address] [place], [place] [address]   Culture, blood (Routine X 2) w Reflex to ID Panel     Status: None   Collection Time: 02/29/20  3:26 AM   Specimen: BLOOD  Result Value Ref Range Status   Specimen Description BLOOD RIGHT ANTECUBITAL  Final   Special Requests   Final    BOTTLES DRAWN AEROBIC AND ANAEROBIC Blood Culture adequate volume   Culture   Final    NO GROWTH 5 DAYS Performed at [facility], [address] [place], [place] [address]    Report Status 03/05/2020 FINAL  Final  Culture, blood (Routine X 2) w Reflex to ID Panel     Status: None   Collection Time: 02/29/20  3:43 AM   Specimen: BLOOD  Result Value Ref Range Status   Specimen Description BLOOD BLOOD RIGHT HAND  Final   Special Requests   Final    BOTTLES DRAWN AEROBIC AND ANAEROBIC Blood Culture results may not be  optimal due to an inadequate volume of blood received in culture bottles   Culture   Final    NO GROWTH 5 DAYS Performed at [facility], [address] [place], [place] [address]    Report Status 03/05/2020 FINAL  Final  MRSA PCR Screening     Status: None   Collection Time: 02/29/20  9:45 AM   Specimen: Nasopharyngeal  Result Value Ref Range Status   MRSA by PCR NEGATIVE NEGATIVE Final    Comment:        The GeneXpert MRSA Assay (FDA approved for NASAL specimens only), is one component of a comprehensive MRSA colonization surveillance program. It is not intended to diagnose MRSA infection nor to guide or monitor treatment for MRSA infections. Performed at [facility], [address] [place], [place] [address]   CULTURE, BLOOD (ROUTINE X 2) w Reflex to ID Panel     Status: None (Preliminary result)   Collection Time: 03/05/20 10:02 AM   Specimen: BLOOD  Result Value Ref Range Status   Specimen Description BLOOD LEFT AC  Final   Special Requests   Final    BOTTLES DRAWN AEROBIC AND ANAEROBIC Blood Culture adequate volume   Culture   Final    NO GROWTH < 24 HOURS Performed at [facility], [address] [place], [place] [address]    Report Status PENDING  Incomplete  CULTURE, BLOOD (ROUTINE X 2) w Reflex to ID Panel     Status: None (Preliminary result)   Collection Time: 03/05/20 10:03 AM   Specimen: BLOOD  Result Value Ref Range Status   Specimen Description BLOOD [facility]  Final   Special Requests   Final    BOTTLES DRAWN AEROBIC AND ANAEROBIC Blood Culture adequate volume   Culture   Final    NO GROWTH < 24 HOURS Performed at [facility], [address], [place], [place] [address]    Report Status PENDING  Incomplete         Radiology Studies: DG Abd 1 View  Result Date: 03/05/2020 CLINICAL DATA:  Abdominal distension and pain EXAM: ABDOMEN - 1 VIEW COMPARISON:  02/29/2020 FINDINGS: Scattered large and small bowel gas  is noted. No obstructive changes are seen. No free air is noted. Degenerative changes of  lumbar spine are seen. Diffuse aortic calcifications are noted. IMPRESSION: No acute abnormality noted. Electronically Signed   By: Alcide Clever M.D.   On: 03/05/2020 13:16   DG Chest Port 1 View  Result Date: 03/05/2020 CLINICAL DATA:  Fever. EXAM: PORTABLE CHEST 1 VIEW COMPARISON:  Chest x-ray 02/29/2020. FINDINGS: Mediastinum and hilar structures normal. Heart size normal. Low lung volumes. Bibasilar pulmonary infiltrates/edema and bilateral pleural effusions. Mild thoracic spine scoliosis. No acute bony abnormality. IMPRESSION: Low lung volumes. Bibasilar pulmonary infiltrates/edema and bilateral pleural effusions. Bibasilar pneumonia could present in this fashion. Electronically Signed   By: Maisie Fus  Register   On: 03/05/2020 09:53        Scheduled Meds: . Chlorhexidine Gluconate Cloth  6 each Topical Q0600  . feeding supplement  1 Container Oral TID BM  . fluticasone  2 spray Each Nare Daily  . heparin injection (subcutaneous)  5,000 Units Subcutaneous Q8H  . insulin aspart  0-20 Units Subcutaneous TID WC  . insulin aspart  0-5 Units Subcutaneous QHS  . insulin detemir  10 Units Subcutaneous Daily  . multivitamin with minerals  1 tablet Oral Daily  . pantoprazole (PROTONIX) IV  40 mg Intravenous Q24H   Continuous Infusions: . piperacillin-tazobactam (ZOSYN)  IV 3.375 g (03/06/20 0546)    Assessment & Plan:   Active Problems:   Acute pancreatic necrosis   Seizure (HCC)   Necrotizing pancreatitis   Acute Necrotizing Pancreatitis -No role for surgery or perc drain at this time, there is not a discrete collection that would benefit -There is no obvious intervention required at the level of the gallbladder or duct -Will maintain contact with [facility] for potential need of IR if come to that point -2/3-Monitor fever curve and trend WBC and procalcitonin  Procalcitonin is decreasing  Lactic  acid normalized  Ethanol level less than 10, triglyceride 169  Right upper quadrant abdominal ultrasound on 2/26 without gallstones, acute cholecystitis, or biliary tract dilatation  3/3-GI consulted since pt febrile and has pain GIs input was appreciated-clear and apparently is improving.  If patient's symptoms increase or patient starts to date.  Recommend repeating CT scan of the abdomen but for now hold off on CT scan. 3/4-GI signed off. No sign of pancreatic necrosis being infected per GI.  Since cbc better, will observe off zosyn  Will dc iv zosyn abd xray with gas no obstruction- simethicone was added on 3/3 and now pt feels better, and less distended    Fever- possibly atelectasis? Doubt pna. Patient has been on Zosyn WBC has gone down Procalcitonin down UA negative cultures pending We will DC Zosyn and observe off Zosyn for now    Acute Hypoxic Respiratory Failure in the setting of Atelectasis and Left Pleural Effusion -Supplemental O2 as needed to maintain O2 sats >92% -Follow intermittent CXR and ABG as needed -CXR on 2/28 with new LEFT pleural effusion with bibasilar atelectasis greater on LEFT 3/4 -pleural effusion due to pancreatitis Echo 02/23/2020 with LVEF 65 to 70%, grade 1 diastolic dysfunction Flutter valve and incentive spirometer Wean down O2 as tolerated to keep O2 sat above 92% DC IV fluids Check BNP in a.m.   Acute Metabolic Encephalopathy>>resolved Generalized Tonic-Clonic Seizure Has first-time seizure in the ED.  This is provoked episodes in the setting of significant pancreatitis with lactic acidosis. 3/3-EEG -received a mild diffuse encephalopathy, nonspecific etiology.  Possibly toxic metabolic etiology.  No seizure or definite epileptiform discharges were seen  3/4-Per neurology Keppra was discontinued  No indication for AED at this time  Neurology signed off        Acute Kidney Injury>>improving Creatinine 1.15 Continue to monitor  levels Encourage p.o. intake Ensure adequate renal perfusion Avoid nephrotoxic agents    Hyperglycemia BG levels stable now Continue to monitor Continue RISS and levemir Ck A1c      DVT prophylaxis: Heparin subcu Code Status: DNR Family Communication: son at bedside  Status is: Inpatient  Remains inpatient appropriate because:Inpatient level of care appropriate due to severity of illness   Dispo: The patient is from: Home              Anticipated d/c is to: home with home health              Patient currently is not medically stable to d/c.   Difficult to place patient No  Likely dc in 2  days , still with fevers.            LOS: 6 days   Time spent: 35 minutes with more than 50% on COC    Lynn Ito, MD [facility] Pager [phone]  If 7PM-7AM, please contact night-coverage 03/06/2020, 8:13 AM

## 2020-03-06 NOTE — Progress Notes (Signed)
Holly Minium, MD [facility]   [address], [address] [place], [place] [address] Phone: [phone] Fax : [phone]   Subjective: The patient reports that her pain is much less today than it was before.  She is not having any new issues.  The patient has been transferred from the ICU to the regular floor.  She is tolerating a p.o. diet.   Objective: Vital signs in last 24 hours: Vitals:   03/06/20 0020 03/06/20 0510 03/06/20 0745 03/06/20 1133  BP: 122/75 (!) 143/71 134/65 (!) 153/76  Pulse: 95 91 94 93  Resp: 20 20 18    Temp: 98.4 F (36.9 C) 98.4 F (36.9 C) 99.1 F (37.3 C) 98.3 F (36.8 C)  TempSrc: Oral Oral Oral   SpO2: 100% 98% 97% 95%  Weight:      Height:       Weight change:   Intake/Output Summary (Last 24 hours) at 03/06/2020 1311 Last data filed at 03/06/2020 1028 Gross per 24 hour  Intake 255.66 ml  Output --  Net 255.66 ml     Exam: General: Alert and orientated x3 in no apparent distress  Lab Results: @LABTEST2 @ Micro Results: Recent Results (from the past 240 hour(s))  Resp Panel by RT-PCR (Flu A&B, Covid) Nasopharyngeal Swab     Status: None   Collection Time: 02/29/20  2:53 AM   Specimen: Nasopharyngeal Swab; Nasopharyngeal(NP) swabs in vial transport medium  Result Value Ref Range Status   SARS Coronavirus 2 by RT PCR NEGATIVE NEGATIVE Final    Comment: (NOTE) SARS-CoV-2 target nucleic acids are NOT DETECTED.  The SARS-CoV-2 RNA is generally detectable in upper respiratory specimens during the acute phase of infection. The lowest concentration of SARS-CoV-2 viral copies this assay can detect is 138 copies/mL. A negative result does not preclude SARS-Cov-2 infection and should not be used as the sole basis for treatment or other patient management decisions. A negative result may occur with  improper specimen collection/handling, submission of specimen other than nasopharyngeal swab, presence of viral mutation(s) within the areas targeted by  this assay, and inadequate number of viral copies(<138 copies/mL). A negative result must be combined with clinical observations, patient history, and epidemiological information. The expected result is Negative.  Fact Sheet for Patients:   Fact Sheet for Healthcare Providers:  03/02/20  This test is no t yet approved or cleared by the [employer] FDA and  has been authorized for detection and/or diagnosis of SARS-CoV-2 by FDA under an Emergency Use Authorization (EUA). This EUA will remain  in effect (meaning this test can be used) for the duration of the COVID-19 declaration under Section 564(b)(1) of the Act, 21 U.S.C.section 360bbb-3(b)(1), unless the authorization is terminated  or revoked sooner.       Influenza A by PCR NEGATIVE NEGATIVE Final   Influenza B by PCR NEGATIVE NEGATIVE Final    Comment: (NOTE) The Xpert Xpress SARS-CoV-2/FLU/RSV plus assay is intended as an aid in the diagnosis of influenza from Nasopharyngeal swab specimens and should not be used as a sole basis for treatment. Nasal washings and aspirates are unacceptable for Xpert Xpress SARS-CoV-2/FLU/RSV testing.  Fact Sheet for Patients: SeriousBroker.it  Fact Sheet for Healthcare Providers: [place]  This test is not yet approved or cleared by the [employer] FDA and has been authorized for detection and/or diagnosis of SARS-CoV-2 by FDA under an Emergency Use Authorization (EUA). This EUA will remain in effect (meaning this test can be used) for the duration of the  COVID-19 declaration under Section 564(b)(1) of the Act, 21 U.S.C. section 360bbb-3(b)(1), unless the authorization is terminated or revoked.  Performed at [facility], [address] [place], [place] [address]   Culture, blood (Routine X 2) w Reflex to ID Panel     Status: None    Collection Time: 02/29/20  3:26 AM   Specimen: BLOOD  Result Value Ref Range Status   Specimen Description BLOOD RIGHT ANTECUBITAL  Final   Special Requests   Final    BOTTLES DRAWN AEROBIC AND ANAEROBIC Blood Culture adequate volume   Culture   Final    NO GROWTH 5 DAYS Performed at [facility], [address] [place], [place] [address]    Report Status 03/05/2020 FINAL  Final  Culture, blood (Routine X 2) w Reflex to ID Panel     Status: None   Collection Time: 02/29/20  3:43 AM   Specimen: BLOOD  Result Value Ref Range Status   Specimen Description BLOOD BLOOD RIGHT HAND  Final   Special Requests   Final    BOTTLES DRAWN AEROBIC AND ANAEROBIC Blood Culture results may not be optimal due to an inadequate volume of blood received in culture bottles   Culture   Final    NO GROWTH 5 DAYS Performed at [facility], [address] [place], [place] [address]    Report Status 03/05/2020 FINAL  Final  MRSA PCR Screening     Status: None   Collection Time: 02/29/20  9:45 AM   Specimen: Nasopharyngeal  Result Value Ref Range Status   MRSA by PCR NEGATIVE NEGATIVE Final    Comment:        The GeneXpert MRSA Assay (FDA approved for NASAL specimens only), is one component of a comprehensive MRSA colonization surveillance program. It is not intended to diagnose MRSA infection nor to guide or monitor treatment for MRSA infections. Performed at [facility], [address] [place], [place] [address]   Urine Culture     Status: None   Collection Time: 03/05/20  9:16 AM   Specimen: Urine, Catheterized  Result Value Ref Range Status   Specimen Description   Final    URINE, CATHETERIZED Performed at [facility], [address], [place], [place] [address]    Special Requests   Final    NONE Performed at [facility], [address], [place], [place] [address]    Culture   Final    NO GROWTH Performed at [facility],  [address] [place], [place] [address]    Report Status 03/06/2020 FINAL  Final  CULTURE, BLOOD (ROUTINE X 2) w Reflex to ID Panel     Status: None (Preliminary result)   Collection Time: 03/05/20 10:02 AM   Specimen: BLOOD  Result Value Ref Range Status   Specimen Description BLOOD LEFT AC  Final   Special Requests   Final    BOTTLES DRAWN AEROBIC AND ANAEROBIC Blood Culture adequate volume   Culture   Final    NO GROWTH < 24 HOURS Performed at [facility], [address] [place], [place] [address]    Report Status PENDING  Incomplete  CULTURE, BLOOD (ROUTINE X 2) w Reflex to ID Panel     Status: None (Preliminary result)   Collection Time: 03/05/20 10:03 AM   Specimen: BLOOD  Result Value Ref Range Status   Specimen Description BLOOD [facility]  Final   Special Requests   Final  BOTTLES DRAWN AEROBIC AND ANAEROBIC Blood Culture adequate volume   Culture   Final    NO GROWTH < 24 HOURS Performed at [facility], [address] [place], [place] [address]    Report Status PENDING  Incomplete   Studies/Results: DG Abd 1 View  Result Date: 03/05/2020 CLINICAL DATA:  Abdominal distension and pain EXAM: ABDOMEN - 1 VIEW COMPARISON:  02/29/2020 FINDINGS: Scattered large and small bowel gas is noted. No obstructive changes are seen. No free air is noted. Degenerative changes of lumbar spine are seen. Diffuse aortic calcifications are noted. IMPRESSION: No acute abnormality noted. Electronically Signed   By: Alcide Clever M.D.   On: 03/05/2020 13:16   DG Chest Port 1 View  Result Date: 03/05/2020 CLINICAL DATA:  Fever. EXAM: PORTABLE CHEST 1 VIEW COMPARISON:  Chest x-ray 02/29/2020. FINDINGS: Mediastinum and hilar structures normal. Heart size normal. Low lung volumes. Bibasilar pulmonary infiltrates/edema and bilateral pleural effusions. Mild thoracic spine scoliosis. No acute bony abnormality. IMPRESSION: Low lung volumes. Bibasilar pulmonary infiltrates/edema and bilateral  pleural effusions. Bibasilar pneumonia could present in this fashion. Electronically Signed   By: Maisie Fus  Register   On: 03/05/2020 09:53   Medications: I have reviewed the patient's current medications. Scheduled Meds: . Chlorhexidine Gluconate Cloth  6 each Topical Q0600  . feeding supplement  1 Container Oral TID BM  . fluticasone  2 spray Each Nare Daily  . heparin injection (subcutaneous)  5,000 Units Subcutaneous Q8H  . insulin aspart  0-20 Units Subcutaneous TID WC  . insulin aspart  0-5 Units Subcutaneous QHS  . insulin detemir  10 Units Subcutaneous Daily  . multivitamin with minerals  1 tablet Oral Daily  . pantoprazole (PROTONIX) IV  40 mg Intravenous Q24H  . phosphorus  500 mg Oral Q4H   Continuous Infusions: . piperacillin-tazobactam (ZOSYN)  IV 3.375 g (03/06/20 0546)   PRN Meds:.HYDROcodone-acetaminophen, HYDROmorphone (DILAUDID) injection   Assessment: Active Problems:   Acute pancreatic necrosis   Seizure (HCC)   Necrotizing pancreatitis    Plan: This patient came in with pancreatitis and I started seeing the patient yesterday for necrotizing pancreatitis after the patient had substantially improved and tolerating a p.o. diet.  There is nothing further to do from a GI point of view since the patient is doing well.  As far as treating with antibiotics I will leave that for the hospitalist versus infectious disease since there is no sign of the pancreatic necrosis being infected and the infection is likely from another source.  I will sign off.  Please call if any further GI concerns or questions.  We would like to thank you for the opportunity to participate in the care of Holly Navarro.     LOS: 6 days   Sherlyn Hay 03/06/2020, 1:11 PM Pager [phone] 7am-5pm  Check AMION for 5pm -7am coverage and on weekends

## 2020-03-06 NOTE — Clinical Social Work Note (Signed)
CSW acknowledges consult for HH/DME needs. PT evaluation pending. OT evaluated yesterday and recommending SNF. Will follow up once PT recommendations are available.  Charlynn Court, CSW [phone]

## 2020-03-06 NOTE — Care Management Important Message (Signed)
Important Message  Patient Details  Name: Holly Navarro MRN: [id number] Date of Birth: 1945-04-10   Medicare Important Message Given:  Yes     Caryn Section, RN 03/06/2020, 2:52 PM

## 2020-03-06 NOTE — Progress Notes (Signed)
   03/06/20 0920  Clinical Encounter Type  Visited With Patient and family together  Visit Type Follow-up;Spiritual support;Social support  Referral From Chaplain  Consult/Referral To Chaplain  Ch follow up with Pt per request from morning Ch for AD. I completed AD with Pt and notary Betty. Ch will follow up with Pt.

## 2020-03-06 NOTE — Evaluation (Signed)
Physical Therapy Evaluation Patient Details Name: Holly Navarro MRN: [id number] DOB: 06/15/1945 Today's Date: 03/06/2020   History of Present Illness  Pt is a 75 y.o. female with history of hypertension, hyperlipidemia, gastric ulcer who presented to [facility] emergency department for sudden onset diffuse severe sharp abdominal pain. MD assessment includes: acute necrotizing pancreatitis, AKI, and acute hypoxic respiratory failure in the setting of atelectasis and left pleural effusion.    Clinical Impression  Pt was pleasant and motivated to participate during the session but overall was limited by fatigue.  Pt demonstrated fair to good functional strength and stability during the session but poor activity tolerance with pt ambulating a max of 5 feet per patient secondary to fatigue.  Pt's SpO2 and HR were WNL during the session with no adverse symptoms reported. Pt will benefit from HHPT services upon discharge to safely address deficits listed in patient problem list for decreased caregiver assistance and eventual return to PLOF.      Follow Up Recommendations Home health PT;Supervision for mobility/OOB    Equipment Recommendations  Rolling walker with 5" wheels    Recommendations for Other Services       Precautions / Restrictions Precautions Precautions: Fall Restrictions Weight Bearing Restrictions: No      Mobility  Bed Mobility Overal bed mobility: Modified Independent             General bed mobility comments: Extra time/effort only    Transfers Overall transfer level: Needs assistance Equipment used: Rolling walker (2 wheeled) Transfers: Sit to/from Stand Sit to Stand: Min guard         General transfer comment: Fair eccentric and concentric control with min-mod verbal cues for hand placement  Ambulation/Gait Ambulation/Gait assistance: Min guard Gait Distance (Feet): 5 Feet Assistive device: Rolling walker (2 wheeled) Gait Pattern/deviations: Step-through  pattern;Decreased step length - right;Decreased step length - left Gait velocity: decreased   General Gait Details: Pt steady with ambulation with the RW but declined to attempt increased amb distance secondary to fatigue  Stairs            Wheelchair Mobility    Modified Rankin (Stroke Patients Only)       Balance Overall balance assessment: Needs assistance Sitting-balance support: No upper extremity supported;Feet supported Sitting balance-Leahy Scale: Good     Standing balance support: Bilateral upper extremity supported;During functional activity Standing balance-Leahy Scale: Good Standing balance comment: Minimal lean on the RW for support                             Pertinent Vitals/Pain Pain Assessment: No/denies pain    Home Living Family/patient expects to be discharged to:: Private residence Living Arrangements: Alone Available Help at Discharge: Family;Available 24 hours/day Type of Home: House Home Access: Stairs to enter Entrance Stairs-Rails: Doctor, general practice of Steps: 6 Home Layout: One level Home Equipment: None      Prior Function Level of Independence: Independent         Comments: Ind with amb without an AD, Ind with ADLs     Hand Dominance        Extremity/Trunk Assessment   Upper Extremity Assessment Upper Extremity Assessment: Generalized weakness    Lower Extremity Assessment Lower Extremity Assessment: Generalized weakness       Communication   Communication: No difficulties  Cognition Arousal/Alertness: Awake/alert Behavior During Therapy: WFL for tasks assessed/performed Overall Cognitive Status: Within Functional Limits for tasks assessed  General Comments      Exercises Total Joint Exercises Ankle Circles/Pumps: AROM;Strengthening;Both;10 reps Quad Sets: Strengthening;Both;10 reps Gluteal Sets: Strengthening;Both;10  reps Heel Slides: AROM;Strengthening;Both;5 reps Hip ABduction/ADduction: AROM;Strengthening;Both;5 reps Straight Leg Raises: 5 reps;Both;Strengthening;AROM Other Exercises Other Exercises: HEP education per above exercises x 10 each 2-3x/day   Assessment/Plan    PT Assessment Patient needs continued PT services  PT Problem List Decreased strength;Decreased activity tolerance;Decreased balance;Decreased mobility;Decreased knowledge of use of DME       PT Treatment Interventions DME instruction;Gait training;Stair training;Functional mobility training;Therapeutic activities;Therapeutic exercise;Balance training;Patient/family education    PT Goals (Current goals can be found in the Care Plan section)  Acute Rehab PT Goals Patient Stated Goal: To return home independently PT Goal Formulation: With patient Time For Goal Achievement: 03/19/20 Potential to Achieve Goals: Good    Frequency Min 2X/week   Barriers to discharge        Co-evaluation               AM-PAC PT "6 Clicks" Mobility  Outcome Measure Help needed turning from your back to your side while in a flat bed without using bedrails?: A Little Help needed moving from lying on your back to sitting on the side of a flat bed without using bedrails?: A Little Help needed moving to and from a bed to a chair (including a wheelchair)?: A Little Help needed standing up from a chair using your arms (e.g., wheelchair or bedside chair)?: A Little Help needed to walk in hospital room?: A Little Help needed climbing 3-5 steps with a railing? : A Little 6 Click Score: 18    End of Session Equipment Utilized During Treatment: Gait belt Activity Tolerance: Patient limited by fatigue Patient left: in bed;with call bell/phone within reach;with bed alarm set;with family/visitor present Nurse Communication: Mobility status PT Visit Diagnosis: Muscle weakness (generalized) (M62.81);Difficulty in walking, not elsewhere classified  (R26.2)    Time: 1038-1100 PT Time Calculation (min) (ACUTE ONLY): 22 min   Charges:   PT Evaluation $PT Eval Moderate Complexity: 1 Mod PT Treatments $Therapeutic Exercise: 8-22 mins       D. Scott Seeley [place] PT, DPT 03/06/20, 1:45 PM

## 2020-03-06 NOTE — Consult Note (Signed)
PHARMACY CONSULT NOTE  Pharmacy Consult for Electrolyte Monitoring and Replacement   Recent Labs: Potassium (mmol/L)  Date Value  03/05/2020 3.6   Magnesium (mg/dL)  Date Value  11/19/3565 2.3   Calcium (mg/dL)  Date Value  01/41/0301 7.9 (L)   Albumin (g/dL)  Date Value  31/43/8887 2.2 (L)  10/22/2014 4.3   Phosphorus (mg/dL)  Date Value  57/97/2820 2.0 (L)   Sodium (mmol/L)  Date Value  03/05/2020 135  10/22/2014 140   Assessment: Patient is a 75 y/o F with medical history including HTN, CKD who was admitted 2/26 with severe acute pancreatitis. Pharmacy has been consulted to assist with electrolyte monitoring and replacement as indicated.   Labs on admission notable for lipase 5191, K 2.4, Scr 1.36, LA 5.5, WBC 28.5  Goal of Therapy:  Electrolytes within normal limits  Plan:  -- Will order KPhos 2 tabs x 2 due to low phos. Pharmacy will sign off as pt has transferred from the ICU.   Ronnald Ramp, PharmD, BCPS Clinical Pharmacist 03/06/2020 8:17 AM

## 2020-03-06 NOTE — Progress Notes (Signed)
Occupational Therapy Treatment Patient Details Name: Holly Navarro MRN: [id number] DOB: August 19, 1945 Today's Date: 03/06/2020    History of present illness Holly Navarro is a 75 y.o. female with history of hypertension, hyperlipidemia, gastric ulcer who presented to [facility] emergency department for sudden onset diffuse severe sharp abdominal pain.   OT comments  Holly Navarro was seen for OT treatment on this date. Upon arrival to room pt reclined in bed, eager for breakfast and agreeable to OOB to chair. Pt requires SUPERVISION for bed mobility and don/doff B socks seated EOB. MIN A sit<>stand - pt pulls on RW to stand, flatly refuses education to perform safely, requires assist to stabilize RW. SBA + RW for ~15 ft mobility in room, pt desat 87% on RA during ambulation, resolved to mid 90s on 3.5L [place].    Son in room t/o session. Pt continues to benefit from skilled OT services to maximize return to PLOF and minimize risk of future falls, injury, caregiver burden, and readmission. Will continue to follow POC. Discharge recommendation, frequency, and goals updated to reflect pt progress. Pt     Follow Up Recommendations  Home health OT;Supervision/Assistance - 24 hour(Initial 24/7, may progress)    Equipment Recommendations  Other (comment) (2WW)    Recommendations for Other Services      Precautions / Restrictions Precautions Precautions: Fall Restrictions Weight Bearing Restrictions: No       Mobility Bed Mobility Overal bed mobility: Needs Assistance Bed Mobility: Supine to Sit     Supine to sit: Supervision          Transfers Overall transfer level: Needs assistance Equipment used: Rolling walker (2 wheeled) Transfers: Sit to/from Stand Sit to Stand: Min assist         General transfer comment: Pt pulls on RW to stand, flatly refuses education to perform safely, requires MIN A to stabilize RW    Balance Overall balance assessment: Needs assistance Sitting-balance  support: No upper extremity supported;Feet supported Sitting balance-Leahy Scale: Good     Standing balance support: Bilateral upper extremity supported Standing balance-Leahy Scale: Good                             ADL either performed or assessed with clinical judgement   ADL Overall ADL's : Needs assistance/impaired                                       General ADL Comments: SUPERVISION don/doff B socks seated EOB. SBA + RW for toilet t/f - assist for lines mgmgt and VCs for safe RW use.               Cognition Arousal/Alertness: Awake/alert Behavior During Therapy: Flat affect Overall Cognitive Status: Within Functional Limits for tasks assessed                                 General Comments: Pt is A&O x4, poor safety awareness        Exercises Exercises: Other exercises Other Exercises Other Exercises: Pt and family educated re: OT role, DME recs, d/c recs, falls prevention Other Exercises: LBD, sup>sit, self-drinking, sit<>stand, sitting/standing balance/tolerance, ~15 ft mobility           Pertinent Vitals/ Pain       Pain Assessment: No/denies  pain         Frequency  Min 2X/week        Progress Toward Goals  OT Goals(current goals can now be found in the care plan section)  Progress towards OT goals: Progressing toward goals;Goals met and updated - see care plan  Acute Rehab OT Goals Patient Stated Goal: To return home independently OT Goal Formulation: With patient/family Time For Goal Achievement: 03/19/20 Potential to Achieve Goals: Fair ADL Goals Pt Will Perform Grooming: with modified independence;standing Pt Will Perform Lower Body Dressing: with modified independence;sit to/from stand Pt Will Transfer to Toilet: with modified independence;ambulating;regular height toilet  Plan Discharge plan needs to be updated;Frequency needs to be updated;Equipment recommendations need to be updated        AM-PAC OT "6 Clicks" Daily Activity     Outcome Measure   Help from another person eating meals?: None Help from another person taking care of personal grooming?: A Little Help from another person toileting, which includes using toliet, bedpan, or urinal?: A Little Help from another person bathing (including washing, rinsing, drying)?: A Little Help from another person to put on and taking off regular upper body clothing?: None Help from another person to put on and taking off regular lower body clothing?: A Little 6 Click Score: 20    End of Session Equipment Utilized During Treatment: Rolling walker  OT Visit Diagnosis: Other abnormalities of gait and mobility (R26.89);Muscle weakness (generalized) (M62.81)   Activity Tolerance Patient tolerated treatment well   Patient Left in chair;with call bell/phone within reach;with family/visitor present   Nurse Communication          Time: 2230-0979 OT Time Calculation (min): 27 min  Charges: OT General Charges $OT Visit: 1 Visit OT Treatments $Self Care/Home Management : 23-37 mins  Dessie Coma, M.S. OTR/L  03/06/20, 9:42 AM  ascom [phone]

## 2020-03-07 DIAGNOSIS — K8591 Acute pancreatitis with uninfected necrosis, unspecified: Secondary | ICD-10-CM | POA: Diagnosis not present

## 2020-03-07 DIAGNOSIS — N179 Acute kidney failure, unspecified: Secondary | ICD-10-CM | POA: Diagnosis not present

## 2020-03-07 DIAGNOSIS — R509 Fever, unspecified: Secondary | ICD-10-CM | POA: Diagnosis not present

## 2020-03-07 DIAGNOSIS — R569 Unspecified convulsions: Secondary | ICD-10-CM | POA: Diagnosis not present

## 2020-03-07 LAB — BASIC METABOLIC PANEL
Anion gap: 7 (ref 5–15)
BUN: 9 mg/dL (ref 8–23)
CO2: 27 mmol/L (ref 22–32)
Calcium: 7.9 mg/dL — ABNORMAL LOW (ref 8.9–10.3)
Chloride: 104 mmol/L (ref 98–111)
Creatinine, Ser: 1.06 mg/dL — ABNORMAL HIGH (ref 0.44–1.00)
GFR, Estimated: 55 mL/min — ABNORMAL LOW (ref >60)
Glucose, Bld: 170 mg/dL — ABNORMAL HIGH (ref 70–99)
Potassium: 2.8 mmol/L — ABNORMAL LOW (ref 3.5–5.1)
Sodium: 138 mmol/L (ref 135–145)

## 2020-03-07 LAB — CBC
HCT: 30.7 % — ABNORMAL LOW (ref 36.0–46.0)
Hemoglobin: 10 g/dL — ABNORMAL LOW (ref 12.0–15.0)
MCH: 31.3 pg (ref 26.0–34.0)
MCHC: 32.6 g/dL (ref 30.0–36.0)
MCV: 95.9 fL (ref 80.0–100.0)
Platelets: 254 10*3/uL (ref 150–400)
RBC: 3.2 MIL/uL — ABNORMAL LOW (ref 3.87–5.11)
RDW: 12.7 % (ref 11.5–15.5)
WBC: 14.8 10*3/uL — ABNORMAL HIGH (ref 4.0–10.5)
nRBC: 0.1 % (ref 0.0–0.2)

## 2020-03-07 LAB — GLUCOSE, CAPILLARY
Glucose-Capillary: 214 mg/dL — ABNORMAL HIGH (ref 70–99)
Glucose-Capillary: 204 mg/dL — ABNORMAL HIGH (ref 70–99)
Glucose-Capillary: 63 mg/dL — ABNORMAL LOW (ref 70–99)
Glucose-Capillary: 86 mg/dL (ref 70–99)
Glucose-Capillary: 109 mg/dL — ABNORMAL HIGH (ref 70–99)

## 2020-03-07 LAB — PHOSPHORUS: Phosphorus: 2.1 mg/dL — ABNORMAL LOW (ref 2.5–4.6)

## 2020-03-07 LAB — BRAIN NATRIURETIC PEPTIDE: B Natriuretic Peptide: 139 pg/mL — ABNORMAL HIGH (ref 0.0–100.0)

## 2020-03-07 MED ORDER — FUROSEMIDE 10 MG/ML IJ SOLN
20.0000 mg | Freq: Once | INTRAMUSCULAR | Status: AC
  Administered 2020-03-07: 20 mg via INTRAVENOUS
  Filled 2020-03-07: qty 4

## 2020-03-07 MED ORDER — SIMETHICONE 80 MG PO CHEW
80.0000 mg | CHEWABLE_TABLET | Freq: Every day | ORAL | Status: DC
  Administered 2020-03-07 – 2020-03-10 (×4): 80 mg via ORAL
  Filled 2020-03-07 (×4): qty 1

## 2020-03-07 MED ORDER — POTASSIUM CHLORIDE CRYS ER 20 MEQ PO TBCR
40.0000 meq | EXTENDED_RELEASE_TABLET | Freq: Three times a day (TID) | ORAL | Status: DC
  Administered 2020-03-07 – 2020-03-08 (×3): 40 meq via ORAL
  Filled 2020-03-07 (×3): qty 2

## 2020-03-07 NOTE — Progress Notes (Signed)
PROGRESS NOTE    Holly Navarro  WUJ:[id number] DOB: August 22, 1945 DOA: 02/29/2020 PCP: Tarri Fuller, FNP    Brief Narrative:  Holly Navarro a 75 y.o.femalewith history of hypertension, hyperlipidemia, gastric ulcer [facility] diffuse severe sharp abdominal [facility]. The discomfort began Around 10 PM last nightand was associated with onset of nausea and cramps.She did vomit and became diaphoretic after emesis. The pain did radiate across her entire abdomen and to her back. There was no associated diarrhea or lose stool and no hematochezia or hematemesis. She was found to be hypothermic and hypertensive upon her arrival she was warmed and started on fluids. Her evaluation returned a lactic acid of 6. There was a leukocytosis in excess of 20. A CT revealed necrotizing pancreatitis with a corroborating lipase. The case was discussed with our local surgeon who felt she may be best served in transfer. The case was then d/w with [facility] who explained there were no beds and without acute intervention planned, she would likely need to wait and be stabilized at [facility].  She was admitted to the ICU service on 2/26.  On 3/2 she was transferred to [facility] service. 3/3-febrile Tmax 100.2, GI consulted this am 3/4-spiked fever again tmax 100.5.  She was complaining of abdominal distention but improved with simethicone. Eating food today and tolerating 3/5-c/o gas pain again asking for simethicone which helped her before. Tolerating po intake  Consultants:   PCCM, neurology  Procedures:   2/26: CXR>>The lungs are symmetrically well expanded. Benign calcified granuloma within the right mid lung zone. Nodular density at the left lung base likely represents a confluence of vascular and osseous shadow. The lungs are otherwise clear. No pneumothorax or pleural effusion. Cardiac size within normal limits. No acute bone Abnormality. 2/26:  KUB>>Normal abdominal gas pattern. Inferior pelvis excluded from view. No definite gross free intraperitoneal gas. No organomegaly. Vascular calcifications are seen within the abdominal aorta. Osseous structures are age-appropriate. 2/26: CT Abdomen & Pelvis>>1. Severe Acute Pancreatitis, with subtotal associated Pancreatic Necrosis. 2. Moderate volume free fluid and inflammation throughout the upper abdomen. No organized or rim enhancing fluid collection at this time. No vascular occlusion or other complicating features. 3. Infrarenal abdominal aortic aneurysm measuring 31 mm. Recommend follow-up Ultrasound every 3 years. This recommendation follows ACR consensus guidelines: White Paper of the ACR Incidental Findings Committee II on Vascular Findings. J Am Coll Radiol 2013; 95:621-308. 4. Aortic Atherosclerosis (ICD10-I70.0) and Emphysema (ICD10-J43.9). 2/26: CT Head>>No acute intracranial abnormality. Normal examination. 2/26: US Abdomen (RUQ)>>1. Trace volume of ascites adjacent to the liver. 2. Otherwise, unremarkable examination. Specifically, no gallstones, signs of acute cholecystitis, or evidence of biliary tract Dilatation. 2/28: Echocardiogram>>1. Left ventricular ejection fraction, by estimation, is 65 to 70%. The  left ventricle has normal function. The left ventricle has no regional  wall motion abnormalities. Left ventricular diastolic parameters are  consistent with Grade I diastolic  dysfunction (impaired relaxation).  2. Right ventricular systolic function is normal. The right ventricular  size is normal. Tricuspid regurgitation signal is inadequate for assessing  PA pressure.  3. The mitral valve is normal in structure. No evidence of mitral valve  regurgitation. No evidence of mitral stenosis.  4. The aortic valve is normal in structure. Aortic valve regurgitation is  not visualized. Mild aortic valve stenosis. Aortic valve area, by VTI  measures 2.07 cm.  Aortic valve mean gradient measures 6.0 mmHg.  5. The inferior vena cava is normal in size  with greater than 50%  respiratory variability, suggesting right atrial pressure of 3 mmHg 2/28:CXR>>Normal heart size, mediastinal contours, and pulmonary vascularity. LEFT pleural effusion new since previous exam with LEFT basilar atelectasis.Subsegmental atelectasis at RIGHT base as well. Upper lungs clear. No pneumothorax. Bones demineralized.     Antimicrobials:  2/26: SARS-CoV-2 PCR>> negative 2/26: Influenza A&B PCR>> negative 2/26: Blood culture x2>> no growth to date 2/26: MRSA PCR>> negative  Cefepime 2/26>> 2/27 Flagyl 2/26>> 2/27 Zosyn 2/27>>  Subjective: No n/v. Has LLQ pain again, but feels simethicone helped her .  Objective: Vitals:   03/06/20 2024 03/06/20 2345 03/07/20 0502 03/07/20 0754  BP:  124/78 (!) 135/101 138/70  Pulse:  85 96 95  Resp: 20 20 18 18   Temp:  98 F (36.7 C) 98.2 F (36.8 C) 98.5 F (36.9 C)  TempSrc:  Oral Oral Oral  SpO2:  97% 96% 97%  Weight:   71.5 kg   Height:        Intake/Output Summary (Last 24 hours) at 03/07/2020 0827 Last data filed at 03/06/2020 1817 Gross per 24 hour  Intake 420 ml  Output 300 ml  Net 120 ml   Filed Weights   03/01/20 0500 03/05/20 0415 03/07/20 0502  Weight: 70.9 kg 74.3 kg 71.5 kg    Examination: Calm, nad Decrease bs at bases   Regular s1/s2  Soft benign, +bs Trace edeam   Data Reviewed: I have personally reviewed following labs and imaging studies  CBC: Recent Labs  Lab 03/01/20 0628 03/02/20 0625 03/03/20 0331 03/04/20 0525 03/05/20 0749 03/07/20 0454  WBC 16.5* 14.7* 10.5 12.8* 10.9* 14.8*  NEUTROABS 13.3*  --  8.3* 9.0* 7.1  --   HGB 15.0 13.5 11.9* 11.6* 10.2* 10.0*  HCT 45.1 41.3 36.6 35.1* 31.9* 30.7*  MCV 94.0 95.4 96.8 96.2 97.6 95.9  PLT 141* 124* 103* 131* 148* 254   Basic Metabolic Panel: Recent Labs  Lab 03/02/20 0625 03/03/20 0331 03/03/20 2117 03/04/20 0525  03/05/20 0749 03/06/20 0527 03/07/20 0454  NA 141 141  --  138 135  --  138  K 4.1 3.4*  --  3.4* 3.6  --  2.8*  CL 111 112*  --  107 104  --  104  CO2 23 23  --  25 24  --  27  GLUCOSE 221* 267*  --  126* 139*  --  170*  BUN 24* 24*  --  18 15  --  9  CREATININE 1.21* 1.09*  --  1.16* 1.15*  --  1.06*  CALCIUM 8.3* 8.0*  --  8.1* 7.9*  --  7.9*  MG 2.0 1.9  --  2.0 2.1 2.3  --   PHOS 2.4* 1.5* 2.8 2.4* 2.4* 2.0* 2.1*   GFR: Estimated Creatinine Clearance: 47.2 mL/min (A) (by C-G formula based on SCr of 1.06 mg/dL (H)). Liver Function Tests: Recent Labs  Lab 03/01/20 0628 03/03/20 0331 03/04/20 0525 03/05/20 0749  AST 44* 42* 26 23  ALT 14 17 16 15   ALKPHOS 39 37* 50 68  BILITOT 1.5* 2.0* 1.5* 1.5*  PROT 5.3* 5.3* 5.7* 5.0*  ALBUMIN 2.9* 2.5* 2.5* 2.2*   No results for input(s): LIPASE, AMYLASE in the last 168 hours. No results for input(s): AMMONIA in the last 168 hours. Coagulation Profile: No results for input(s): INR, PROTIME in the last 168 hours. Cardiac Enzymes: No results for input(s): CKTOTAL, CKMB, CKMBINDEX, TROPONINI in the last 168 hours. BNP (last 3 results) No  results for input(s): PROBNP in the last 8760 hours. HbA1C: No results for input(s): HGBA1C in the last 72 hours. CBG: Recent Labs  Lab 03/05/20 2101 03/06/20 0801 03/06/20 1133 03/06/20 1558 03/06/20 2007  GLUCAP 78 98 195* 119* 94   Lipid Profile: No results for input(s): CHOL, HDL, LDLCALC, TRIG, CHOLHDL, LDLDIRECT in the last 72 hours. Thyroid Function Tests: No results for input(s): TSH, T4TOTAL, FREET4, T3FREE, THYROIDAB in the last 72 hours. Anemia Panel: No results for input(s): VITAMINB12, FOLATE, FERRITIN, TIBC, IRON, RETICCTPCT in the last 72 hours. Sepsis Labs: Recent Labs  Lab 02/29/20 0956 02/29/20 2148 02/29/20 2148 03/01/20 0628 03/02/20 0625 03/03/20 0331 03/04/20 0525  PROCALCITON  --  1.60   < > 1.55 2.03 1.78 1.32  LATICACIDVEN 9.4* 3.1*  --  1.9  --   --    --    < > = values in this interval not displayed.    Recent Results (from the past 240 hour(s))  Resp Panel by RT-PCR (Flu A&B, Covid) Nasopharyngeal Swab     Status: None   Collection Time: 02/29/20  2:53 AM   Specimen: Nasopharyngeal Swab; Nasopharyngeal(NP) swabs in vial transport medium  Result Value Ref Range Status   SARS Coronavirus 2 by RT PCR NEGATIVE NEGATIVE Final    Comment: (NOTE) SARS-CoV-2 target nucleic acids are NOT DETECTED.  The SARS-CoV-2 RNA is generally detectable in upper respiratory specimens during the acute phase of infection. The lowest concentration of SARS-CoV-2 viral copies this assay can detect is 138 copies/mL. A negative result does not preclude SARS-Cov-2 infection and should not be used as the sole basis for treatment or other patient management decisions. A negative result may occur with  improper specimen collection/handling, submission of specimen other than nasopharyngeal swab, presence of viral mutation(s) within the areas targeted by this assay, and inadequate number of viral copies(<138 copies/mL). A negative result must be combined with clinical observations, patient history, and epidemiological information. The expected result is Negative.  Fact Sheet for Patients:  BloggerCourse.com  Fact Sheet for Healthcare Providers:  SeriousBroker.it  This test is no t yet approved or cleared by the [place] FDA and  has been authorized for detection and/or diagnosis of SARS-CoV-2 by FDA under an Emergency Use Authorization (EUA). This EUA will remain  in effect (meaning this test can be used) for the duration of the COVID-19 declaration under Section 564(b)(1) of the Act, 21 U.S.C.section 360bbb-3(b)(1), unless the authorization is terminated  or revoked sooner.       Influenza A by PCR NEGATIVE NEGATIVE Final   Influenza B by PCR NEGATIVE NEGATIVE Final    Comment: (NOTE) The Xpert  Xpress SARS-CoV-2/FLU/RSV plus assay is intended as an aid in the diagnosis of influenza from Nasopharyngeal swab specimens and should not be used as a sole basis for treatment. Nasal washings and aspirates are unacceptable for Xpert Xpress SARS-CoV-2/FLU/RSV testing.  Fact Sheet for Patients: BloggerCourse.com  Fact Sheet for Healthcare Providers: SeriousBroker.it  This test is not yet approved or cleared by the [place] FDA and has been authorized for detection and/or diagnosis of SARS-CoV-2 by FDA under an Emergency Use Authorization (EUA). This EUA will remain in effect (meaning this test can be used) for the duration of the COVID-19 declaration under Section 564(b)(1) of the Act, 21 U.S.C. section 360bbb-3(b)(1), unless the authorization is terminated or revoked.  Performed at [facility], [address], [place], [place] [address]   Culture, blood (Routine X 2) w  Reflex to ID Panel     Status: None   Collection Time: 02/29/20  3:26 AM   Specimen: BLOOD  Result Value Ref Range Status   Specimen Description BLOOD RIGHT ANTECUBITAL  Final   Special Requests   Final    BOTTLES DRAWN AEROBIC AND ANAEROBIC Blood Culture adequate volume   Culture   Final    NO GROWTH 5 DAYS Performed at [facility], [address] [place], [place] [address]    Report Status 03/05/2020 FINAL  Final  Culture, blood (Routine X 2) w Reflex to ID Panel     Status: None   Collection Time: 02/29/20  3:43 AM   Specimen: BLOOD  Result Value Ref Range Status   Specimen Description BLOOD BLOOD RIGHT HAND  Final   Special Requests   Final    BOTTLES DRAWN AEROBIC AND ANAEROBIC Blood Culture results may not be optimal due to an inadequate volume of blood received in culture bottles   Culture   Final    NO GROWTH 5 DAYS Performed at [facility], [address] [place], [place] [address]    Report Status 03/05/2020  FINAL  Final  MRSA PCR Screening     Status: None   Collection Time: 02/29/20  9:45 AM   Specimen: Nasopharyngeal  Result Value Ref Range Status   MRSA by PCR NEGATIVE NEGATIVE Final    Comment:        The GeneXpert MRSA Assay (FDA approved for NASAL specimens only), is one component of a comprehensive MRSA colonization surveillance program. It is not intended to diagnose MRSA infection nor to guide or monitor treatment for MRSA infections. Performed at [facility], [address] [place], [place] [address]   Urine Culture     Status: None   Collection Time: 03/05/20  9:16 AM   Specimen: Urine, Catheterized  Result Value Ref Range Status   Specimen Description   Final    URINE, CATHETERIZED Performed at [facility], [address], [place], [place] [address]    Special Requests   Final    NONE Performed at [facility], [address] [place], [place] [address]    Culture   Final    NO GROWTH Performed at [facility], [address] [place], [place] [address]    Report Status 03/06/2020 FINAL  Final  CULTURE, BLOOD (ROUTINE X 2) w Reflex to ID Panel     Status: None (Preliminary result)   Collection Time: 03/05/20 10:02 AM   Specimen: BLOOD  Result Value Ref Range Status   Specimen Description BLOOD LEFT AC  Final   Special Requests   Final    BOTTLES DRAWN AEROBIC AND ANAEROBIC Blood Culture adequate volume   Culture   Final    NO GROWTH < 24 HOURS Performed at [facility], [address], [place], [place] [address]    Report Status PENDING  Incomplete  CULTURE, BLOOD (ROUTINE X 2) w Reflex to ID Panel     Status: None (Preliminary result)   Collection Time: 03/05/20 10:03 AM   Specimen: BLOOD  Result Value Ref Range Status   Specimen Description BLOOD [facility]  Final   Special Requests   Final    BOTTLES DRAWN AEROBIC AND ANAEROBIC Blood Culture adequate volume   Culture   Final    NO GROWTH < 24 HOURS Performed  at [facility], [address], [place], [place] [address]  Report Status PENDING  Incomplete         Radiology Studies: DG Abd 1 View  Result Date: 03/05/2020 CLINICAL DATA:  Abdominal distension and pain EXAM: ABDOMEN - 1 VIEW COMPARISON:  02/29/2020 FINDINGS: Scattered large and small bowel gas is noted. No obstructive changes are seen. No free air is noted. Degenerative changes of lumbar spine are seen. Diffuse aortic calcifications are noted. IMPRESSION: No acute abnormality noted. Electronically Signed   By: Alcide CleverMark  Lukens M.D.   On: 03/05/2020 13:16        Scheduled Meds: . feeding supplement  1 Container Oral TID BM  . fluticasone  2 spray Each Nare Daily  . heparin injection (subcutaneous)  5,000 Units Subcutaneous Q8H  . insulin aspart  0-20 Units Subcutaneous TID WC  . insulin aspart  0-5 Units Subcutaneous QHS  . insulin detemir  10 Units Subcutaneous Daily  . multivitamin with minerals  1 tablet Oral Daily  . pantoprazole (PROTONIX) IV  40 mg Intravenous Q24H   Continuous Infusions:   Assessment & Plan:   Active Problems:   Acute pancreatic necrosis   Seizure (HCC)   Necrotizing pancreatitis   Acute Necrotizing Pancreatitis -No role for surgery or perc drain at this time, there is not a discrete collection that would benefit -There is no obvious intervention required at the level of the gallbladder or duct -[facility] if come to that point -2/3-Monitor fever curve and trend WBC and procalcitonin  Procalcitonin is decreasing  Lactic acid normalized  Ethanol level less than 10, triglyceride 169  Right upper quadrant abdominal ultrasound on 2/26 without gallstones, acute cholecystitis, or biliary tract dilatation  3/3-GI consulted since pt febrile and has pain GIs input was appreciated-clear and apparently is improving.  If patient's symptoms increase or patient starts to date.  Recommend  repeating CT scan of the abdomen but for now hold off on CT scan. 3/4-GI signed off. No sign of pancreatic necrosis being infected per GI.  3/5- zosyn dc/'d yesterday as she has received it for few day . Had fever on it . Will observe and trend fever off abx. Cbc with leukocytosis, will trend abd xr with gas. Add simethicone daily since improves her sx of gas, distention    Fever- possibly atelectasis? Doubt pna.  Wbc up Zosyn dc'd on 4/4 Afebrile now Will continue, if has fever, will repeat ct scan abd     Acute Hypoxic Respiratory Failure in the setting of Atelectasis and Left Pleural Effusion -Supplemental O2 as needed to maintain O2 sats >92% -Follow intermittent CXR and ABG as needed -CXR on 2/28 with new LEFT pleural effusion with bibasilar atelectasis greater on LEFT 3/4 -pleural effusion due to pancreatitis Echo 02/23/2020 with LVEF 65 to 70%, grade 1 diastolic dysfunction Flutter valve and incentive spirometer Wean down O2 as tolerated to keep O2 sat above 92% 4/5-bnp elevated, mildly fluid overloaded. Will give lasix small dose 20mg  iv x1   Hypokalemia- K 2. 1 8 Will give po [id number].[id number] x3  Monitor levels   Acute Metabolic Encephalopathy>>resolved Generalized Tonic-Clonic Seizure Has first-time seizure in the ED.  This is provoked episodes in the setting of significant pancreatitis with lactic acidosis. 3/3-EEG -received a mild diffuse encephalopathy, nonspecific etiology.  Possibly toxic metabolic etiology.  No seizure or definite epileptiform discharges were seen  Per neurology keppra was d/c'd No indication for AED at this time Neurology signed off  Acute Kidney Injury>>improving Creatinine 1.06 Continue to monitor levels Encourage p.o. intake Ensure adequate renal perfusion Avoid nephrotoxic agents    Hyperglycemia BG levels stable now Continue to monitor Continue RISS and levemir Ck A1c      DVT prophylaxis: Heparin subcu Code  Status: DNR Family Communication: son at bedside  Status is: Inpatient  Remains inpatient appropriate because:Inpatient level of care appropriate due to severity of illness   Dispo: The patient is from: Home              Anticipated d/c is to: home with home health              Patient currently is not medically stable to d/c.   Difficult to place patient No  Likely dc in 2  days , still with pain, had fever , w/u pending, wbc up           LOS: 7 days   Time spent:  with more than 50% on COC    Lynn Ito, MD [facility] Pager [phone]  If 7PM-7AM, please contact night-coverage 03/07/2020, 8:27 AM

## 2020-03-08 ENCOUNTER — Inpatient Hospital Stay: Payer: Medicare HMO

## 2020-03-08 ENCOUNTER — Encounter: Payer: Self-pay | Admitting: Pulmonary Disease

## 2020-03-08 DIAGNOSIS — R569 Unspecified convulsions: Secondary | ICD-10-CM | POA: Diagnosis not present

## 2020-03-08 DIAGNOSIS — K8591 Acute pancreatitis with uninfected necrosis, unspecified: Secondary | ICD-10-CM | POA: Diagnosis not present

## 2020-03-08 DIAGNOSIS — R509 Fever, unspecified: Secondary | ICD-10-CM | POA: Diagnosis not present

## 2020-03-08 DIAGNOSIS — R739 Hyperglycemia, unspecified: Secondary | ICD-10-CM | POA: Diagnosis not present

## 2020-03-08 LAB — BASIC METABOLIC PANEL
Anion gap: 8 (ref 5–15)
BUN: 7 mg/dL — ABNORMAL LOW (ref 8–23)
CO2: 26 mmol/L (ref 22–32)
Calcium: 8.3 mg/dL — ABNORMAL LOW (ref 8.9–10.3)
Chloride: 104 mmol/L (ref 98–111)
Creatinine, Ser: 0.93 mg/dL (ref 0.44–1.00)
GFR, Estimated: >60 mL/min (ref >60)
Glucose, Bld: 139 mg/dL — ABNORMAL HIGH (ref 70–99)
Potassium: 3.6 mmol/L (ref 3.5–5.1)
Sodium: 138 mmol/L (ref 135–145)

## 2020-03-08 LAB — CBC
HCT: 30.4 % — ABNORMAL LOW (ref 36.0–46.0)
Hemoglobin: 10.2 g/dL — ABNORMAL LOW (ref 12.0–15.0)
MCH: 31.5 pg (ref 26.0–34.0)
MCHC: 33.6 g/dL (ref 30.0–36.0)
MCV: 93.8 fL (ref 80.0–100.0)
Platelets: 300 10*3/uL (ref 150–400)
RBC: 3.24 MIL/uL — ABNORMAL LOW (ref 3.87–5.11)
RDW: 13 % (ref 11.5–15.5)
WBC: 19.2 10*3/uL — ABNORMAL HIGH (ref 4.0–10.5)
nRBC: 0.1 % (ref 0.0–0.2)

## 2020-03-08 LAB — GLUCOSE, CAPILLARY
Glucose-Capillary: 152 mg/dL — ABNORMAL HIGH (ref 70–99)
Glucose-Capillary: 159 mg/dL — ABNORMAL HIGH (ref 70–99)
Glucose-Capillary: 86 mg/dL (ref 70–99)
Glucose-Capillary: 121 mg/dL — ABNORMAL HIGH (ref 70–99)

## 2020-03-08 MED ORDER — IOHEXOL 9 MG/ML PO SOLN
500.0000 mL | ORAL | Status: AC
  Administered 2020-03-08 (×2): 500 mL via ORAL

## 2020-03-08 MED ORDER — IOHEXOL 300 MG/ML  SOLN
100.0000 mL | Freq: Once | INTRAMUSCULAR | Status: AC | PRN
  Administered 2020-03-08: 100 mL via INTRAVENOUS

## 2020-03-08 MED ORDER — INSULIN DETEMIR 100 UNIT/ML ~~LOC~~ SOLN
6.0000 [IU] | Freq: Every day | SUBCUTANEOUS | Status: DC
  Administered 2020-03-09 – 2020-03-13 (×5): 6 [IU] via SUBCUTANEOUS
  Filled 2020-03-08 (×5): qty 0.06

## 2020-03-08 MED ORDER — FUROSEMIDE 10 MG/ML IJ SOLN
20.0000 mg | Freq: Once | INTRAMUSCULAR | Status: AC
  Administered 2020-03-08: 20 mg via INTRAVENOUS
  Filled 2020-03-08: qty 4

## 2020-03-08 NOTE — Plan of Care (Signed)

## 2020-03-08 NOTE — Progress Notes (Signed)
PROGRESS NOTE    Holly Navarro  ZOX:[id number] DOB: September 15, 1945 DOA: 02/29/2020 PCP: Tarri Fuller, FNP    Brief Narrative:  Holly Navarro a 75 y.o.femalewith history of hypertension, hyperlipidemia, gastric ulcer [facility] diffuse severe sharp abdominal [facility]. The discomfort began Around 10 PM last nightand was associated with onset of nausea and cramps.She did vomit and became diaphoretic after emesis. The pain did radiate across her entire abdomen and to her back. There was no associated diarrhea or lose stool and no hematochezia or hematemesis. She was found to be hypothermic and hypertensive upon her arrival she was warmed and started on fluids. Her evaluation returned a lactic acid of 6. There was a leukocytosis in excess of 20. A CT revealed necrotizing pancreatitis with a corroborating lipase. The case was discussed with our local surgeon who felt she may be best served in transfer. The case was then d/w with [facility] Intens[facility] who explained there were no beds and without acute intervention planned, she would likely need to wait and be stabilized at [facility]  She was admitted to the ICU service on 2/26.  On 3/2 she was transferred to [facility] service. 3/3-febrile Tmax 100.2, GI consulted this am 3/4-spiked fever again tmax 100.5.  She was complaining of abdominal distention but improved with simethicone. Eating food today and tolerating 3/5-c/o gas pain again asking for simethicone which helped her before. Tolerating po intake 3/6-her abdominal extension improved with simethicone however still with fullness and abdominal pain.  Lasix 20 mg iv  given yesterday per nursing requiring less O2 and doing better today    Consultants:   PCCM, neurology  Procedures:   2/26: CXR>>The lungs are symmetrically well expanded. Benign calcified granuloma within the right mid lung zone. Nodular density at the left lung base likely represents a  confluence of vascular and osseous shadow. The lungs are otherwise clear. No pneumothorax or pleural effusion. Cardiac size within normal limits. No acute bone Abnormality. 2/26: KUB>>Normal abdominal gas pattern. Inferior pelvis excluded from view. No definite gross free intraperitoneal gas. No organomegaly. Vascular calcifications are seen within the abdominal aorta. Osseous structures are age-appropriate. 2/26: CT Abdomen & Pelvis>>1. Severe Acute Pancreatitis, with subtotal associated Pancreatic Necrosis. 2. Moderate volume free fluid and inflammation throughout the upper abdomen. No organized or rim enhancing fluid collection at this time. No vascular occlusion or other complicating features. 3. Infrarenal abdominal aortic aneurysm measuring 31 mm. Recommend follow-up Ultrasound every 3 years. This recommendation follows ACR consensus guidelines: White Paper of the ACR Incidental Findings Committee II on Vascular Findings. J Am Coll Radiol 2013; 81:191-478. 4. Aortic Atherosclerosis (ICD10-I70.0) and Emphysema (ICD10-J43.9). 2/26: CT Head>>No acute intracranial abnormality. Normal examination. 2/26: US Abdomen (RUQ)>>1. Trace volume of ascites adjacent to the liver. 2. Otherwise, unremarkable examination. Specifically, no gallstones, signs of acute cholecystitis, or evidence of biliary tract Dilatation. 2/28: Echocardiogram>>1. Left ventricular ejection fraction, by estimation, is 65 to 70%. The  left ventricle has normal function. The left ventricle has no regional  wall motion abnormalities. Left ventricular diastolic parameters are  consistent with Grade I diastolic  dysfunction (impaired relaxation).  2. Right ventricular systolic function is normal. The right ventricular  size is normal. Tricuspid regurgitation signal is inadequate for assessing  PA pressure.  3. The mitral valve is normal in structure. No evidence of mitral valve  regurgitation. No evidence of mitral  stenosis.  4. The aortic valve is normal in structure. Aortic valve regurgitation is  not visualized. Mild aortic valve stenosis. Aortic valve area, by VTI  measures 2.07 cm. Aortic valve mean gradient measures 6.0 mmHg.  5. The inferior vena cava is normal in size with greater than 50%  respiratory variability, suggesting right atrial pressure of 3 mmHg 2/28:CXR>>Normal heart size, mediastinal contours, and pulmonary vascularity. LEFT pleural effusion new since previous exam with LEFT basilar atelectasis.Subsegmental atelectasis at RIGHT base as well. Upper lungs clear. No pneumothorax. Bones demineralized.     Antimicrobials:  2/26: SARS-CoV-2 PCR>> negative 2/26: Influenza A&B PCR>> negative 2/26: Blood culture x2>> no growth to date 2/26: MRSA PCR>> negative  Cefepime 2/26>> 2/27 Flagyl 2/26>> 2/27 Zosyn 2/27>>  Subjective: Still with mild left lower quadrant and epigastric pain. States not sure if sob, but just cant "expand her lungs"  Objective: Vitals:   03/07/20 1634 03/07/20 1955 03/08/20 0619 03/08/20 0728  BP: 130/66 113/64 (!) 148/72 119/70  Pulse: (!) 102 (!) 103 97 (!) 103  Resp: 16 20 20 16   Temp: 99.8 F (37.7 C) 98.9 F (37.2 C) 98.4 F (36.9 C) 99.4 F (37.4 C)  TempSrc: Oral Oral Oral   SpO2: 96% 100% 96% 90%  Weight:      Height:        Intake/Output Summary (Last 24 hours) at 03/08/2020 1127 Last data filed at 03/08/2020 1010 Gross per 24 hour  Intake 240 ml  Output 2101 ml  Net -1861 ml   Filed Weights   03/01/20 0500 03/05/20 0415 03/07/20 0502  Weight: 70.9 kg 74.3 kg 71.5 kg    Examination: Calm, nad Decrease bs at bases, no w/r Regular s1/s2 no gallop Soft, less distended positive bowel sounds, mild TTP at epigastric No edema Alert oriented x3   Data Reviewed: I have personally reviewed following labs and imaging studies  CBC: Recent Labs  Lab 03/03/20 0331 03/04/20 0525 03/05/20 0749 03/07/20 0454 03/08/20 0445  WBC  10.5 12.8* 10.9* 14.8* 19.2*  NEUTROABS 8.3* 9.0* 7.1  --   --   HGB 11.9* 11.6* 10.2* 10.0* 10.2*  HCT 36.6 35.1* 31.9* 30.7* 30.4*  MCV 96.8 96.2 97.6 95.9 93.8  PLT 103* 131* 148* 254 300   Basic Metabolic Panel: Recent Labs  Lab 03/02/20 0625 03/03/20 0331 03/03/20 2117 03/04/20 0525 03/05/20 0749 03/06/20 0527 03/07/20 0454 03/08/20 0445  NA 141 141  --  138 135  --  138 138  K 4.1 3.4*  --  3.4* 3.6  --  2.8* 3.6  CL 111 112*  --  107 104  --  104 104  CO2 23 23  --  25 24  --  27 26  GLUCOSE 221* 267*  --  126* 139*  --  170* 139*  BUN 24* 24*  --  18 15  --  9 7*  CREATININE 1.21* 1.09*  --  1.16* 1.15*  --  1.06* 0.93  CALCIUM 8.3* 8.0*  --  8.1* 7.9*  --  7.9* 8.3*  MG 2.0 1.9  --  2.0 2.1 2.3  --   --   PHOS 2.4* 1.5* 2.8 2.4* 2.4* 2.0* 2.1*  --    GFR: Estimated Creatinine Clearance: 53.8 mL/min (by C-G formula based on SCr of 0.93 mg/dL). Liver Function Tests: Recent Labs  Lab 03/03/20 0331 03/04/20 0525 03/05/20 0749  AST 42* 26 23  ALT 17 16 15   ALKPHOS 37* 50 68  BILITOT 2.0* 1.5* 1.5*  PROT 5.3* 5.7* 5.0*  ALBUMIN 2.5* 2.5* 2.2*  No results for input(s): LIPASE, AMYLASE in the last 168 hours. No results for input(s): AMMONIA in the last 168 hours. Coagulation Profile: No results for input(s): INR, PROTIME in the last 168 hours. Cardiac Enzymes: No results for input(s): CKTOTAL, CKMB, CKMBINDEX, TROPONINI in the last 168 hours. BNP (last 3 results) No results for input(s): PROBNP in the last 8760 hours. HbA1C: No results for input(s): HGBA1C in the last 72 hours. CBG: Recent Labs  Lab 03/07/20 1132 03/07/20 1626 03/07/20 1651 03/07/20 2135 03/08/20 0754  GLUCAP 204* 63* 86 109* 152*   Lipid Profile: No results for input(s): CHOL, HDL, LDLCALC, TRIG, CHOLHDL, LDLDIRECT in the last 72 hours. Thyroid Function Tests: No results for input(s): TSH, T4TOTAL, FREET4, T3FREE, THYROIDAB in the last 72 hours. Anemia Panel: No results for  input(s): VITAMINB12, FOLATE, FERRITIN, TIBC, IRON, RETICCTPCT in the last 72 hours. Sepsis Labs: Recent Labs  Lab 03/02/20 0625 03/03/20 0331 03/04/20 0525  PROCALCITON 2.03 1.78 1.32    Recent Results (from the past 240 hour(s))  Resp Panel by RT-PCR (Flu A&B, Covid) Nasopharyngeal Swab     Status: None   Collection Time: 02/29/20  2:53 AM   Specimen: Nasopharyngeal Swab; Nasopharyngeal(NP) swabs in vial transport medium  Result Value Ref Range Status   SARS Coronavirus 2 by RT PCR NEGATIVE NEGATIVE Final    Comment: (NOTE) SARS-CoV-2 target nucleic acids are NOT DETECTED.  The SARS-CoV-2 RNA is generally detectable in upper respiratory specimens during the acute phase of infection. The lowest concentration of SARS-CoV-2 viral copies this assay can detect is 138 copies/mL. A negative result does not preclude SARS-Cov-2 infection and should not be used as the sole basis for treatment or other patient management decisions. A negative result may occur with  improper specimen collection/handling, submission of specimen other than nasopharyngeal swab, presence of viral mutation(s) within the areas targeted by this assay, and inadequate number of viral copies(<138 copies/mL). A negative result must be combined with clinical observations, patient history, and epidemiological information. The expected result is Negative.  Fact Sheet for Patients:  BloggerCourse.com  Fact Sheet for Healthcare Providers:  SeriousBroker.it  This test is no t yet approved or cleared by the [place] FDA and  has been authorized for detection and/or diagnosis of SARS-CoV-2 by FDA under an Emergency Use Authorization (EUA). This EUA will remain  in effect (meaning this test can be used) for the duration of the COVID-19 declaration under Section 564(b)(1) of the Act, 21 U.S.C.section 360bbb-3(b)(1), unless the authorization is terminated  or revoked  sooner.       Influenza A by PCR NEGATIVE NEGATIVE Final   Influenza B by PCR NEGATIVE NEGATIVE Final    Comment: (NOTE) The Xpert Xpress SARS-CoV-2/FLU/RSV plus assay is intended as an aid in the diagnosis of influenza from Nasopharyngeal swab specimens and should not be used as a sole basis for treatment. Nasal washings and aspirates are unacceptable for Xpert Xpress SARS-CoV-2/FLU/RSV testing.  Fact Sheet for Patients: BloggerCourse.com  Fact Sheet for Healthcare Providers: SeriousBroker.it  This test is not yet approved or cleared by the [place] FDA and has been authorized for detection and/or diagnosis of SARS-CoV-2 by FDA under an Emergency Use Authorization (EUA). This EUA will remain in effect (meaning this test can be used) for the duration of the COVID-19 declaration under Section 564(b)(1) of the Act, 21 U.S.C. section 360bbb-3(b)(1), unless the authorization is terminated or revoked.  Performed at [facility], [address], [place], [place]  [address]   Culture, blood (Routine X 2) w Reflex to ID Panel     Status: None   Collection Time: 02/29/20  3:26 AM   Specimen: BLOOD  Result Value Ref Range Status   Specimen Description BLOOD RIGHT ANTECUBITAL  Final   Special Requests   Final    BOTTLES DRAWN AEROBIC AND ANAEROBIC Blood Culture adequate volume   Culture   Final    NO GROWTH 5 DAYS Performed at [facility], [address] [place], [place] [address]    Report Status 03/05/2020 FINAL  Final  Culture, blood (Routine X 2) w Reflex to ID Panel     Status: None   Collection Time: 02/29/20  3:43 AM   Specimen: BLOOD  Result Value Ref Range Status   Specimen Description BLOOD BLOOD RIGHT HAND  Final   Special Requests   Final    BOTTLES DRAWN AEROBIC AND ANAEROBIC Blood Culture results may not be optimal due to an inadequate volume of blood received in culture bottles   Culture    Final    NO GROWTH 5 DAYS Performed at [facility], [address] [place], [place] [address]    Report Status 03/05/2020 FINAL  Final  MRSA PCR Screening     Status: None   Collection Time: 02/29/20  9:45 AM   Specimen: Nasopharyngeal  Result Value Ref Range Status   MRSA by PCR NEGATIVE NEGATIVE Final    Comment:        The GeneXpert MRSA Assay (FDA approved for NASAL specimens only), is one component of a comprehensive MRSA colonization surveillance program. It is not intended to diagnose MRSA infection nor to guide or monitor treatment for MRSA infections. Performed at [facility], [address] [place], [place] [address]   Urine Culture     Status: None   Collection Time: 03/05/20  9:16 AM   Specimen: Urine, Catheterized  Result Value Ref Range Status   Specimen Description   Final    URINE, CATHETERIZED Performed at [facility], [address], [place], [place] [address]    Special Requests   Final    NONE Performed at [facility], [address], [place], [place] [address]    Culture   Final    NO GROWTH Performed at [facility], [address], [place], [place] [address]    Report Status 03/06/2020 FINAL  Final  CULTURE, BLOOD (ROUTINE X 2) w Reflex to ID Panel     Status: None (Preliminary result)   Collection Time: 03/05/20 10:02 AM   Specimen: BLOOD  Result Value Ref Range Status   Specimen Description BLOOD LEFT [facility]  Final   Special Requests   Final    BOTTLES DRAWN AEROBIC AND ANAEROBIC Blood Culture adequate volume   Culture   Final    NO GROWTH 2 DAYS Performed at [facility], [address], [place], [place] [address]    Report Status PENDING  Incomplete  CULTURE, BLOOD (ROUTINE X 2) w Reflex to ID Panel     Status: None (Preliminary result)   Collection Time: 03/05/20 10:03 AM   Specimen: BLOOD  Result Value Ref Range Status   Specimen Description BLOOD [facility]  Final   Special  Requests   Final    BOTTLES DRAWN AEROBIC AND ANAEROBIC Blood Culture adequate volume   Culture   Final    NO GROWTH 2 DAYS Performed at [facility], [address] [place], [place] [address]    Report Status PENDING  Incomplete         Radiology Studies: No results found.      Scheduled Meds: . feeding supplement  1 Container Oral TID BM  . fluticasone  2 spray Each Nare Daily  . heparin injection (subcutaneous)  5,000 Units Subcutaneous Q8H  . insulin aspart  0-20 Units Subcutaneous TID WC  . insulin aspart  0-5 Units Subcutaneous QHS  . insulin detemir  10 Units Subcutaneous Daily  . iohexol  500 mL Oral Q1 Hr x 2  . multivitamin with minerals  1 tablet Oral Daily  . pantoprazole (PROTONIX) IV  40 mg Intravenous Q24H  . potassium chloride  40 mEq Oral TID  . simethicone  80 mg Oral Daily   Continuous Infusions:   Assessment & Plan:   Active Problems:   Acute pancreatic necrosis   Seizure (HCC)   Necrotizing pancreatitis   Acute Necrotizing Pancreatitis -No role for surgery or perc drain at this time, there is not a discrete collection that would benefit -There is no obvious intervention required at the level of the gallbladder or duct -Will maintain contact with [facility] for potential need of IR if come to that point -2/3-Monitor fever curve and trend WBC and procalcitonin  Procalcitonin is decreasing  Lactic acid normalized  Ethanol level less than 10, triglyceride 169  Right upper quadrant abdominal ultrasound on 2/26 without gallstones, acute cholecystitis, or biliary tract dilatation  3/3-GI consulted since pt febrile and has pain GIs input was appreciated-clear and apparently is improving.  If patient's symptoms increase or patient starts to date.  Recommend repeating CT scan of the abdomen but for now hold off on CT scan. 3/4-GI signed off. No sign of pancreatic necrosis being infected per GI.  3/5- zosyn dc/'d yesterday as she has  received it for few day . Had fever on it . Will observe and trend fever off abx. 3/6-afebrile, but leukocytosis trending up at 19.2 Will obtain ct chest ab/pel.  With abd pain and trending up of wbc.      Fever- possibly atelectasis? Doubt pna.  Wbc up Zosyn dc'd on 4/4 3/6-afebrile, but continues to have cbc trending up Off abx bcx neg to date, will f/u Ck ct chest/abd/pelvis Will continue, if has fever, will repeat ct scan abd     Acute Hypoxic Respiratory Failure in the setting of Atelectasis and Left Pleural Effusion -Supplemental O2 as needed to maintain O2 sats >92% -Follow intermittent CXR and ABG as needed -CXR on 2/28 with new LEFT pleural effusion with bibasilar atelectasis greater on LEFT 3/4 -pleural effusion due to pancreatitis Echo 02/23/2020 with LVEF 65 to 70%, grade 1 diastolic dysfunction Flutter valve and incentive spirometer Wean down O2 as tolerated to keep O2 sat above 92% 3/6-mildly still fluid overloaded bnp was elevated and reponded well to lasix yesterday o2 requirment down Will give another lasix  iv x1 today Monitor I/o    Hypokalemia-  Replaced and stable. monitor   Acute Metabolic Encephalopathy>>resolved Generalized Tonic-Clonic Seizure Has first-time seizure in the ED.  This is provoked episodes in the setting of significant pancreatitis with lactic acidosis. EEG -received a mild diffuse encephalopathy, nonspecific etiology.  Possibly toxic metabolic etiology.  No seizure or definite epileptiform discharges were seen  Per neurology keppra was d/c'd No indication for AED at this time Neurology signed off      Acute Kidney Injury Resolved Continue to monitor levels  Encourage p.o. intake Ensure adequate renal perfusion Avoid nephrotoxic agents     Hyperglycemia A1c 5.8 BG levels stable with episode of hypoglycemia Will decrease lantus to 6 units from 10. riss Continue to monitor      DVT prophylaxis:  Heparin subcu Code Status: DNR Family Communication: none at bedside  Status is: Inpatient  Remains inpatient appropriate because:Inpatient level of care appropriate due to severity of illness   Dispo: The patient is from: Home              Anticipated d/c is to: home with home health              Patient currently is not medically stable to d/c.   Difficult to place patient No  Likely dc in 2  days , still with pain,  w/u pending, wbc up           LOS: 8 days   Time spent: 35min  with more than 50% on COC    Lynn ItoSahar Amery, MD [facility] Pager [phone]  If 7PM-7AM, please contact night-coverage 03/08/2020, 11:27 AM

## 2020-03-09 ENCOUNTER — Inpatient Hospital Stay: Payer: Medicare HMO

## 2020-03-09 DIAGNOSIS — I714 Abdominal aortic aneurysm, without rupture: Secondary | ICD-10-CM

## 2020-03-09 DIAGNOSIS — K8591 Acute pancreatitis with uninfected necrosis, unspecified: Secondary | ICD-10-CM | POA: Diagnosis not present

## 2020-03-09 DIAGNOSIS — K8511 Biliary acute pancreatitis with uninfected necrosis: Secondary | ICD-10-CM

## 2020-03-09 DIAGNOSIS — E876 Hypokalemia: Secondary | ICD-10-CM

## 2020-03-09 DIAGNOSIS — I480 Paroxysmal atrial fibrillation: Secondary | ICD-10-CM

## 2020-03-09 DIAGNOSIS — R911 Solitary pulmonary nodule: Secondary | ICD-10-CM | POA: Diagnosis not present

## 2020-03-09 LAB — CBC
HCT: 31.6 % — ABNORMAL LOW (ref 36.0–46.0)
Hemoglobin: 10.6 g/dL — ABNORMAL LOW (ref 12.0–15.0)
MCH: 31.4 pg (ref 26.0–34.0)
MCHC: 33.5 g/dL (ref 30.0–36.0)
MCV: 93.5 fL (ref 80.0–100.0)
Platelets: 439 10*3/uL — ABNORMAL HIGH (ref 150–400)
RBC: 3.38 MIL/uL — ABNORMAL LOW (ref 3.87–5.11)
RDW: 13.1 % (ref 11.5–15.5)
WBC: 23.8 10*3/uL — ABNORMAL HIGH (ref 4.0–10.5)
nRBC: 0 % (ref 0.0–0.2)

## 2020-03-09 LAB — RENAL FUNCTION PANEL
Albumin: 2.5 g/dL — ABNORMAL LOW (ref 3.5–5.0)
Anion gap: 10 (ref 5–15)
BUN: 8 mg/dL (ref 8–23)
CO2: 25 mmol/L (ref 22–32)
Calcium: 8.3 mg/dL — ABNORMAL LOW (ref 8.9–10.3)
Chloride: 101 mmol/L (ref 98–111)
Creatinine, Ser: 1.06 mg/dL — ABNORMAL HIGH (ref 0.44–1.00)
GFR, Estimated: 55 mL/min — ABNORMAL LOW (ref >60)
Glucose, Bld: 140 mg/dL — ABNORMAL HIGH (ref 70–99)
Phosphorus: 3.2 mg/dL (ref 2.5–4.6)
Potassium: 3.4 mmol/L — ABNORMAL LOW (ref 3.5–5.1)
Sodium: 136 mmol/L (ref 135–145)

## 2020-03-09 LAB — GLUCOSE, CAPILLARY
Glucose-Capillary: 174 mg/dL — ABNORMAL HIGH (ref 70–99)
Glucose-Capillary: 131 mg/dL — ABNORMAL HIGH (ref 70–99)
Glucose-Capillary: 168 mg/dL — ABNORMAL HIGH (ref 70–99)
Glucose-Capillary: 122 mg/dL — ABNORMAL HIGH (ref 70–99)

## 2020-03-09 MED ORDER — RISAQUAD PO CAPS
1.0000 | ORAL_CAPSULE | Freq: Every day | ORAL | Status: DC
  Administered 2020-03-09 – 2020-03-14 (×4): 1 via ORAL
  Filled 2020-03-09 (×6): qty 1

## 2020-03-09 MED ORDER — SENNOSIDES-DOCUSATE SODIUM 8.6-50 MG PO TABS
1.0000 | ORAL_TABLET | Freq: Every day | ORAL | Status: DC
  Filled 2020-03-09: qty 1

## 2020-03-09 MED ORDER — POLYETHYLENE GLYCOL 3350 17 G PO PACK
17.0000 g | PACK | Freq: Two times a day (BID) | ORAL | Status: DC
  Filled 2020-03-09: qty 1

## 2020-03-09 MED ORDER — SENNOSIDES-DOCUSATE SODIUM 8.6-50 MG PO TABS: 1.0000 | ORAL_TABLET | Freq: Every evening | ORAL | Status: DC | PRN

## 2020-03-09 MED ORDER — DEXTROSE-NACL 5-0.45 % IV SOLN: INTRAVENOUS | Status: DC

## 2020-03-09 MED ORDER — PIPERACILLIN-TAZOBACTAM 3.375 G IVPB: 3.3750 g | Freq: Three times a day (TID) | INTRAVENOUS | Status: DC

## 2020-03-09 MED ORDER — SODIUM CHLORIDE 0.9 % IV SOLN
1.0000 g | Freq: Two times a day (BID) | INTRAVENOUS | Status: DC
  Administered 2020-03-09 – 2020-03-13 (×9): 1 g via INTRAVENOUS
  Filled 2020-03-09 (×11): qty 1

## 2020-03-09 MED ORDER — POLYETHYLENE GLYCOL 3350 17 G PO PACK: 17.0000 g | PACK | Freq: Every day | ORAL | Status: DC | PRN

## 2020-03-09 MED ORDER — POTASSIUM CHLORIDE CRYS ER 20 MEQ PO TBCR
40.0000 meq | EXTENDED_RELEASE_TABLET | Freq: Once | ORAL | Status: AC
  Administered 2020-03-09: 40 meq via ORAL
  Filled 2020-03-09: qty 2

## 2020-03-09 NOTE — Plan of Care (Signed)
Patient still having quite a bit of abdominal pain.  Plan to make NPO and provide nutrition either via [place] or by IV.

## 2020-03-09 NOTE — Care Management Important Message (Signed)
Important Message  Patient Details  Name: Holly Navarro MRN: [id number] Date of Birth: [birth year]   Medicare Important Message Given:  Yes     [name] 03/09/2020, 12:18 PM

## 2020-03-09 NOTE — Consult Note (Signed)
NAME: Holly Navarro  DOB: Mar 31, 1945  MRN: [id number]  Date/Time: 03/09/2020 4:18 PM  REQUESTING PROVIDER: Dr.Amery Subjective:  REASON FOR CONSULT: pancreatitis ? Holly Navarro is a 75 y.o. female with a history of Hyperlipidemia , afib admitted on 02/29/20 with severe abdominal pain and N/V- In the ED vitals were 141/73, Tmp 98.4, HR 101, RR 30 WBC 28.5, Hb 17, PLT 184 and cr 1.30 CT abdomen revealed Severe Acute Pancreatitis, with subtotal associated Pancreatic Necrosis. 2. Moderate volume free fluid and inflammation throughout the upper abdomen. No organized or rim enhancing fluid collection . Was admitted to ICU and as hemodynamically stable was not started on antibiotics. [facility] transfer was considered but no bed was available She received a dose of cefepime, metornidazole Because of worsening leucocytosis she was switched to zosyn on from 2/27-3/4- she was doing fine and started on Po clear liquids. WBC dropped to 10.9 on 3/3 and zosyn was discontinued.  I am asked to see patient as the wbc is going up again- a repeat CT abdomen was done on 03/08/20 Revisualization of necrotic pancreatitis with exuberant adjacent fat stranding and scattered free fluid throughout the abdomen. No focal drainable fluid collection or pseudoaneurysm at this point in time.  Pt was started on meropenem today She is comfortabe in bed- No pain abdomen., nausea and vomiting.   Past Medical History:  Diagnosis Date  . Anxiety   . Arthritis   . Cardiac arrhythmia   . Hyperlipidemia   . Stomach ulcer     Past Surgical History:  Procedure Laterality Date  . CYSTECTOMY      Social History   Socioeconomic History  . Marital status: Married    Spouse name: Not on file  . Number of children: Not on file  . Years of education: Not on file  . Highest education level: Not on file  Occupational History  . Not on file  Tobacco Use  . Smoking status: Current Every Day Smoker    Packs/day: 0.50    Types:  Cigarettes  . Smokeless tobacco: Never Used  Vaping Use  . Vaping Use: Never used  Substance and Sexual Activity  . Alcohol use: Yes    Alcohol/week: 0.0 standard drinks    Comment: occasionally   . Drug use: No  . Sexual activity: Not on file  Other Topics Concern  . Not on file  Social History Narrative  . Not on file   Social Determinants of Health   Financial Resource Strain: Not on file  Food Insecurity: Not on file  Transportation Needs: Not on file  Physical Activity: Not on file  Stress: Not on file  Social Connections: Not on file  Intimate Partner Violence: Not on file    Family History  Problem Relation Age of Onset  . Heart disease Mother   . Kidney disease Mother   . Diabetes Father   . Cancer Brother        pencreatic   No Known Allergies I? Current Facility-Administered Medications  Medication Dose Route Frequency Provider Last Rate Last Admin  . acidophilus (RISAQUAD) capsule 1 capsule  1 capsule Oral Daily Lynn Ito, MD   1 capsule at 03/09/20 0916  . dextrose 5 %-0.45 % sodium chloride infusion   Intravenous Continuous Lynn Ito, MD 50 mL/hr at 03/09/20 1544 Infusion Verify at 03/09/20 1544  . feeding supplement (BOOST / RESOURCE BREEZE) liquid 1 Container  1 Container Oral TID BM Erin Fulling, MD   1 Container  at 03/08/20 1321  . fluticasone (FLONASE) 50 MCG/ACT nasal spray 2 spray  2 spray Each Nare Daily Mansy, Jan A, MD   2 spray at 03/09/20 0922  . heparin injection 5,000 Units  5,000 Units Subcutaneous Q8H Erin Fulling, MD   5,000 Units at 03/09/20 1245  . HYDROcodone-acetaminophen (NORCO/VICODIN) 5-325 MG per tablet 1-2 tablet  1-2 tablet Oral Q6H PRN Judithe Modest, NP   1 tablet at 03/09/20 0931  . HYDROmorphone (DILAUDID) injection 0.5 mg  0.5 mg Intravenous Q2H PRN Erin Fulling, MD   0.5 mg at 03/05/20 0109  . insulin aspart (novoLOG) injection 0-20 Units  0-20 Units Subcutaneous TID WC Harlon Ditty D, NP   3 Units at 03/09/20 1245   . insulin aspart (novoLOG) injection 0-5 Units  0-5 Units Subcutaneous QHS Harlon Ditty D, NP      . insulin detemir (LEVEMIR) injection 6 Units  6 Units Subcutaneous Daily Lynn Ito, MD   6 Units at 03/09/20 0920  . meropenem (MERREM) 1 g in sodium chloride 0.9 % 100 mL IVPB  1 g Intravenous Q12H Lynn Ito, MD   Stopped at 03/09/20 [phone]  . multivitamin with minerals tablet 1 tablet  1 tablet Oral Daily Erin Fulling, MD   1 tablet at 03/09/20 0916  . pantoprazole (PROTONIX) injection 40 mg  40 mg Intravenous Q24H Rust-Chester, Britton L, NP   40 mg at 03/06/20 2143  . polyethylene glycol (MIRALAX / GLYCOLAX) packet 17 g  17 g Oral Daily PRN Lynn Ito, MD      . senna-docusate (Senokot-S) tablet 1 tablet  1 tablet Oral QHS PRN Lynn Ito, MD      . simethicone (MYLICON) chewable tablet 80 mg  80 mg Oral Daily Lynn Ito, MD   80 mg at 03/09/20 0549     Abtx:  Anti-infectives (From admission, onward)   Start     Dose/Rate Route Frequency Ordered Stop   03/09/20 0915  piperacillin-tazobactam (ZOSYN) IVPB 3.375 g  Status:  Discontinued       Note to Pharmacy: Dosing per phamarcy   3.375 g 12.5 mL/hr over 240 Minutes Intravenous Every 8 hours 03/09/20 0816 03/09/20 0823   03/09/20 0915  meropenem (MERREM) 1 g in sodium chloride 0.9 % 100 mL IVPB       Note to Pharmacy: Dosing per pharmacy   1 g 200 mL/hr over 30 Minutes Intravenous Every 12 hours 03/09/20 0823     03/01/20 2000  piperacillin-tazobactam (ZOSYN) IVPB 3.375 g  Status:  Discontinued        3.375 g 12.5 mL/hr over 240 Minutes Intravenous Every 8 hours 03/01/20 1544 03/06/20 1407   03/01/20 0100  metroNIDAZOLE (FLAGYL) IVPB 500 mg  Status:  Discontinued        500 mg 100 mL/hr over 60 Minutes Intravenous Every 8 hours 03/01/20 0003 03/01/20 1543   03/01/20 0100  ceFEPIme (MAXIPIME) 2 g in sodium chloride 0.9 % 100 mL IVPB  Status:  Discontinued        2 g 200 mL/hr over 30 Minutes Intravenous Every 12 hours  03/01/20 0013 03/01/20 1543   02/29/20 0330  ceFEPIme (MAXIPIME) 2 g in sodium chloride 0.9 % 100 mL IVPB        2 g 200 mL/hr over 30 Minutes Intravenous  Once 02/29/20 0319 02/29/20 0435   02/29/20 0330  metroNIDAZOLE (FLAGYL) IVPB 500 mg        500 mg 100 mL/hr over 60 Minutes  Intravenous  Once 02/29/20 0319 02/29/20 0505      REVIEW OF SYSTEMS:  Const: negative fever, negative chills, negative weight loss Eyes: negative diplopia or visual changes, negative eye pain ENT: negative coryza, negative sore throat Resp: negative cough, hemoptysis, dyspnea Cards: negative for chest pain, palpitations, lower extremity edema GU: negative for frequency, dysuria and hematuria GI: Negative for abdominal pain, diarrhea, bleeding, constipation Skin: negative for rash and pruritus Heme: negative for easy bruising and gum/nose bleeding MS: negative for myalgias, arthralgias, back pain and muscle weakness Neurolo:negative for headaches, dizziness, vertigo, memory problems  Psych: negative for feelings of anxiety, depression  Endocrine:  diabetes Allergy/Immunology- negative for any medication or food allergies ?  Objective:  VITALS:  BP (!) 132/57 (BP Location: Right Arm)   Pulse (!) 110   Temp 98 F (36.7 C)   Resp 20   Ht 5\' 6"  (1.676 m)   Wt 70.2 kg   SpO2 94%   BMI 24.97 kg/m  PHYSICAL EXAM:  General: Alert, cooperative, no distress, appears stated age. pale Head: Normocephalic, without obvious abnormality, atraumatic. Eyes: Conjunctivae clear, anicteric sclerae. Pupils are equal ENT Nares normal. No drainage or sinus tenderness. Lips, mucosa, and tongue normal. No Thrush Neck: Supple, symmetrical, no adenopathy, thyroid: non tender no carotid bruit and no JVD. Lungs: Clear to auscultation bilaterally. No Wheezing or Rhonchi. No rales. Heart: Regular rate and rhythm, no murmur, rub or gallop. Abdomen: Soft,  distended. Bowel sounds normal. No masses Extremities: atraumatic, no  cyanosis. No edema. No clubbing Skin: No rashes or lesions. Or bruising Lymph: Cervical, supraclavicular normal. Neurologic: Grossly non-focal Pertinent Labs Lab Results CBC    Component Value Date/Time   WBC 23.8 (H) 03/09/2020 0449   RBC 3.38 (L) 03/09/2020 0449   HGB 10.6 (L) 03/09/2020 0449   HGB 14.8 10/22/2014 1304   HCT 31.6 (L) 03/09/2020 0449   HCT 43.2 10/22/2014 1304   PLT 439 (H) 03/09/2020 0449   PLT 267 10/22/2014 1304   MCV 93.5 03/09/2020 0449   MCV 94 10/22/2014 1304   MCH 31.4 03/09/2020 0449   MCHC 33.5 03/09/2020 0449   RDW 13.1 03/09/2020 0449   RDW 13.5 10/22/2014 1304   LYMPHSABS 1.5 03/05/2020 0749   LYMPHSABS 3.7 (H) 10/22/2014 1304   MONOABS 1.8 (H) 03/05/2020 0749   EOSABS 0.1 03/05/2020 0749   EOSABS 0.2 10/22/2014 1304   BASOSABS 0.1 03/05/2020 0749   BASOSABS 0.1 10/22/2014 1304    CMP Latest Ref Rng & Units 03/09/2020 03/08/2020 03/07/2020  Glucose 70 - 99 mg/dL 05/07/2020) 638(V) 564(P)  BUN 8 - 23 mg/dL 8 7(L) 9  Creatinine 329(J - 1.00 mg/dL 1.88) 4.16(S 0.63)  Sodium 135 - 145 mmol/L 136 138 138  Potassium 3.5 - 5.1 mmol/L 3.4(L) 3.6 2.8(L)  Chloride 98 - 111 mmol/L 101 104 104  CO2 22 - 32 mmol/L 25 26 27   Calcium 8.9 - 10.3 mg/dL 8.3(L) 8.3(L) 7.9(L)  Total Protein 6.5 - 8.1 g/dL - - -  Total Bilirubin 0.3 - 1.2 mg/dL - - -  Alkaline Phos 38 - 126 U/L - - -  AST 15 - 41 U/L - - -  ALT 0 - 44 U/L - - -      Microbiology: Recent Results (from the past 240 hour(s))  Resp Panel by RT-PCR (Flu A&B, Covid) Nasopharyngeal Swab     Status: None   Collection Time: 02/29/20  2:53 AM   Specimen: Nasopharyngeal Swab; Nasopharyngeal(NP) swabs in vial  transport medium  Result Value Ref Range Status   SARS Coronavirus 2 by RT PCR NEGATIVE NEGATIVE Final    Comment: (NOTE) SARS-CoV-2 target nucleic acids are NOT DETECTED.  The SARS-CoV-2 RNA is generally detectable in upper respiratory specimens during the acute phase of infection. The  lowest concentration of SARS-CoV-2 viral copies this assay can detect is 138 copies/mL. A negative result does not preclude SARS-Cov-2 infection and should not be used as the sole basis for treatment or other patient management decisions. A negative result may occur with  improper specimen collection/handling, submission of specimen other than nasopharyngeal swab, presence of viral mutation(s) within the areas targeted by this assay, and inadequate number of viral copies(<138 copies/mL). A negative result must be combined with clinical observations, patient history, and epidemiological information. The expected result is Negative.  Fact Sheet for Patients:  BloggerCourse.com  Fact Sheet for Healthcare Providers:  SeriousBroker.it  This test is no t yet approved or cleared by the [place] FDA and  has been authorized for detection and/or diagnosis of SARS-CoV-2 by FDA under an Emergency Use Authorization (EUA). This EUA will remain  in effect (meaning this test can be used) for the duration of the COVID-19 declaration under Section 564(b)(1) of the Act, 21 U.S.C.section 360bbb-3(b)(1), unless the authorization is terminated  or revoked sooner.       Influenza A by PCR NEGATIVE NEGATIVE Final   Influenza B by PCR NEGATIVE NEGATIVE Final    Comment: (NOTE) The Xpert Xpress SARS-CoV-2/FLU/RSV plus assay is intended as an aid in the diagnosis of influenza from Nasopharyngeal swab specimens and should not be used as a sole basis for treatment. Nasal washings and aspirates are unacceptable for Xpert Xpress SARS-CoV-2/FLU/RSV testing.  Fact Sheet for Patients: BloggerCourse.com  Fact Sheet for Healthcare Providers: SeriousBroker.it  This test is not yet approved or cleared by the [place] FDA and has been authorized for detection and/or diagnosis of SARS-CoV-2 by FDA under  an Emergency Use Authorization (EUA). This EUA will remain in effect (meaning this test can be used) for the duration of the COVID-19 declaration under Section 564(b)(1) of the Act, 21 U.S.C. section 360bbb-3(b)(1), unless the authorization is terminated or revoked.  Performed at [facility], [address] [place], [place] [address]   Culture, blood (Routine X 2) w Reflex to ID Panel     Status: None   Collection Time: 02/29/20  3:26 AM   Specimen: BLOOD  Result Value Ref Range Status   Specimen Description BLOOD RIGHT ANTECUBITAL  Final   Special Requests   Final    BOTTLES DRAWN AEROBIC AND ANAEROBIC Blood Culture adequate volume   Culture   Final    NO GROWTH 5 DAYS Performed at [facility], [address] [place], [place] [address]    Report Status 03/05/2020 FINAL  Final  Culture, blood (Routine X 2) w Reflex to ID Panel     Status: None   Collection Time: 02/29/20  3:43 AM   Specimen: BLOOD  Result Value Ref Range Status   Specimen Description BLOOD BLOOD RIGHT HAND  Final   Special Requests   Final    BOTTLES DRAWN AEROBIC AND ANAEROBIC Blood Culture results may not be optimal due to an inadequate volume of blood received in culture bottles   Culture   Final    NO GROWTH 5 DAYS Performed at [facility], [address], [place], [place] [address]    Report Status 03/05/2020 FINAL  Final  MRSA PCR Screening     Status: None   Collection Time: 02/29/20  9:45 AM   Specimen: Nasopharyngeal  Result Value Ref Range Status   MRSA by PCR NEGATIVE NEGATIVE Final    Comment:        The GeneXpert MRSA Assay (FDA approved for NASAL specimens only), is one component of a comprehensive MRSA colonization surveillance program. It is not intended to diagnose MRSA infection nor to guide or monitor treatment for MRSA infections. Performed at [facility], [address] [place], [place] [address]   Urine Culture     Status: None    Collection Time: 03/05/20  9:16 AM   Specimen: Urine, Catheterized  Result Value Ref Range Status   Specimen Description   Final    URINE, CATHETERIZED Performed at [facility], [address] [place], [place] [address]    Special Requests   Final    NONE Performed at [facility], [address] [place], [place] [address]    Culture   Final    NO GROWTH Performed at [facility], [address] [place], [place] [address]    Report Status 03/06/2020 FINAL  Final  CULTURE, BLOOD (ROUTINE X 2) w Reflex to ID Panel     Status: None (Preliminary result)   Collection Time: 03/05/20 10:02 AM   Specimen: BLOOD  Result Value Ref Range Status   Specimen Description [facility]LOOD [facility]  Final   Special Requests   Final    BOTTLES DRAWN AEROBIC AND ANAEROBIC Blood Culture adequate volume   Culture   Final    NO GROWTH 4 DAYS Performed at [facility], [address] [place], [place] [address]    Report Status PENDING  Incomplete  CULTURE, BLOOD (ROUTINE X 2) w Reflex to ID Panel     Status: None (Preliminary result)   Collection Time: 03/05/20 10:03 AM   Specimen: BLOOD  Result Value Ref Range Status   Specimen Description [facility]LOOD [facility]  Final   Special Requests   Final    BOTTLES DRAWN AEROBIC AND ANAEROBIC Blood Culture adequate volume   Culture   Final    NO GROWTH 4 DAYS Performed at [facility], [address] [place], [place] [address]    Report Status PENDING  Incomplete    IMAGING RESULTS: as above I have personally reviewed the films ? Impression/Recommendation ? Necrotizing pancreatitis- eventhough clinically improved and was on 6 days zosyn  It was Discontinued on 03/06/20. Now has worsening leucocytosis and started meropenem- recommend IR aspirate pancreas/ascits  And send for for fungal  and bacterial culture. Gi on board  Paroxysmal Afib- on metoprolol ? _AKI-  resolved  Anemia__________________________________________________ Discussed with patient,and care team Note:  This document was prepared using Dragon voice recognition software and may include unintentional dictation errors.

## 2020-03-09 NOTE — Progress Notes (Signed)
Physical Therapy Treatment Patient Details Name: Holly Navarro MRN: [id number] DOB: 02-Apr-1945 Today's Date: 03/09/2020    History of Present Illness Pt is a 75 y.o. female with history of hypertension, hyperlipidemia, gastric ulcer who presented to [facility] emergency department for sudden onset diffuse severe sharp abdominal pain. MD assessment includes: acute necrotizing pancreatitis, AKI, and acute hypoxic respiratory failure in the setting of atelectasis and left pleural effusion.    PT Comments    Pt was pleasant and put forth good effort with below therex but declined to get sitting up to EOB and accepted supine therex only.  Pt education provided on physiological benefits of activity but continued to decline to do any activity other than supine therex.  Once below therex was completed pt declined any additional therex.  Pt will benefit from HHPT services upon discharge to safely address deficits listed in patient problem list for decreased caregiver assistance and eventual return to PLOF.      Follow Up Recommendations  Home health PT;Supervision for mobility/OOB     Equipment Recommendations  Rolling walker with 5" wheels    Recommendations for Other Services       Precautions / Restrictions Precautions Precautions: Fall Restrictions Weight Bearing Restrictions: No    Mobility  Bed Mobility               General bed mobility comments: Pt refused bed mobility    Transfers                 General transfer comment: Declined to attempt  Ambulation/Gait                 Stairs             Wheelchair Mobility    Modified Rankin (Stroke Patients Only)       Balance                                            Cognition Arousal/Alertness: Awake/alert Behavior During Therapy: WFL for tasks assessed/performed Overall Cognitive Status: Within Functional Limits for tasks assessed                                         Exercises Total Joint Exercises Ankle Circles/Pumps: AROM;Strengthening;Both;10 reps (with manual resistance) Quad Sets: Strengthening;Both;10 reps Gluteal Sets: Strengthening;Both;10 reps Heel Slides: AROM;Strengthening;Both;10 reps Hip ABduction/ADduction: AROM;Strengthening;Both;10 reps (with manual resistance) Straight Leg Raises: Both;Strengthening;AROM;10 reps    General Comments        Pertinent Vitals/Pain Pain Assessment: 0-10 Pain Score: 3  Pain Location: abdomen Pain Descriptors / Indicators: Sore Pain Intervention(s): Premedicated before session;Monitored during session    Home Living                      Prior Function            PT Goals (current goals can now be found in the care plan section)      Frequency    Min 2X/week      PT Plan Current plan remains appropriate    Co-evaluation              AM-PAC PT "6 Clicks" Mobility   Outcome Measure  Help needed turning from your back to your side while in  a flat bed without using bedrails?: A Little Help needed moving from lying on your back to sitting on the side of a flat bed without using bedrails?: A Little Help needed moving to and from a bed to a chair (including a wheelchair)?: A Little Help needed standing up from a chair using your arms (e.g., wheelchair or bedside chair)?: A Little Help needed to walk in hospital room?: A Little Help needed climbing 3-5 steps with a railing? : A Little 6 Click Score: 18    End of Session   Activity Tolerance: Patient tolerated treatment well Patient left: in bed;with call bell/phone within reach;with bed alarm set;with family/visitor present Nurse Communication: Mobility status PT Visit Diagnosis: Muscle weakness (generalized) (M62.81);Difficulty in walking, not elsewhere classified (R26.2)     Time: 9147-8295 PT Time Calculation (min) (ACUTE ONLY): 17 min  Charges:  $Therapeutic Exercise: 8-22 mins                      D. Scott Brendalee Matthies PT, DPT 03/09/20, 1:35 PM

## 2020-03-09 NOTE — Progress Notes (Signed)
Patient ID: Holly Navarro, female   DOB: 1945-07-30, 75 y.o.   MRN: [id number] ID recommended IR consult for pancreatitic /ascitic fluid aspirate to ck for bacteria.  Spoke to family about the procedure they are agreeable to proceed.  Will make patient n.p.o. at midnight.

## 2020-03-09 NOTE — Progress Notes (Signed)
Nutrition Follow Up Note   DOCUMENTATION CODES:   Not applicable  INTERVENTION:   Boost Breeze po TID, each supplement provides 250 kcal and 9 grams of protein  MVI po daily   Recommend post pyloric nasogastric tube and feeds  If tube feeds initiated, recommend:  Osmolite 1.5 @50ml/hr- Initiate at 20ml/hr and increase by 10ml/hr q 8 hours until goal rate is reached.   Pro-Source 45ml daily via tube, provides 40kcal and 11g of protein per serving   Free water flushes 100ml q4 hours   Regimen provides 1840kcal/day, 86g/day protein and 1514ml/day free water   Pt at high refeed risk; recommend monitor potassium, magnesium and phosphorus labs daily after tube feed initiation.   NUTRITION DIAGNOSIS:   Inadequate oral intake related to acute illness as evidenced by other (comment) (pt on clear liquid diet).  GOAL:   Patient will meet greater than or equal to 90% of their needs  -not met   MONITOR:   PO intake,Supplement acceptance,Diet advancement,Labs,Weight trends,Skin,I & O's  ASSESSMENT:   75 y/o female with h/o seizures, CKD III, PUD, HLD and HTN who is admitted with acute pancreatitis  Pt continues to have poor appetite and oral intake in hospital. Pt reports early satiety and continued abdominal pain. Pt is eating only sips and bites of meals and is refusing most Boost Breeze. Spoke with MD, recommended post-pyloric feeding tube and nutrition support. GI following and spoke with patient and family about this today; pt declines post-pyloric tube at this time. RD will continue to monitor pt's oral intake. Pt remains at high refeed risk.   Per chart, pt appears weight stable since admit.   Medications reviewed and include: risaquad, heparin, insulin, MVI, protonix, simethicone, NaCl w/ 5% dextrose @50ml/hr, meropenem  Labs reviewed: K 3.4(L), creat 1.06(H), P 3.2 wnl Wbc- 23.8(H), Hgb 10.6(L), Hct 31.6(L) cbgs- 174, 131 x 24 hrs  Diet Order:   Diet Order             Diet clear liquid Room service appropriate? Yes; Fluid consistency: Thin  Diet effective now                EDUCATION NEEDS:   Education needs have been addressed  Skin:  Skin Assessment: Reviewed RN Assessment  Last BM:  3/7- type 6  Height:   Ht Readings from Last 1 Encounters:  02/29/20 5' 6" (1.676 m)    Weight:   Wt Readings from Last 1 Encounters:  03/09/20 70.2 kg    Ideal Body Weight:  59 kg  BMI:  Body mass index is 24.97 kg/m.  Estimated Nutritional Needs:   Kcal:  1600-1800kcal/day  Protein:  80-90g/day  Fluid:  1.6-1.8L/day    MS, RD, LDN Please refer to AMION for RD and/or RD on-call/weekend/after hours pager 

## 2020-03-09 NOTE — Progress Notes (Signed)
Spoke to Dr. Archer Asa, IR about patient having an aspiration of the pancreas tomorrow.  I asked about holding Heparin.  He gave order to hold 6am dose tomorrow.

## 2020-03-09 NOTE — Consult Note (Addendum)
   Rohini R Vanga, MD [address]  [address]  [place], [place] [address]  Main: [phone]  Fax: [phone] Pager: [phone]   Consultation  Referring Provider:     No ref. provider found Primary Care Physician:  Malfi, Nicole M, FNP Primary Gastroenterologist: Unassigned         Reason for Consultation:     Acute necrotizing pancreatitis  Date of Admission:  02/29/2020 Date of Consultation:  03/09/2020         HPI:   Holly Navarro is a 74 y.o. female who was admitted on 02/29/2020 secondary to acute necrotizing pancreatitis.  Patient was empirically started on antibiotics for acute necrotizing pancreatitis although there was no clinical or radiographic suspicion for infected pancreatic necrosis or pancreatic abscess.  She was on Zosyn, this was discontinued approximately 3 days ago as she was clinically improving as well as improvement in leukocytosis.  She is noted to have gradually worsening leukocytosis since 3/5 14.8 <<19.2<< 23.8 till today.  Patient is also complaining of upper abdominal discomfort, early satiety after eating small portion.  She has been on solid food.  She has been afebrile and hemodynamically stable other than mild tachycardia.  Due to abdominal discomfort and rising WBC count, she underwent CT chest abdomen pelvis with contrast yesterday which revealed revisualization of necrotic pancreatitis with exuberant adjacent fat stranding, scattered free fluid throughout the abdomen.  No evidence of focal drainable fluid collection or pseudoaneurysm at this point.    When interviewed the patient, she appeared comfortable and she denied abdominal pain other than postprandial abdominal discomfort.  She does report having bowel movements.  Her sons were bedside  NSAIDs: None  Antiplts/Anticoagulants/Anti thrombotics: None  GI Procedures: Unknown  Past Medical History:  Diagnosis Date  . Anxiety   . Arthritis   . Cardiac arrhythmia   . Hyperlipidemia   .  Stomach ulcer     Past Surgical History:  Procedure Laterality Date  . CYSTECTOMY      Prior to Admission medications   Medication Sig Start Date End Date Taking? Authorizing Provider  atenolol (TENORMIN) 50 MG tablet Take 1 tablet (50 mg total) by mouth daily. 12/11/19  Yes Malfi, Nicole M, FNP  cimetidine (TAGAMET) 200 MG tablet Take 200 mg by mouth 2 (two) times daily.   Yes [provider]  co-enzyme Q-10 30 MG capsule Take 30 mg by mouth 3 (three) times daily.   Yes [provider]  cyclobenzaprine (FLEXERIL) 10 MG tablet Take 10 mg by mouth 3 (three) times daily as needed for muscle spasms.   Yes [provider]  Dextromethorphan-guaiFENesin (CORICIDIN HBP CONGESTION/COUGH) 10-200 MG CAPS Take 2 capsules by mouth daily as needed.   Yes [provider]  lisinopril (ZESTRIL) 10 MG tablet Take 1 tablet (10 mg total) by mouth daily. 12/11/19  Yes Malfi, Nicole M, FNP  omega-3 acid ethyl esters (LOVAZA) 1 G capsule Take by mouth 2 (two) times daily.   Yes [provider]  OVER THE COUNTER MEDICATION Take 3 capsules by mouth daily. VISICLEAR - [facility] Formula   Yes [provider]  Phenylephrine-DM-GG (ROBITUSSIN MULTI-SYMPTOM MAX PO) Take 10 mLs by mouth every 4 (four) hours as needed.   Yes [provider]  rosuvastatin (CRESTOR) 10 MG tablet TAKE 1 TABLET BY MOUTH FIVE DAYS PER WEEK 12/11/19  Yes Malfi, Nicole M, FNP  diphenhydrAMINE (BENADRYL) 25 mg capsule Take 25 mg by mouth Nightly.  Patient   not taking: Reported on 02/29/2020    [provider]  hydrocortisone 2.5 % cream Apply topically. Patient not taking: Reported on 02/29/2020 08/26/19   [provider]  milk thistle 175 MG tablet Take 175 mg by mouth daily. Patient not taking: Reported on 02/29/2020    [provider]  Specialty Vitamins Products (ECHINACEA C COMPLETE PO) Take by mouth. Patient not taking: Reported on 02/29/2020     [provider]   Current Facility-Administered Medications:  .  acidophilus (RISAQUAD) capsule 1 capsule, 1 capsule, Oral, Daily, Amery, Sahar, MD, 1 capsule at 03/09/20 0916 .  dextrose 5 %-0.45 % sodium chloride infusion, , Intravenous, Continuous, Amery, Sahar, MD, Last Rate: 50 mL/hr at 03/09/20 1544, Infusion Verify at 03/09/20 1544 .  feeding supplement (BOOST / RESOURCE BREEZE) liquid 1 Container, 1 Container, Oral, TID BM, Kasa, Kurian, MD, 1 Container at 03/08/20 1321 .  fluticasone (FLONASE) 50 MCG/ACT nasal spray 2 spray, 2 spray, Each Nare, Daily, Mansy, Jan A, MD, 2 spray at 03/09/20 0922 .  heparin injection 5,000 Units, 5,000 Units, Subcutaneous, Q8H, Kasa, Kurian, MD, 5,000 Units at 03/09/20 1245 .  HYDROcodone-acetaminophen (NORCO/VICODIN) 5-325 MG per tablet 1-2 tablet, 1-2 tablet, Oral, Q6H PRN, Keene, Jeremiah D, NP, 1 tablet at 03/09/20 0931 .  HYDROmorphone (DILAUDID) injection 0.5 mg, 0.5 mg, Intravenous, Q2H PRN, Kasa, Kurian, MD, 0.5 mg at 03/05/20 0109 .  insulin aspart (novoLOG) injection 0-20 Units, 0-20 Units, Subcutaneous, TID WC, Keene, Jeremiah D, NP, 3 Units at 03/09/20 1245 .  insulin aspart (novoLOG) injection 0-5 Units, 0-5 Units, Subcutaneous, QHS, Keene, Jeremiah D, NP .  insulin detemir (LEVEMIR) injection 6 Units, 6 Units, Subcutaneous, Daily, Amery, Sahar, MD, 6 Units at 03/09/20 0920 .  meropenem (MERREM) 1 g in sodium chloride 0.9 % 100 mL IVPB, 1 g, Intravenous, Q12H, Amery, Sahar, MD, Stopped at 03/09/20 0952 .  multivitamin with minerals tablet 1 tablet, 1 tablet, Oral, Daily, Kasa, Kurian, MD, 1 tablet at 03/09/20 0916 .  pantoprazole (PROTONIX) injection 40 mg, 40 mg, Intravenous, Q24H, Rust-Chester, Britton L, NP, 40 mg at 03/06/20 2143 .  polyethylene glycol (MIRALAX / GLYCOLAX) packet 17 g, 17 g, Oral, Daily PRN, Amery, Sahar, MD .  senna-docusate (Senokot-S) tablet 1 tablet, 1 tablet, Oral, QHS PRN, Amery, Sahar, MD .  simethicone  (MYLICON) chewable tablet 80 mg, 80 mg, Oral, Daily, Amery, Sahar, MD, 80 mg at 03/09/20 0549   Family History  Problem Relation Age of Onset  . Heart disease Mother   . Kidney disease Mother   . Diabetes Father   . Cancer Brother        pencreatic     Social History   Tobacco Use  . Smoking status: Current Every Day Smoker    Packs/day: 0.50    Types: Cigarettes  . Smokeless tobacco: Never Used  Vaping Use  . Vaping Use: Never used  Substance Use Topics  . Alcohol use: Yes    Alcohol/week: 0.0 standard drinks    Comment: occasionally   . Drug use: No    Allergies as of 02/29/2020  . (No Known Allergies)    Review of Systems:    All systems reviewed and negative except where noted in HPI.   Physical Exam:  Vital signs in last 24 hours: Temp:  [98 F (36.7 C)-99.6 F (37.6 C)] 98 F (36.7 C) (03/07 1538) Pulse Rate:  [103-110] 110 (03/07 1538) Resp:  [16-24] 20 (03/07 1538) BP: (123-156)/(57-99) 132/57 (03/07   1538) SpO2:  [93 %-95 %] 94 % (03/07 1538) Weight:  [70.2 kg] 70.2 kg (03/07 0552) Last BM Date: 03/09/20 General:   Pleasant, cooperative in NAD Head:  Normocephalic and atraumatic. Eyes:   No icterus.   Conjunctiva pink. PERRLA. Ears:  Normal auditory acuity. Neck:  Supple; no masses or thyroidomegaly Lungs: Respirations even and unlabored. Lungs clear to auscultation bilaterally.   No wheezes, crackles, or rhonchi.  Heart:  Regular rate and rhythm;  Without murmur, clicks, rubs or gallops Abdomen:  Soft, moderately distended, nontender. Normal bowel sounds. No appreciable masses or hepatomegaly.  No rebound or guarding.  Rectal:  Not performed. Msk:  Symmetrical without gross deformities.  Strength generalized weakness Extremities:  Without edema, cyanosis or clubbing. Neurologic:  Alert and oriented x3;  grossly normal neurologically. Skin:  Intact without significant lesions or rashes. Cervical Nodes:  No significant cervical adenopathy. Psych:   Alert and cooperative. Normal affect.  LAB RESULTS: CBC Latest Ref Rng & Units 03/09/2020 03/08/2020 03/07/2020  WBC 4.0 - 10.5 K/uL 23.8(H) 19.2(H) 14.8(H)  Hemoglobin 12.0 - 15.0 g/dL 10.6(L) 10.2(L) 10.0(L)  Hematocrit 36.0 - 46.0 % 31.6(L) 30.4(L) 30.7(L)  Platelets 150 - 400 K/uL 439(H) 300 254    BMET BMP Latest Ref Rng & Units 03/09/2020 03/08/2020 03/07/2020  Glucose 70 - 99 mg/dL 140(H) 139(H) 170(H)  BUN 8 - 23 mg/dL 8 7(L) 9  Creatinine 0.44 - 1.00 mg/dL 1.06(H) 0.93 1.06(H)  BUN/Creat Ratio 6 - 22 (calc) - - -  Sodium 135 - 145 mmol/L 136 138 138  Potassium 3.5 - 5.1 mmol/L 3.4(L) 3.6 2.8(L)  Chloride 98 - 111 mmol/L 101 104 104  CO2 22 - 32 mmol/L _0 Calcium 8.9 - 10.3 mg/dL 8.3(L) 8.3(L) 7.9(L)    LFT Hepatic Function Latest Ref Rng & Units 03/09/2020 03/05/2020 03/04/2020  Total Protein 6.5 - 8.1 g/dL - 5.0(L) 5.7(L)  Albumin 3.5 - 5.0 g/dL 2.5(L) 2.2(L) 2.5(L)  AST 15 - 41 U/L - 23 26  ALT 0 - 44 U/L - 15 16  Alk Phosphatase 38 - 126 U/L - 68 50  Total Bilirubin 0.3 - 1.2 mg/dL - 1.5(H) 1.5(H)     STUDIES: CT CHEST ABDOMEN PELVIS W CONTRAST  Result Date: 03/08/2020 CLINICAL DATA:  Abdominal pain EXAM: CT CHEST, ABDOMEN, AND PELVIS WITH CONTRAST TECHNIQUE: Multidetector CT imaging of the chest, abdomen and pelvis was performed following the standard protocol during bolus administration of intravenous contrast. CONTRAST:  167m OMNIPAQUE IOHEXOL 300 MG/ML  SOLN COMPARISON:  February 29, 2020 FINDINGS: CT CHEST FINDINGS Cardiovascular: Heart is normal in size. Predominately LEFT-sided coronary artery atherosclerotic calcifications. Calcifications of the aortic valve. Soft and calcified plaque throughout the aorta. Mediastinum/Nodes: Thyroid is unremarkable. No axillary or mediastinal adenopathy. Lungs/Pleura: Small LEFT pleural effusion, increased in comparison to prior. Biapical irregular opacities likely scar. Centrilobular emphysema, moderate. RIGHT lower lobe pulmonary  nodule measures 6 x 5 mm (series 2, image 24). RIGHT middle lobe pulmonary nodule measures 5 mm (series 4, image 98). LEFT basilar atelectasis. Musculoskeletal: Degenerative changes of the thoracic spine. CT ABDOMEN PELVIS FINDINGS Hepatobiliary: Focal fatty deposition adjacent to the falciform ligament. Portal vein is patent. Gallbladder is mildly distended. Trace adjacent pericholecystic fluid, likely reactive. Pancreas: Majority of the pancreas is hypoattenuating with minimal normally enhancing pancreas at the uncinate process and a small focus at the tail. There is exuberant adjacent fat stranding and a small amount of adjacent fluid. No definitive rim enhancing organized collection.  No focal drainable fluid collection at this point in time. No definitive evidence of pseudoaneurysm formation at this point in time. Spleen: Unremarkable. Splenic vein is patent. Splenic artery is patent. Adrenals/Urinary Tract: Adrenal glands are unremarkable. Kidneys enhance symmetrically. No hydronephrosis. Bladder is unremarkable. Stomach/Bowel: No evidence of bowel obstruction. Mild prominence of the small bowel is likely due to mild ileus from inflammatory changes. Appendix is normal. Mild bowel wall thickening of the transverse colon and splenic flexure, likely reactive in etiology. Vascular/Lymphatic: Revisualization of an infrarenal abdominal aortic aneurysm measuring up to 31 mm. Severe soft and calcified plaque throughout the aorta. Reproductive: Fibroid uterus.  Prominent parametrial vessels. Other: Small volume free fluid throughout the abdomen with evolving post inflammatory changes along the pericolic gutters. Musculoskeletal: Degenerative changes of the lumbar spine. Levocurvature of the lumbar spine. IMPRESSION: 1. Revisualization of necrotic pancreatitis with exuberant adjacent fat stranding and scattered free fluid throughout the abdomen. No focal drainable fluid collection or pseudoaneurysm at this point in time.  2. Small LEFT pleural effusion, increased in comparison to prior. 3. Likely reactive mild ileus and bowel wall thickening of the colon. No evidence of bowel obstruction. 4. Small pulmonary nodules measuring up to 6 mm. Recommend follow-up CT in 1 year to assess for stability. 5. Revisualization of infrarenal abdominal aortic aneurysm measuring up to 31 mm. Recommend follow-up every 3 years. This recommendation follows ACR consensus guidelines: White Paper of the ACR Incidental Findings Committee II on Vascular Findings. J Am Coll Radiol 2013; 10:789-794. Aortic Atherosclerosis (ICD10-I70.0) and Emphysema (ICD10-J43.9). Electronically Signed   By: Valentino Saxon MD   On: 03/08/2020 15:48      Impression / Plan:   Holly Navarro is a 75 y.o. female with acute necrotizing pancreatitis Based on imaging, patient has acute severe pancreatitis with subtotal pancreatic necrosis with minimal visualization of normal appearing pancreas.  There was no evidence of abscess or fluid collection or pseudoaneurysm  Acute necrotizing pancreatitis Worsening leukocytosis, although very low clinical suspicion for infected necrosis of the pancreas at this time.  Leukocytosis could be an inflammatory response from necrosis of the pancreas Recommend blood cultures Recommend empiric dose of imipenem There is no evidence of endorgan damage Monitor LFTs periodically, there is no evidence of biliary obstruction I have discussed with patient regarding postpyloric, jejunal feeds due to worsening leukocytosis and decreased p.o. intake.  Patient is reluctant to start tube feeds at this time.  I have also discussed with her about risks of TPN and not beneficial in this setting versus jejunal tube feeds.  We will try clear liquids with protein supplements for next 24 hours.  I will again discuss with her about jejunal tube feeds tomorrow to optimize her nutrition If there is worsening leukocytosis despite being on antibiotics,  recommend patient to be transferred to tertiary care facility Recommend to start pancreatic enzymes I have also discussed in length what to expect long-term during recovery of necrotizing pancreatitis such as walled off necrosis or formation of pseudocyst that might require endoscopic or surgical drainage versus spontaneous resolution of the peripancreatic fluid collection.  She will have to adhere to small frequent meals, low-fat, low-carb, high-protein along with pancreatic enzymes.  I anticipate that she may develop severe pancreatic insufficiency due to subtotal necrotic pancreatitis  Thank you for involving me in the care of this patient.  We will follow along with you    LOS: 9 days   Sherri Sear, MD  03/09/2020, 4:27 PM   Note: This  dictation was prepared with Dragon dictation along with smaller phrase technology. Any transcriptional errors that result from this process are unintentional.

## 2020-03-09 NOTE — TOC Progression Note (Signed)
Transition of Care [facility]) - Progression Note    Patient Details  Name: Holly Navarro MRN: [id number] Date of Birth: 03-31-1945  [facility]) CM/SW Contact  Chapman Fitch, RN Phone Number: 03/09/2020, 4:45 PM  Clinical Narrative:      Referral made to [facility] with [employer] for RW and BSC.  To be delivered prior to discharge       Expected Discharge Plan and Services                                                 Social Determinants of Health (SDOH) Interventions    Readmission Risk Interventions No flowsheet data found.

## 2020-03-09 NOTE — Progress Notes (Signed)
PROGRESS NOTE    Holly Navarro  ZOX:[id number] DOB: [birth year] DOA: 02/29/2020 PCP: Tarri Fuller, FNP    Brief Narrative:  Holly Navarro a 75 y.o.femalewith history of hypertension, hyperlipidemia, gastric ulcer [facility] diffuse severe sharp abdominal [facility]. The discomfort began Around 10 PM last nightand was associated with onset of nausea and cramps.She did vomit and became diaphoretic after emesis. The pain did radiate across her entire abdomen and to her back. There was no associated diarrhea or lose stool and no hematochezia or hematemesis. She was found to be hypothermic and hypertensive upon her arrival she was warmed and started on fluids. Her evaluation returned a lactic acid of 6. There was a leukocytosis in excess of 20. A CT revealed necrotizing pancreatitis with a corroborating lipase. The case was discussed with our local surgeon who felt she may be best served in transfer. The case was then d/w with [facility] who explained there were no beds and without acute intervention planned, she would likely need to wait and be stabilized at [facility].  She was admitted to the ICU service on 2/26.  On 3/2 she was transferred to [facility] service. 3/3-febrile Tmax 100.2, GI consulted this am 3/4-spiked fever again tmax 100.5.  She was complaining of abdominal distention but improved with simethicone. Eating food today and tolerating 3/5-c/o gas pain again asking for simethicone which helped her before. Tolerating po intake 3/6-her abdominal extension improved with simethicone however still with fullness and abdominal pain.  Lasix 20 mg iv  given yesterday per nursing requiring less O2 and doing better today 3/7- feels full quickly, still with abd pain. CT scan reviewed , d/w GI. +BM. WBC continues to increase   Consultants:   PCCM, neurology, GI  Procedures:   2/26: CXR>>The lungs are symmetrically well expanded. Benign  calcified granuloma within the right mid lung zone. Nodular density at the left lung base likely represents a confluence of vascular and osseous shadow. The lungs are otherwise clear. No pneumothorax or pleural effusion. Cardiac size within normal limits. No acute bone Abnormality. 2/26: KUB>>Normal abdominal gas pattern. Inferior pelvis excluded from view. No definite gross free intraperitoneal gas. No organomegaly. Vascular calcifications are seen within the abdominal aorta. Osseous structures are age-appropriate. 2/26: CT Abdomen & Pelvis>>1. Severe Acute Pancreatitis, with subtotal associated Pancreatic Necrosis. 2. Moderate volume free fluid and inflammation throughout the upper abdomen. No organized or rim enhancing fluid collection at this time. No vascular occlusion or other complicating features. 3. Infrarenal abdominal aortic aneurysm measuring 31 mm. Recommend follow-up Ultrasound every 3 years. This recommendation follows ACR consensus guidelines: White Paper of the ACR Incidental Findings Committee II on Vascular Findings. J Am Coll Radiol 2013; 81:191-478. 4. Aortic Atherosclerosis (ICD10-I70.0) and Emphysema (ICD10-J43.9). 2/26: CT Head>>No acute intracranial abnormality. Normal examination. 2/26: US Abdomen (RUQ)>>1. Trace volume of ascites adjacent to the liver. 2. Otherwise, unremarkable examination. Specifically, no gallstones, signs of acute cholecystitis, or evidence of biliary tract Dilatation. 2/28: Echocardiogram>>1. Left ventricular ejection fraction, by estimation, is 65 to 70%. The  left ventricle has normal function. The left ventricle has no regional  wall motion abnormalities. Left ventricular diastolic parameters are  consistent with Grade I diastolic  dysfunction (impaired relaxation).  2. Right ventricular systolic function is normal. The right ventricular  size is normal. Tricuspid regurgitation signal is inadequate for assessing  PA pressure.   3. The mitral valve is normal in structure. No evidence of mitral valve  regurgitation.  No evidence of mitral stenosis.  4. The aortic valve is normal in structure. Aortic valve regurgitation is  not visualized. Mild aortic valve stenosis. Aortic valve area, by VTI  measures 2.07 cm. Aortic valve mean gradient measures 6.0 mmHg.  5. The inferior vena cava is normal in size with greater than 50%  respiratory variability, suggesting right atrial pressure of 3 mmHg 2/28:CXR>>Normal heart size, mediastinal contours, and pulmonary vascularity. LEFT pleural effusion new since previous exam with LEFT basilar atelectasis.Subsegmental atelectasis at RIGHT base as well. Upper lungs clear. No pneumothorax. Bones demineralized.     Antimicrobials:  2/26: SARS-CoV-2 PCR>> negative 2/26: Influenza A&B PCR>> negative 2/26: Blood culture x2>> no growth to date 2/26: MRSA PCR>> negative  Cefepime 2/26>> 2/27 Flagyl 2/26>> 2/27 Zosyn 2/27>>  Subjective:  sob little better. Still with abd pain, get full quickly  Objective: Vitals:   03/08/20 1624 03/08/20 2114 03/08/20 2341 03/09/20 0552  BP: 107/74 124/62 132/63 140/61  Pulse: (!) 110 (!) 108 (!) 109 (!) 108  Resp: 18 18 16  (!) 24  Temp: 99.8 F (37.7 C) 99.5 F (37.5 C) 98.3 F (36.8 C) 99.1 F (37.3 C)  TempSrc: Oral     SpO2: 93% 94% 93% 95%  Weight:    70.2 kg  Height:        Intake/Output Summary (Last 24 hours) at 03/09/2020 0816 Last data filed at 03/09/2020 0552 Gross per 24 hour  Intake 240 ml  Output 1201 ml  Net -961 ml   Filed Weights   03/05/20 0415 03/07/20 0502 03/09/20 0552  Weight: 74.3 kg 71.5 kg 70.2 kg    Examination: Calm, nad Decrease bs at bases, no wheezing Regular s1 s2 no gallop Soft distended, +bs, ttp epigastric area No edema Aaxoxo3, grossly intact    Data Reviewed: I have personally reviewed following labs and imaging studies  CBC: Recent Labs  Lab 03/03/20 0331 03/04/20 0525  03/05/20 0749 03/07/20 0454 03/08/20 0445 03/09/20 0449  WBC 10.5 12.8* 10.9* 14.8* 19.2* 23.8*  NEUTROABS 8.3* 9.0* 7.1  --   --   --   HGB 11.9* 11.6* 10.2* 10.0* 10.2* 10.6*  HCT 36.6 35.1* 31.9* 30.7* 30.4* 31.6*  MCV 96.8 96.2 97.6 95.9 93.8 93.5  PLT 103* 131* 148* 254 300 439*   Basic Metabolic Panel: Recent Labs  Lab 03/03/20 0331 03/03/20 2117 03/04/20 0525 03/05/20 0749 03/06/20 0527 03/07/20 0454 03/08/20 0445 03/09/20 0449  NA 141  --  138 135  --  138 138 136  K 3.4*  --  3.4* 3.6  --  2.8* 3.6 3.4*  CL 112*  --  107 104  --  104 104 101  CO2 23  --  25 24  --  27 26 25   GLUCOSE 267*  --  126* 139*  --  170* 139* 140*  BUN 24*  --  18 15  --  9 7* 8  CREATININE 1.09*  --  1.16* 1.15*  --  1.06* 0.93 1.06*  CALCIUM 8.0*  --  8.1* 7.9*  --  7.9* 8.3* 8.3*  MG 1.9  --  2.0 2.1 2.3  --   --   --   PHOS 1.5*   < > 2.4* 2.4* 2.0* 2.1*  --  3.2   < > = values in this interval not displayed.   GFR: Estimated Creatinine Clearance: 43.6 mL/min (A) (by C-G formula based on SCr of 1.06 mg/dL (H)). Liver Function Tests: Recent Labs  Lab  03/03/20 0331 03/04/20 0525 03/05/20 0749 03/09/20 0449  AST 42* 26 23  --   ALT --   ALKPHOS 37* 50 68  --   BILITOT 2.0* 1.5* 1.5*  --   PROT 5.3* 5.7* 5.0*  --   ALBUMIN 2.5* 2.5* 2.2* 2.5*   No results for input(s): LIPASE, AMYLASE in the last 168 hours. No results for input(s): AMMONIA in the last 168 hours. Coagulation Profile: No results for input(s): INR, PROTIME in the last 168 hours. Cardiac Enzymes: No results for input(s): CKTOTAL, CKMB, CKMBINDEX, TROPONINI in the last 168 hours. BNP (last 3 results) No results for input(s): PROBNP in the last 8760 hours. HbA1C: No results for input(s): HGBA1C in the last 72 hours. CBG: Recent Labs  Lab 03/08/20 0754 03/08/20 1153 03/08/20 1731 03/08/20 2121 03/09/20 0753  GLUCAP 152* 159* 86 121* 174*   Lipid Profile: No results for input(s): CHOL, HDL,  LDLCALC, TRIG, CHOLHDL, LDLDIRECT in the last 72 hours. Thyroid Function Tests: No results for input(s): TSH, T4TOTAL, FREET4, T3FREE, THYROIDAB in the last 72 hours. Anemia Panel: No results for input(s): VITAMINB12, FOLATE, FERRITIN, TIBC, IRON, RETICCTPCT in the last 72 hours. Sepsis Labs: Recent Labs  Lab 03/03/20 0331 03/04/20 0525  PROCALCITON 1.78 1.32    Recent Results (from the past 240 hour(s))  Resp Panel by RT-PCR (Flu A&B, Covid) Nasopharyngeal Swab     Status: None   Collection Time: 02/29/20  2:53 AM   Specimen: Nasopharyngeal Swab; Nasopharyngeal(NP) swabs in vial transport medium  Result Value Ref Range Status   SARS Coronavirus 2 by RT PCR NEGATIVE NEGATIVE Final    Comment: (NOTE) SARS-CoV-2 target nucleic acids are NOT DETECTED.  The SARS-CoV-2 RNA is generally detectable in upper respiratory specimens during the acute phase of infection. The lowest concentration of SARS-CoV-2 viral copies this assay can detect is 138 copies/mL. A negative result does not preclude SARS-Cov-2 infection and should not be used as the sole basis for treatment or other patient management decisions. A negative result may occur with  improper specimen collection/handling, submission of specimen other than nasopharyngeal swab, presence of viral mutation(s) within the areas targeted by this assay, and inadequate number of viral copies(<138 copies/mL). A negative result must be combined with clinical observations, patient history, and epidemiological information. The expected result is Negative.  Fact Sheet for Patients:  BloggerCourse.com  Fact Sheet for Healthcare Providers:  SeriousBroker.it  This test is no t yet approved or cleared by the [place] FDA and  has been authorized for detection and/or diagnosis of SARS-CoV-2 by FDA under an Emergency Use Authorization (EUA). This EUA will remain  in effect (meaning this test  can be used) for the duration of the COVID-19 declaration under Section 564(b)(1) of the Act, 21 U.S.C.section 360bbb-3(b)(1), unless the authorization is terminated  or revoked sooner.       Influenza A by PCR NEGATIVE NEGATIVE Final   Influenza B by PCR NEGATIVE NEGATIVE Final    Comment: (NOTE) The Xpert Xpress SARS-CoV-2/FLU/RSV plus assay is intended as an aid in the diagnosis of influenza from Nasopharyngeal swab specimens and should not be used as a sole basis for treatment. Nasal washings and aspirates are unacceptable for Xpert Xpress SARS-CoV-2/FLU/RSV testing.  Fact Sheet for Patients: BloggerCourse.com  Fact Sheet for Healthcare Providers: SeriousBroker.it  This test is not yet approved or cleared by the [place] FDA and has been authorized for detection and/or diagnosis of SARS-CoV-2 by FDA under  an Emergency Use Authorization (EUA). This EUA will remain in effect (meaning this test can be used) for the duration of the COVID-19 declaration under Section 564(b)(1) of the Act, 21 U.S.C. section 360bbb-3(b)(1), unless the authorization is terminated or revoked.  Performed at [facility], [address] [place], [place] [address]   Culture, blood (Routine X 2) w Reflex to ID Panel     Status: None   Collection Time: 02/29/20  3:26 AM   Specimen: BLOOD  Result Value Ref Range Status   Specimen Description BLOOD RIGHT ANTECUBITAL  Final   Special Requests   Final    BOTTLES DRAWN AEROBIC AND ANAEROBIC Blood Culture adequate volume   Culture   Final    NO GROWTH 5 DAYS Performed at [facility], [address] [place], [place] [address]    Report Status 03/05/2020 FINAL  Final  Culture, blood (Routine X 2) w Reflex to ID Panel     Status: None   Collection Time: 02/29/20  3:43 AM   Specimen: BLOOD  Result Value Ref Range Status   Specimen Description BLOOD BLOOD RIGHT HAND  Final    Special Requests   Final    BOTTLES DRAWN AEROBIC AND ANAEROBIC Blood Culture results may not be optimal due to an inadequate volume of blood received in culture bottles   Culture   Final    NO GROWTH 5 DAYS Performed at [facility], [address] [place], [place] [address]    Report Status 03/05/2020 FINAL  Final  MRSA PCR Screening     Status: None   Collection Time: 02/29/20  9:45 AM   Specimen: Nasopharyngeal  Result Value Ref Range Status   MRSA by PCR NEGATIVE NEGATIVE Final    Comment:        The GeneXpert MRSA Assay (FDA approved for NASAL specimens only), is one component of a comprehensive MRSA colonization surveillance program. It is not intended to diagnose MRSA infection nor to guide or monitor treatment for MRSA infections. Performed at [facility], [address] [place], [place] [address]   Urine Culture     Status: None   Collection Time: 03/05/20  9:16 AM   Specimen: Urine, Catheterized  Result Value Ref Range Status   Specimen Description   Final    URINE, CATHETERIZED Performed at [facility], [address] [place], [place] [address]    Special Requests   Final    NONE Performed at [facility], [address] [place], [place] [address]    Culture   Final    NO GROWTH Performed at [facility], [address] [place], [place] [address]    Report Status 03/06/2020 FINAL  Final  CULTURE, BLOOD (ROUTINE X 2) w Reflex to ID Panel     Status: None (Preliminary result)   Collection Time: 03/05/20 10:02 AM   Specimen: BLOOD  Result Value Ref Range Status   Specimen Description BLOOD LEFT [facility]  Final   Special Requests   Final    BOTTLES DRAWN AEROBIC AND ANAEROBIC Blood Culture adequate volume   Culture   Final    NO GROWTH 4 DAYS Performed at [facility], [address] [place], [place] [address]    Report Status PENDING  Incomplete  CULTURE, BLOOD (ROUTINE X 2) w Reflex to ID Panel      Status: None (Preliminary result)   Collection Time: 03/05/20 10:03 AM   Specimen: BLOOD  Result Value Ref Range Status   Specimen Description BLOOD [facility]  Final   Special Requests   Final    BOTTLES DRAWN AEROBIC AND ANAEROBIC Blood Culture adequate volume   Culture   Final    NO GROWTH 4 DAYS Performed at [facility], [address] [place], [place] [address]    Report Status PENDING  Incomplete         Radiology Studies: CT CHEST ABDOMEN PELVIS W CONTRAST  Result Date: 03/08/2020 CLINICAL DATA:  Abdominal pain EXAM: CT CHEST, ABDOMEN, AND PELVIS WITH CONTRAST TECHNIQUE: Multidetector CT imaging of the chest, abdomen and pelvis was performed following the standard protocol during bolus administration of intravenous contrast. CONTRAST:  OMNIPAQUE IOHEXOL 300 MG/ML  SOLN COMPARISON:  February 29, 2020 FINDINGS: CT CHEST FINDINGS Cardiovascular: Heart is normal in size. Predominately LEFT-sided coronary artery atherosclerotic calcifications. Calcifications of the aortic valve. Soft and calcified plaque throughout the aorta. Mediastinum/Nodes: Thyroid is unremarkable. No axillary or mediastinal adenopathy. Lungs/Pleura: Small LEFT pleural effusion, increased in comparison to prior. Biapical irregular opacities likely scar. Centrilobular emphysema, moderate. RIGHT lower lobe pulmonary nodule measures 6 x 5 mm (series 2, image 24). RIGHT middle lobe pulmonary nodule measures 5 mm (series 4, image 98). LEFT basilar atelectasis. Musculoskeletal: Degenerative changes of the thoracic spine. CT ABDOMEN PELVIS FINDINGS Hepatobiliary: Focal fatty deposition adjacent to the falciform ligament. Portal vein is patent. Gallbladder is mildly distended. Trace adjacent pericholecystic fluid, likely reactive. Pancreas: Majority of the pancreas is hypoattenuating with minimal normally enhancing pancreas at the uncinate process and a small focus at the tail. There is exuberant adjacent fat stranding  and a small amount of adjacent fluid. No definitive rim enhancing organized collection. No focal drainable fluid collection at this point in time. No definitive evidence of pseudoaneurysm formation at this point in time. Spleen: Unremarkable. Splenic vein is patent. Splenic artery is patent. Adrenals/Urinary Tract: Adrenal glands are unremarkable. Kidneys enhance symmetrically. No hydronephrosis. Bladder is unremarkable. Stomach/Bowel: No evidence of bowel obstruction. Mild prominence of the small bowel is likely due to mild ileus from inflammatory changes. Appendix is normal. Mild bowel wall thickening of the transverse colon and splenic flexure, likely reactive in etiology. Vascular/Lymphatic: Revisualization of an infrarenal abdominal aortic aneurysm measuring up to 31 mm. Severe soft and calcified plaque throughout the aorta. Reproductive: Fibroid uterus.  Prominent parametrial vessels. Other: Small volume free fluid throughout the abdomen with evolving post inflammatory changes along the pericolic gutters. Musculoskeletal: Degenerative changes of the lumbar spine. Levocurvature of the lumbar spine. IMPRESSION: 1. Revisualization of necrotic pancreatitis with exuberant adjacent fat stranding and scattered free fluid throughout the abdomen. No focal drainable fluid collection or pseudoaneurysm at this point in time. 2. Small LEFT pleural effusion, increased in comparison to prior. 3. Likely reactive mild ileus and bowel wall thickening of the colon. No evidence of bowel obstruction. 4. Small pulmonary nodules measuring up to 6 mm. Recommend follow-up CT in 1 year to assess for stability. 5. Revisualization of infrarenal abdominal aortic aneurysm measuring up to 31 mm. Recommend follow-up every 3 years. This recommendation follows ACR consensus guidelines: White Paper of the ACR Incidental Findings Committee II on Vascular Findings. J Am Coll Radiol 2013; 10:789-794. Aortic Atherosclerosis (ICD10-I70.0) and  Emphysema (ICD10-J43.9). Electronically Signed   By: Meda Klinefelter MD   On: 03/08/2020 15:48        Scheduled Meds: . feeding supplement  1 Container Oral TID BM  . fluticasone  2 spray Each Nare  Daily  . heparin injection (subcutaneous)  5,000 Units Subcutaneous Q8H  . insulin aspart  0-20 Units Subcutaneous TID WC  . insulin aspart  0-5 Units Subcutaneous QHS  . insulin detemir  6 Units Subcutaneous Daily  . multivitamin with minerals  1 tablet Oral Daily  . pantoprazole (PROTONIX) IV  40 mg Intravenous Q24H  . polyethylene glycol  17 g Oral BID  . potassium chloride  40 mEq Oral Once  . senna-docusate  1 tablet Oral Daily  . simethicone  80 mg Oral Daily   Continuous Infusions: . piperacillin-tazobactam (ZOSYN)  IV      Assessment & Plan:   Active Problems:   Acute pancreatic necrosis   Seizure (HCC)   Necrotizing pancreatitis   Acute Necrotizing Pancreatitis -No role for surgery or perc drain at this time, there is not a discrete collection that would benefit -There is no obvious intervention required at the level of the gallbladder or duct -[facility] if come to that point -2/3-Monitor fever curve and trend WBC and procalcitonin  Procalcitonin is decreasing  Lactic acid normalized  Ethanol level less than 10, triglyceride 169  Right upper quadrant abdominal ultrasound on 2/26 without gallstones, acute cholecystitis, or biliary tract dilatation  3/3-GI consulted since pt febrile and has pain GIs input was appreciated-clear and apparently is improving.  If patient's symptoms increase or patient starts to date.  Recommend repeating CT scan of the abdomen but for now hold off on CT scan. 3/4-GI signed off. No sign of pancreatic necrosis being infected per GI.  3/5- zosyn dc/'d yesterday as she has received it for few day . Had fever on it . Will observe and trend fever off abx. 3/6-afebrile, but leukocytosis  trending up at 19.2 Will obtain ct chest ab/pel.  With abd pain and trending up of wbc.  3/7- ct c/a/p- see report. Based on findings, GI Dr. Allegra LaiVanga stated place dopoff to rest pancrease. GI will f/u with pt Since wbc trending up still, will start meropenem broad spectrum ID consulted. Recheck bcx    Fever- possibly atelectasis? Doubt pna.  Wbc up Zosyn dc'd on 4/4 3/6-afebrile, but continues to have cbc trending up Off abx bcx neg to date, will f/u Ck ct chest/abd/pelvis Will continue, if has fever, will repeat ct scan abd 3/7-start meropenem since wbc up ID consult Repeat bcx    Acute Hypoxic Respiratory Failure in the setting of Atelectasis and Left Pleural Effusion -Supplemental O2 as needed to maintain O2 sats >92% -Follow intermittent CXR and ABG as needed -CXR on 2/28 with new LEFT pleural effusion with bibasilar atelectasis greater on LEFT 3/4 -pleural effusion due to pancreatitis Echo 02/23/2020 with LVEF 65 to 70%, grade 1 diastolic dysfunction Flutter valve and incentive spirometer Wean down O2 as tolerated to keep O2 sat above 92% 3/6-mildly still fluid overloaded 3/7-ct chest -Small LEFT pleural effusion, increased in comparison to prior bnp was elevated, received 2 doses of lasix 20mg  iv , responded well Continue 02 support to keep 02>92% IS     Hypokalemia-  Replace Monitor levels  Small pulmonary nodules- found on ct chest 03/08/20 Needs f/u CT in one year to assess for stability  Acute Metabolic Encephalopathy>>resolved Generalized Tonic-Clonic Seizure Has first-time seizure in the ED.  This is provoked episodes in the setting of significant pancreatitis with lactic acidosis. EEG -received a mild diffuse encephalopathy, nonspecific etiology.  Possibly toxic metabolic etiology.  No seizure or  definite epileptiform discharges were seen  Per neurology keppra was d/c'd No indication for AED at this time Neurology signed off   infrarenal abdominal  aortic aneurysm  Measuring up to 31 mm. Recommend follow-up every 3 years.    Acute Kidney Injury Resolved Continue to monitor levels Encourage p.o. intake Ensure adequate renal perfusion Avoid nephrotoxic agents     Hyperglycemia A1c 5.8 BG levels stable continue lantus 6units riss         DVT prophylaxis: Heparin subcu Code Status: DNR Family Communication: none at bedside  Status is: Inpatient  Remains inpatient appropriate because:Inpatient level of care appropriate due to severity of illness   Dispo: The patient is from: Home              Anticipated d/c is to: home with home health              Patient currently is not medically stable to d/c.   Difficult to place patient No  Likely dc in 2  days , still with pain,  w/u pending, wbc up           LOS: 9 days   Time spent:  with more than 50% on COC    Lynn Ito, MD [facility] Pager [phone]  If 7PM-7AM, please contact night-coverage 03/09/2020, 8:16 AM

## 2020-03-10 ENCOUNTER — Inpatient Hospital Stay: Payer: Medicare HMO

## 2020-03-10 DIAGNOSIS — N179 Acute kidney failure, unspecified: Secondary | ICD-10-CM | POA: Diagnosis not present

## 2020-03-10 DIAGNOSIS — R739 Hyperglycemia, unspecified: Secondary | ICD-10-CM | POA: Diagnosis not present

## 2020-03-10 DIAGNOSIS — E876 Hypokalemia: Secondary | ICD-10-CM | POA: Diagnosis not present

## 2020-03-10 DIAGNOSIS — D649 Anemia, unspecified: Secondary | ICD-10-CM

## 2020-03-10 DIAGNOSIS — K8591 Acute pancreatitis with uninfected necrosis, unspecified: Secondary | ICD-10-CM | POA: Diagnosis not present

## 2020-03-10 LAB — CBC
HCT: 31.6 % — ABNORMAL LOW (ref 36.0–46.0)
Hemoglobin: 10.5 g/dL — ABNORMAL LOW (ref 12.0–15.0)
MCH: 31.3 pg (ref 26.0–34.0)
MCHC: 33.2 g/dL (ref 30.0–36.0)
MCV: 94 fL (ref 80.0–100.0)
Platelets: 506 10*3/uL — ABNORMAL HIGH (ref 150–400)
RBC: 3.36 MIL/uL — ABNORMAL LOW (ref 3.87–5.11)
RDW: 13.2 % (ref 11.5–15.5)
WBC: 17.9 10*3/uL — ABNORMAL HIGH (ref 4.0–10.5)
nRBC: 0 % (ref 0.0–0.2)

## 2020-03-10 LAB — POTASSIUM: Potassium: 3.6 mmol/L (ref 3.5–5.1)

## 2020-03-10 LAB — CULTURE, BLOOD (ROUTINE X 2)
Culture: NO GROWTH
Culture: NO GROWTH
Special Requests: ADEQUATE
Special Requests: ADEQUATE

## 2020-03-10 LAB — HEPATIC FUNCTION PANEL
ALT: 28 U/L (ref 0–44)
AST: 26 U/L (ref 15–41)
Albumin: 2.3 g/dL — ABNORMAL LOW (ref 3.5–5.0)
Alkaline Phosphatase: 130 U/L — ABNORMAL HIGH (ref 38–126)
Bilirubin, Direct: 0.2 mg/dL (ref 0.0–0.2)
Indirect Bilirubin: 0.8 mg/dL (ref 0.3–0.9)
Total Bilirubin: 1 mg/dL (ref 0.3–1.2)
Total Protein: 6.2 g/dL — ABNORMAL LOW (ref 6.5–8.1)

## 2020-03-10 LAB — GLUCOSE, CAPILLARY
Glucose-Capillary: 170 mg/dL — ABNORMAL HIGH (ref 70–99)
Glucose-Capillary: 100 mg/dL — ABNORMAL HIGH (ref 70–99)
Glucose-Capillary: 193 mg/dL — ABNORMAL HIGH (ref 70–99)
Glucose-Capillary: 244 mg/dL — ABNORMAL HIGH (ref 70–99)

## 2020-03-10 MED ORDER — POTASSIUM CHLORIDE CRYS ER 20 MEQ PO TBCR: 40.0000 meq | EXTENDED_RELEASE_TABLET | Freq: Once | ORAL | Status: DC

## 2020-03-10 MED ORDER — SIMETHICONE 80 MG PO CHEW
80.0000 mg | CHEWABLE_TABLET | Freq: Four times a day (QID) | ORAL | Status: DC | PRN
  Administered 2020-03-12: 80 mg via ORAL
  Filled 2020-03-10: qty 1

## 2020-03-10 MED ORDER — POTASSIUM CHLORIDE 10 MEQ/100ML IV SOLN: 10.0000 meq | INTRAVENOUS | Status: DC

## 2020-03-10 NOTE — Progress Notes (Signed)
OT Cancellation Note  Patient Details Name: Holly Navarro MRN: [id number] DOB: 1945/07/26   Cancelled Treatment:    Reason Eval/Treat Not Completed: Patient declined, no reason specified. Upon arrival pt reports fatigue as she is npo for a procedure this AM. Requesting to hold and re-attempt PM or next date as able. Will continue to follow POC.   Kathie Dike, M.S. OTR/L  03/10/20, 11:47 AM  ascom [phone]

## 2020-03-10 NOTE — TOC Progression Note (Signed)
Transition of Care [facility]) - Progression Note    Patient Details  Name: Holly Navarro MRN: [id number] Date of Birth: [birth year]  Transition of Care [facility]) CM/SW Contact  Chapman Fitch, RN Phone Number: 03/10/2020, 3:03 PM  Clinical Narrative:     RW and BSC delivered to room Patient currently on acute O2.  Bedside RN to attempt to wean  [place] with [facility] updated on status       Expected Discharge Plan and Services                                                 Social Determinants of Health (SDOH) Interventions    Readmission Risk Interventions No flowsheet data found.

## 2020-03-10 NOTE — Progress Notes (Signed)
Holly Repress, MD [address]  [address]  [place], [place] [address]  Main: [phone]  Fax: [phone] Pager: [phone]   Subjective: Patient is upset that she did not go to radiology for the procedure yet.  She tolerated clear liquids well yesterday.  She denies any abdominal pain.  Her son is bedside.  Has been afebrile, hemodynamically stable.  No acute events overnight   Objective: Vital signs in last 24 hours: Vitals:   03/09/20 2248 03/10/20 0415 03/10/20 0800 03/10/20 1120  BP: 119/60 121/62 139/60 137/71  Pulse: (!) 119 92  97  Resp: 20 (!) 24  17  Temp: 98.3 F (36.8 C) 97.9 F (36.6 C) 98.7 F (37.1 C) 98.9 F (37.2 C)  TempSrc: Oral  Oral   SpO2: 91% 95%  95%  Weight:  69.1 kg    Height:       Weight change: -1.043 kg  Intake/Output Summary (Last 24 hours) at 03/10/2020 1409 Last data filed at 03/10/2020 0333 Gross per 24 hour  Intake 1655.1 ml  Output 400 ml  Net 1255.1 ml     Exam: Heart:: Regular rate and rhythm, S1S2 present or without murmur or extra heart sounds Lungs: normal and clear to auscultation Abdomen: Soft, nontender, mildly distended   Lab Results: CBC Latest Ref Rng & Units 03/10/2020 03/09/2020 03/08/2020  WBC 4.0 - 10.5 K/uL 17.9(H) 23.8(H) 19.2(H)  Hemoglobin 12.0 - 15.0 g/dL 10.5(L) 10.6(L) 10.2(L)  Hematocrit 36.0 - 46.0 % 31.6(L) 31.6(L) 30.4(L)  Platelets 150 - 400 K/uL 506(H) 439(H) 300   CMP Latest Ref Rng & Units 03/10/2020 03/09/2020 03/08/2020  Glucose 70 - 99 mg/dL - 644(I) 347(Q)  BUN 8 - 23 mg/dL - 8 7(L)  Creatinine 2.59 - 1.00 mg/dL - 5.63(O) 7.56  Sodium 135 - 145 mmol/L - 136 138  Potassium 3.5 - 5.1 mmol/L 3.6 3.4(L) 3.6  Chloride 98 - 111 mmol/L - 101 104  CO2 22 - 32 mmol/L - 25 26  Calcium 8.9 - 10.3 mg/dL - 8.3(L) 8.3(L)  Total Protein 6.5 - 8.1 g/dL 6.2(L) - -  Total Bilirubin 0.3 - 1.2 mg/dL 1.0 - -  Alkaline Phos 38 - 126 U/L 130(H) - -  AST 15 - 41 U/L 26 - -  ALT 0 - 44 U/L 28 - -     Micro Results: Recent Results (from the past 240 hour(s))  Urine Culture     Status: None   Collection Time: 03/05/20  9:16 AM   Specimen: Urine, Catheterized  Result Value Ref Range Status   Specimen Description   Final    URINE, CATHETERIZED Performed at [facility], [address], [place], [place] [address]    Special Requests   Final    NONE Performed at [facility], [address], [place], [place] [address]    Culture   Final    NO GROWTH Performed at [facility], [address] [place], [place] [address]    Report Status 03/06/2020 FINAL  Final  CULTURE, BLOOD (ROUTINE X 2) w Reflex to ID Panel     Status: None   Collection Time: 03/05/20 10:02 AM   Specimen: BLOOD  Result Value Ref Range Status   Specimen Description BLOOD LEFT AC  Final   Special Requests   Final    BOTTLES DRAWN AEROBIC AND ANAEROBIC Blood Culture adequate volume   Culture   Final    NO GROWTH 5 DAYS Performed  at [facility], [address] [place], [place] [address]    Report Status 03/10/2020 FINAL  Final  CULTURE, BLOOD (ROUTINE X 2) w Reflex to ID Panel     Status: None   Collection Time: 03/05/20 10:03 AM   Specimen: BLOOD  Result Value Ref Range Status   Specimen Description BLOOD [facility]  Final   Special Requests   Final    BOTTLES DRAWN AEROBIC AND ANAEROBIC Blood Culture adequate volume   Culture   Final    NO GROWTH 5 DAYS Performed at [facility], [address] [place], [place] [address]    Report Status 03/10/2020 FINAL  Final  CULTURE, BLOOD (ROUTINE X 2) w Reflex to ID Panel     Status: None (Preliminary result)   Collection Time: 03/09/20 12:22 PM   Specimen: BLOOD  Result Value Ref Range Status   Specimen Description BLOOD LEFT WRIST  Final   Special Requests   Final    BOTTLES DRAWN AEROBIC AND ANAEROBIC Blood Culture adequate volume   Culture   Final    NO GROWTH < 24 HOURS Performed at [facility], [address] [place], [place] [address]    Report Status PENDING  Incomplete  CULTURE, BLOOD (ROUTINE X 2) w Reflex to ID Panel     Status: None (Preliminary result)   Collection Time: 03/09/20 12:24 PM   Specimen: BLOOD  Result Value Ref Range Status   Specimen Description BLOOD BLOOD RIGHT HAND  Final   Special Requests   Final    BOTTLES DRAWN AEROBIC AND ANAEROBIC Blood Culture adequate volume   Culture   Final    NO GROWTH < 24 HOURS Performed at [facility], [address], [place], [place] [address]    Report Status PENDING  Incomplete   Studies/Results: CT CHEST ABDOMEN PELVIS W CONTRAST  Result Date: 03/08/2020 CLINICAL DATA:  Abdominal pain EXAM: CT CHEST, ABDOMEN, AND PELVIS WITH CONTRAST TECHNIQUE: Multidetector CT imaging of the chest, abdomen and pelvis was performed following the standard protocol during bolus administration of intravenous contrast. CONTRAST:  OMNIPAQUE IOHEXOL 300 MG/ML  SOLN COMPARISON:  February 29, 2020 FINDINGS: CT CHEST FINDINGS Cardiovascular: Heart is normal in size. Predominately LEFT-sided coronary artery atherosclerotic calcifications. Calcifications of the aortic valve. Soft and calcified plaque throughout the aorta. Mediastinum/Nodes: Thyroid is unremarkable. No axillary or mediastinal adenopathy. Lungs/Pleura: Small LEFT pleural effusion, increased in comparison to prior. Biapical irregular opacities likely scar. Centrilobular emphysema, moderate. RIGHT lower lobe pulmonary nodule measures 6 x 5 mm (series 2, image 24). RIGHT middle lobe pulmonary nodule measures 5 mm (series 4, image 98). LEFT basilar atelectasis. Musculoskeletal: Degenerative changes of the thoracic spine. CT ABDOMEN PELVIS FINDINGS Hepatobiliary: Focal fatty deposition adjacent to the falciform ligament. Portal vein is patent. Gallbladder is mildly distended. Trace adjacent pericholecystic fluid, likely reactive. Pancreas: Majority of the pancreas is hypoattenuating  with minimal normally enhancing pancreas at the uncinate process and a small focus at the tail. There is exuberant adjacent fat stranding and a small amount of adjacent fluid. No definitive rim enhancing organized collection. No focal drainable fluid collection at this point in time. No definitive evidence of pseudoaneurysm formation at this point in time. Spleen: Unremarkable. Splenic vein is patent. Splenic artery is patent. Adrenals/Urinary Tract: Adrenal glands are unremarkable. Kidneys enhance symmetrically. No hydronephrosis. Bladder is unremarkable. Stomach/Bowel: No evidence of bowel obstruction. Mild prominence of the small bowel is likely due to mild ileus from  inflammatory changes. Appendix is normal. Mild bowel wall thickening of the transverse colon and splenic flexure, likely reactive in etiology. Vascular/Lymphatic: Revisualization of an infrarenal abdominal aortic aneurysm measuring up to 31 mm. Severe soft and calcified plaque throughout the aorta. Reproductive: Fibroid uterus.  Prominent parametrial vessels. Other: Small volume free fluid throughout the abdomen with evolving post inflammatory changes along the pericolic gutters. Musculoskeletal: Degenerative changes of the lumbar spine. Levocurvature of the lumbar spine. IMPRESSION: 1. Revisualization of necrotic pancreatitis with exuberant adjacent fat stranding and scattered free fluid throughout the abdomen. No focal drainable fluid collection or pseudoaneurysm at this point in time. 2. Small LEFT pleural effusion, increased in comparison to prior. 3. Likely reactive mild ileus and bowel wall thickening of the colon. No evidence of bowel obstruction. 4. Small pulmonary nodules measuring up to 6 mm. Recommend follow-up CT in 1 year to assess for stability. 5. Revisualization of infrarenal abdominal aortic aneurysm measuring up to 31 mm. Recommend follow-up every 3 years. This recommendation follows ACR consensus guidelines: White Paper of the ACR  Incidental Findings Committee II on Vascular Findings. J Am Coll Radiol 2013; 10:789-794. Aortic Atherosclerosis (ICD10-I70.0) and Emphysema (ICD10-J43.9). Electronically Signed   By: Meda KlinefelterStephanie  Peacock MD   On: 03/08/2020 15:48   Medications:  I have reviewed the patient's current medications. Prior to Admission:  Medications Prior to Admission  Medication Sig Dispense Refill Last Dose  . atenolol (TENORMIN) 50 MG tablet Take 1 tablet (50 mg total) by mouth daily. 90 tablet 1 02/28/2020 at Unknown time  . cimetidine (TAGAMET) 200 MG tablet Take 200 mg by mouth 2 (two) times daily.   Unknown at Unknown  . co-enzyme Q-10 30 MG capsule Take 30 mg by mouth 3 (three) times daily.   02/28/2020 at Unknown time  . cyclobenzaprine (FLEXERIL) 10 MG tablet Take 10 mg by mouth 3 (three) times daily as needed for muscle spasms.   PRN at PRN  . Dextromethorphan-guaiFENesin (CORICIDIN HBP CONGESTION/COUGH) 10-200 MG CAPS Take 2 capsules by mouth daily as needed.   PRN at PRN  . lisinopril (ZESTRIL) 10 MG tablet Take 1 tablet (10 mg total) by mouth daily. 90 tablet 1 02/28/2020 at Unknown time  . omega-3 acid ethyl esters (LOVAZA) 1 G capsule Take by mouth 2 (two) times daily.   02/28/2020 at Unknown time  . OVER THE COUNTER MEDICATION Take 3 capsules by mouth daily. VISICLEAR - [facility] Formula   Unknown at Unknown  . Phenylephrine-DM-GG (ROBITUSSIN MULTI-SYMPTOM MAX PO) Take 10 mLs by mouth every 4 (four) hours as needed.   PRN at PRN  . rosuvastatin (CRESTOR) 10 MG tablet TAKE 1 TABLET BY MOUTH FIVE DAYS PER WEEK 105 tablet 1 02/28/2020 at Unknown time  . diphenhydrAMINE (BENADRYL) 25 mg capsule Take 25 mg by mouth Nightly.  (Patient not taking: Reported on 02/29/2020)   Not Taking at Unknown time  . hydrocortisone 2.5 % cream Apply topically. (Patient not taking: Reported on 02/29/2020)   Not Taking at Unknown time  . milk thistle 175 MG tablet Take 175 mg by mouth daily. (Patient not taking: Reported on  02/29/2020)   Not Taking at Unknown time  . Specialty Vitamins Products (ECHINACEA C COMPLETE PO) Take by mouth. (Patient not taking: Reported on 02/29/2020)   Not Taking at Unknown time   Scheduled: . acidophilus  1 capsule Oral Daily  . feeding supplement  1 Container Oral TID BM  . fluticasone  2 spray Each Nare Daily  .  heparin injection (subcutaneous)  5,000 Units Subcutaneous Q8H  . insulin aspart  0-20 Units Subcutaneous TID WC  . insulin aspart  0-5 Units Subcutaneous QHS  . insulin detemir  6 Units Subcutaneous Daily  . multivitamin with minerals  1 tablet Oral Daily  . pantoprazole (PROTONIX) IV  40 mg Intravenous Q24H  . simethicone  80 mg Oral Daily   Continuous: . dextrose 5 % and 0.45% NaCl 50 mL/hr at 03/10/20 0912  . meropenem (MERREM) IV 1 g (03/10/20 1020)   QVZ:[id number], HYDROmorphone (DILAUDID) injection, polyethylene glycol, senna-docusate Anti-infectives (From admission, onward)   Start     Dose/Rate Route Frequency Ordered Stop   03/09/20 0915  piperacillin-tazobactam (ZOSYN) IVPB 3.375 g  Status:  Discontinued       Note to Pharmacy: Dosing per phamarcy   3.375 g 12.5 mL/hr over 240 Minutes Intravenous Every 8 hours 03/09/20 0816 03/09/20 0823   03/09/20 0915  meropenem (MERREM) 1 g in sodium chloride 0.9 % 100 mL IVPB       Note to Pharmacy: Dosing per pharmacy   1 g 200 mL/hr over 30 Minutes Intravenous Every 12 hours 03/09/20 0823     03/01/20 2000  piperacillin-tazobactam (ZOSYN) IVPB 3.375 g  Status:  Discontinued        3.375 g 12.5 mL/hr over 240 Minutes Intravenous Every 8 hours 03/01/20 1544 03/06/20 1407   03/01/20 0100  metroNIDAZOLE (FLAGYL) IVPB 500 mg  Status:  Discontinued        500 mg 100 mL/hr over 60 Minutes Intravenous Every 8 hours 03/01/20 0003 03/01/20 1543   03/01/20 0100  ceFEPIme (MAXIPIME) 2 g in sodium chloride 0.9 % 100 mL IVPB  Status:  Discontinued        2 g 200 mL/hr over 30 Minutes Intravenous Every 12  hours 03/01/20 0013 03/01/20 1543   02/29/20 0330  ceFEPIme (MAXIPIME) 2 g in sodium chloride 0.9 % 100 mL IVPB        2 g 200 mL/hr over 30 Minutes Intravenous  Once 02/29/20 0319 02/29/20 0435   02/29/20 0330  metroNIDAZOLE (FLAGYL) IVPB 500 mg        500 mg 100 mL/hr over 60 Minutes Intravenous  Once 02/29/20 0319 02/29/20 0505     Scheduled Meds: . acidophilus  1 capsule Oral Daily  . feeding supplement  1 Container Oral TID BM  . fluticasone  2 spray Each Nare Daily  . heparin injection (subcutaneous)  5,000 Units Subcutaneous Q8H  . insulin aspart  0-20 Units Subcutaneous TID WC  . insulin aspart  0-5 Units Subcutaneous QHS  . insulin detemir  6 Units Subcutaneous Daily  . multivitamin with minerals  1 tablet Oral Daily  . pantoprazole (PROTONIX) IV  40 mg Intravenous Q24H  . simethicone  80 mg Oral Daily   Continuous Infusions: . dextrose 5 % and 0.45% NaCl 50 mL/hr at 03/10/20 0912  . meropenem (MERREM) IV 1 g (03/10/20 1020)   PRN Meds:.HYDROcodone-acetaminophen, HYDROmorphone (DILAUDID) injection, polyethylene glycol, senna-docusate   Assessment: Active Problems:   Acute pancreatic necrosis   Seizure (HCC)   Necrotizing pancreatitis   Plan: Acute necrotizing pancreatitis with worsening leukocytosis after discontinuation of antibiotics Continue IV fluids and clear liquids today Agree with ID to rule out infected necrotizing pancreatitis.  Patient has ascites on imaging, will be undergoing diagnostic paracentesis to evaluate for infected necrosis.  Also, recommend to check cytology, amylase and lipase levels to evaluate for any ruptured pancreatic duct  Leukocytosis is improving with meropenem Blood cultures from 3/7,NGTD   LOS: 10 days   Demiya Magno 03/10/2020, 2:09 PM

## 2020-03-10 NOTE — Progress Notes (Signed)
PROGRESS NOTE    Holly Navarro  WUJ:[id number] DOB: Sep 05, 1945 DOA: 02/29/2020 PCP: Tarri Fuller, FNP    Brief Narrative:  Holly Navarro a 75 y.o.femalewith history of hypertension, hyperlipidemia, gastric ulcer [facility] diffuse severe sharp abdominal [facility]. The discomfort began Around 10 PM last nightand was associated with onset of nausea and cramps.She did vomit and became diaphoretic after emesis. The pain did radiate across her entire abdomen and to her back. There was no associated diarrhea or lose stool and no hematochezia or hematemesis. She was found to be hypothermic and hypertensive upon her arrival she was warmed and started on fluids. Her evaluation returned a lactic acid of 6. There was a leukocytosis in excess of 20. A CT revealed necrotizing pancreatitis with a corroborating lipase. The case was discussed with our local surgeon who felt she may be best served in transfer. The case was then d/w with [facility] Intensivist who explained there were no beds and without acute intervention planned, she would likely need to wait and be stabilized at [facility].  Week Summary: She was admitted to the ICU service on 2/26.  On 3/2 she was transferred to [facility] service. 3/3-febrile Tmax 100.2, GI consulted this am 3/4-spiked fever again tmax 100.5.  She was complaining of abdominal distention but improved with simethicone. Eating food today and tolerating 3/5-c/o gas pain again asking for simethicone which helped her before. Tolerating po intake 3/6-her abdominal extension improved with simethicone however still with fullness and abdominal pain.  Lasix 20 mg iv  given yesterday per nursing requiring less O2 and doing better today 3/7- feels full quickly, still with abd pain. CT scan reviewed , d/w GI. +BM. WBC continues to increase Meropenem started.ID consulted 3/8-plan for paracentesis as IR cannot aspirate fluid around pancrease. GI in  agreement.  Consultants:   PCCM, neurology, GI, ID  Procedures:   2/26: CXR>>The lungs are symmetrically well expanded. Benign calcified granuloma within the right mid lung zone. Nodular density at the left lung base likely represents a confluence of vascular and osseous shadow. The lungs are otherwise clear. No pneumothorax or pleural effusion. Cardiac size within normal limits. No acute bone Abnormality. 2/26: KUB>>Normal abdominal gas pattern. Inferior pelvis excluded from view. No definite gross free intraperitoneal gas. No organomegaly. Vascular calcifications are seen within the abdominal aorta. Osseous structures are age-appropriate. 2/26: CT Abdomen & Pelvis>>1. Severe Acute Pancreatitis, with subtotal associated Pancreatic Necrosis. 2. Moderate volume free fluid and inflammation throughout the upper abdomen. No organized or rim enhancing fluid collection at this time. No vascular occlusion or other complicating features. 3. Infrarenal abdominal aortic aneurysm measuring 31 mm. Recommend follow-up Ultrasound every 3 years. This recommendation follows ACR consensus guidelines: White Paper of the ACR Incidental Findings Committee II on Vascular Findings. J Am Coll Radiol 2013; 95:621-308. 4. Aortic Atherosclerosis (ICD10-I70.0) and Emphysema (ICD10-J43.9). 2/26: CT Head>>No acute intracranial abnormality. Normal examination. 2/26: US Abdomen (RUQ)>>1. Trace volume of ascites adjacent to the liver. 2. Otherwise, unremarkable examination. Specifically, no gallstones, signs of acute cholecystitis, or evidence of biliary tract Dilatation. 2/28: Echocardiogram>>1. Left ventricular ejection fraction, by estimation, is 65 to 70%. The  left ventricle has normal function. The left ventricle has no regional  wall motion abnormalities. Left ventricular diastolic parameters are  consistent with Grade I diastolic  dysfunction (impaired relaxation).  2. Right ventricular systolic  function is normal. The right ventricular  size is normal. Tricuspid regurgitation signal is inadequate for assessing  PA pressure.  3. The mitral valve is normal in structure. No evidence of mitral valve  regurgitation. No evidence of mitral stenosis.  4. The aortic valve is normal in structure. Aortic valve regurgitation is  not visualized. Mild aortic valve stenosis. Aortic valve area, by VTI  measures 2.07 cm. Aortic valve mean gradient measures 6.0 mmHg.  5. The inferior vena cava is normal in size with greater than 50%  respiratory variability, suggesting right atrial pressure of 3 mmHg 2/28:CXR>>Normal heart size, mediastinal contours, and pulmonary vascularity. LEFT pleural effusion new since previous exam with LEFT basilar atelectasis.Subsegmental atelectasis at RIGHT base as well. Upper lungs clear. No pneumothorax. Bones demineralized.     Antimicrobials:  2/26: SARS-CoV-2 PCR>> negative 2/26: Influenza A&B PCR>> negative 2/26: Blood culture x2>> no growth to date 2/26: MRSA PCR>> negative  Cefepime 2/26>> 2/27 Flagyl 2/26>> 2/27 Zosyn 2/27>>3/5 Meropenem 3/7>>>  Subjective: Patient's abdominal pain since n.p.o. has decreased.  No nausea vomiting.  Has no new complaints.  Objective: Vitals:   03/09/20 1538 03/09/20 2134 03/09/20 2248 03/10/20 0415  BP: (!) 132/57 139/68 119/60 121/62  Pulse: (!) 110 (!) 116 (!) 119 92  Resp: 20 (!) 23 20 (!) 24  Temp: 98 F (36.7 C) 98.9 F (37.2 C) 98.3 F (36.8 C) 97.9 F (36.6 C)  TempSrc:   Oral   SpO2: 94% 94% 91% 95%  Weight:    69.1 kg  Height:        Intake/Output Summary (Last 24 hours) at 03/10/2020 0808 Last data filed at 03/10/2020 0333 Gross per 24 hour  Intake 1775.1 ml  Output 600 ml  Net 1175.1 ml   Filed Weights   03/07/20 0502 03/09/20 0552 03/10/20 0415  Weight: 71.5 kg 70.2 kg 69.1 kg    Examination: Calm, comfortable CTA, decreased breath sounds at bases no wheezing Regular S1-S2 no  gallops Soft tender to palpation at epigastric area, positive bowel sounds No edema Noted oriented x3 grossly intact   Data Reviewed: I have personally reviewed following labs and imaging studies  CBC: Recent Labs  Lab 03/04/20 0525 03/05/20 0749 03/07/20 0454 03/08/20 0445 03/09/20 0449 03/10/20 0628  WBC 12.8* 10.9* 14.8* 19.2* 23.8* 17.9*  NEUTROABS 9.0* 7.1  --   --   --   --   HGB 11.6* 10.2* 10.0* 10.2* 10.6* 10.5*  HCT 35.1* 31.9* 30.7* 30.4* 31.6* 31.6*  MCV 96.2 97.6 95.9 93.8 93.5 94.0  PLT 131* 148* 254 300 439* 506*   Basic Metabolic Panel: Recent Labs  Lab 03/04/20 0525 03/05/20 0749 03/06/20 0527 03/07/20 0454 03/08/20 0445 03/09/20 0449 03/10/20 0628  NA 138 135  --  138 138 136  --   K 3.4* 3.6  --  2.8* 3.6 3.4* 3.6  CL 107 104  --  104 104 101  --   CO2 25 24  --  27 26 25   --   GLUCOSE 126* 139*  --  170* 139* 140*  --   BUN 18 15  --  9 7* 8  --   CREATININE 1.16* 1.15*  --  1.06* 0.93 1.06*  --   CALCIUM 8.1* 7.9*  --  7.9* 8.3* 8.3*  --   MG 2.0 2.1 2.3  --   --   --   --   PHOS 2.4* 2.4* 2.0* 2.1*  --  3.2  --    GFR: Estimated Creatinine Clearance: 43.6 mL/min (A) (by C-G formula based on SCr of 1.06 mg/dL (  H)). Liver Function Tests: Recent Labs  Lab 03/04/20 0525 03/05/20 0749 03/09/20 0449 03/10/20 0628  AST 26 23  --  26  ALT 16 15  --  28  ALKPHOS 50 68  --  130*  BILITOT 1.5* 1.5*  --  1.0  PROT 5.7* 5.0*  --  6.2*  ALBUMIN 2.5* 2.2* 2.5* 2.3*   No results for input(s): LIPASE, AMYLASE in the last 168 hours. No results for input(s): AMMONIA in the last 168 hours. Coagulation Profile: No results for input(s): INR, PROTIME in the last 168 hours. Cardiac Enzymes: No results for input(s): CKTOTAL, CKMB, CKMBINDEX, TROPONINI in the last 168 hours. BNP (last 3 results) No results for input(s): PROBNP in the last 8760 hours. HbA1C: No results for input(s): HGBA1C in the last 72 hours. CBG: Recent Labs  Lab 03/09/20 0753  03/09/20 1146 03/09/20 1705 03/09/20 2136 03/10/20 0742  GLUCAP 174* 131* 168* 122* 170*   Lipid Profile: No results for input(s): CHOL, HDL, LDLCALC, TRIG, CHOLHDL, LDLDIRECT in the last 72 hours. Thyroid Function Tests: No results for input(s): TSH, T4TOTAL, FREET4, T3FREE, THYROIDAB in the last 72 hours. Anemia Panel: No results for input(s): VITAMINB12, FOLATE, FERRITIN, TIBC, IRON, RETICCTPCT in the last 72 hours. Sepsis Labs: Recent Labs  Lab 03/04/20 0525  PROCALCITON 1.32    Recent Results (from the past 240 hour(s))  MRSA PCR Screening     Status: None   Collection Time: 02/29/20  9:45 AM   Specimen: Nasopharyngeal  Result Value Ref Range Status   MRSA by PCR NEGATIVE NEGATIVE Final    Comment:        The GeneXpert MRSA Assay (FDA approved for NASAL specimens only), is one component of a comprehensive MRSA colonization surveillance program. It is not intended to diagnose MRSA infection nor to guide or monitor treatment for MRSA infections. Performed at [facility], [address], [place], [place] [address]   Urine Culture     Status: None   Collection Time: 03/05/20  9:16 AM   Specimen: Urine, Catheterized  Result Value Ref Range Status   Specimen Description   Final    URINE, CATHETERIZED Performed at [facility], [address] [place], [place] [address]    Special Requests   Final    NONE Performed at [facility], [address], [place], [place] [address]    Culture   Final    NO GROWTH Performed at [facility], [address] [place], [place] [address]    Report Status 03/06/2020 FINAL  Final  CULTURE, BLOOD (ROUTINE X 2) w Reflex to ID Panel     Status: None   Collection Time: 03/05/20 10:02 AM   Specimen: BLOOD  Result Value Ref Range Status   Specimen Description BLOOD LEFT [facility]  Final   Special Requests   Final    BOTTLES DRAWN AEROBIC AND ANAEROBIC Blood Culture adequate volume   Culture   Final     NO GROWTH 5 DAYS Performed at [facility], [address] [place], [place] [address]    Report Status 03/10/2020 FINAL  Final  CULTURE, BLOOD (ROUTINE X 2) w Reflex to ID Panel     Status: None   Collection Time: 03/05/20 10:03 AM   Specimen: BLOOD  Result Value Ref Range Status   Specimen Description BLOOD [facility]  Final   Special Requests   Final    BOTTLES DRAWN AEROBIC AND ANAEROBIC Blood Culture adequate volume  Culture   Final    NO GROWTH 5 DAYS Performed at [facility], [address] [place], [place] [address]    Report Status 03/10/2020 FINAL  Final  CULTURE, BLOOD (ROUTINE X 2) w Reflex to ID Panel     Status: None (Preliminary result)   Collection Time: 03/09/20 12:22 PM   Specimen: BLOOD  Result Value Ref Range Status   Specimen Description BLOOD LEFT WRIST  Final   Special Requests   Final    BOTTLES DRAWN AEROBIC AND ANAEROBIC Blood Culture adequate volume   Culture   Final    NO GROWTH < 24 HOURS Performed at [facility], [address] [place], [place] [address]    Report Status PENDING  Incomplete  CULTURE, BLOOD (ROUTINE X 2) w Reflex to ID Panel     Status: None (Preliminary result)   Collection Time: 03/09/20 12:24 PM   Specimen: BLOOD  Result Value Ref Range Status   Specimen Description BLOOD BLOOD RIGHT HAND  Final   Special Requests   Final    BOTTLES DRAWN AEROBIC AND ANAEROBIC Blood Culture adequate volume   Culture   Final    NO GROWTH < 24 HOURS Performed at [facility], [address] [place], [place] [address]    Report Status PENDING  Incomplete         Radiology Studies: CT CHEST ABDOMEN PELVIS W CONTRAST  Result Date: 03/08/2020 CLINICAL DATA:  Abdominal pain EXAM: CT CHEST, ABDOMEN, AND PELVIS WITH CONTRAST TECHNIQUE: Multidetector CT imaging of the chest, abdomen and pelvis was performed following the standard protocol during bolus administration of intravenous contrast. CONTRAST:   100mL OMNIPAQUE IOHEXOL 300 MG/ML  SOLN COMPARISON:  February 29, 2020 FINDINGS: CT CHEST FINDINGS Cardiovascular: Heart is normal in size. Predominately LEFT-sided coronary artery atherosclerotic calcifications. Calcifications of the aortic valve. Soft and calcified plaque throughout the aorta. Mediastinum/Nodes: Thyroid is unremarkable. No axillary or mediastinal adenopathy. Lungs/Pleura: Small LEFT pleural effusion, increased in comparison to prior. Biapical irregular opacities likely scar. Centrilobular emphysema, moderate. RIGHT lower lobe pulmonary nodule measures 6 x 5 mm (series 2, image 24). RIGHT middle lobe pulmonary nodule measures 5 mm (series 4, image 98). LEFT basilar atelectasis. Musculoskeletal: Degenerative changes of the thoracic spine. CT ABDOMEN PELVIS FINDINGS Hepatobiliary: Focal fatty deposition adjacent to the falciform ligament. Portal vein is patent. Gallbladder is mildly distended. Trace adjacent pericholecystic fluid, likely reactive. Pancreas: Majority of the pancreas is hypoattenuating with minimal normally enhancing pancreas at the uncinate process and a small focus at the tail. There is exuberant adjacent fat stranding and a small amount of adjacent fluid. No definitive rim enhancing organized collection. No focal drainable fluid collection at this point in time. No definitive evidence of pseudoaneurysm formation at this point in time. Spleen: Unremarkable. Splenic vein is patent. Splenic artery is patent. Adrenals/Urinary Tract: Adrenal glands are unremarkable. Kidneys enhance symmetrically. No hydronephrosis. Bladder is unremarkable. Stomach/Bowel: No evidence of bowel obstruction. Mild prominence of the small bowel is likely due to mild ileus from inflammatory changes. Appendix is normal. Mild bowel wall thickening of the transverse colon and splenic flexure, likely reactive in etiology. Vascular/Lymphatic: Revisualization of an infrarenal abdominal aortic aneurysm measuring up to  31 mm. Severe soft and calcified plaque throughout the aorta. Reproductive: Fibroid uterus.  Prominent parametrial vessels. Other: Small volume free fluid throughout the abdomen with evolving post inflammatory changes along the pericolic gutters. Musculoskeletal: Degenerative changes of the lumbar spine. Levocurvature of the lumbar  spine. IMPRESSION: 1. Revisualization of necrotic pancreatitis with exuberant adjacent fat stranding and scattered free fluid throughout the abdomen. No focal drainable fluid collection or pseudoaneurysm at this point in time. 2. Small LEFT pleural effusion, increased in comparison to prior. 3. Likely reactive mild ileus and bowel wall thickening of the colon. No evidence of bowel obstruction. 4. Small pulmonary nodules measuring up to 6 mm. Recommend follow-up CT in 1 year to assess for stability. 5. Revisualization of infrarenal abdominal aortic aneurysm measuring up to 31 mm. Recommend follow-up every 3 years. This recommendation follows ACR consensus guidelines: White Paper of the ACR Incidental Findings Committee II on Vascular Findings. J Am Coll Radiol 2013; 10:789-794. Aortic Atherosclerosis (ICD10-I70.0) and Emphysema (ICD10-J43.9). Electronically Signed   By: Meda Klinefelter MD   On: 03/08/2020 15:48        Scheduled Meds: . acidophilus  1 capsule Oral Daily  . feeding supplement  1 Container Oral TID BM  . fluticasone  2 spray Each Nare Daily  . heparin injection (subcutaneous)  5,000 Units Subcutaneous Q8H  . insulin aspart  0-20 Units Subcutaneous TID WC  . insulin aspart  0-5 Units Subcutaneous QHS  . insulin detemir  6 Units Subcutaneous Daily  . multivitamin with minerals  1 tablet Oral Daily  . pantoprazole (PROTONIX) IV  40 mg Intravenous Q24H  . simethicone  80 mg Oral Daily   Continuous Infusions: . dextrose 5 % and 0.45% NaCl 50 mL/hr at 03/10/20 0333  . meropenem (MERREM) IV Stopped (03/09/20 2221)    Assessment & Plan:   Active  Problems:   Acute pancreatic necrosis   Seizure (HCC)   Necrotizing pancreatitis   Acute Necrotizing Pancreatitis -No role for surgery or perc drain at this time, there is not a discrete collection that would benefit -There is no obvious intervention required at the level of the gallbladder or duct -Will maintain contact with [facility] for potential need of IR if come to that point Procalcitonin is decreasing  Lactic acid normalized  Ethanol level less than 10, triglyceride 169  Right upper quadrant abdominal ultrasound on 2/26 without gallstones, acute cholecystitis, or biliary tract dilatation  3/3-GI consulted since pt febrile and has pain GIs input was appreciated-clear and apparently is improving.  If patient's symptoms increase or patient starts to date.  Recommend repeating CT scan of the abdomen but for now hold off on CT scan. 3/5- zosyn dc/'d yesterday as she has received it for few day . Had fever on it . Will observe and trend fever off abx. 3/6-afebrile, but leukocytosis trending up at 19.2 Will obtain ct chest ab/pel.  With abd pain and trending up of wbc.  3/7- ct c/a/p- see report. Based on findings, GI Dr. Allegra Lai stated place dopoff to rest pancrease. Pt refused, pt kept NPO.  3/8-ID was consulted, input was appreciated,recommended aspirate pancreas/ascites Was started on meropenem on 3/7 since worsening of leukocytosis to 23.8, has come down to 17.9 today Remains afebrile IR consulted and plan for paracentesis today with fluid cultures cytology amylase lipase sent. Repeat blood cultures are pending we will follow up On clears to rest bowel  Plan: F/u paracentesis fluid cx, cytology, ID , GI Continue iv abx    Fever- possibly atelectasis? Doubt pna.  Wbc up Zosyn dc'd on 3/4 to see how pt does Leukocytosis worsening but aferible of Zosyn Repeat ct -see report-revisualizing necrotic pancreatitis with exuberant adjacent fat stranding and scattered free fluid  throughout the abdomen no  focal drainable fluid collection or pseudoaneurysm Started on meropenem 3/7 since leukocytosis was 23.8, repeat bcx done, ID was consulted. On meropenem leukocytosis improving  23.8>>17.9 ID recommended aspirate pancreas/ascites and sent for cultures  Plan: F/u paracentesis fluid cx, cytology, ID , GI Continue iv abx       Acute Hypoxic Respiratory Failure in the setting of Atelectasis and Left Pleural Effusion -Supplemental O2 as needed to maintain O2 sats >92% -Follow intermittent CXR and ABG as needed -CXR on 2/28 with new LEFT pleural effusion with bibasilar atelectasis greater on LEFT 3/4 -pleural effusion due to pancreatitis Echo 02/23/2020 with LVEF 65 to 70%, grade 1 diastolic dysfunction 3/6-mildly still fluid overloaded 3/7-ct chest -Small LEFT pleural effusion, increased in comparison to prior bnp was elevated, received 2 doses of lasix 20mg  iv , responded well 3/8-doing better, continue weaning down 02  Plan: Monitor volume status, iv lasix prn  Wean down 02     Hypokalemia-  Replaced and stable Monitor levels  Small pulmonary nodules- found on ct chest 03/08/20 Needs f/u CT in one year to assess for stability  Acute Metabolic Encephalopathy>>resolved Generalized Tonic-Clonic Seizure Has first-time seizure in the ED.  This is provoked episodes in the setting of significant pancreatitis with lactic acidosis. EEG -received a mild diffuse encephalopathy, nonspecific etiology.  Possibly toxic metabolic etiology.  No seizure or definite epileptiform discharges were seen  Per neurology keppra was d/c'd No indication for AED at this time Neurology signed off   infrarenal abdominal aortic aneurysm  Measuring up to 31 mm. Recommend follow-up every 3 years.    Acute Kidney Injury Resolved Avoid nephrotoxic Monitor levels if giving Lasix   Hyperglycemia A1c 5.8 BG levels stable continue lantus 6units riss         DVT  prophylaxis: Heparin subcu Code Status: DNR Family Communication: none at bedside  Status is: Inpatient  Remains inpatient appropriate because:Inpatient level of care appropriate due to severity of illness   Dispo: The patient is from: Home              Anticipated d/c is to: home with home health              Patient currently is not medically stable to d/c.   Difficult to place patient No  Likely dc in >3 days , still with pain,  w/u pending, on iv abx. Getting paracentesis by IR           LOS: 10 days   Time spent: 05/08/20  with more than 50% on COC    , MD [facility] Pager [phone]  If 7PM-7AM, please contact night-coverage 03/10/2020, 8:08 AM

## 2020-03-10 NOTE — Plan of Care (Signed)
Patient doing well today.  Pain is better today than yesterday.  Went for fluid aspiration, but there was not enough fluid to drain.  Started back on  clear liquid diet and tolerating well.

## 2020-03-10 NOTE — Progress Notes (Signed)
Date of Admission:  02/29/2020      ID: Holly Navarro is a 75 y.o. female  Active Problems:   Acute pancreatic necrosis   Seizure (HCC)   Necrotizing pancreatitis    Subjective: Patient having some abdominal distention after taking some liquids Says simethicone helps No nausea or vomiting Son at bedside  Medications:  . acidophilus  1 capsule Oral Daily  . feeding supplement  1 Container Oral TID BM  . fluticasone  2 spray Each Nare Daily  . heparin injection (subcutaneous)  5,000 Units Subcutaneous Q8H  . insulin aspart  0-20 Units Subcutaneous TID WC  . insulin aspart  0-5 Units Subcutaneous QHS  . insulin detemir  6 Units Subcutaneous Daily  . multivitamin with minerals  1 tablet Oral Daily  . pantoprazole (PROTONIX) IV  40 mg Intravenous Q24H    Objective: Vital signs in last 24 hours: Temp:  [97.9 F (36.6 C)-99.9 F (37.7 C)] 98.6 F (37 C) (03/08 1946) Pulse Rate:  [92-119] 104 (03/08 1946) Resp:  [16-24] 20 (03/08 1946) BP: (119-140)/(60-73) 140/73 (03/08 1946) SpO2:  [91 %-97 %] 97 % (03/08 1946) Weight:  [69.1 kg] 69.1 kg (03/08 0415)  PHYSICAL EXAM:  General: Alert, cooperative, no distress, appears stated age.  Lungs: Clear to auscultation bilaterally. No Wheezing or Rhonchi. No rales. Heart: Regular rate and rhythm, no murmur, rub or gallop. Abdomen: Soft,  distended.  Some tenderness extremities: atraumatic, no cyanosis. No edema. No clubbing Skin: No rashes or lesions. Or bruising Lymph: Cervical, supraclavicular normal. Neurologic: Grossly non-focal  Lab Results Recent Labs    03/08/20 0445 03/09/20 0449 03/10/20 0628  WBC 19.2* 23.8* 17.9*  HGB 10.2* 10.6* 10.5*  HCT 30.4* 31.6* 31.6*  NA 138 136  --   K 3.6 3.4* 3.6  CL 104 101  --   CO2 26 25  --   BUN 7* 8  --   CREATININE 0.93 1.06*  --    Liver Panel Recent Labs    03/09/20 0449 03/10/20 0628  PROT  --  6.2*  ALBUMIN 2.5* 2.3*  AST  --  26  ALT  --  28  ALKPHOS  --   130*  BILITOT  --  1.0  BILIDIR  --  0.2  IBILI  --  0.8   Sedimentation Rate No results for input(s): ESRSEDRATE in the last 72 hours. C-Reactive Protein No results for input(s): CRP in the last 72 hours.  Microbiology:  Studies/Results: [place] ASCITES (ABDOMEN LIMITED)  Result Date: 03/10/2020 CLINICAL DATA:  75 year old woman with pancreatitis presents today measure radiology for paracentesis. EXAM: LIMITED ABDOMEN ULTRASOUND FOR ASCITES TECHNIQUE: Limited ultrasound survey for ascites was performed in all four abdominal quadrants. COMPARISON:  None. FINDINGS: Sonographic evaluation of the abdomen demonstrates no ascites. Peripancreatic inflammatory changes are seen. No discrete peripancreatic fluid collection identified on ultrasound. Small left pleural effusion also seen. IMPRESSION: No drainable abdominal ascites. Electronically Signed   By: Acquanetta Belling M.D.   On: 03/10/2020 14:17     Assessment/Plan: Acute pancreatitis.  MRI reported as necrotizing pancreatitis.  Initially had leukocytosis and with antibiotics it resolved and antibiotic was discontinued after 6 days leukocytosis is going up again.  Very likely there is a correlation with what she is eating and worsening of leukocytosis and pancreatitis.  We asked IR to aspirate the necrotic area or the sciatic fluid.  Ultrasound of the abdomen was done and there was no obvious fluid collection to aspirate. Patient  is currently on meropenem.  We will give for a few days until the WBC count normalizes and then may be switching to either Unasyn or discontinue antibiotics.   GI following her and they had recommended duodenal tube feeding but patient refused that.  Paroxysmal A. fib on metoprolol  Anemia  AKI is resolved  Discussed the management with the patient, care team and the son  AKI resolved  Anemia

## 2020-03-10 NOTE — Progress Notes (Signed)
Patient presented to [facility] [place] department for an image-guided paracentesis. Limited ultrasound imaging did not reveal any abdominal fluid amenable to percutaneous aspiration. A small left pleural effusion was identified but there was not enough of a safe window to access. Images have been saved in Epic.   Dr. Bryn Gulling and the ordering providers were notified via Epic Chat.   These findings were also discussed with the patient and the female family member that accompanied her to ultrasound. They verbalized understanding of our findings.   Please re-consult IR if the patient's clinical pictures changes or future imaging shows an increase in fluid.   Alwyn Ren, [place] [phone] 03/10/2020, 2:09 PM

## 2020-03-11 DIAGNOSIS — K8591 Acute pancreatitis with uninfected necrosis, unspecified: Secondary | ICD-10-CM | POA: Diagnosis not present

## 2020-03-11 DIAGNOSIS — E876 Hypokalemia: Secondary | ICD-10-CM | POA: Diagnosis not present

## 2020-03-11 LAB — CBC
HCT: 31 % — ABNORMAL LOW (ref 36.0–46.0)
Hemoglobin: 10.1 g/dL — ABNORMAL LOW (ref 12.0–15.0)
MCH: 30.9 pg (ref 26.0–34.0)
MCHC: 32.6 g/dL (ref 30.0–36.0)
MCV: 94.8 fL (ref 80.0–100.0)
Platelets: 524 10*3/uL — ABNORMAL HIGH (ref 150–400)
RBC: 3.27 MIL/uL — ABNORMAL LOW (ref 3.87–5.11)
RDW: 13.2 % (ref 11.5–15.5)
WBC: 14 10*3/uL — ABNORMAL HIGH (ref 4.0–10.5)
nRBC: 0 % (ref 0.0–0.2)

## 2020-03-11 LAB — BASIC METABOLIC PANEL
Anion gap: 6 (ref 5–15)
BUN: 7 mg/dL — ABNORMAL LOW (ref 8–23)
CO2: 26 mmol/L (ref 22–32)
Calcium: 8.2 mg/dL — ABNORMAL LOW (ref 8.9–10.3)
Chloride: 104 mmol/L (ref 98–111)
Creatinine, Ser: 1.04 mg/dL — ABNORMAL HIGH (ref 0.44–1.00)
GFR, Estimated: 56 mL/min — ABNORMAL LOW (ref >60)
Glucose, Bld: 208 mg/dL — ABNORMAL HIGH (ref 70–99)
Potassium: 3.6 mmol/L (ref 3.5–5.1)
Sodium: 136 mmol/L (ref 135–145)

## 2020-03-11 LAB — GLUCOSE, CAPILLARY
Glucose-Capillary: 200 mg/dL — ABNORMAL HIGH (ref 70–99)
Glucose-Capillary: 226 mg/dL — ABNORMAL HIGH (ref 70–99)
Glucose-Capillary: 72 mg/dL (ref 70–99)
Glucose-Capillary: 144 mg/dL — ABNORMAL HIGH (ref 70–99)

## 2020-03-11 MED ORDER — PANCRELIPASE (LIP-PROT-AMYL) 12000-38000 UNITS PO CPEP
72000.0000 [IU] | ORAL_CAPSULE | Freq: Three times a day (TID) | ORAL | Status: DC
  Administered 2020-03-11 – 2020-03-14 (×8): 72000 [IU] via ORAL
  Filled 2020-03-11 (×11): qty 6

## 2020-03-11 MED ORDER — ATENOLOL 25 MG PO TABS
25.0000 mg | ORAL_TABLET | Freq: Every day | ORAL | Status: DC
  Administered 2020-03-11 – 2020-03-12 (×2): 25 mg via ORAL
  Filled 2020-03-11 (×2): qty 1

## 2020-03-11 MED ORDER — CYCLOBENZAPRINE HCL 10 MG PO TABS: 10.0000 mg | ORAL_TABLET | Freq: Three times a day (TID) | ORAL | Status: DC | PRN

## 2020-03-11 MED ORDER — SODIUM CHLORIDE 0.9 % IV SOLN
INTRAVENOUS | Status: DC | PRN
  Administered 2020-03-11: 250 mL via INTRAVENOUS

## 2020-03-11 NOTE — Progress Notes (Signed)
Occupational Therapy Treatment Patient Details Name: Holly Navarro MRN: [id number] DOB: 11-14-1945 Today's Date: 03/11/2020    History of present illness Pt is a 75 y.o. female with history of hypertension, hyperlipidemia, gastric ulcer who presented to [facility] emergency department for sudden onset diffuse severe sharp abdominal pain. MD assessment includes: acute necrotizing pancreatitis, AKI, and acute hypoxic respiratory failure in the setting of atelectasis and left pleural effusion.   OT comments  Holly Navarro was seen for OT treatment on this date. Upon arrival to room pt reclined in bed completing breakfast, initially agreeable to OOB activity then declines. Pt engages in discussion about mobility upon return home - educated on d/c recs, DME recs, home/routines modifications, pet mgmt, HEP (supine BLE ex, IS), and ECS. Pt return demonstrates x3 exercises that she reports she completes in bed 3-5 times/day - cues for proper technique of IS. Pt making good progress toward goals. Pt continues to benefit from skilled OT services to maximize return to PLOF and minimize risk of future falls, injury, caregiver burden, and readmission. Will continue to follow POC. Discharge recommendation remains appropriate.    Follow Up Recommendations  Home health OT;Supervision/Assistance - 24 hour    Equipment Recommendations  3 in 1 bedside commode    Recommendations for Other Services      Precautions / Restrictions Precautions Precautions: Fall Restrictions Weight Bearing Restrictions: No       Mobility Bed Mobility Overal bed mobility: Modified Independent             General bed mobility comments: B rails sup<>long sitting    Transfers                 General transfer comment: Declined to attempt        ADL either performed or assessed with clinical judgement   ADL Overall ADL's : Needs assistance/impaired                                       General ADL  Comments: MOD I self-drinking long sitting in bed               Cognition Arousal/Alertness: Awake/alert Behavior During Therapy: WFL for tasks assessed/performed Overall Cognitive Status: Within Functional Limits for tasks assessed                                          Exercises Exercises: Other exercises Other Exercises Other Exercises: Pt educated re: d/c recs, DME recs, home/routines modifications, HEP (supine BLE ex, IS), and ECS Other Exercises: Self-drinking, sup<>long sitting, IS (2 set x 5 reps each)      General Comments SpO2 high 90s t/o on 1L [place]    Pertinent Vitals/ Pain       Pain Assessment: No/denies pain         Frequency  Min 2X/week        Progress Toward Goals  OT Goals(current goals can now be found in the care plan section)  Progress towards OT goals: Progressing toward goals  Acute Rehab OT Goals Patient Stated Goal: To return home independently OT Goal Formulation: With patient/family Time For Goal Achievement: 03/19/20 Potential to Achieve Goals: Good ADL Goals Pt Will Perform Grooming: with modified independence;standing Pt Will Perform Lower Body Dressing: with modified independence;sit to/from stand Pt  Will Transfer to Toilet: with modified independence;ambulating;regular height toilet  Plan Discharge plan remains appropriate;Frequency remains appropriate       AM-PAC OT "6 Clicks" Daily Activity     Outcome Measure   Help from another person eating meals?: None Help from another person taking care of personal grooming?: A Little Help from another person toileting, which includes using toliet, bedpan, or urinal?: A Little Help from another person bathing (including washing, rinsing, drying)?: A Little Help from another person to put on and taking off regular upper body clothing?: None Help from another person to put on and taking off regular lower body clothing?: A Little 6 Click Score: 20    End of Session     OT Visit Diagnosis: Other abnormalities of gait and mobility (R26.89);Muscle weakness (generalized) (M62.81)   Activity Tolerance Patient tolerated treatment well   Patient Left in bed;with call bell/phone within reach;with bed alarm set   Nurse Communication          Time: 0962-8366 OT Time Calculation (min): 15 min  Charges: OT General Charges $OT Visit: 1 Visit OT Treatments $Self Care/Home Management : 8-22 mins  Kathie Dike, M.S. OTR/L  03/11/20, 9:43 AM  ascom [phone]

## 2020-03-11 NOTE — Progress Notes (Signed)
Physical Therapy Treatment Patient Details Name: Holly Navarro MRN: [id number] DOB: [birth year] Today's Date: 03/11/2020    History of Present Illness Pt is a 75 y.o. female with history of hypertension, hyperlipidemia, gastric ulcer who presented to [facility] emergency department for sudden onset diffuse severe sharp abdominal pain. MD assessment includes: acute necrotizing pancreatitis, AKI, and acute hypoxic respiratory failure in the setting of atelectasis and left pleural effusion.    PT Comments    Pt in bed upon entry, requires heavy encouragement to progress to AMB, pt generally unmotivated to move OOB due to IV, purewick, lack of normal clothing, feeling less than her typical hygiene standards. No assist needed for bed mobility or transfers. Pt tolerantes prolonged time up standing to AMB for ~4-5 minutes, covers ~173ft, has LOB a few times when turning in room, delayed onset awareness, no significant righting responses noted. Pt ultimately feels much better once up moving, assisted to BR with NA to commence with cleanup and donning clothing. Son educated on safe assistance in room. RN made aware.     Follow Up Recommendations  Home health PT;Supervision for mobility/OOB     Equipment Recommendations  Rolling walker with 5" wheels    Recommendations for Other Services       Precautions / Restrictions Precautions Precautions: Fall    Mobility  Bed Mobility Overal bed mobility: Modified Independent                  Transfers Overall transfer level: Modified independent Equipment used: None                Ambulation/Gait Ambulation/Gait assistance: Min guard;Min assist Gait Distance (Feet): 150 Feet Assistive device: IV Pole   Gait velocity: increased   General Gait Details: 3 LOB  2/2 lateral sway while turning, poor awareness, author provides minA for righting assist.   Stairs             Wheelchair Mobility    Modified Rankin (Stroke Patients  Only)       Balance Overall balance assessment: Mild deficits observed, not formally tested Sitting-balance support: No upper extremity supported;Feet supported Sitting balance-Leahy Scale: Good     Standing balance support: Single extremity supported;Bilateral upper extremity supported;During functional activity Standing balance-Leahy Scale: Poor                              Cognition Arousal/Alertness: Awake/alert Behavior During Therapy: WFL for tasks assessed/performed Overall Cognitive Status: Within Functional Limits for tasks assessed                                 General Comments: Pt is A&O x4, limited safety awareness      Exercises      General Comments        Pertinent Vitals/Pain Pain Assessment: No/denies pain    Home Living                      Prior Function            PT Goals (current goals can now be found in the care plan section) Acute Rehab PT Goals Patient Stated Goal: To return home independently PT Goal Formulation: With patient Time For Goal Achievement: 03/19/20 Potential to Achieve Goals: Good Progress towards PT goals: Progressing toward goals    Frequency    Min 2X/week  PT Plan Current plan remains appropriate    Co-evaluation              AM-PAC PT "6 Clicks" Mobility   Outcome Measure  Help needed turning from your back to your side while in a flat bed without using bedrails?: None Help needed moving from lying on your back to sitting on the side of a flat bed without using bedrails?: None Help needed moving to and from a bed to a chair (including a wheelchair)?: A Little Help needed standing up from a chair using your arms (e.g., wheelchair or bedside chair)?: A Little Help needed to walk in hospital room?: A Little Help needed climbing 3-5 steps with a railing? : A Little 6 Click Score: 20    End of Session   Activity Tolerance: Patient tolerated treatment well;No  increased pain Patient left: with nursing/sitter in room;with family/visitor present Nurse Communication: Mobility status PT Visit Diagnosis: Muscle weakness (generalized) (M62.81);Difficulty in walking, not elsewhere classified (R26.2)     Time: 2956-2130 PT Time Calculation (min) (ACUTE ONLY): 35 min  Charges:  $Neuromuscular Re-education: 23-37 mins                     4:56 PM, 03/11/20 Rosamaria Lints, PT, DPT Physical Therapist - [facility]  [phone] (ASCOM)    Davaun Quintela C 03/11/2020, 4:51 PM

## 2020-03-11 NOTE — Progress Notes (Addendum)
Progress Note    [name]  WUX:[id number] DOB: 10-04-1945  DOA: 02/29/2020 PCP: Tarri Fuller, FNP    Brief Narrative:     Medical records reviewed and are as summarized below:  Holly Navarro is an 75 y.o. female with acute necrotizing pancreatitis. She was admitted to the ICU service on 2/26.  On 3/2 she was transferred to [facility] service. Slow to improve.  Being followed by both GI and ID.   Assessment/Plan:   Active Problems:   Acute pancreatic necrosis   Seizure (HCC)   Necrotizing pancreatitis   Acute Necrotizing Pancreatitis -No role for surgery or perc drain at this time, there is not a discrete collection that would benefit -There is no obvious intervention required at the level of the gallbladder or duct  -GI consulted     Fever-  -ID consulted: Initially had leukocytosis and with antibiotics it resolved and antibiotic was discontinued after 6 days leukocytosis is going up again.  Very likely there is a correlation with what she is eating and worsening of leukocytosis and pancreatitis.  We asked IR to aspirate the necrotic area or the sciatic fluid.  Ultrasound of the abdomen was done and there was no obvious fluid collection to aspirate.  Patient is currently on meropenem.  We will give for a few days until the WBC count normalizes and then may be switching to either Unasyn or discontinue antibiotics.  GI following her and they had recommended duodenal tube feeding but patient refused that.   Acute Hypoxic Respiratory Failure in the setting of Atelectasis and Left Pleural Effusion -wean to RA    Hypokalemia-  -replete   Small pulmonary nodules- found on ct chest 03/08/20 Needs f/u CT in one year to assess for stability   Acute Metabolic Encephalopathy>>resolved Generalized Tonic-Clonic Seizure Has first-time seizure in the ED.  This is provoked episodes in the setting of significant pancreatitis with lactic acidosis. EEG -received a mild diffuse  encephalopathy, nonspecific etiology.  Possibly toxic metabolic etiology.  No seizure or definite epileptiform discharges were seen  Per neurology keppra was d/c'd No indication for AED at this time Neurology signed off   infrarenal abdominal aortic aneurysm  Measuring up to 31 mm. Recommend follow-up every 3 years.    Acute Kidney Injury Resolved Avoid nephrotoxic agents    Hyperglycemia (patient on D5 IVF since Monday) A1c 5.8 BG levels stable continue lantus 6units -add diabetic diet           Family Communication/Anticipated D/C date and plan/Code Status   DVT prophylaxis: Lovenox ordered. Code Status: DNR  Disposition Plan: Status is: Inpatient  Remains inpatient appropriate because:Inpatient level of care appropriate due to severity of illness   Dispo: The patient is from: Home              Anticipated d/c is to: Home              Patient currently is not medically stable to d/c.   Difficult to place patient No         Medical Consultants:    PCCM  ID  GI  neurology   Subjective:   Patient asking if gluten free diet would help her  Objective:    Vitals:   03/10/20 2308 03/11/20 0455 03/11/20 0756 03/11/20 1205  BP: (!) 148/68 128/67 124/79 135/63  Pulse: (!) 117 (!) 102 93 (!) 108  Resp: 16 20 18 17   Temp: 99.3 F (37.4 C) 98.7  F (37.1 C) 98.5 F (36.9 C) 100 F (37.8 C)  TempSrc: Oral  Oral   SpO2: 94% 97% 97% 97%  Weight:      Height:        Intake/Output Summary (Last 24 hours) at 03/11/2020 1325 Last data filed at 03/11/2020 1610 Gross per 24 hour  Intake 1355.75 ml  Output 2000 ml  Net -644.25 ml   Filed Weights   03/07/20 0502 03/09/20 0552 03/10/20 0415  Weight: 71.5 kg 70.2 kg 69.1 kg    Exam:  General: Appearance:    Well developed, well nourished female in no acute distress     Lungs:     respirations unlabored  Heart:    Tachycardic. Normal rhythm. No murmurs, rubs, or gallops.   MS:   All extremities are  intact.   Neurologic:   Awake, alert, oriented x 3. No apparent focal neurological           defect.     Data Reviewed:   I have personally reviewed following labs and imaging studies:  Labs: Labs show the following:   Basic Metabolic Panel: Recent Labs  Lab 03/05/20 0749 03/06/20 0527 03/07/20 0454 03/08/20 0445 03/09/20 0449 03/10/20 0628 03/11/20 0454  NA 135  --  138 138 136  --  136  K 3.6  --  2.8* 3.6 3.4* 3.6 3.6  CL 104  --  104 104 101  --  104  CO2 24  --  27 26 25   --  26  GLUCOSE 139*  --  170* 139* 140*  --  208*  BUN 15  --  9 7* 8  --  7*  CREATININE 1.15*  --  1.06* 0.93 1.06*  --  1.04*  CALCIUM 7.9*  --  7.9* 8.3* 8.3*  --  8.2*  MG 2.1 2.3  --   --   --   --   --   PHOS 2.4* 2.0* 2.1*  --  3.2  --   --    GFR Estimated Creatinine Clearance: 44.4 mL/min (A) (by C-G formula based on SCr of 1.04 mg/dL (H)). Liver Function Tests: Recent Labs  Lab 03/05/20 0749 03/09/20 0449 03/10/20 0628  AST 23  --  26  ALT 15  --  28  ALKPHOS 68  --  130*  BILITOT 1.5*  --  1.0  PROT 5.0*  --  6.2*  ALBUMIN 2.2* 2.5* 2.3*   No results for input(s): LIPASE, AMYLASE in the last 168 hours. No results for input(s): AMMONIA in the last 168 hours. Coagulation profile No results for input(s): INR, PROTIME in the last 168 hours.  CBC: Recent Labs  Lab 03/05/20 0749 03/07/20 0454 03/08/20 0445 03/09/20 0449 03/10/20 0628 03/11/20 0454  WBC 10.9* 14.8* 19.2* 23.8* 17.9* 14.0*  NEUTROABS 7.1  --   --   --   --   --   HGB 10.2* 10.0* 10.2* 10.6* 10.5* 10.1*  HCT 31.9* 30.7* 30.4* 31.6* 31.6* 31.0*  MCV 97.6 95.9 93.8 93.5 94.0 94.8  PLT 148* 254 300 439* 506* 524*   Cardiac Enzymes: No results for input(s): CKTOTAL, CKMB, CKMBINDEX, TROPONINI in the last 168 hours. BNP (last 3 results) No results for input(s): PROBNP in the last 8760 hours. CBG: Recent Labs  Lab 03/10/20 1149 03/10/20 1644 03/10/20 2142 03/11/20 0742 03/11/20 1201  GLUCAP 100* 193*  244* 200* 226*   D-Dimer: No results for input(s): DDIMER in the last 72 hours. Hgb  A1c: No results for input(s): HGBA1C in the last 72 hours. Lipid Profile: No results for input(s): CHOL, HDL, LDLCALC, TRIG, CHOLHDL, LDLDIRECT in the last 72 hours. Thyroid function studies: No results for input(s): TSH, T4TOTAL, T3FREE, THYROIDAB in the last 72 hours.  Invalid input(s): FREET3 Anemia work up: No results for input(s): VITAMINB12, FOLATE, FERRITIN, TIBC, IRON, RETICCTPCT in the last 72 hours. Sepsis Labs: Recent Labs  Lab 03/08/20 0445 03/09/20 0449 03/10/20 0628 03/11/20 0454  WBC 19.2* 23.8* 17.9* 14.0*    Microbiology Recent Results (from the past 240 hour(s))  Urine Culture     Status: None   Collection Time: 03/05/20  9:16 AM   Specimen: Urine, Catheterized  Result Value Ref Range Status   Specimen Description   Final    URINE, CATHETERIZED Performed at [facility], [address] [place], [place] [address]    Special Requests   Final    NONE Performed at [facility], [address] [place], [place] [address]    Culture   Final    NO GROWTH Performed at [facility], [address] [place], [place] [address]    Report Status 03/06/2020 FINAL  Final  CULTURE, BLOOD (ROUTINE X 2) w Reflex to ID Panel     Status: None   Collection Time: 03/05/20 10:02 AM   Specimen: BLOOD  Result Value Ref Range Status   Specimen Description BLOOD LEFT [facility]  Final   Special Requests   Final    BOTTLES DRAWN AEROBIC AND ANAEROBIC Blood Culture adequate volume   Culture   Final    NO GROWTH 5 DAYS Performed at [facility], [address] [place], [place] [address]    Report Status 03/10/2020 FINAL  Final  CULTURE, BLOOD (ROUTINE X 2) w Reflex to ID Panel     Status: None   Collection Time: 03/05/20 10:03 AM   Specimen: BLOOD  Result Value Ref Range Status   Specimen Description BLOOD [facility]  Final   Special Requests   Final    BOTTLES  DRAWN AEROBIC AND ANAEROBIC Blood Culture adequate volume   Culture   Final    NO GROWTH 5 DAYS Performed at [facility], [address] [place], [place] [address]    Report Status 03/10/2020 FINAL  Final  CULTURE, BLOOD (ROUTINE X 2) w Reflex to ID Panel     Status: None (Preliminary result)   Collection Time: 03/09/20 12:22 PM   Specimen: BLOOD  Result Value Ref Range Status   Specimen Description BLOOD LEFT WRIST  Final   Special Requests   Final    BOTTLES DRAWN AEROBIC AND ANAEROBIC Blood Culture adequate volume   Culture   Final    NO GROWTH 2 DAYS Performed at [facility], [address] [place], [place] [address]    Report Status PENDING  Incomplete  CULTURE, BLOOD (ROUTINE X 2) w Reflex to ID Panel     Status: None (Preliminary result)   Collection Time: 03/09/20 12:24 PM   Specimen: BLOOD  Result Value Ref Range Status   Specimen Description BLOOD BLOOD RIGHT HAND  Final   Special Requests   Final    BOTTLES DRAWN AEROBIC AND ANAEROBIC Blood Culture adequate volume   Culture   Final    NO GROWTH 2 DAYS Performed at [facility], [address], [place], [place] [address]    Report Status PENDING  Incomplete    Procedures and diagnostic  studies:  [place] ASCITES (ABDOMEN LIMITED)  Result Date: 03/10/2020 CLINICAL DATA:  75 year old woman with pancreatitis presents today measure radiology for paracentesis. EXAM: LIMITED ABDOMEN ULTRASOUND FOR ASCITES TECHNIQUE: Limited ultrasound survey for ascites was performed in all four abdominal quadrants. COMPARISON:  None. FINDINGS: Sonographic evaluation of the abdomen demonstrates no ascites. Peripancreatic inflammatory changes are seen. No discrete peripancreatic fluid collection identified on ultrasound. Small left pleural effusion also seen. IMPRESSION: No drainable abdominal ascites. Electronically Signed   By: Acquanetta Belling M.D.   On: 03/10/2020 14:17    Medications:   . acidophilus  1 capsule  Oral Daily  . feeding supplement  1 Container Oral TID BM  . fluticasone  2 spray Each Nare Daily  . heparin injection (subcutaneous)  5,000 Units Subcutaneous Q8H  . insulin aspart  0-20 Units Subcutaneous TID WC  . insulin aspart  0-5 Units Subcutaneous QHS  . insulin detemir  6 Units Subcutaneous Daily  . multivitamin with minerals  1 tablet Oral Daily  . pantoprazole (PROTONIX) IV  40 mg Intravenous Q24H   Continuous Infusions: . sodium chloride 250 mL (03/11/20 0903)  . dextrose 5 % and 0.45% NaCl 50 mL/hr at 03/11/20 0623  . meropenem (MERREM) IV 1 g (03/11/20 0905)     LOS: 11 days   Joseph Art  [facility]   How to contact the [facility] Attending or Consulting provider 7A - 7P or covering provider during after hours 7P -7A, for this patient?  1. Check the care team in [facility] and look for a) attending/consulting [facility] provider listed and b) the [facility] team listed 2. Log into [email or web address] and use [facility]'s universal password to access. If you do not have the password, please contact the hospital operator. 3. Locate the [facility] provider you are looking for under [facility] and page to a number that you can be directly reached. 4. If you still have difficulty reaching the provider, please page the [facility] (Director on Call) for the Hospitalists listed on amion for assistance.  03/11/2020, 1:25 PM

## 2020-03-11 NOTE — Progress Notes (Signed)
Date of Admission:  02/29/2020     ID: Holly Navarro is a 75 y.o. female  Active Problems:   Acute pancreatic necrosis   Seizure (HCC)   Necrotizing pancreatitis    Subjective: Pt feeling better today No pain abdomen walked  Medications:  . acidophilus  1 capsule Oral Daily  . feeding supplement  1 Container Oral TID BM  . fluticasone  2 spray Each Nare Daily  . heparin injection (subcutaneous)  5,000 Units Subcutaneous Q8H  . insulin aspart  0-20 Units Subcutaneous TID WC  . insulin aspart  0-5 Units Subcutaneous QHS  . insulin detemir  6 Units Subcutaneous Daily  . multivitamin with minerals  1 tablet Oral Daily  . pantoprazole (PROTONIX) IV  40 mg Intravenous Q24H    Objective: Vital signs in last 24 hours: Patient Vitals for the past 24 hrs:  BP Temp Temp src Pulse Resp SpO2  03/11/20 1205 135/63 100 F (37.8 C) - (!) 108 17 97 %  03/11/20 0756 124/79 98.5 F (36.9 C) Oral 93 18 97 %  03/11/20 0455 128/67 98.7 F (37.1 C) - (!) 102 20 97 %  03/10/20 2308 (!) 148/68 99.3 F (37.4 C) Oral (!) 117 16 94 %  03/10/20 1946 140/73 98.6 F (37 C) - (!) 104 20 97 %  03/10/20 1624 - - - (!) 104 - -  03/10/20 1533 140/68 99.9 F (37.7 C) - (!) 113 16 94 %    PHYSICAL EXAM:  General: Alert, cooperative, no distress,  Lungs: Clear to auscultation bilaterally. No Wheezing or Rhonchi. No rales. Heart: Regular rate and rhythm, no murmur, rub or gallop. Abdomen: Soft, non-tender,not distended. Bowel sounds normal. No masses Extremities: atraumatic, no cyanosis. No edema. No clubbing Skin: No rashes or lesions. Or bruising Lymph: Cervical, supraclavicular normal. Neurologic: Grossly non-focal  Lab Results Recent Labs    03/09/20 0449 03/10/20 0628 03/11/20 0454  WBC 23.8* 17.9* 14.0*  HGB 10.6* 10.5* 10.1*  HCT 31.6* 31.6* 31.0*  NA 136  --  136  K 3.4* 3.6 3.6  CL 101  --  104  CO2 25  --  26  BUN 8  --  7*  CREATININE 1.06*  --  1.04*   Liver  Panel Recent Labs    03/09/20 0449 03/10/20 0628  PROT  --  6.2*  ALBUMIN 2.5* 2.3*  AST  --  26  ALT  --  28  ALKPHOS  --  130*  BILITOT  --  1.0  BILIDIR  --  0.2  IBILI  --  0.8   Sedimentation Rate No results for input(s): ESRSEDRATE in the last 72 hours. C-Reactive Protein No results for input(s): CRP in the last 72 hours.  Microbiology: Blood culture done on 02/29/2020, 03/05/2020, 03/09/2020 all negative  Studies/Results: [place] ASCITES (ABDOMEN LIMITED)  Result Date: 03/10/2020 CLINICAL DATA:  75 year old woman with pancreatitis presents today measure radiology for paracentesis. EXAM: LIMITED ABDOMEN ULTRASOUND FOR ASCITES TECHNIQUE: Limited ultrasound survey for ascites was performed in all four abdominal quadrants. COMPARISON:  None. FINDINGS: Sonographic evaluation of the abdomen demonstrates no ascites. Peripancreatic inflammatory changes are seen. No discrete peripancreatic fluid collection identified on ultrasound. Small left pleural effusion also seen. IMPRESSION: No drainable abdominal ascites. Electronically Signed   By: Acquanetta Belling M.D.   On: 03/10/2020 14:17     Assessment/Plan: Acute pancreatitis.  MRI reported as necrotizing pancreatitis.  Fluctuating leukocytosis.  Initially was on Zosyn for 6 days and  was stopped.  As the leukocytosis started to increase she was started on meropenem on 03/09/2020. We asked IR to aspirate the necrotic area of the pancreas.  An ultrasound abdomen was done yesterday and there was no collection or ascites for aspiration. Continue Meropenem till WBC normalizes and then can stop  Discussed the management with patient and her son and care team

## 2020-03-12 LAB — CBC
HCT: 31.2 % — ABNORMAL LOW (ref 36.0–46.0)
Hemoglobin: 10.4 g/dL — ABNORMAL LOW (ref 12.0–15.0)
MCH: 31.5 pg (ref 26.0–34.0)
MCHC: 33.3 g/dL (ref 30.0–36.0)
MCV: 94.5 fL (ref 80.0–100.0)
Platelets: 581 10*3/uL — ABNORMAL HIGH (ref 150–400)
RBC: 3.3 MIL/uL — ABNORMAL LOW (ref 3.87–5.11)
RDW: 13.2 % (ref 11.5–15.5)
WBC: 11.4 10*3/uL — ABNORMAL HIGH (ref 4.0–10.5)
nRBC: 0 % (ref 0.0–0.2)

## 2020-03-12 LAB — GLUCOSE, CAPILLARY
Glucose-Capillary: 138 mg/dL — ABNORMAL HIGH (ref 70–99)
Glucose-Capillary: 150 mg/dL — ABNORMAL HIGH (ref 70–99)
Glucose-Capillary: 102 mg/dL — ABNORMAL HIGH (ref 70–99)
Glucose-Capillary: 132 mg/dL — ABNORMAL HIGH (ref 70–99)

## 2020-03-12 LAB — BASIC METABOLIC PANEL
Anion gap: 6 (ref 5–15)
BUN: 6 mg/dL — ABNORMAL LOW (ref 8–23)
CO2: 25 mmol/L (ref 22–32)
Calcium: 8.4 mg/dL — ABNORMAL LOW (ref 8.9–10.3)
Chloride: 107 mmol/L (ref 98–111)
Creatinine, Ser: 0.96 mg/dL (ref 0.44–1.00)
GFR, Estimated: >60 mL/min (ref >60)
Glucose, Bld: 118 mg/dL — ABNORMAL HIGH (ref 70–99)
Potassium: 3.9 mmol/L (ref 3.5–5.1)
Sodium: 138 mmol/L (ref 135–145)

## 2020-03-12 MED ORDER — GLUCERNA PO LIQD: 237.0000 mL | Freq: Two times a day (BID) | ORAL | Status: DC

## 2020-03-12 MED ORDER — GLUCERNA SHAKE PO LIQD
237.0000 mL | Freq: Three times a day (TID) | ORAL | Status: DC
  Administered 2020-03-12 – 2020-03-14 (×5): 237 mL via ORAL

## 2020-03-12 MED ORDER — ATENOLOL 25 MG PO TABS
12.5000 mg | ORAL_TABLET | Freq: Every day | ORAL | Status: DC
  Administered 2020-03-13: 12.5 mg via ORAL
  Filled 2020-03-12: qty 0.5

## 2020-03-12 MED ORDER — PANTOPRAZOLE SODIUM 40 MG PO TBEC
40.0000 mg | DELAYED_RELEASE_TABLET | Freq: Every day | ORAL | Status: DC
  Administered 2020-03-12 – 2020-03-13 (×2): 40 mg via ORAL
  Filled 2020-03-12 (×2): qty 1

## 2020-03-12 MED ORDER — ENSURE MAX PROTEIN PO LIQD
11.0000 [oz_av] | Freq: Two times a day (BID) | ORAL | Status: DC
  Filled 2020-03-12: qty 330

## 2020-03-12 NOTE — Progress Notes (Signed)
PHARMACIST - PHYSICIAN COMMUNICATION  CONCERNING: IV to Oral Route Change Policy  RECOMMENDATION: This patient is receiving pantoprazole by the intravenous route.  Based on criteria approved by the Pharmacy and Therapeutics Committee, the intravenous medication(s) is/are being converted to the equivalent oral dose form(s).   DESCRIPTION: These criteria include:  The patient is eating (either orally or via tube) and/or has been taking other orally administered medications for a least 24 hours  The patient has no evidence of active gastrointestinal bleeding or impaired GI absorption (gastrectomy, short bowel, patient on TNA or NPO).  Holly Navarro, [facility] 03/12/2020 4:55 PM

## 2020-03-12 NOTE — Progress Notes (Signed)
   Date of Admission:  02/29/2020    ID: Holly Navarro is a 75 y.o. female  Active Problems:   Acute pancreatic necrosis   Seizure (HCC)   Necrotizing pancreatitis    Subjective: Patient is doing better Pain abdomen better except when she took Glucerna she had some pain.  Medications:  . acidophilus  1 capsule Oral Daily  . [START ON 03/13/2020] atenolol  12.5 mg Oral Daily  . feeding supplement (GLUCERNA SHAKE)  237 mL Oral TID BM  . fluticasone  2 spray Each Nare Daily  . heparin injection (subcutaneous)  5,000 Units Subcutaneous Q8H  . insulin aspart  0-20 Units Subcutaneous TID WC  . insulin aspart  0-5 Units Subcutaneous QHS  . insulin detemir  6 Units Subcutaneous Daily  . lipase/protease/amylase  72,000 Units Oral TID WC  . multivitamin with minerals  1 tablet Oral Daily  . pantoprazole  40 mg Oral QHS    Objective: Vital signs in last 24 hours: Temp:  [97.8 F (36.6 C)-99.4 F (37.4 C)] 99.3 F (37.4 C) (03/10 1954) Pulse Rate:  [61-83] 83 (03/10 1954) Resp:  [16-18] 18 (03/10 1954) BP: (105-141)/(52-76) 119/69 (03/10 1954) SpO2:  [90 %-96 %] 93 % (03/10 1954) Weight:  [68.3 kg] 68.3 kg (03/10 0500)  PHYSICAL EXAM:  General: Alert, cooperative, no distress, appears stated age.  Head: Normocephalic, without obvious abnormality, atraumatic. Eyes: Conjunctivae clear, anicteric sclerae. Pupils are equal ENT Nares normal. No drainage or sinus tenderness. Lips, mucosa, and tongue normal. No Thrush Neck: Supple, symmetrical, no adenopathy, thyroid: non tender no carotid bruit and no JVD. Back: No CVA tenderness. Lungs: Clear to auscultation bilaterally. No Wheezing or Rhonchi. No rales. Heart: Regular rate and rhythm, no murmur, rub or gallop. Abdomen: Soft, non-tender,not distended. Bowel sounds normal. No masses Extremities: atraumatic, no cyanosis. No edema. No clubbing Skin: No rashes or lesions. Or bruising Lymph: Cervical, supraclavicular  normal. Neurologic: Grossly non-focal  Lab Results Recent Labs    03/11/20 0454 03/12/20 0535  WBC 14.0* 11.4*  HGB 10.1* 10.4*  HCT 31.0* 31.2*  NA 136 138  K 3.6 3.9  CL 104 107  CO2 26 25  BUN 7* 6*  CREATININE 1.04* 0.96   Liver Panel Recent Labs    03/10/20 0628  PROT 6.2*  ALBUMIN 2.3*  AST 26  ALT 28  ALKPHOS 130*  BILITOT 1.0  BILIDIR 0.2  IBILI 0.8    Assessment/Plan: Acute pancreatitis.  MRI reported as necrotizing pancreatitis.  Fluctuating leukocytosis.  Initially was on Zosyn for 6 days and leukocytosis had resolved.  And hence the Zosyn was stopped.  When it started to increase again and she was put on meropenem today is day 4.   Patient's leukocytosis is resolving again . Will stop meropenem tomorrow.  I think there is a correlation between what she eats and the pain and leukocytes.  Discussed the management with the care team.

## 2020-03-12 NOTE — Progress Notes (Signed)
Holly Repressohini R Daveyon Kitchings, MD [address]  [address]  [place], [place] [address]  Main: [phone]  Fax: [phone] Pager: [phone]   Subjective: Patient is on full liquids and tolerating very well.  She reports mild lower abdominal discomfort.  She denies any epigastric pain, especially postprandial.  She has been doing very well and wanting to go home.  Continues to have bowel movements  Objective: Vital signs in last 24 hours: Vitals:   03/12/20 0500 03/12/20 0516 03/12/20 0911 03/12/20 1143  BP:  (!) 114/52 (!) 141/63 105/76  Pulse:  61 67 71  Resp:  17 18 16   Temp:  98.4 F (36.9 C) 97.9 F (36.6 C) 97.8 F (36.6 C)  TempSrc:  Oral  Oral  SpO2:  95% 95% 96%  Weight: 68.3 kg     Height:       Weight change:   Intake/Output Summary (Last 24 hours) at 03/12/2020 1800 Last data filed at 03/12/2020 1347 Gross per 24 hour  Intake 800 ml  Output 0 ml  Net 800 ml     Exam: Heart:: Regular rate and rhythm, S1S2 present or without murmur or extra heart sounds Lungs: normal and clear to auscultation Abdomen: Soft, nontender, mildly distended   Lab Results: CBC Latest Ref Rng & Units 03/12/2020 03/11/2020 03/10/2020  WBC 4.0 - 10.5 K/uL 11.4(H) 14.0(H) 17.9(H)  Hemoglobin 12.0 - 15.0 g/dL 10.4(L) 10.1(L) 10.5(L)  Hematocrit 36.0 - 46.0 % 31.2(L) 31.0(L) 31.6(L)  Platelets 150 - 400 K/uL 581(H) 524(H) 506(H)   CMP Latest Ref Rng & Units 03/12/2020 03/11/2020 03/10/2020  Glucose 70 - 99 mg/dL 010(U118(H) 725(D208(H) -  BUN 8 - 23 mg/dL 6(L) 7(L) -  Creatinine 0.44 - 1.00 mg/dL 6.640.96 4.03(K1.04(H) -  Sodium 135 - 145 mmol/L 138 136 -  Potassium 3.5 - 5.1 mmol/L 3.9 3.6 3.6  Chloride 98 - 111 mmol/L 107 104 -  CO2 22 - 32 mmol/L 25 26 -  Calcium 8.9 - 10.3 mg/dL 7.4(Q8.4(L) 5.9(D8.2(L) -  Total Protein 6.5 - 8.1 g/dL - - 6.2(L)  Total Bilirubin 0.3 - 1.2 mg/dL - - 1.0  Alkaline Phos 38 - 126 U/L - - 130(H)  AST 15 - 41 U/L - - 26  ALT 0 - 44 U/L - - 28    Micro Results: Recent Results  (from the past 240 hour(s))  Urine Culture     Status: None   Collection Time: 03/05/20  9:16 AM   Specimen: Urine, Catheterized  Result Value Ref Range Status   Specimen Description   Final    URINE, CATHETERIZED Performed at [facility], [address] [place], [place] [address]    Special Requests   Final    NONE Performed at [facility], [address] [place], [place] [address]    Culture   Final    NO GROWTH Performed at [facility], [address] [place], [place] [address]    Report Status 03/06/2020 FINAL  Final  CULTURE, BLOOD (ROUTINE X 2) w Reflex to ID Panel     Status: None   Collection Time: 03/05/20 10:02 AM   Specimen: BLOOD  Result Value Ref Range Status   Specimen Description [facility]LOOD LEFT [facility]  Final   Special Requests   Final    BOTTLES DRAWN AEROBIC AND ANAEROBIC Blood Culture adequate volume   Culture   Final    NO GROWTH 5 DAYS Performed at [facility], [address] [place], [place] [address]    Report Status 03/10/2020 FINAL  Final  CULTURE, BLOOD (ROUTINE X 2) w Reflex to ID Panel     Status: None   Collection Time: 03/05/20 10:03 AM   Specimen: BLOOD  Result Value Ref Range Status   Specimen Description BLOOD [facility]  Final   Special Requests   Final    BOTTLES DRAWN AEROBIC AND ANAEROBIC Blood Culture adequate volume   Culture   Final    NO GROWTH 5 DAYS Performed at [facility], [address] [place], [place] [address]    Report Status 03/10/2020 FINAL  Final  CULTURE, BLOOD (ROUTINE X 2) w Reflex to ID Panel     Status: None (Preliminary result)   Collection Time: 03/09/20 12:22 PM   Specimen: BLOOD  Result Value Ref Range Status   Specimen Description BLOOD LEFT WRIST  Final   Special Requests   Final    BOTTLES DRAWN AEROBIC AND ANAEROBIC Blood Culture adequate volume   Culture   Final    NO GROWTH 3 DAYS Performed at [facility], [address], [place], [place] [address]     Report Status PENDING  Incomplete  CULTURE, BLOOD (ROUTINE X 2) w Reflex to ID Panel     Status: None (Preliminary result)   Collection Time: 03/09/20 12:24 PM   Specimen: BLOOD  Result Value Ref Range Status   Specimen Description BLOOD BLOOD RIGHT HAND  Final   Special Requests   Final    BOTTLES DRAWN AEROBIC AND ANAEROBIC Blood Culture adequate volume   Culture   Final    NO GROWTH 3 DAYS Performed at [facility], [address] [place], [place] [address]    Report Status PENDING  Incomplete   Studies/Results: No results found. Medications:  I have reviewed the patient's current medications. Prior to Admission:  Medications Prior to Admission  Medication Sig Dispense Refill Last Dose  . atenolol (TENORMIN) 50 MG tablet Take 1 tablet (50 mg total) by mouth daily. 90 tablet 1 02/28/2020 at Unknown time  . cimetidine (TAGAMET) 200 MG tablet Take 200 mg by mouth 2 (two) times daily.   Unknown at Unknown  . co-enzyme Q-10 30 MG capsule Take 30 mg by mouth 3 (three) times daily.   02/28/2020 at Unknown time  . cyclobenzaprine (FLEXERIL) 10 MG tablet Take 10 mg by mouth 3 (three) times daily as needed for muscle spasms.   PRN at PRN  . Dextromethorphan-guaiFENesin (CORICIDIN HBP CONGESTION/COUGH) 10-200 MG CAPS Take 2 capsules by mouth daily as needed.   PRN at PRN  . lisinopril (ZESTRIL) 10 MG tablet Take 1 tablet (10 mg total) by mouth daily. 90 tablet 1 02/28/2020 at Unknown time  . omega-3 acid ethyl esters (LOVAZA) 1 G capsule Take by mouth 2 (two) times daily.   02/28/2020 at Unknown time  . OVER THE COUNTER MEDICATION Take 3 capsules by mouth daily. VISICLEAR - [facility] Formula   Unknown at Unknown  . Phenylephrine-DM-GG (ROBITUSSIN MULTI-SYMPTOM MAX PO) Take 10 mLs by mouth every 4 (four) hours as needed.   PRN at PRN  . rosuvastatin (CRESTOR) 10 MG tablet TAKE 1 TABLET BY MOUTH FIVE DAYS PER WEEK 105 tablet 1 02/28/2020 at Unknown time  . diphenhydrAMINE  (BENADRYL) 25 mg capsule Take 25 mg by mouth Nightly.  (Patient not taking: Reported on 02/29/2020)   Not Taking at Unknown time  . hydrocortisone 2.5 % cream Apply topically. (  Patient not taking: Reported on 02/29/2020)   Not Taking at Unknown time  . milk thistle 175 MG tablet Take 175 mg by mouth daily. (Patient not taking: Reported on 02/29/2020)   Not Taking at Unknown time  . Specialty Vitamins Products (ECHINACEA C COMPLETE PO) Take by mouth. (Patient not taking: Reported on 02/29/2020)   Not Taking at Unknown time   Scheduled: . acidophilus  1 capsule Oral Daily  . [START ON 03/13/2020] atenolol  12.5 mg Oral Daily  . feeding supplement (GLUCERNA SHAKE)  237 mL Oral TID BM  . fluticasone  2 spray Each Nare Daily  . heparin injection (subcutaneous)  5,000 Units Subcutaneous Q8H  . insulin aspart  0-20 Units Subcutaneous TID WC  . insulin aspart  0-5 Units Subcutaneous QHS  . insulin detemir  6 Units Subcutaneous Daily  . lipase/protease/amylase  72,000 Units Oral TID WC  . multivitamin with minerals  1 tablet Oral Daily  . pantoprazole  40 mg Oral QHS   Continuous: . sodium chloride 250 mL (03/11/20 0903)  . meropenem (MERREM) IV 1 g (03/12/20 0904)   WUJ:WJXBJY chloride, HYDROcodone-acetaminophen, HYDROmorphone (DILAUDID) injection, polyethylene glycol, senna-docusate, simethicone Anti-infectives (From admission, onward)   Start     Dose/Rate Route Frequency Ordered Stop   03/09/20 0915  piperacillin-tazobactam (ZOSYN) IVPB 3.375 g  Status:  Discontinued       Note to Pharmacy: Dosing per phamarcy   3.375 g 12.5 mL/hr over 240 Minutes Intravenous Every 8 hours 03/09/20 0816 03/09/20 0823   03/09/20 0915  meropenem (MERREM) 1 g in sodium chloride 0.9 % 100 mL IVPB       Note to Pharmacy: Dosing per pharmacy   1 g 200 mL/hr over 30 Minutes Intravenous Every 12 hours 03/09/20 0823     03/01/20 2000  piperacillin-tazobactam (ZOSYN) IVPB 3.375 g  Status:  Discontinued        3.375  g 12.5 mL/hr over 240 Minutes Intravenous Every 8 hours 03/01/20 1544 03/06/20 1407   03/01/20 0100  metroNIDAZOLE (FLAGYL) IVPB 500 mg  Status:  Discontinued        500 mg 100 mL/hr over 60 Minutes Intravenous Every 8 hours 03/01/20 0003 03/01/20 1543   03/01/20 0100  ceFEPIme (MAXIPIME) 2 g in sodium chloride 0.9 % 100 mL IVPB  Status:  Discontinued        2 g 200 mL/hr over 30 Minutes Intravenous Every 12 hours 03/01/20 0013 03/01/20 1543   02/29/20 0330  ceFEPIme (MAXIPIME) 2 g in sodium chloride 0.9 % 100 mL IVPB        2 g 200 mL/hr over 30 Minutes Intravenous  Once 02/29/20 0319 02/29/20 0435   02/29/20 0330  metroNIDAZOLE (FLAGYL) IVPB 500 mg        500 mg 100 mL/hr over 60 Minutes Intravenous  Once 02/29/20 0319 02/29/20 0505     Scheduled Meds: . acidophilus  1 capsule Oral Daily  . [START ON 03/13/2020] atenolol  12.5 mg Oral Daily  . feeding supplement (GLUCERNA SHAKE)  237 mL Oral TID BM  . fluticasone  2 spray Each Nare Daily  . heparin injection (subcutaneous)  5,000 Units Subcutaneous Q8H  . insulin aspart  0-20 Units Subcutaneous TID WC  . insulin aspart  0-5 Units Subcutaneous QHS  . insulin detemir  6 Units Subcutaneous Daily  . lipase/protease/amylase  72,000 Units Oral TID WC  . multivitamin with minerals  1 tablet Oral Daily  . pantoprazole  40 mg  Oral QHS   Continuous Infusions: . sodium chloride 250 mL (03/11/20 0903)  . meropenem (MERREM) IV 1 g (03/12/20 0904)   PRN Meds:.sodium chloride, HYDROcodone-acetaminophen, HYDROmorphone (DILAUDID) injection, polyethylene glycol, senna-docusate, simethicone   Assessment: Active Problems:   Acute pancreatic necrosis   Seizure (HCC)   Necrotizing pancreatitis   Plan: Acute necrotizing pancreatitis with worsening leukocytosis after discontinuation of antibiotics and after advancement of diet.  Very low clinical suspicion for infected necrosis of the pancreatitis Leukocytosis is improving with  meropenem Patient has been tolerating full liquids, will try soft diet tomorrow Blood cultures from 3/7,NGTD Discussed with ID, Dr. Joylene Draft who agreed to discontinue antibiotics tomorrow due to low clinical suspicion for infected necrosis of the pancreas Continue Creon 72K with first bite of each meal and 36K with snack I have discussed in length with the patient regarding small frequent meals, low-fat, low-carb and high-protein diet, patient requested to see dietitian, will place consult Patient will need close follow-up with GI in 4 to 6 weeks with repeat imaging to assess for fluid collections/walled off necrosis  I discussed my recommendations with the patient and her son who are in agreement with the plan   LOS: 12 days   Krishang Reading 03/12/2020, 6:00 PM

## 2020-03-12 NOTE — Care Management Important Message (Signed)
Important Message  Patient Details  Name: Holly Navarro MRN: [id number] Date of Birth: 1945/01/18   Medicare Important Message Given:  Yes     [name] 03/12/2020, 12:58 PM

## 2020-03-12 NOTE — Progress Notes (Signed)
Progress Note    [name]  ZOX:[id number] DOB: 06/07/1945  DOA: 02/29/2020 PCP: Tarri Fuller, FNP    Brief Narrative:     Medical records reviewed and are as summarized below:  Holly Navarro is an 75 y.o. female with acute necrotizing pancreatitis. She was admitted to the ICU service on 2/26.  On 3/2 she was transferred to [facility] service. Slow to improve.  Being followed by both GI and ID.   Assessment/Plan:   Active Problems:   Acute pancreatic necrosis   Seizure (HCC)   Necrotizing pancreatitis   Acute Necrotizing Pancreatitis -No role for surgery or perc drain at this time, there is not a discrete collection that would benefit -There is no obvious intervention required at the level of the gallbladder or duct  -GI consulted -doing well on clears so will advance to fulls -started creon 3/9   Fever-  -ID consulted: Initially had leukocytosis and with antibiotics it resolved and antibiotic was discontinued after 6 days leukocytosis is going up again.  Very likely there is a correlation with what she is eating and worsening of leukocytosis and pancreatitis.  We asked IR to aspirate the necrotic area or the sciatic fluid.  Ultrasound of the abdomen was done and there was no obvious fluid collection to aspirate.  Patient is currently on meropenem.  We will give for a few days until the WBC count normalizes and then may be switching to either Unasyn or discontinue antibiotics.  GI following her and they had recommended duodenal tube feeding but patient refused that.   Acute Hypoxic Respiratory Failure in the setting of Atelectasis and Left Pleural Effusion -weaned to RA    Hypokalemia-  -repleted   Small pulmonary nodules- found on ct chest 03/08/20 Needs f/u CT in one year to assess for stability   Acute Metabolic Encephalopathy>>resolved Generalized Tonic-Clonic Seizure Has first-time seizure in the ED.  This is provoked episodes in the setting of significant  pancreatitis with lactic acidosis. EEG -received a mild diffuse encephalopathy, nonspecific etiology.  Possibly toxic metabolic etiology.  No seizure or definite epileptiform discharges were seen  Per neurology keppra was d/c'd No indication for AED at this time Neurology signed off   infrarenal abdominal aortic aneurysm  Measuring up to 31 mm. Recommend follow-up every 3 years.    Acute Kidney Injury Resolved Avoid nephrotoxic agents    Hyperglycemia (patient on D5 IVF since Monday) A1c 5.8 BG levels stable continue lantus 6units -add diabetic diet           Family Communication/Anticipated D/C date and plan/Code Status   DVT prophylaxis: Lovenox ordered. Code Status: DNR Son at bedside Disposition Plan: Status is: Inpatient  Remains inpatient appropriate because:Inpatient level of care appropriate due to severity of illness   Dispo: The patient is from: Home              Anticipated d/c is to: Home              Patient currently is not medically stable to d/c.   Difficult to place patient No         Medical Consultants:    PCCM  ID  GI  neurology   Subjective:   No SOB, no CP-- would like to eat more and shower  Objective:    Vitals:   03/12/20 0500 03/12/20 0516 03/12/20 0911 03/12/20 1143  BP:  (!) 114/52 (!) 141/63 105/76  Pulse:  61 67 71  Resp:  17 18 16   Temp:  98.4 F (36.9 C) 97.9 F (36.6 C) 97.8 F (36.6 C)  TempSrc:  Oral  Oral  SpO2:  95% 95% 96%  Weight: 68.3 kg     Height:        Intake/Output Summary (Last 24 hours) at 03/12/2020 1349 Last data filed at 03/12/2020 1347 Gross per 24 hour  Intake 800 ml  Output 0 ml  Net 800 ml   Filed Weights   03/09/20 0552 03/10/20 0415 03/12/20 0500  Weight: 70.2 kg 69.1 kg 68.3 kg    Exam:  General: Appearance:    Well developed, well nourished female in no acute distress     Lungs:     respirations unlabored  Heart:    Normal heart rate. Normal rhythm. No murmurs, rubs,  or gallops.   MS:   All extremities are intact.   Neurologic:   Awake, alert, oriented x 3      Data Reviewed:   I have personally reviewed following labs and imaging studies:  Labs: Labs show the following:   Basic Metabolic Panel: Recent Labs  Lab 03/06/20 0527 03/07/20 0454 03/07/20 0454 03/08/20 0445 03/09/20 0449 03/10/20 0628 03/11/20 0454 03/12/20 0535  NA  --  138  --  138 136  --  136 138  K  --  2.8*   < > 3.6 3.4*   < > 3.6 3.9  CL  --  104  --  104 101  --  104 107  CO2  --  27  --  26 25  --  26 25  GLUCOSE  --  170*  --  139* 140*  --  208* 118*  BUN  --  9  --  7* 8  --  7* 6*  CREATININE  --  1.06*  --  0.93 1.06*  --  1.04* 0.96  CALCIUM  --  7.9*  --  8.3* 8.3*  --  8.2* 8.4*  MG 2.3  --   --   --   --   --   --   --   PHOS 2.0* 2.1*  --   --  3.2  --   --   --    < > = values in this interval not displayed.   GFR Estimated Creatinine Clearance: 48.1 mL/min (by C-G formula based on SCr of 0.96 mg/dL). Liver Function Tests: Recent Labs  Lab 03/09/20 0449 03/10/20 0628  AST  --  26  ALT  --  28  ALKPHOS  --  130*  BILITOT  --  1.0  PROT  --  6.2*  ALBUMIN 2.5* 2.3*   No results for input(s): LIPASE, AMYLASE in the last 168 hours. No results for input(s): AMMONIA in the last 168 hours. Coagulation profile No results for input(s): INR, PROTIME in the last 168 hours.  CBC: Recent Labs  Lab 03/08/20 0445 03/09/20 0449 03/10/20 0628 03/11/20 0454 03/12/20 0535  WBC 19.2* 23.8* 17.9* 14.0* 11.4*  HGB 10.2* 10.6* 10.5* 10.1* 10.4*  HCT 30.4* 31.6* 31.6* 31.0* 31.2*  MCV 93.8 93.5 94.0 94.8 94.5  PLT 300 439* 506* 524* 581*   Cardiac Enzymes: No results for input(s): CKTOTAL, CKMB, CKMBINDEX, TROPONINI in the last 168 hours. BNP (last 3 results) No results for input(s): PROBNP in the last 8760 hours. CBG: Recent Labs  Lab 03/11/20 1201 03/11/20 1627 03/11/20 2111 03/12/20 0728 03/12/20 1140  GLUCAP 226* 72 144* 138* 150*  D-Dimer: No results for input(s): DDIMER in the last 72 hours. Hgb A1c: No results for input(s): HGBA1C in the last 72 hours. Lipid Profile: No results for input(s): CHOL, HDL, LDLCALC, TRIG, CHOLHDL, LDLDIRECT in the last 72 hours. Thyroid function studies: No results for input(s): TSH, T4TOTAL, T3FREE, THYROIDAB in the last 72 hours.  Invalid input(s): FREET3 Anemia work up: No results for input(s): VITAMINB12, FOLATE, FERRITIN, TIBC, IRON, RETICCTPCT in the last 72 hours. Sepsis Labs: Recent Labs  Lab 03/09/20 0449 03/10/20 0628 03/11/20 0454 03/12/20 0535  WBC 23.8* 17.9* 14.0* 11.4*    Microbiology Recent Results (from the past 240 hour(s))  Urine Culture     Status: None   Collection Time: 03/05/20  9:16 AM   Specimen: Urine, Catheterized  Result Value Ref Range Status   Specimen Description   Final    URINE, CATHETERIZED Performed at [facility], [address] [place], [place] [address]    Special Requests   Final    NONE Performed at [facility], [address] [place], [place] [address]    Culture   Final    NO GROWTH Performed at [facility], [address] [place], [place] [address]    Report Status 03/06/2020 FINAL  Final  CULTURE, BLOOD (ROUTINE X 2) w Reflex to ID Panel     Status: None   Collection Time: 03/05/20 10:02 AM   Specimen: BLOOD  Result Value Ref Range Status   Specimen Description BLOOD LEFT [facility]  Final   Special Requests   Final    BOTTLES DRAWN AEROBIC AND ANAEROBIC Blood Culture adequate volume   Culture   Final    NO GROWTH 5 DAYS Performed at [facility], [address] [place], [place] [address]    Report Status 03/10/2020 FINAL  Final  CULTURE, BLOOD (ROUTINE X 2) w Reflex to ID Panel     Status: None   Collection Time: 03/05/20 10:03 AM   Specimen: BLOOD  Result Value Ref Range Status   Specimen Description BLOOD [facility]  Final   Special Requests   Final    BOTTLES DRAWN AEROBIC  AND ANAEROBIC Blood Culture adequate volume   Culture   Final    NO GROWTH 5 DAYS Performed at [facility], [address] [place], [place] [address]    Report Status 03/10/2020 FINAL  Final  CULTURE, BLOOD (ROUTINE X 2) w Reflex to ID Panel     Status: None (Preliminary result)   Collection Time: 03/09/20 12:22 PM   Specimen: BLOOD  Result Value Ref Range Status   Specimen Description BLOOD LEFT WRIST  Final   Special Requests   Final    BOTTLES DRAWN AEROBIC AND ANAEROBIC Blood Culture adequate volume   Culture   Final    NO GROWTH 3 DAYS Performed at [facility], [address] [place], [place] [address]    Report Status PENDING  Incomplete  CULTURE, BLOOD (ROUTINE X 2) w Reflex to ID Panel     Status: None (Preliminary result)   Collection Time: 03/09/20 12:24 PM   Specimen: BLOOD  Result Value Ref Range Status   Specimen Description BLOOD BLOOD RIGHT HAND  Final   Special Requests   Final    BOTTLES DRAWN AEROBIC AND ANAEROBIC Blood Culture adequate volume   Culture   Final    NO GROWTH 3 DAYS Performed at [facility], [address], [place], [place] [address]  Report Status PENDING  Incomplete    Procedures and diagnostic studies:  [place] ASCITES (ABDOMEN LIMITED)  Result Date: 03/10/2020 CLINICAL DATA:  75 year old woman with pancreatitis presents today measure radiology for paracentesis. EXAM: LIMITED ABDOMEN ULTRASOUND FOR ASCITES TECHNIQUE: Limited ultrasound survey for ascites was performed in all four abdominal quadrants. COMPARISON:  None. FINDINGS: Sonographic evaluation of the abdomen demonstrates no ascites. Peripancreatic inflammatory changes are seen. No discrete peripancreatic fluid collection identified on ultrasound. Small left pleural effusion also seen. IMPRESSION: No drainable abdominal ascites. Electronically Signed   By: Acquanetta Belling M.D.   On: 03/10/2020 14:17    Medications:   . acidophilus  1 capsule Oral Daily  .  atenolol  25 mg Oral Daily  . feeding supplement  1 Container Oral TID BM  . fluticasone  2 spray Each Nare Daily  . heparin injection (subcutaneous)  5,000 Units Subcutaneous Q8H  . insulin aspart  0-20 Units Subcutaneous TID WC  . insulin aspart  0-5 Units Subcutaneous QHS  . insulin detemir  6 Units Subcutaneous Daily  . lipase/protease/amylase  72,000 Units Oral TID WC  . multivitamin with minerals  1 tablet Oral Daily  . pantoprazole (PROTONIX) IV  40 mg Intravenous Q24H   Continuous Infusions: . sodium chloride 250 mL (03/11/20 0903)  . meropenem (MERREM) IV 1 g (03/12/20 0904)     LOS: 12 days   Joseph Art  [facility]   How to contact the [facility] Attending or Consulting provider 7A - 7P or covering provider during after hours 7P -7A, for this patient?  1. Check the care team in [facility] and look for a) attending/consulting [facility] provider listed and b) the [facility] team listed 2. Log into [email or web address] and use Secretary's universal password to access. If you do not have the password, please contact the hospital operator. 3. Locate the [facility] provider you are looking for under [facility] and page to a number that you can be directly reached. 4. If you still have difficulty reaching the provider, please page the [facility] (Director on Call) for the Hospitalists listed on amion for assistance.  03/12/2020, 1:49 PM

## 2020-03-13 LAB — CBC
HCT: 30.2 % — ABNORMAL LOW (ref 36.0–46.0)
Hemoglobin: 9.8 g/dL — ABNORMAL LOW (ref 12.0–15.0)
MCH: 30.6 pg (ref 26.0–34.0)
MCHC: 32.5 g/dL (ref 30.0–36.0)
MCV: 94.4 fL (ref 80.0–100.0)
Platelets: 577 10*3/uL — ABNORMAL HIGH (ref 150–400)
RBC: 3.2 MIL/uL — ABNORMAL LOW (ref 3.87–5.11)
RDW: 13.2 % (ref 11.5–15.5)
WBC: 10.5 10*3/uL (ref 4.0–10.5)
nRBC: 0 % (ref 0.0–0.2)

## 2020-03-13 LAB — GLUCOSE, CAPILLARY
Glucose-Capillary: 150 mg/dL — ABNORMAL HIGH (ref 70–99)
Glucose-Capillary: 97 mg/dL (ref 70–99)
Glucose-Capillary: 193 mg/dL — ABNORMAL HIGH (ref 70–99)
Glucose-Capillary: 100 mg/dL — ABNORMAL HIGH (ref 70–99)

## 2020-03-13 MED ORDER — PANTOPRAZOLE SODIUM 40 MG PO TBEC: 40.0000 mg | DELAYED_RELEASE_TABLET | Freq: Every day | ORAL | 0 refills | Status: DC

## 2020-03-13 MED ORDER — PANCRELIPASE (LIP-PROT-AMYL) 36000-114000 UNITS PO CPEP: 72000.0000 [IU] | ORAL_CAPSULE | Freq: Three times a day (TID) | ORAL | 1 refills | Status: DC

## 2020-03-13 MED ORDER — HYDROCODONE-ACETAMINOPHEN 5-325 MG PO TABS: 1.0000 | ORAL_TABLET | Freq: Four times a day (QID) | ORAL | 0 refills | Status: DC | PRN

## 2020-03-13 MED ORDER — ATENOLOL 25 MG PO TABS
25.0000 mg | ORAL_TABLET | Freq: Every day | ORAL | Status: DC
  Administered 2020-03-14: 25 mg via ORAL
  Filled 2020-03-13: qty 1

## 2020-03-13 MED ORDER — BLOOD GLUCOSE MONITOR KIT: PACK | 0 refills | Status: DC

## 2020-03-13 MED ORDER — FLUTICASONE PROPIONATE 50 MCG/ACT NA SUSP: 2.0000 | Freq: Every day | NASAL | 2 refills | Status: DC

## 2020-03-13 NOTE — Progress Notes (Signed)
Progress Note    Holly Navarro  VQQ:[id number] DOB: [birth year]  DOA: 02/29/2020 PCP: Tarri Fuller, FNP    Brief Narrative:     Medical records reviewed and are as summarized below:  Holly Navarro is an 75 y.o. female with acute necrotizing pancreatitis. She was admitted to the ICU service on 2/26.  On 3/2 she was transferred to [facility] service. Slow to improve.  Being followed by both GI and ID.   Assessment/Plan:   Active Problems:   Acute pancreatic necrosis   Seizure (HCC)   Necrotizing pancreatitis   Acute Necrotizing Pancreatitis -No role for surgery or perc drain at this time, there is not a discrete collection that would benefit -There is no obvious intervention required at the level of the gallbladder or duct  -GI consulted -diet advanced and tolerating well -patient and family nervous about only getting 2 soft meals  -started creon 3/9   Fever-  -ID consulted: Initially had leukocytosis and with antibiotics it resolved and antibiotic was discontinued after 6 days leukocytosis is going up again.  Very likely there is a correlation with what she is eating and worsening of leukocytosis and pancreatitis.  We asked IR to aspirate the necrotic area or the sciatic fluid.  Ultrasound of the abdomen was done and there was no obvious fluid collection to aspirate.  Patient is currently on meropenem.  We will give for a few days until the WBC count normalizes and then may be switching to either Unasyn or discontinue antibiotics.  GI following her and they had recommended duodenal tube feeding but patient refused that.   Acute Hypoxic Respiratory Failure in the setting of Atelectasis and Left Pleural Effusion -weaned to RA    Hypokalemia-  -repleted   Small pulmonary nodules- found on ct chest 03/08/20 Needs f/u CT in one year to assess for stability   Acute Metabolic Encephalopathy>>resolved Generalized Tonic-Clonic Seizure Has first-time seizure in the ED.  This is  provoked episodes in the setting of significant pancreatitis with lactic acidosis. EEG -received a mild diffuse encephalopathy, nonspecific etiology.  Possibly toxic metabolic etiology.  No seizure or definite epileptiform discharges were seen  Per neurology keppra was d/c'd No indication for AED at this time Neurology signed off   infrarenal abdominal aortic aneurysm  Measuring up to 31 mm. Recommend follow-up every 3 years.    Acute Kidney Injury Resolved Avoid nephrotoxic agents    Hyperglycemia (patient on D5 IVF since Monday) A1c 5.8 BG levels stable continue lantus 6units -add diabetic diet           Family Communication/Anticipated D/C date and plan/Code Status   DVT prophylaxis: Lovenox ordered. Code Status: DNR Son at bedside Disposition Plan: Status is: Inpatient  Remains inpatient appropriate because:Inpatient level of care appropriate due to severity of illness   Dispo: The patient is from: Home              Anticipated d/c is to: Home              Patient currently is not medically stable to d/c.   Difficult to place patient No         Medical Consultants:    PCCM  ID  GI  neurology   Subjective:   Very anxious with multiple questions  Objective:    Vitals:   03/13/20 0500 03/13/20 0730 03/13/20 1131 03/13/20 1618  BP:  (!) 122/92 121/60 (!) 144/83  Pulse:  86 72 79  Resp:  18 20 20   Temp:  98.4 F (36.9 C) 98.3 F (36.8 C) 98.1 F (36.7 C)  TempSrc:  Oral Oral Oral  SpO2:  93% 93% 95%  Weight: 68.4 kg     Height:        Intake/Output Summary (Last 24 hours) at 03/13/2020 1619 Last data filed at 03/13/2020 1417 Gross per 24 hour  Intake 720 ml  Output --  Net 720 ml   Filed Weights   03/10/20 0415 03/12/20 0500 03/13/20 0500  Weight: 69.1 kg 68.3 kg 68.4 kg    Exam:   General: Appearance:    Well developed, well nourished female with multiple questions     Lungs:     respirations unlabored  Heart:    Normal  heart rate. Normal rhythm. No murmurs, rubs, or gallops.   MS:   All extremities are intact.   Neurologic:   Awake, alert, oriented x 3..       Data Reviewed:   I have personally reviewed following labs and imaging studies:  Labs: Labs show the following:   Basic Metabolic Panel: Recent Labs  Lab 03/07/20 0454 03/08/20 0445 03/09/20 0449 03/10/20 0628 03/11/20 0454 03/12/20 0535  NA 138 138 136  --  136 138  K 2.8* 3.6 3.4*   < > 3.6 3.9  CL 104 104 101  --  104 107  CO2 27 26 25   --  26 25  GLUCOSE 170* 139* 140*  --  208* 118*  BUN 9 7* 8  --  7* 6*  CREATININE 1.06* 0.93 1.06*  --  1.04* 0.96  CALCIUM 7.9* 8.3* 8.3*  --  8.2* 8.4*  PHOS 2.1*  --  3.2  --   --   --    < > = values in this interval not displayed.   GFR Estimated Creatinine Clearance: 48.1 mL/min (by C-G formula based on SCr of 0.96 mg/dL). Liver Function Tests: Recent Labs  Lab 03/09/20 0449 03/10/20 0628  AST  --  26  ALT  --  28  ALKPHOS  --  130*  BILITOT  --  1.0  PROT  --  6.2*  ALBUMIN 2.5* 2.3*   No results for input(s): LIPASE, AMYLASE in the last 168 hours. No results for input(s): AMMONIA in the last 168 hours. Coagulation profile No results for input(s): INR, PROTIME in the last 168 hours.  CBC: Recent Labs  Lab 03/09/20 0449 03/10/20 0628 03/11/20 0454 03/12/20 0535 03/13/20 0828  WBC 23.8* 17.9* 14.0* 11.4* 10.5  HGB 10.6* 10.5* 10.1* 10.4* 9.8*  HCT 31.6* 31.6* 31.0* 31.2* 30.2*  MCV 93.5 94.0 94.8 94.5 94.4  PLT 439* 506* 524* 581* 577*   Cardiac Enzymes: No results for input(s): CKTOTAL, CKMB, CKMBINDEX, TROPONINI in the last 168 hours. BNP (last 3 results) No results for input(s): PROBNP in the last 8760 hours. CBG: Recent Labs  Lab 03/12/20 1140 03/12/20 1655 03/12/20 2131 03/13/20 0737 03/13/20 1128  GLUCAP 150* 102* 132* 150* 97   D-Dimer: No results for input(s): DDIMER in the last 72 hours. Hgb A1c: No results for input(s): HGBA1C in the last  72 hours. Lipid Profile: No results for input(s): CHOL, HDL, LDLCALC, TRIG, CHOLHDL, LDLDIRECT in the last 72 hours. Thyroid function studies: No results for input(s): TSH, T4TOTAL, T3FREE, THYROIDAB in the last 72 hours.  Invalid input(s): FREET3 Anemia work up: No results for input(s): VITAMINB12, FOLATE, FERRITIN, TIBC, IRON, RETICCTPCT in the last 72 hours. Sepsis  Labs: Recent Labs  Lab 03/10/20 0628 03/11/20 0454 03/12/20 0535 03/13/20 0828  WBC 17.9* 14.0* 11.4* 10.5    Microbiology Recent Results (from the past 240 hour(s))  Urine Culture     Status: None   Collection Time: 03/05/20  9:16 AM   Specimen: Urine, Catheterized  Result Value Ref Range Status   Specimen Description   Final    URINE, CATHETERIZED Performed at [facility], [address], [place], [place] [address]    Special Requests   Final    NONE Performed at [facility], [address] [place], [place] [address]    Culture   Final    NO GROWTH Performed at [facility], [address] [place], [place] [address]    Report Status 03/06/2020 FINAL  Final  CULTURE, BLOOD (ROUTINE X 2) w Reflex to ID Panel     Status: None   Collection Time: 03/05/20 10:02 AM   Specimen: BLOOD  Result Value Ref Range Status   Specimen Description BLOOD LEFT [facility]  Final   Special Requests   Final    BOTTLES DRAWN AEROBIC AND ANAEROBIC Blood Culture adequate volume   Culture   Final    NO GROWTH 5 DAYS Performed at [facility], [address] [place], [place] [address]    Report Status 03/10/2020 FINAL  Final  CULTURE, BLOOD (ROUTINE X 2) w Reflex to ID Panel     Status: None   Collection Time: 03/05/20 10:03 AM   Specimen: BLOOD  Result Value Ref Range Status   Specimen Description BLOOD [facility]  Final   Special Requests   Final    BOTTLES DRAWN AEROBIC AND ANAEROBIC Blood Culture adequate volume   Culture   Final    NO GROWTH 5 DAYS Performed at [facility], [address] [place], [place] [address]    Report Status 03/10/2020 FINAL  Final  CULTURE, BLOOD (ROUTINE X 2) w Reflex to ID Panel     Status: None (Preliminary result)   Collection Time: 03/09/20 12:22 PM   Specimen: BLOOD  Result Value Ref Range Status   Specimen Description BLOOD LEFT WRIST  Final   Special Requests   Final    BOTTLES DRAWN AEROBIC AND ANAEROBIC Blood Culture adequate volume   Culture   Final    NO GROWTH 4 DAYS Performed at [facility], [address] [place], [place] [address]    Report Status PENDING  Incomplete  CULTURE, BLOOD (ROUTINE X 2) w Reflex to ID Panel     Status: None (Preliminary result)   Collection Time: 03/09/20 12:24 PM   Specimen: BLOOD  Result Value Ref Range Status   Specimen Description BLOOD BLOOD RIGHT HAND  Final   Special Requests   Final    BOTTLES DRAWN AEROBIC AND ANAEROBIC Blood Culture adequate volume   Culture   Final    NO GROWTH 4 DAYS Performed at [facility], [address], [place], [place] [address]    Report Status PENDING  Incomplete    Procedures and diagnostic studies:  No results found.  Medications:   . acidophilus  1 capsule Oral Daily  . [START ON 03/14/2020] atenolol  25 mg Oral Daily  . feeding supplement (GLUCERNA SHAKE)  237 mL Oral TID BM  . fluticasone  2 spray Each Nare Daily  . insulin aspart  0-20 Units Subcutaneous TID WC  . insulin aspart  0-5 Units Subcutaneous QHS  .  lipase/protease/amylase  72,000 Units Oral TID WC  . multivitamin with minerals  1 tablet Oral Daily  . pantoprazole  40 mg Oral QHS   Continuous Infusions: . sodium chloride 250 mL (03/11/20 0903)     LOS: 13 days   Joseph Art  [facility]   How to contact the [facility] Attending or Consulting provider 7A - 7P or covering provider during after hours 7P -7A, for this patient?  1. Check the care team in [facility] and look for a) attending/consulting [facility] provider listed and b) the [facility] team  listed 2. Log into [email or web address] and use [place]'s universal password to access. If you do not have the password, please contact the hospital operator. 3. Locate the [facility] provider you are looking for under [facility] and page to a number that you can be directly reached. 4. If you still have difficulty reaching the provider, please page the [facility] (Director on Call) for the Hospitalists listed on amion for assistance.  03/13/2020, 4:19 PM

## 2020-03-13 NOTE — Progress Notes (Signed)
PT Cancellation Note  Patient Details Name: Holly Navarro MRN: [id number] DOB: [birth year]   Cancelled Treatment:    Reason Eval/Treat Not Completed: PT screened, no needs identified, will sign off (Met with pt and Son at bedside for FU. Since last PT session, pt has been AMB in room and in hallway with Son or staff.) Pt and son both report the pt's AMB is going well. Pt denies any perceived weakness or difficulty with AMB or basic mobility. Patient is nearing baseline level of independence, all education completed, and time is given to address all questions/concerns. No additional skilled PT services needed at this time, PT signing off. PT recommends daily ambulation ad lib or with nursing staff as needed to prevent deconditioning.     Buccola,Allan C 03/13/2020, 12:22 PM

## 2020-03-13 NOTE — Progress Notes (Signed)
Occupational Therapy Treatment Patient Details Name: Holly Navarro MRN: [id number] DOB: 15-May-1945 Today's Date: 03/13/2020    History of present illness Pt is a 75 y.o. female with history of hypertension, hyperlipidemia, gastric ulcer who presented to [facility] emergency department for sudden onset diffuse severe sharp abdominal pain. MD assessment includes: acute necrotizing pancreatitis, AKI, and acute hypoxic respiratory failure in the setting of atelectasis and left pleural effusion.   OT comments  Upon entering the room, pt supine in bed with son present in the room. Pt with no c/o pain and requesting to shower this session. OT covered IV for safety with shower. Pt ambulating into bathroom with close supervision to commode for toileting needs. OT provided cuing for safety awareness for pt to remove clothing while seated on commode. Pt then ambulating and transferred into shower with min guard. Pt seated on 3 in 1 commode chair with supervision and standing to wash buttocks without assistance. Pt exiting the shower in same manner. Pt donning clothing items with sit <>stand from chair with min guard for balance with LB clothing management.  OT educating pt on energy conservation techniques for self care tasks with pt verbalizing understanding. MD arrived in room as therapist exiting. All needs within reach. Pt continues to benefit from OT intervention with recommendation for [facility] services at discharge.   Follow Up Recommendations  Home health OT;Supervision/Assistance - 24 hour    Equipment Recommendations  3 in 1 bedside commode       Precautions / Restrictions Precautions Precautions: Fall Restrictions Weight Bearing Restrictions: No       Mobility Bed Mobility Overal bed mobility: Modified Independent Bed Mobility: Supine to Sit     Supine to sit: Modified independent (Device/Increase time)     General bed mobility comments: HOB elevated and use of bed rails     Transfers Overall transfer level: Modified independent Equipment used: None Transfers: Sit to/from [employer] Sit to Stand: Supervision Stand pivot transfers: Supervision            Balance Overall balance assessment: Mild deficits observed, not formally tested Sitting-balance support: No upper extremity supported;Feet supported Sitting balance-Leahy Scale: Good     Standing balance support: Single extremity supported;Bilateral upper extremity supported;During functional activity Standing balance-Leahy Scale: Fair                             ADL either performed or assessed with clinical judgement   ADL Overall ADL's : Needs assistance/impaired                                       General ADL Comments: close supervison for shower with use of 3 in 1 BSC to sit on. Pt needing min cuing for safety awareness with tasks.     Vision Patient Visual Report: No change from baseline            Cognition Arousal/Alertness: Awake/alert Behavior During Therapy: WFL for tasks assessed/performed Overall Cognitive Status: Within Functional Limits for tasks assessed                                 General Comments: Pt is A&O x4, limited safety awareness  Pertinent Vitals/ Pain       Pain Assessment: No/denies pain         Frequency  Min 2X/week        Progress Toward Goals  OT Goals(current goals can now be found in the care plan section)  Progress towards OT goals: Progressing toward goals  Acute Rehab OT Goals Patient Stated Goal: To return home independently OT Goal Formulation: With patient/family Time For Goal Achievement: 03/19/20 Potential to Achieve Goals: Good  Plan Discharge plan remains appropriate;Frequency remains appropriate       AM-PAC OT "6 Clicks" Daily Activity     Outcome Measure   Help from another person eating meals?: None Help from another person  taking care of personal grooming?: None Help from another person toileting, which includes using toliet, bedpan, or urinal?: None Help from another person bathing (including washing, rinsing, drying)?: A Little Help from another person to put on and taking off regular upper body clothing?: None Help from another person to put on and taking off regular lower body clothing?: None 6 Click Score: 23    End of Session    OT Visit Diagnosis: Other abnormalities of gait and mobility (R26.89);Muscle weakness (generalized) (M62.81)   Activity Tolerance Patient tolerated treatment well   Patient Left with call bell/phone within reach;in chair;Other (comment) (MD present and son walking to room)   Nurse Communication Mobility status;Precautions        Time: [phone] OT Time Calculation (min): 38 min  Charges: OT General Charges $OT Visit: 1 Visit OT Treatments $Self Care/Home Management : 38-52 mins  Jackquline Denmark, MS, OTR/L , CBIS ascom [phone]  03/13/20, 3:29 PM

## 2020-03-13 NOTE — Progress Notes (Signed)
Holly Navarro Holly Glauber, MD [address]  [address]  [place], [place] [address]  Main: [phone]  Fax: [phone] Pager: [phone]   Subjective: Patient has tolerated soft diet for breakfast this morning.  She denies any epigastric pain, especially postprandial.  She has been doing very well and wanting to go home.  Continues to have bowel movements  Objective: Vital signs in last 24 hours: Vitals:   03/13/20 0400 03/13/20 0500 03/13/20 0730 03/13/20 1131  BP: 133/62  (!) 122/92 121/60  Pulse: 80  86 72  Resp: 16  18 20   Temp: 98.2 F (36.8 C)  98.4 F (36.9 C) 98.3 F (36.8 C)  TempSrc: Oral  Oral Oral  SpO2: 94%  93% 93%  Weight:  68.4 kg    Height:       Weight change: 0.1 kg  Intake/Output Summary (Last 24 hours) at 03/13/2020 1355 Last data filed at 03/13/2020 1100 Gross per 24 hour  Intake 480 ml  Output --  Net 480 ml     Exam: Heart:: Regular rate and rhythm, S1S2 present or without murmur or extra heart sounds Lungs: normal and clear to auscultation Abdomen: Soft, nontender, mildly distended   Lab Results: CBC Latest Ref Rng & Units 03/13/2020 03/12/2020 03/11/2020  WBC 4.0 - 10.5 K/uL 10.5 11.4(H) 14.0(H)  Hemoglobin 12.0 - 15.0 g/dL 3.3(I9.8(L) 10.4(L) 10.1(L)  Hematocrit 36.0 - 46.0 % 30.2(L) 31.2(L) 31.0(L)  Platelets 150 - 400 K/uL 577(H) 581(H) 524(H)   CMP Latest Ref Rng & Units 03/12/2020 03/11/2020 03/10/2020  Glucose 70 - 99 mg/dL 951(O118(H) 841(Y208(H) -  BUN 8 - 23 mg/dL 6(L) 7(L) -  Creatinine 0.44 - 1.00 mg/dL 6.060.96 3.01(S1.04(H) -  Sodium 135 - 145 mmol/L 138 136 -  Potassium 3.5 - 5.1 mmol/L 3.9 3.6 3.6  Chloride 98 - 111 mmol/L 107 104 -  CO2 22 - 32 mmol/L 25 26 -  Calcium 8.9 - 10.3 mg/dL 0.1(U8.4(L) 9.3(A8.2(L) -  Total Protein 6.5 - 8.1 g/dL - - 6.2(L)  Total Bilirubin 0.3 - 1.2 mg/dL - - 1.0  Alkaline Phos 38 - 126 U/L - - 130(H)  AST 15 - 41 U/L - - 26  ALT 0 - 44 U/L - - 28    Micro Results: Recent Results (from the past 240 hour(s))  Urine  Culture     Status: None   Collection Time: 03/05/20  9:16 AM   Specimen: Urine, Catheterized  Result Value Ref Range Status   Specimen Description   Final    URINE, CATHETERIZED Performed at [facility], [address] [place], [place] [address]    Special Requests   Final    NONE Performed at [facility], [address] [place], [place] [address]    Culture   Final    NO GROWTH Performed at [facility], [address] [place], [place] [address]    Report Status 03/06/2020 FINAL  Final  CULTURE, BLOOD (ROUTINE X 2) w Reflex to ID Panel     Status: None   Collection Time: 03/05/20 10:02 AM   Specimen: BLOOD  Result Value Ref Range Status   Specimen Description BLOOD LEFT [facility]  Final   Special Requests   Final    BOTTLES DRAWN AEROBIC AND ANAEROBIC Blood Culture adequate volume   Culture   Final    NO GROWTH 5 DAYS Performed at [facility], [address] [place], [place] [address]  Report Status 03/10/2020 FINAL  Final  CULTURE, BLOOD (ROUTINE X 2) w Reflex to ID Panel     Status: None   Collection Time: 03/05/20 10:03 AM   Specimen: BLOOD  Result Value Ref Range Status   Specimen Description BLOOD [facility]  Final   Special Requests   Final    BOTTLES DRAWN AEROBIC AND ANAEROBIC Blood Culture adequate volume   Culture   Final    NO GROWTH 5 DAYS Performed at [facility], [address] [place], [place] [address]    Report Status 03/10/2020 FINAL  Final  CULTURE, BLOOD (ROUTINE X 2) w Reflex to ID Panel     Status: None (Preliminary result)   Collection Time: 03/09/20 12:22 PM   Specimen: BLOOD  Result Value Ref Range Status   Specimen Description BLOOD LEFT WRIST  Final   Special Requests   Final    BOTTLES DRAWN AEROBIC AND ANAEROBIC Blood Culture adequate volume   Culture   Final    NO GROWTH 4 DAYS Performed at [facility], [address], [place], [place] [address]    Report Status PENDING   Incomplete  CULTURE, BLOOD (ROUTINE X 2) w Reflex to ID Panel     Status: None (Preliminary result)   Collection Time: 03/09/20 12:24 PM   Specimen: BLOOD  Result Value Ref Range Status   Specimen Description BLOOD BLOOD RIGHT HAND  Final   Special Requests   Final    BOTTLES DRAWN AEROBIC AND ANAEROBIC Blood Culture adequate volume   Culture   Final    NO GROWTH 4 DAYS Performed at [facility], [address] [place], [place] [address]    Report Status PENDING  Incomplete   Studies/Results: No results found. Medications:  I have reviewed the patient's current medications. Prior to Admission:  Medications Prior to Admission  Medication Sig Dispense Refill Last Dose  . atenolol (TENORMIN) 50 MG tablet Take 1 tablet (50 mg total) by mouth daily. 90 tablet 1 02/28/2020 at Unknown time  . cimetidine (TAGAMET) 200 MG tablet Take 200 mg by mouth 2 (two) times daily.   Unknown at Unknown  . co-enzyme Q-10 30 MG capsule Take 30 mg by mouth 3 (three) times daily.   02/28/2020 at Unknown time  . cyclobenzaprine (FLEXERIL) 10 MG tablet Take 10 mg by mouth 3 (three) times daily as needed for muscle spasms.   PRN at PRN  . Dextromethorphan-guaiFENesin (CORICIDIN HBP CONGESTION/COUGH) 10-200 MG CAPS Take 2 capsules by mouth daily as needed.   PRN at PRN  . lisinopril (ZESTRIL) 10 MG tablet Take 1 tablet (10 mg total) by mouth daily. 90 tablet 1 02/28/2020 at Unknown time  . omega-3 acid ethyl esters (LOVAZA) 1 G capsule Take by mouth 2 (two) times daily.   02/28/2020 at Unknown time  . OVER THE COUNTER MEDICATION Take 3 capsules by mouth daily. VISICLEAR - [facility] Formula   Unknown at Unknown  . Phenylephrine-DM-GG (ROBITUSSIN MULTI-SYMPTOM MAX PO) Take 10 mLs by mouth every 4 (four) hours as needed.   PRN at PRN  . rosuvastatin (CRESTOR) 10 MG tablet TAKE 1 TABLET BY MOUTH FIVE DAYS PER WEEK 105 tablet 1 02/28/2020 at Unknown time  . diphenhydrAMINE (BENADRYL) 25 mg capsule Take  25 mg by mouth Nightly.  (Patient not taking: Reported on 02/29/2020)   Not Taking at Unknown time  . hydrocortisone 2.5 % cream Apply topically. (Patient not taking: Reported on 02/29/2020)  Not Taking at Unknown time  . milk thistle 175 MG tablet Take 175 mg by mouth daily. (Patient not taking: Reported on 02/29/2020)   Not Taking at Unknown time  . Specialty Vitamins Products (ECHINACEA C COMPLETE PO) Take by mouth. (Patient not taking: Reported on 02/29/2020)   Not Taking at Unknown time   Scheduled: . acidophilus  1 capsule Oral Daily  . atenolol  12.5 mg Oral Daily  . feeding supplement (GLUCERNA SHAKE)  237 mL Oral TID BM  . fluticasone  2 spray Each Nare Daily  . heparin injection (subcutaneous)  5,000 Units Subcutaneous Q8H  . insulin aspart  0-20 Units Subcutaneous TID WC  . insulin aspart  0-5 Units Subcutaneous QHS  . insulin detemir  6 Units Subcutaneous Daily  . lipase/protease/amylase  72,000 Units Oral TID WC  . multivitamin with minerals  1 tablet Oral Daily  . pantoprazole  40 mg Oral QHS   Continuous: . sodium chloride 250 mL (03/11/20 0903)  . meropenem (MERREM) IV 1 g (03/13/20 0912)   ACZ:YSAYTK chloride, HYDROcodone-acetaminophen, HYDROmorphone (DILAUDID) injection, polyethylene glycol, senna-docusate, simethicone Anti-infectives (From admission, onward)   Start     Dose/Rate Route Frequency Ordered Stop   03/09/20 0915  piperacillin-tazobactam (ZOSYN) IVPB 3.375 g  Status:  Discontinued       Note to Pharmacy: Dosing per phamarcy   3.375 g 12.5 mL/hr over 240 Minutes Intravenous Every 8 hours 03/09/20 0816 03/09/20 0823   03/09/20 0915  meropenem (MERREM) 1 g in sodium chloride 0.9 % 100 mL IVPB       Note to Pharmacy: Dosing per pharmacy   1 g 200 mL/hr over 30 Minutes Intravenous Every 12 hours 03/09/20 0823     03/01/20 2000  piperacillin-tazobactam (ZOSYN) IVPB 3.375 g  Status:  Discontinued        3.375 g 12.5 mL/hr over 240 Minutes Intravenous Every 8  hours 03/01/20 1544 03/06/20 1407   03/01/20 0100  metroNIDAZOLE (FLAGYL) IVPB 500 mg  Status:  Discontinued        500 mg 100 mL/hr over 60 Minutes Intravenous Every 8 hours 03/01/20 0003 03/01/20 1543   03/01/20 0100  ceFEPIme (MAXIPIME) 2 g in sodium chloride 0.9 % 100 mL IVPB  Status:  Discontinued        2 g 200 mL/hr over 30 Minutes Intravenous Every 12 hours 03/01/20 0013 03/01/20 1543   02/29/20 0330  ceFEPIme (MAXIPIME) 2 g in sodium chloride 0.9 % 100 mL IVPB        2 g 200 mL/hr over 30 Minutes Intravenous  Once 02/29/20 0319 02/29/20 0435   02/29/20 0330  metroNIDAZOLE (FLAGYL) IVPB 500 mg        500 mg 100 mL/hr over 60 Minutes Intravenous  Once 02/29/20 0319 02/29/20 0505     Scheduled Meds: . acidophilus  1 capsule Oral Daily  . atenolol  12.5 mg Oral Daily  . feeding supplement (GLUCERNA SHAKE)  237 mL Oral TID BM  . fluticasone  2 spray Each Nare Daily  . heparin injection (subcutaneous)  5,000 Units Subcutaneous Q8H  . insulin aspart  0-20 Units Subcutaneous TID WC  . insulin aspart  0-5 Units Subcutaneous QHS  . insulin detemir  6 Units Subcutaneous Daily  . lipase/protease/amylase  72,000 Units Oral TID WC  . multivitamin with minerals  1 tablet Oral Daily  . pantoprazole  40 mg Oral QHS   Continuous Infusions: . sodium chloride 250 mL (03/11/20 0903)  .  meropenem (MERREM) IV 1 g (03/13/20 0912)   PRN Meds:.sodium chloride, HYDROcodone-acetaminophen, HYDROmorphone (DILAUDID) injection, polyethylene glycol, senna-docusate, simethicone   Assessment: Active Problems:   Acute pancreatic necrosis   Seizure (HCC)   Necrotizing pancreatitis   Plan: Acute necrotizing pancreatitis with worsening leukocytosis after discontinuation of antibiotics and after advancement of diet.  Very low clinical suspicion for infected necrosis of the pancreatitis Leukocytosis has resolved, last dose of antibiotics today per ID Continue soft diet today Blood cultures from  3/7,NGTD Discussed with ID, Dr. Joylene Draft who agreed to discontinue antibiotics today due to low clinical suspicion for infected necrosis of the pancreas Continue Creon 72K with first bite of each meal and 36K with snack I have discussed in length with the patient regarding small frequent meals, low-fat, low-carb and high-protein diet, patient requested to see dietitian, will place consult Patient will need close follow-up with GI in 4 to 6 weeks with repeat imaging to assess for fluid collections/walled off necrosis  I discussed my recommendations with the patient and her son who are in agreement with the plan  GI will sign off at this time and please call us back with questions or concerns   LOS: 13 days   Holly Navarro 03/13/2020, 1:55 PM

## 2020-03-13 NOTE — Progress Notes (Signed)
   Date of Admission:  5/46/5035      ID: Holly Navarro is a 75 y.o. female  Active Problems:   Acute pancreatic necrosis   Seizure ([facility])   Necrotizing pancreatitis    Subjective: Feeling better No pain abdomen Met with nutritionist and discussed diet. Showered    Medications:  . acidophilus  1 capsule Oral Daily  . atenolol  12.5 mg Oral Daily  . feeding supplement (GLUCERNA SHAKE)  237 mL Oral TID BM  . fluticasone  2 spray Each Nare Daily  . heparin injection (subcutaneous)  5,000 Units Subcutaneous Q8H  . insulin aspart  0-20 Units Subcutaneous TID WC  . insulin aspart  0-5 Units Subcutaneous QHS  . insulin detemir  6 Units Subcutaneous Daily  . lipase/protease/amylase  72,000 Units Oral TID WC  . multivitamin with minerals  1 tablet Oral Daily  . pantoprazole  40 mg Oral QHS    Objective: Vital signs in last 24 hours: Patient Vitals for the past 24 hrs:  BP Temp Temp src Pulse Resp SpO2 Weight  03/13/20 1131 121/60 98.3 F (36.8 C) Oral 72 20 93 % --  03/13/20 0730 (!) 122/92 98.4 F (36.9 C) Oral 86 18 93 % --  03/13/20 0500 -- -- -- -- -- -- 68.4 kg  03/13/20 0400 133/62 98.2 F (36.8 C) Oral 80 16 94 % --  03/12/20 2301 (!) 147/61 99 F (37.2 C) Oral 77 18 92 % --  03/12/20 1954 119/69 99.3 F (37.4 C) Oral 83 18 93 % --   PHYSICAL EXAM:  General: Alert, cooperative, no distress, appears stated age.  Head: Normocephalic, without obvious abnormality, atraumatic. Eyes: Conjunctivae clear, anicteric sclerae. Pupils are equal ENT Nares normal. No drainage or sinus tenderness. Lips, mucosa, and tongue normal. No Thrush Neck: Supple, symmetrical, no adenopathy, thyroid: non tender no carotid bruit and no JVD. Back: No CVA tenderness. Lungs: Clear to auscultation bilaterally. No Wheezing or Rhonchi. No rales. Heart: Regular rate and rhythm, no murmur, rub or gallop. Abdomen: Soft, non-tender,not distended. Bowel sounds normal. No masses Extremities:  atraumatic, no cyanosis. No edema. No clubbing Skin: No rashes or lesions. Or bruising Lymph: Cervical, supraclavicular normal. Neurologic: Grossly non-focal  Lab Results Recent Labs    03/11/20 0454 03/12/20 0535 03/13/20 0828  WBC 14.0* 11.4* 10.5  HGB 10.1* 10.4* 9.8*  HCT 31.0* 31.2* 30.2*  NA 136 138  --   K 3.6 3.9  --   CL 104 107  --   CO2 26 25  --   BUN 7* 6*  --   CREATININE 1.04* 0.96  --    Liver Panel No results for input(s): PROT, ALBUMIN, AST, ALT, ALKPHOS, BILITOT, BILIDIR, IBILI in the last 72 hours. Sedimentation Rate No results for input(s): ESRSEDRATE in the last 72 hours. C-Reactive Protein No results for input(s): CRP in the last 72 hours.  Microbiology: Blood culture 03/05/2020 - Blood culture 03/09/2020 - Urine culture 03/05/2020 - Studies/Results: No results found.   Assessment/Plan: Acute pancreatitis.  MRI reported as necrotizing pancreatitis.  Fluctuating leukocytosis has normalized. Has been on antibiotics since 02/29/20 Currently on meropenem day 5 can stop.  Anemia  Hypoalbuminemia  Discussed the management with patient and her son and hospitalist She will follow up with GI as OP ID will sign off- call if needed

## 2020-03-13 NOTE — TOC Progression Note (Signed)
Transition of Care [facility]) - Progression Note    Patient Details  Name: Holly Navarro MRN: [id number] Date of Birth: 1945-01-08  [facility] CM/SW Contact  Chapman Fitch, RN Phone Number: 03/13/2020, 2:32 PM  Clinical Narrative:      Per MD potential DC today or tomorrow Patient and son who is at bedside updated.  Family has already taken DME to the home [place] with [facility] updated       Expected Discharge Plan and Services                                                 Social Determinants of Health (SDOH) Interventions    Readmission Risk Interventions No flowsheet data found.

## 2020-03-13 NOTE — Progress Notes (Addendum)
Inpatient Diabetes Program Recommendations  AACE/ADA: New Consensus Statement on Inpatient Glycemic Control (2015)  Target Ranges:  Prepandial:   less than 140 mg/dL      Peak postprandial:   less than 180 mg/dL (1-2 hours)      Critically ill patients:  140 - 180 mg/dL   Results for Holly Navarro, Holly Navarro (MRN [id number]) as of 03/13/2020 13:01  Ref. Range 03/11/2020 07:42 03/11/2020 12:01 03/11/2020 16:27 03/11/2020 21:11  Glucose-Capillary Latest Ref Range: 70 - 99 mg/dL 191 (H)  4 units NOVOLOG  6 units LEVEMIR  226 (H)  7 units NOVOLOG  72 144 (H)   Results for Holly Navarro, Holly Navarro (MRN [id number]) as of 03/13/2020 13:01  Ref. Range 03/12/2020 07:28 03/12/2020 11:40 03/12/2020 16:55 03/12/2020 21:31  Glucose-Capillary Latest Ref Range: 70 - 99 mg/dL 308 (H)  3 units NOVOLOG  6 units LEVEMIR  150 (H)  3 units NOVOLOG  102 (H) 132 (H)   Results for Holly Navarro, Holly Navarro (MRN [id number]) as of 03/13/2020 13:01  Ref. Range 03/13/2020 07:37 03/13/2020 11:28  Glucose-Capillary Latest Ref Range: 70 - 99 mg/dL 952 (H)  3 units NOVOLOG  6 units LEVEMIR 97   Admit Necrotizing Pancreatitis  NO History of Diabetes  A1c was 5.8%  Current Orders: Levemir 6 units Daily      Novolog Resistant Correction Scale/ SSI (0-20 units) TID AC + HS    MD- Note patient was getting D5 1/2 NS IVF--These IVF were stopped afternoon on 03/09  Requiring very little insulin at this point  Since A1c was 5.8%, recommend Stopping Levemir for now and please reduce Novolog SSI to the Sensitive 0-9 unit scale  At time of discharge home, May want to have pt check CBGs at home TID before meals for several weeks post-discharge and have her contact PCP if CBGs stay elevated >180 at home   Addendum 3pm--Sent Secure Chat to RN at 1:20pm asking RN to please make sure CBG meter teaching was started in case pt will need to check CBGs at home after discharge.  Also placed Patient Education order for CBG meter teaching and attached written  CBG meter education to AVS.    --Will follow patient during hospitalization--  Ambrose Finland RN, MSN, CDE Diabetes Coordinator Inpatient Glycemic Control Team Team Pager: [phone] (8a-5p)

## 2020-03-13 NOTE — Progress Notes (Signed)
Nutrition Follow-up  DOCUMENTATION CODES:   Not applicable  INTERVENTION:   Glucerna Shake po TID, each supplement provides 220 kcal and 10 grams of protein  MVI po daily   Pancreatitis diet education   NUTRITION DIAGNOSIS:   Inadequate oral intake related to acute illness as evidenced by other (comment) (pt on clear liquid diet).  GOAL:   Patient will meet greater than or equal to 90% of their needs  MONITOR:   PO intake,Supplement acceptance,Diet advancement,Labs,Weight trends,Skin,I & O's  REASON FOR ASSESSMENT:   Consult Enteral/tube feeding initiation and management  ASSESSMENT:   75 y/o female with h/o seizures, CKD III, PUD, HLD and HTN who is admitted with acute pancreatitis  Met with pt and pt's son in room today. Pt in good spirits, sitting up in chair and is ready to go home; plan is for discharge tomorrow. Pt reports fair appetite and oral intake at baseline; pt reports that she does not each much at baseline. Pt reports eating mainly two meals per day along with some snacks. Pt follows a very healthy diet at baseline; pt eats a lot of fruits and vegetables and tries to avoid sugar. Pt currently eating 80-90% of meals in hospital and pt is drinking Glucerna supplements. Per chart, pt appears weight stable at baseline. RD provided pt with pancreatitis diet education at length today; RD answered all of patient and son's questions.   Medications reviewed and include: risaquad, insulin, creon, MVI, protonix  Labs reviewed: BUN 6(L) Hgb 9.8(L), Hct 30.2(L) cbgs- 150, 97 x 24 hrs AIC 5.8(H)- 3/1  NUTRITION - FOCUSED PHYSICAL EXAM:  Flowsheet Row Most Recent Value  Orbital Region Mild depletion  Upper Arm Region No depletion  Thoracic and Lumbar Region No depletion  Buccal Region Mild depletion  Temple Region Moderate depletion  Clavicle Bone Region Mild depletion  Clavicle and Acromion Bone Region Mild depletion  Scapular Bone Region No depletion  Dorsal  Hand No depletion  Patellar Region Severe depletion  Anterior Thigh Region Severe depletion  Posterior Calf Region Severe depletion  Edema (RD Assessment) None  Hair Reviewed  Eyes Reviewed  Mouth Reviewed  Skin Reviewed  Nails Reviewed     Diet Order:   Diet Order            DIET SOFT Room service appropriate? Yes; Fluid consistency: Thin  Diet effective now                EDUCATION NEEDS:   Education needs have been addressed  Skin:  Skin Assessment: Reviewed RN Assessment  Last BM:  3/10  Height:   Ht Readings from Last 1 Encounters:  02/29/20 5' 6"  (1.676 m)    Weight:   Wt Readings from Last 1 Encounters:  03/13/20 68.4 kg    Ideal Body Weight:  59 kg  BMI:  Body mass index is 24.34 kg/m.  Estimated Nutritional Needs:   Kcal:  1600-1800kcal/day  Protein:  80-90g/day  Fluid:  1.6-1.8L/day  Koleen Distance MS, RD, LDN Please refer to [facility] for RD and/or RD on-call/weekend/after hours pager

## 2020-03-13 NOTE — Plan of Care (Signed)
Nutrition Education Note  RD educated patient today regarding a pancreatitis diet   RD provided "Nutrition Therapy with Pancreatitis" handout from the Academy of Nutrition and Dietetics. RD also provided a list of low fat vs high fat foods. Reviewed patient's dietary recall. Advised patient to eat multiple small meals and to avoid high fat foods, spicy foods and alcohol. Educated pt on the importance of adequate protein intake needed to preserve lean muscle. Gave patient examples of meals and menu planning.    Teach back method used.  Expect good compliance.  Body mass index is 24.34 kg/m. Pt meets criteria for normal weight based on current BMI.  RD following this pt  Holly Holiday MS, RD, LDN Please refer to [facility] for RD and/or RD on-call/weekend/after hours pager

## 2020-03-14 LAB — GLUCOSE, CAPILLARY: Glucose-Capillary: 112 mg/dL — ABNORMAL HIGH (ref 70–99)

## 2020-03-14 LAB — CULTURE, BLOOD (ROUTINE X 2)
Culture: NO GROWTH
Culture: NO GROWTH
Special Requests: ADEQUATE
Special Requests: ADEQUATE

## 2020-03-14 NOTE — TOC Transition Note (Signed)
Transition of Care [facility] - CM/SW Discharge Note   Patient Details  Name: Holly Navarro MRN: [id number] Date of Birth: Jan 06, 1945  Transition [facility]) CM/SW Contact:  Luvenia Redden, RN Phone [phone] 03/14/2020, 10:47 AM   Clinical Narrative:    RN spoke with Holly Navarro at [facility] to confirm pt's discharge today. Also spoke with pt's son Holly Navarro [phone] to confirm [facility]'s awareness of pt's discharge today for pending services. No questions or inquires at this time. Pt pending discharged from the hospital today in the care of her son Holly Navarro).   Final next level of care: Home w Home Health Services Barriers to Discharge: No Barriers Identified   Patient Goals and CMS Choice        Discharge Placement                  Name of family member notified: Holly Navarro (son) Patient and family notified of of transfer: 03/14/20 (Son was informed on yesterday)  Discharge Plan and Services                            [facility] Agency: [facility] Date [facility] Agency Contacted: 03/14/20 Time HH Agency Contacted: 1044 Representative spoke with at [facility] Agency: Holly Navarro (Confirming agency set up)  Social Determinants of Health (SDOH) Interventions     Readmission Risk Interventions No flowsheet data found.

## 2020-03-14 NOTE — Discharge Instructions (Signed)
No driving while taking pain medications  Blood Glucose Monitoring, Adult Monitoring your blood sugar (glucose) is an important part of managing your diabetes. Blood glucose monitoring involves checking your blood glucose as often as directed and keeping a log or record of your results over time. Checking your blood glucose regularly and keeping a blood glucose log can:  Help you and your health care provider adjust your diabetes management plan as needed, including your medicines or insulin.  Help you understand how food, exercise, illnesses, and medicines affect your blood glucose.  Let you know what your blood glucose is at any time. You can quickly find out if you have low blood glucose (hypoglycemia) or high blood glucose (hyperglycemia). Your health care provider will set individualized treatment goals for you. Your goals will be based on your age, other medical conditions you have, and how you respond to diabetes treatment. Generally, the goal of treatment is to maintain the following blood glucose levels:  Before meals (preprandial): 80-130 mg/dL (1.6-1.0 mmol/L).  After meals (postprandial): below 180 mg/dL (10 mmol/L).  A1C level: less than 7%. Supplies needed:  Blood glucose meter.  Test strips for your meter. Each meter has its own strips. You must use the strips that came with your meter.  A needle to prick your finger (lancet). Do not use a lancet more than one time.  A device that holds the lancet (lancing device).  A journal or log book to write down your results. How to check your blood glucose Checking your blood glucose 1. Wash your hands for at least 20 seconds with soap and water. 2. Prick the side of your finger (not the tip) with the lancet. Do not use the same finger consecutively. 3. Gently rub the finger until a small drop of blood appears. 4. Follow instructions that come with your meter for inserting the test strip, applying blood to the strip, and using  your blood glucose meter. 5. Write down your result and any notes in your log.    Using alternative sites Some meters allow you to use areas of your body other than your finger (alternative sites) to test your blood. The most common alternative sites are the forearm, the thigh, and the palm of your hand. Alternative sites may not be as accurate as the fingers because blood flow is slower in those areas. This means that the result you get may be delayed, and it may be different from the result that you would get from your finger. Use the finger only, and do not use alternative sites, if:  You think you have hypoglycemia.  You sometimes do not know that your blood glucose is getting low (hypoglycemia unawareness). General tips and recommendations Blood glucose log  Every time you check your blood glucose, write down your result. Also write down any notes about things that may be affecting your blood glucose, such as your diet and exercise for the day. This information can help you and your health care provider: ? Look for patterns in your blood glucose over time. ? Adjust your diabetes management plan as needed.  Check if your meter allows you to download your records to a computer or if there is an app for the meter. Most glucose meters store a record of glucose readings in the meter.     General tips  Make sure you always have your supplies with you.  After you use a few boxes of test strips, adjust (calibrate) your blood glucose meter by  following instructions that came with your meter.  If you have questions or need help, all blood glucose meters have a 24-hour hotline phone number available that you can call. Also contact your health care provider with questions or concerns you may have. Where to find more information  The American Diabetes Association: www.diabetes.org  The Association of Diabetes [employer] & Education Specialists: www.diabeteseducator.org Contact a health care  provider if:  Your blood glucose is at or above 240 mg/dL (34.2 mmol/L) for 2 days in a row.  You have been sick or have had a fever for 2 days or longer, and you are not getting better.  You have any of the following problems for more than 6 hours: ? You cannot eat or drink. ? You have nausea or vomiting. ? You have diarrhea. Get help right away if:  Your blood glucose is lower than 54 mg/dL (3 mmol/L).  You become confused, or you have trouble thinking clearly.  You have difficulty breathing.  You have moderate or large ketone levels in your urine. These symptoms may represent a serious problem that is an emergency. Do not wait to see if the symptoms will go away. Get medical help right away. Call your local emergency services (911 in the [place] Do not drive yourself to the hospital. Summary  Monitoring your blood glucose is an important part of managing your diabetes.  Blood glucose monitoring involves checking your blood glucose as often as directed and keeping a log or record of your results over time.  Your health care provider will set individualized treatment goals for you. Your goals will be based on your age, other medical conditions you have, and how you respond to diabetes treatment.  Every time you check your blood glucose, write down your result. Also, write down any notes about things that may be affecting your blood glucose, such as your diet and exercise for the day. This information is not intended to replace advice given to you by your health care provider. Make sure you discuss any questions you have with your health care provider. Document Revised: 09/18/2019 Document Reviewed: 09/18/2019 Elsevier Patient Education  2021 [employer].

## 2020-03-14 NOTE — Progress Notes (Signed)
Discharge instructions discussed with patient and son. Questions answered. Patient and son verify understanding of all instructions.

## 2020-03-14 NOTE — Discharge Summary (Addendum)
Physician Discharge Summary  Holly Navarro MVH:[id number] DOB: 1945-07-06 DOA: 02/29/2020  PCP: Verl Bangs, FNP  Admit date: 02/29/2020 Discharge date: 03/14/2020  Admitted From: home Discharge disposition: home   Recommendations for Outpatient Follow-Up:   1. Close GI follow up 2. Patient to monitor blood sugars and bring log to PCP 3. infrarenal abdominal aortic aneurysm/pulmonary nodule outpatient follow up   Discharge Diagnosis:   Active Problems:   Acute pancreatic necrosis   Seizure ([place])   Necrotizing pancreatitis  Sepsis ruled out  Discharge Condition: Improved.  Diet recommendation: Low fat/low carb  Wound care: None.  Code status: Full.   History of Present Illness:   Holly A Ryanis a 75 y.o.femalewith history of hypertension, hyperlipidemia, gastric ulcer who presented to [facility] emergency department for sudden onset diffuse severe sharp abdominal pain. The discomfort began  Around 10 PM last night and was associated with onset of nausea and cramps. She did vomit and became diaphoretic after emesis. The pain did radiate across her entire abdomen and to her back. There was no associated diarrhea or lose stool and no hematochezia or hematemesis. She was found to be hypothermic and hypertensive upon her arrival she was warmed and started on fluids. Her evaluation returned a lactic acid of 6. There was a leukocytosis in excess of 20. A CT revealed necrotizing pancreatitis with a corroborating lipase. The case was discussed with our local surgeon who felt she may be best served in transfer. The case was then d/w with [facility] Intensivist who explained there were no beds and without acute intervention planned, she would likely need to wait and be stabilized at [facility]   Hospital Course by Problem:   Acute Necrotizing Pancreatitis -No role for surgery or perc drain at this time, there is not a discrete collection that would benefit -There is no obvious  intervention required at the level of the gallbladder or duct -GI consulted -diet advanced and tolerating well -started creon 3/9  Fever-  -ID consulted: Initially had leukocytosis and with antibiotics it resolved and antibiotic was discontinued after 6 days leukocytosis is going up again. Very likely there is a correlation with what she is eating and worsening of leukocytosis and pancreatitis. We asked IR to aspirate the necrotic area or the sciatic fluid. Ultrasound of the abdomen was done and there was no obvious fluid collection to aspirate.  Patient is currently on meropenem. We will give for a few days until the WBC count normalizes and then may be switching to either Unasyn or discontinue antibiotics. GI following her and they had recommended duodenal tube feeding but patient refused that. -resolved  Acute Hypoxic Respiratory Failure in the setting of Atelectasis and Left Pleural Effusion -weaned to RA  Hypokalemia- -repleted  Small pulmonary nodules-found on ct chest 03/08/20 Needs f/u CT in one year to assess for stability  Acute Metabolic Encephalopathy>>resolved Generalized Tonic-Clonic Seizure Has first-time seizure in the ED. This is provoked episodes in the setting of significant pancreatitis with lactic acidosis. EEG -received a mild diffuse encephalopathy, nonspecific etiology. Possibly toxic metabolic etiology. No seizure or definite epileptiform discharges were seen  Per neurology keppra was d/c'd No indication for AED at this time Neurology signed off  infrarenal abdominal aortic aneurysm  Measuring up to 31 mm. Recommend follow-up every 3 years.   Acute Kidney Injury Resolved Avoid nephrotoxic agents  Hyperglycemia- improved off D5 A1c 5.8 -will monitor with accuchecks-- patient to bring to PCP the log of  results      Medical Consultants:  GI ID neurology    Discharge Exam:   Vitals:   03/14/20 0451 03/14/20 0736  BP: 127/69  (!) 153/70  Pulse: 81 82  Resp: 16 18  Temp: 98.3 F (36.8 C) (!) 97.5 F (36.4 C)  SpO2: 94% 92%   Vitals:   03/13/20 2118 03/14/20 0121 03/14/20 0451 03/14/20 0736  BP: (!) 131/50 (!) 142/62 127/69 (!) 153/70  Pulse: 84 85 81 82  Resp: _0 Temp: 98.9 F (37.2 C) 98.6 F (37 C) 98.3 F (36.8 C) (!) 97.5 F (36.4 C)  TempSrc: Oral  Oral Oral  SpO2: 93% 94% 94% 92%  Weight:   67.1 kg   Height:        General exam: Appears calm and comfortable   The results of significant diagnostics from this hospitalization (including imaging, microbiology, ancillary and laboratory) are listed below for reference.     Procedures and Diagnostic Studies:   DG Chest Port 1 View  Result Date: 03/02/2020 CLINICAL DATA:  Severe sharp abdominal pain associated with nausea and cramping, single episode of vomiting. History hypertension, hyperlipidemia, gastric ulcer EXAM: PORTABLE CHEST 1 VIEW COMPARISON:  Portable exam 0958 hours compared to 02/29/2020 FINDINGS: Normal heart size, mediastinal contours, and pulmonary vascularity. LEFT pleural effusion new since previous exam with LEFT basilar atelectasis. Subsegmental atelectasis at RIGHT base as well. Upper lungs clear. No pneumothorax. Bones demineralized. IMPRESSION: New LEFT pleural effusion with bibasilar atelectasis greater on LEFT. Electronically Signed   By: Lavonia Dana M.D.   On: 03/02/2020 10:34   EEG adult  Result Date: 03/03/2020 Lora Havens, MD     03/03/2020  4:04 PM Patient Name: Holly Navarro MRN: [id number] Epilepsy Attending: Lora Havens Referring Physician/Provider: Dr Nelle Don Date: 03/03/2020 Duration: 21.32 mins Patient history: 75yo F with ams and seizure like activity. EEG to evaluate for seizure Level of alertness: Awake AEDs during EEG study: LEV Technical aspects: This EEG study was done with scalp electrodes positioned according to the 10-20 International system of electrode placement. Electrical activity  was acquired at a sampling rate of 500Hz and reviewed with a high frequency filter of 70Hz and a low frequency filter of 1Hz. EEG data were recorded continuously and digitally stored. Description: The posterior dominant rhythm consists of 9 Hz activity of moderate voltage (25-35 uV) seen predominantly in posterior head regions, symmetric and reactive to eye opening and eye closing.EEG showed intermittent generalized 3 to 6 Hz theta-delta slowing. Intermittent generalized periodic discharges with triphasic morphology were noted at [place]. Hyperventilation and photic stimulation were not performed.   ABNORMALITY -Continuous slow, generalized - Periodic  Discharges with triphasic morphology, generalized IMPRESSION: This study is suggestive of mild diffuse encephalopathy, nonspecific etiology. Intermittent generalized periodic discharges with triphasic morphology were noted at [place] which can be on the ictal-interictal continuum but given the morphology and frequency, are more likely related to toxic-metabolic etiology. No seizures or definite epileptiform discharges were seen throughout the recording. Lora Havens   ECHOCARDIOGRAM COMPLETE  Result Date: 03/02/2020    ECHOCARDIOGRAM REPORT   Patient Name:   York Ram Date of Exam: 03/02/2020 Medical Rec #:  [id number]      Height:       66.0 in Accession #:    [id number]     Weight:       156.3 lb Date of Birth:  [birth year]      BSA:  1.801 m Patient Age:    68 years       BP:           144/68 mmHg Patient Gender: F              HR:           103 bpm. Exam Location:  [facility] Procedure: 2D Echo, Color Doppler and Cardiac Doppler Indications:     I50.31 CHF-Acute Diastolic  History:         Patient has no prior history of Echocardiogram examinations.                  Risk Factors:Hypertension, Dyslipidemia and Current Smoker.  Sonographer:     Charmayne Sheer RDCS (AE) Referring Phys:  [id number] DONALD BRESCIA Diagnosing Phys: Kathlyn Sacramento MD  Sonographer Comments:  Suboptimal parasternal window and suboptimal subcostal window. Image acquisition challenging due to respiratory motion. IMPRESSIONS  1. Left ventricular ejection fraction, by estimation, is 65 to 70%. The left ventricle has normal function. The left ventricle has no regional wall motion abnormalities. Left ventricular diastolic parameters are consistent with Grade I diastolic dysfunction (impaired relaxation).  2. Right ventricular systolic function is normal. The right ventricular size is normal. Tricuspid regurgitation signal is inadequate for assessing PA pressure.  3. The mitral valve is normal in structure. No evidence of mitral valve regurgitation. No evidence of mitral stenosis.  4. The aortic valve is normal in structure. Aortic valve regurgitation is not visualized. Mild aortic valve stenosis. Aortic valve area, by VTI measures 2.07 cm. Aortic valve mean gradient measures 6.0 mmHg.  5. The inferior vena cava is normal in size with greater than 50% respiratory variability, suggesting right atrial pressure of 3 mmHg. FINDINGS  Left Ventricle: Left ventricular ejection fraction, by estimation, is 65 to 70%. The left ventricle has normal function. The left ventricle has no regional wall motion abnormalities. The left ventricular internal cavity size was normal in size. There is  no left ventricular hypertrophy. Left ventricular diastolic parameters are consistent with Grade I diastolic dysfunction (impaired relaxation). Right Ventricle: The right ventricular size is normal. No increase in right ventricular wall thickness. Right ventricular systolic function is normal. Tricuspid regurgitation signal is inadequate for assessing PA pressure. Left Atrium: Left atrial size was normal in size. Right Atrium: Right atrial size was normal in size. Pericardium: There is no evidence of pericardial effusion. Mitral Valve: The mitral valve is normal in structure. No evidence of mitral valve regurgitation. No evidence of  mitral valve stenosis. MV peak gradient, 8.2 mmHg. The mean mitral valve gradient is 3.0 mmHg. Tricuspid Valve: The tricuspid valve is normal in structure. Tricuspid valve regurgitation is not demonstrated. No evidence of tricuspid stenosis. Aortic Valve: The aortic valve is normal in structure. Aortic valve regurgitation is not visualized. Mild aortic stenosis is present. Aortic valve mean gradient measures 6.0 mmHg. Aortic valve peak gradient measures 10.8 mmHg. Aortic valve area, by VTI measures 2.07 cm. Pulmonic Valve: The pulmonic valve was normal in structure. Pulmonic valve regurgitation is not visualized. No evidence of pulmonic stenosis. Aorta: The aortic root is normal in size and structure. Venous: The inferior vena cava is normal in size with greater than 50% respiratory variability, suggesting right atrial pressure of 3 mmHg. IAS/Shunts: No atrial level shunt detected by color flow Doppler.  LEFT VENTRICLE PLAX 2D LVIDd:         3.60 cm  Diastology LVIDs:         1.80  cm  LV e' medial:    8.59 cm/s LV PW:         0.70 cm  LV E/e' medial:  9.0 LV IVS:        0.60 cm  LV e' lateral:   12.80 cm/s LVOT diam:     1.90 cm  LV E/e' lateral: 6.0 LV SV:         48 LV SV Index:   27 LVOT Area:     2.84 cm  RIGHT VENTRICLE RV Basal diam:  2.80 cm LEFT ATRIUM             Index LA Vol (A2C):   26.0 ml 14.44 ml/m LA Vol (A4C):   39.5 ml 21.93 ml/m LA Biplane Vol: 35.1 ml 19.49 ml/m  AORTIC VALVE                    PULMONIC VALVE AV Area (Vmax):    1.99 cm     PV Vmax:       1.04 m/s AV Area (Vmean):   2.02 cm     PV Vmean:      63.100 cm/s AV Area (VTI):     2.07 cm     PV VTI:        0.149 m AV Vmax:           164.00 cm/s  PV Peak grad:  4.3 mmHg AV Vmean:          108.000 cm/s PV Mean grad:  2.0 mmHg AV VTI:            0.234 m AV Peak Grad:      10.8 mmHg AV Mean Grad:      6.0 mmHg LVOT Vmax:         115.00 cm/s LVOT Vmean:        77.000 cm/s LVOT VTI:          0.171 m LVOT/AV VTI ratio: 0.73  AORTA Ao  Root diam: 2.60 cm MITRAL VALVE MV Area (PHT): 12.64 cm    SHUNTS MV Area VTI:   2.39 cm     Systemic VTI:  0.17 m MV Peak grad:  8.2 mmHg     Systemic Diam: 1.90 cm MV Mean grad:  3.0 mmHg MV Vmax:       1.43 m/s MV Vmean:      85.4 cm/s MV Decel Time: 60 msec MV E velocity: 77.10 cm/s MV A velocity: 111.00 cm/s MV E/A ratio:  0.69 Kathlyn Sacramento MD Electronically signed by Kathlyn Sacramento MD Signature Date/Time: 03/02/2020/11:15:34 AM    Final      Labs:   Basic Metabolic Panel: Recent Labs  Lab 03/08/20 0445 03/09/20 0449 03/10/20 0628 03/11/20 0454 03/12/20 0535  NA 138 136  --  136 138  K 3.6 3.4*   < > 3.6 3.9  CL 104 101  --  104 107  CO2 26 25  --  26 25  GLUCOSE 139* 140*  --  208* 118*  BUN 7* 8  --  7* 6*  CREATININE 0.93 1.06*  --  1.04* 0.96  CALCIUM 8.3* 8.3*  --  8.2* 8.4*  PHOS  --  3.2  --   --   --    < > = values in this interval not displayed.   GFR Estimated Creatinine Clearance: 48.1 mL/min (by C-G formula based on SCr of 0.96 mg/dL). Liver Function Tests: Recent Labs  Lab 03/09/20 [id number]  03/10/20 0628  AST  --  26  ALT  --  28  ALKPHOS  --  130*  BILITOT  --  1.0  PROT  --  6.2*  ALBUMIN 2.5* 2.3*   No results for input(s): LIPASE, AMYLASE in the last 168 hours. No results for input(s): AMMONIA in the last 168 hours. Coagulation profile No results for input(s): INR, PROTIME in the last 168 hours.  CBC: Recent Labs  Lab 03/09/20 0449 03/10/20 0628 03/11/20 0454 03/12/20 0535 03/13/20 0828  WBC 23.8* 17.9* 14.0* 11.4* 10.5  HGB 10.6* 10.5* 10.1* 10.4* 9.8*  HCT 31.6* 31.6* 31.0* 31.2* 30.2*  MCV 93.5 94.0 94.8 94.5 94.4  PLT 439* 506* 524* 581* 577*   Cardiac Enzymes: No results for input(s): CKTOTAL, CKMB, CKMBINDEX, TROPONINI in the last 168 hours. BNP: Invalid input(s): POCBNP CBG: Recent Labs  Lab 03/13/20 0737 03/13/20 1128 03/13/20 1620 03/13/20 2118 03/14/20 0755  GLUCAP 150* 97 193* 100* 112*   D-Dimer No results for  input(s): DDIMER in the last 72 hours. Hgb A1c No results for input(s): HGBA1C in the last 72 hours. Lipid Profile No results for input(s): CHOL, HDL, LDLCALC, TRIG, CHOLHDL, LDLDIRECT in the last 72 hours. Thyroid function studies No results for input(s): TSH, T4TOTAL, T3FREE, THYROIDAB in the last 72 hours.  Invalid input(s): FREET3 Anemia work up No results for input(s): VITAMINB12, FOLATE, FERRITIN, TIBC, IRON, RETICCTPCT in the last 72 hours. Microbiology Recent Results (from the past 240 hour(s))  Urine Culture     Status: None   Collection Time: 03/05/20  9:16 AM   Specimen: Urine, Catheterized  Result Value Ref Range Status   Specimen Description   Final    URINE, CATHETERIZED Performed at [facility], [address] [place], [place] [address]    Special Requests   Final    NONE Performed at [facility], [address], [place], [place] [address]    Culture   Final    NO GROWTH Performed at [facility], [place] [address] [place], [place] [address]    Report Status 03/06/2020 FINAL  Final  CULTURE, BLOOD (ROUTINE X 2) w Reflex to ID Panel     Status: None   Collection Time: 03/05/20 10:02 AM   Specimen: BLOOD  Result Value Ref Range Status   Specimen Description BLOOD LEFT [facility]  Final   Special Requests   Final    BOTTLES DRAWN AEROBIC AND ANAEROBIC Blood Culture adequate volume   Culture   Final    NO GROWTH 5 DAYS Performed at [facility], [address] [place], [place] [address]    Report Status 03/10/2020 FINAL  Final  CULTURE, BLOOD (ROUTINE X 2) w Reflex to ID Panel     Status: None   Collection Time: 03/05/20 10:03 AM   Specimen: BLOOD  Result Value Ref Range Status   Specimen Description BLOOD [facility]  Final   Special Requests   Final    BOTTLES DRAWN AEROBIC AND ANAEROBIC Blood Culture adequate volume   Culture   Final    NO GROWTH 5 DAYS Performed at [facility], [address] [place], [place]  [address]    Report Status 03/10/2020 FINAL  Final  CULTURE, BLOOD (ROUTINE X 2) w Reflex to ID Panel     Status: None   Collection Time: 03/09/20 12:22 PM   Specimen: BLOOD  Result Value Ref Range Status   Specimen Description BLOOD LEFT WRIST  Final  Special Requests   Final    BOTTLES DRAWN AEROBIC AND ANAEROBIC Blood Culture adequate volume   Culture   Final    NO GROWTH 5 DAYS Performed at [facility], [address] [place], [place] [address]    Report Status 03/14/2020 FINAL  Final  CULTURE, BLOOD (ROUTINE X 2) w Reflex to ID Panel     Status: None   Collection Time: 03/09/20 12:24 PM   Specimen: BLOOD  Result Value Ref Range Status   Specimen Description BLOOD BLOOD RIGHT HAND  Final   Special Requests   Final    BOTTLES DRAWN AEROBIC AND ANAEROBIC Blood Culture adequate volume   Culture   Final    NO GROWTH 5 DAYS Performed at [facility], [address] [place], [place] [address]    Report Status 03/14/2020 FINAL  Final     Discharge Instructions:   Discharge Instructions    Discharge instructions   Complete by: As directed    small frequent meals, low-fat, low-carb and high-protein diet Check blood sugars in AM prior to eating one day and 1-2 hours after a meal the next--- record numbers to bring to PCP If consistently >200 call PCP   Increase activity slowly   Complete by: As directed      Allergies as of 03/14/2020   No Known Allergies     Medication List    STOP taking these medications   cimetidine 200 MG tablet Commonly known as: TAGAMET   Coricidin HBP Congestion/Cough 10-200 MG Caps Generic drug: Dextromethorphan-guaiFENesin   cyclobenzaprine 10 MG tablet Commonly known as: FLEXERIL   diphenhydrAMINE 25 mg capsule Commonly known as: BENADRYL   ECHINACEA C COMPLETE PO   hydrocortisone 2.5 % cream   lisinopril 10 MG tablet Commonly known as: ZESTRIL   milk thistle 175 MG tablet   ROBITUSSIN MULTI-SYMPTOM MAX  PO   rosuvastatin 10 MG tablet Commonly known as: CRESTOR     TAKE these medications   atenolol 50 MG tablet Commonly known as: TENORMIN Take 1 tablet (50 mg total) by mouth daily.   blood glucose meter kit and supplies Kit Dispense based on patient and insurance preference. Use up to four times daily as directed.   co-enzyme Q-10 30 MG capsule Take 30 mg by mouth 3 (three) times daily.   fluticasone 50 MCG/ACT nasal spray Commonly known as: FLONASE Place 2 sprays into both nostrils daily.   HYDROcodone-acetaminophen 5-325 MG tablet Commonly known as: NORCO/VICODIN Take 1-2 tablets by mouth every 6 (six) hours as needed for moderate pain.   lipase/protease/amylase 36000 UNITS Cpep capsule Commonly known as: CREON Take 2 capsules (72,000 Units total) by mouth 3 (three) times daily with meals. Take 2 capsules (72,000 Units total) by mouth 3 (three) times daily with meals and 36000 with snack   omega-3 acid ethyl esters 1 g capsule Commonly known as: LOVAZA Take by mouth 2 (two) times daily.   OVER THE COUNTER MEDICATION Take 3 capsules by mouth daily. [place] Formula   pantoprazole 40 MG tablet Commonly known as: PROTONIX Take 1 tablet (40 mg total) by mouth at bedtime.            Durable Medical Equipment  (From admission, onward)         Start     Ordered   03/09/20 1645  For home use only DME 3 n 1  Once        03/09/20 1644  03/09/20 1644  For home use only DME Walker rolling  Once       Question Answer Comment  Walker: With Isabela Wheels   Patient needs a walker to treat with the following condition Weakness      03/09/20 1644          Follow-up Information    Malfi, Lupita Raider, FNP Follow up in 1 week(s).   Specialty: Family Medicine Contact information: [place] [address] [phone]        Lin Landsman, MD Follow up.   Specialty: Gastroenterology Contact information: [place] [address] [phone]                Time coordinating discharge: 35 min  Signed:  Geradine Girt DO  [facility] 03/14/2020, 9:54 AM

## 2020-03-16 ENCOUNTER — Telehealth: Payer: Self-pay

## 2020-03-16 NOTE — Telephone Encounter (Signed)
Sounds good to me, thank you. We can do labs after visit.  Saralyn Pilar, DO [facility] 03/16/2020, 9:58 AM

## 2020-03-16 NOTE — Telephone Encounter (Signed)
Called patient son and made appointment 03/30/2020

## 2020-03-16 NOTE — Telephone Encounter (Signed)
Copied from CRM [id number]. Topic: General - Other >> Mar 16, 2020  9:19 AM Gwenlyn Fudge wrote: Reason for CRM: Pts son called and is requesting to have the pt seen this week due to recent hospitalization. He states that pt cannot wait until next available with Gabriel Cirri. He is also requesting to have orders placed for lab work before the appt. Please advise.

## 2020-03-16 NOTE — Telephone Encounter (Signed)
The pt called requesting a sooner hospital f/u. She is concern because she having some elevated blood sugar reading ranging around 185-249 mg/dl since her recent discharge from the hospital. She denies any symptoms associated with elevated blood sugars. I scheduled the patient for tomorrow in a 40 minute slot because she is a new patient to you and hospital f/u with a problem. Please advise if that is okay.

## 2020-03-16 NOTE — Telephone Encounter (Signed)
-----   Message from [name], MD sent at 03/13/2020  1:51 PM EST ----- Regarding: Hospital follow-up, acute pancreatitis Holly Navarro  Please schedule follow-up to see me in 4 weeks, okay to overbook  Thanks RV

## 2020-03-17 ENCOUNTER — Ambulatory Visit (INDEPENDENT_AMBULATORY_CARE_PROVIDER_SITE_OTHER): Payer: Self-pay | Admitting: Family Medicine

## 2020-03-17 ENCOUNTER — Other Ambulatory Visit: Payer: Self-pay

## 2020-03-17 ENCOUNTER — Encounter: Payer: Self-pay | Admitting: Family Medicine

## 2020-03-17 ENCOUNTER — Telehealth: Payer: Self-pay | Admitting: Gastroenterology

## 2020-03-17 VITALS — BP 134/66 | HR 77 | Ht 66.0 in | Wt 142.0 lb

## 2020-03-17 DIAGNOSIS — I10 Essential (primary) hypertension: Secondary | ICD-10-CM

## 2020-03-17 DIAGNOSIS — K8591 Acute pancreatitis with uninfected necrosis, unspecified: Secondary | ICD-10-CM

## 2020-03-17 DIAGNOSIS — R918 Other nonspecific abnormal finding of lung field: Secondary | ICD-10-CM | POA: Insufficient documentation

## 2020-03-17 DIAGNOSIS — I714 Abdominal aortic aneurysm, without rupture, unspecified: Secondary | ICD-10-CM | POA: Insufficient documentation

## 2020-03-17 DIAGNOSIS — R7989 Other specified abnormal findings of blood chemistry: Secondary | ICD-10-CM

## 2020-03-17 DIAGNOSIS — K8681 Exocrine pancreatic insufficiency: Secondary | ICD-10-CM

## 2020-03-17 DIAGNOSIS — R739 Hyperglycemia, unspecified: Secondary | ICD-10-CM

## 2020-03-17 MED ORDER — ZENPEP 40000-126000 UNITS PO CPEP: ORAL_CAPSULE | ORAL | 0 refills | Status: DC

## 2020-03-17 NOTE — Telephone Encounter (Signed)
Sent in medication  To the pharmacy. Patient son will see how much the medication is

## 2020-03-17 NOTE — Telephone Encounter (Signed)
Holly Navarro  Let us try Zenpep 40K 2 capsules with each meal and 1 with snack  Associated diagnosis as exocrine pancreatic insufficiency and acute necrotizing pancreatitis  Thanks RV

## 2020-03-17 NOTE — Telephone Encounter (Signed)
Please advised  

## 2020-03-17 NOTE — Addendum Note (Signed)
Addended by: Radene Knee L on: 03/17/2020 01:20 PM   Modules accepted: Orders

## 2020-03-17 NOTE — Patient Instructions (Addendum)
Thank you for coming to the office today.  You have elevated blood sugar due to pancreas no longer contributing with insulin. It is okay to have higher sugar temporarily ranging 150-180 approximately, okay if under 200. Check 2 hours post meal if you are going to check in afternoon, otherwise check in morning fasting only - in future we may need consult an Endocrinologist option such as treating with Insulin  Please keep following up with Dr Allegra Lai on the pancreatic enzymes  Keep on current nutrition plan Glucerna, V8, G2 Gatorade  Restart Lisinopril 10mg  daily  Keep on Atenolol - but can resume at HALF pill daily for now, keep an eye on blood pressure, as long as >120/80, may resume full dose after 1-2 weeks. If BP is too low, < 100/70 then can keep on the half dose or contact .  Gradually increase diet as tolerated.  Small pulmonary nodules measuring up to 6 mm. Recommend follow-up CT in 1 year to assess for stability.  We will check into [facility] for home health, if unable to arrange, we can try to place orders as well.  Please schedule a Follow-up Appointment to: Return in about 2 months (around 05/17/2020) for 2 month with new provider.  If you have any other questions or concerns, please feel free to call the office or send a message through MyChart. You may also schedule an earlier appointment if necessary.  Additionally, you may be receiving a survey about your experience at our office within a few days to 1 week by e-mail or mail. We value your feedback.  05/19/2020, DO [facility], [facility]   Pancreatitis Eating Plan Pancreatitis is when your pancreas becomes irritated and swollen (inflamed). The pancreas is a small organ located behind your stomach. It helps your body digest food and regulate your blood sugar. Pancreatitis can affect how your body digests food, especially foods with fat. You may also have other symptoms such as abdominal pain or  nausea. When you have pancreatitis, following a low-fat eating plan may help you manage symptoms and recover more quickly. Work with your health care provider or a diet and nutrition specialist (dietitian) to create an eating plan that is right for you. What are tips for following this plan? Reading food labels Use the information on food labels to help keep track of how much fat you eat:  Check the serving size.  Look for the amount of total fat in grams (g) in one serving. ? Low-fat foods have 3 g of fat or less per serving. ? Fat-free foods have 0.5 g of fat or less per serving.  Keep track of how much fat you eat based on how many servings you eat. ? For example, if you eat two servings, the amount of fat you eat will be two times what is listed on the label. Shopping  Buy low-fat or nonfat foods, such as: ? Fresh, frozen, or canned fruits and vegetables. ? Grains, including pasta, bread, and rice. ? Lean meat, poultry, fish, and other protein foods. ? Low-fat or nonfat dairy.  Avoid buying bakery products and other sweets made with whole milk, butter, and eggs.  Avoid buying snack foods with added fat, such as anything with butter or cheese flavoring.   Cooking  Remove skin from poultry, and remove extra fat from meat.  Limit the amount of fat and oil you use to 6 teaspoons or less per day.  Cook using low-fat methods, such as boiling,  broiling, grilling, steaming, or baking.  Use spray oil to cook. Add fat-free chicken broth to add flavor and moisture.  Avoid adding cream to thicken soups or sauces. Use other thickeners such as corn starch or tomato paste. Meal planning  Eat a low-fat diet as told by your dietitian. For most people, this means having no more than 55-65 grams of fat each day.  Eat small, frequent meals throughout the day. For example, you may have 5-6 small meals instead of 3 large meals.  Drink enough fluid to keep your urine pale yellow.  Do not  drink alcohol. Talk to your health care provider if you need help stopping.  Limit how much caffeine you have, including black coffee, black and green tea, caffeinated soft drinks, and energy drinks.   General information  Let your health care provider or dietitian know if you have unplanned weight loss on this eating plan.  You may be instructed to follow a clear liquid diet during a flare of symptoms. Talk with your health care provider about how to manage your diet during symptoms of a flare.  Take any vitamins or supplements as told by your health care provider.  Work with a Data processing manager, especially if you have other conditions such as obesity or diabetes mellitus. What foods should I avoid? Fruits Fried fruits. Fruits served with butter or cream. Vegetables Fried vegetables. Vegetables cooked with butter, cheese, or cream. Grains Biscuits, waffles, donuts, pastries, and croissants. Pies and cookies. Butter-flavored popcorn. Regular crackers. Meats and other protein foods Fatty cuts of meat. Poultry with skin. Organ meats. Bacon, sausage, and cold cuts. Whole eggs. Nuts and nut butters. Dairy Whole and 2% milk. Whole milk yogurt. Whole milk ice cream. Cream and half-and-half. Cream cheese. Sour cream. Cheese. Beverages Wine, beer, and liquor. The items listed above may not be a complete list of foods and beverages to avoid. Contact a dietitian for more information. Summary  Pancreatitis can affect how your body digests food, especially foods with fat.  When you have pancreatitis, it is recommended that you follow a low-fat eating plan to help you recover more quickly and manage symptoms. For most people, this means limiting fat to no more than 55-65 grams per day.  Do not drink alcohol. Limit the amount of caffeine you have, and drink enough fluid to keep your urine pale yellow. This information is not intended to replace advice given to you by your health care provider. Make sure  you discuss any questions you have with your health care provider. Document Revised: 04/12/2018 Document Reviewed: 03/28/2017 Elsevier Patient Education  2021 [employer].

## 2020-03-17 NOTE — Telephone Encounter (Signed)
lipase/protease/amylase (CREON) 36000 UNITS CPEP capsule. Extremely expensive. What else can she take? Please call patient.  Will run out in 6 day.

## 2020-03-17 NOTE — Progress Notes (Signed)
Subjective:    Patient ID: Holly Navarro, female    DOB: 28-Jan-1945, 75 y.o.   MRN: [id number]  Holly Navarro is a 75 y.o. female presenting on 03/17/2020 for Hospitalization Follow-up and Pancreatitis  Previous PCP Cassell Smiles, AGPCNP-BC and then Cyndia Skeeters, FNP  Son, Mitzi Hansen, from [place].  HPI  HOSPITAL FOLLOW-UP VISIT  Hospital/Location: [facility] Date of Admission: 02/29/20 Date of Discharge: 03/14/20 Transitions of care telephone call: Not completed.  Reason for Admission: Acute pancreatitis Primary (+Secondary) Diagnosis: Acute necrotizing pancreatitis, acute hypoxic respiratory failure  - Hospital H&P and Discharge Summary have been reviewed - Patient presents today 3 days after recent hospitalization. Brief summary of recent course, patient had symptoms of acute GI illness with abdominal pain nausea vomiting and constellation of symptoms syncopal episode, hospitalized, treated with IV rehydration, management for sepsis broad antibiotics, and discontinued later, she had IR intervention but no fluid to aspirate on imaging, has had ultrasound and CT imaging. Notable improvement with acute metabolic encephalopathy and had seizure like episode provoked by acute illness, then resolved. Also had AKI - held her medications at that time during hospitalization, given lower dose Atenolol on discharge. And asked to follow-up with labs.  Upcoming outpatient apt with [facility] 03/30/20 - Dr Thalia Party [facility] for hospital follow-up.  Interval update, there were concerns about pancreatic enzymes, original order from hospital was high cost, and they contacted GI already and it was changed for lower cost but still very high cost and they are not going to be able to afford it as well.  - Today reports overall has done well after discharge. Symptoms of acute abdominal symptoms and acute GI symptoms. Today she is doing well on liquid diet now and is on Glucerna, water, gatorade and  supplement.  Additionally high sugars, concerns raised with elevated readings 150-250 range. Seems the 200+ were taken soon after eating. Now they are still keeping track. No prior Diabetes or PreDiabetes diagnosis, but was told due to pancreatitis it would impact her sugar. Not on medication for it  They held Simvastatin, Lisinopril in hospital.  Additionally [facility] was intended to be setup on discharge, however they have not arranged this yet from the company, they are waiting on contact follow-up from them.  Additional update with CT imaging showed infrarenal abdominal aortic aneurysm/pulmonary nodule, they were asked to follow-up imaging within 1 year for nodule   I have reviewed the discharge medication list, and have reconciled the current and discharge medications today.   Current Outpatient Medications:  .  atenolol (TENORMIN) 50 MG tablet, Take 1 tablet (50 mg total) by mouth daily., Disp: 90 tablet, Rfl: 1 .  blood glucose meter kit and supplies KIT, Dispense based on patient and insurance preference. Use up to four times daily as directed., Disp: 1 each, Rfl: 0 .  co-enzyme Q-10 30 MG capsule, Take 30 mg by mouth 3 (three) times daily., Disp: , Rfl:  .  fluticasone (FLONASE) 50 MCG/ACT nasal spray, Place 2 sprays into both nostrils daily., Disp: 15.8 mL, Rfl: 2 .  lipase/protease/amylase (CREON) 36000 UNITS CPEP capsule, Take 2 capsules (72,000 Units total) by mouth 3 (three) times daily with meals. Take 2 capsules (72,000 Units total) by mouth 3 (three) times daily with meals and 36000 with snack, Disp: 270 capsule, Rfl: 1 .  lisinopril (ZESTRIL) 10 MG tablet, Take 10 mg by mouth daily., Disp: , Rfl:  .  omega-3 acid ethyl esters (LOVAZA) 1 G capsule, Take  by mouth 2 (two) times daily., Disp: , Rfl:  .  OVER THE COUNTER MEDICATION, Take 3 capsules by mouth daily. [place] Formula, Disp: , Rfl:  .  Pancrelipase, Lip-Prot-Amyl, (ZENPEP)  40000-126000 units CPEP, Take 2 capsule with the first bite of each meal and 1 capsule with the first bite of each snack, Disp: 240 capsule, Rfl: 0 .  pantoprazole (PROTONIX) 40 MG tablet, Take 1 tablet (40 mg total) by mouth at bedtime., Disp: 30 tablet, Rfl: 0 .  rosuvastatin (CRESTOR) 10 MG tablet, Take 10 mg by mouth daily., Disp: , Rfl:   ------------------------------------------------------------------------- Social History   Tobacco Use  . Smoking status: Current Every Day Smoker    Packs/day: 0.50    Types: Cigarettes  . Smokeless tobacco: Never Used  Vaping Use  . Vaping Use: Never used  Substance Use Topics  . Alcohol use: Yes    Alcohol/week: 0.0 standard drinks    Comment: occasionally   . Drug use: No    Review of Systems Per HPI unless specifically indicated above     Objective:    BP 134/66   Pulse 77   Ht _0  (1.676 m)   Wt 142 lb (64.4 kg)   SpO2 97%   BMI 22.92 kg/m   Wt Readings from Last 3 Encounters:  03/17/20 142 lb (64.4 kg)  03/14/20 147 lb 14.9 oz (67.1 kg)  12/11/19 148 lb (67.1 kg)    Physical Exam Vitals and nursing note reviewed.  Constitutional:      General: She is not in acute distress.    Appearance: She is well-developed. She is not diaphoretic.     Comments: Well-appearing, comfortable, cooperative  HENT:     Head: Normocephalic and atraumatic.  Eyes:     General:        Right eye: No discharge.        Left eye: No discharge.     Conjunctiva/sclera: Conjunctivae normal.  Neck:     Thyroid: No thyromegaly.  Cardiovascular:     Rate and Rhythm: Normal rate and regular rhythm.     Heart sounds: Normal heart sounds. No murmur heard.   Pulmonary:     Effort: Pulmonary effort is normal. No respiratory distress.     Breath sounds: Normal breath sounds. No wheezing or rales.  Musculoskeletal:        General: Normal range of motion.     Cervical back: Normal range of motion and neck supple.  Lymphadenopathy:     Cervical:  No cervical adenopathy.  Skin:    General: Skin is warm and dry.     Findings: No erythema or rash.  Neurological:     Mental Status: She is alert and oriented to person, place, and time.  Psychiatric:        Behavior: Behavior normal.     Comments: Well groomed, good eye contact, normal speech and thoughts      I have personally reviewed the radiology report from 03/10/20 CT.  CLINICAL DATA:  Abdominal pain  EXAM: CT CHEST, ABDOMEN, AND PELVIS WITH CONTRAST  TECHNIQUE: Multidetector CT imaging of the chest, abdomen and pelvis was performed following the standard protocol during bolus administration of intravenous contrast.  CONTRAST:  116m OMNIPAQUE IOHEXOL 300 MG/ML  SOLN  COMPARISON:  February 29, 2020  FINDINGS: CT CHEST FINDINGS  Cardiovascular: Heart is normal in size. Predominately LEFT-sided coronary artery atherosclerotic calcifications. Calcifications of the aortic valve. Soft and calcified plaque  throughout the aorta.  Mediastinum/Nodes: Thyroid is unremarkable. No axillary or mediastinal adenopathy.  Lungs/Pleura: Small LEFT pleural effusion, increased in comparison to prior. Biapical irregular opacities likely scar. Centrilobular emphysema, moderate. RIGHT lower lobe pulmonary nodule measures 6 x 5 mm (series 2, image 24). RIGHT middle lobe pulmonary nodule measures 5 mm (series 4, image 98). LEFT basilar atelectasis.  Musculoskeletal: Degenerative changes of the thoracic spine.  CT ABDOMEN PELVIS FINDINGS  Hepatobiliary: Focal fatty deposition adjacent to the falciform ligament. Portal vein is patent. Gallbladder is mildly distended. Trace adjacent pericholecystic fluid, likely reactive.  Pancreas: Majority of the pancreas is hypoattenuating with minimal normally enhancing pancreas at the uncinate process and a small focus at the tail. There is exuberant adjacent fat stranding and a small amount of adjacent fluid. No definitive rim  enhancing organized collection. No focal drainable fluid collection at this point in time. No definitive evidence of pseudoaneurysm formation at this point in time.  Spleen: Unremarkable. Splenic vein is patent. Splenic artery is patent.  Adrenals/Urinary Tract: Adrenal glands are unremarkable. Kidneys enhance symmetrically. No hydronephrosis. Bladder is unremarkable.  Stomach/Bowel: No evidence of bowel obstruction. Mild prominence of the small bowel is likely due to mild ileus from inflammatory changes. Appendix is normal. Mild bowel wall thickening of the transverse colon and splenic flexure, likely reactive in etiology.  Vascular/Lymphatic: Revisualization of an infrarenal abdominal aortic aneurysm measuring up to 31 mm. Severe soft and calcified plaque throughout the aorta.  Reproductive: Fibroid uterus.  Prominent parametrial vessels.  Other: Small volume free fluid throughout the abdomen with evolving post inflammatory changes along the pericolic gutters.  Musculoskeletal: Degenerative changes of the lumbar spine. Levocurvature of the lumbar spine.  IMPRESSION: 1. Revisualization of necrotic pancreatitis with exuberant adjacent fat stranding and scattered free fluid throughout the abdomen. No focal drainable fluid collection or pseudoaneurysm at this point in time. 2. Small LEFT pleural effusion, increased in comparison to prior. 3. Likely reactive mild ileus and bowel wall thickening of the colon. No evidence of bowel obstruction. 4. Small pulmonary nodules measuring up to 6 mm. Recommend follow-up CT in 1 year to assess for stability. 5. Revisualization of infrarenal abdominal aortic aneurysm measuring up to 31 mm. Recommend follow-up every 3 years. This recommendation follows ACR consensus guidelines: White Paper of the ACR Incidental Findings Committee II on Vascular Findings. J Am Coll Radiol 2013; 10:789-794.  Aortic Atherosclerosis (ICD10-I70.0) and  Emphysema (ICD10-J43.9).   Electronically Signed   By: Valentino Saxon MD   On: 03/08/2020 15:48    Results for orders placed or performed during the hospital encounter of 02/29/20  Resp Panel by RT-PCR (Flu A&B, Covid) Nasopharyngeal Swab   Specimen: Nasopharyngeal Swab; Nasopharyngeal(NP) swabs in vial transport medium  Result Value Ref Range   SARS Coronavirus 2 by RT PCR NEGATIVE NEGATIVE   Influenza A by PCR NEGATIVE NEGATIVE   Influenza B by PCR NEGATIVE NEGATIVE  Culture, blood (Routine X 2) w Reflex to ID Panel   Specimen: BLOOD  Result Value Ref Range   Specimen Description BLOOD RIGHT ANTECUBITAL    Special Requests      BOTTLES DRAWN AEROBIC AND ANAEROBIC Blood Culture adequate volume   Culture      NO GROWTH 5 DAYS Performed at [facility], [address] [place],  [address]    Report Status 03/05/2020 FINAL   Culture, blood (Routine X 2) w Reflex to ID Panel   Specimen: BLOOD  Result Value Ref Range   Specimen  Description BLOOD BLOOD RIGHT HAND    Special Requests      BOTTLES DRAWN AEROBIC AND ANAEROBIC Blood Culture results may not be optimal due to an inadequate volume of blood received in culture bottles   Culture      NO GROWTH 5 DAYS Performed at [facility], [address] [place], [place] [address]    Report Status 03/05/2020 FINAL   MRSA PCR Screening   Specimen: Nasopharyngeal  Result Value Ref Range   MRSA by PCR NEGATIVE NEGATIVE  CULTURE, BLOOD (ROUTINE X 2) w Reflex to ID Panel   Specimen: BLOOD  Result Value Ref Range   Specimen Description BLOOD LEFT AC    Special Requests      BOTTLES DRAWN AEROBIC AND ANAEROBIC Blood Culture adequate volume   Culture      NO GROWTH 5 DAYS Performed at [facility], [address] [place], [place] [address]    Report Status 03/10/2020 FINAL   CULTURE, BLOOD (ROUTINE X 2) w Reflex to ID Panel   Specimen: BLOOD  Result Value Ref Range   Specimen  Description BLOOD [facility]    Special Requests      BOTTLES DRAWN AEROBIC AND ANAEROBIC Blood Culture adequate volume   Culture      NO GROWTH 5 DAYS Performed at [facility], [address] [place], [place] [address]    Report Status 03/10/2020 FINAL   Urine Culture   Specimen: Urine, Catheterized  Result Value Ref Range   Specimen Description      URINE, CATHETERIZED Performed at [facility], [address] [place], [place] [address]    Special Requests      NONE Performed at [facility], [address] [place], [place] [address]    Culture      NO GROWTH Performed at [facility], [address], [place], [place] [address]    Report Status 03/06/2020 FINAL   CULTURE, BLOOD (ROUTINE X 2) w Reflex to ID Panel   Specimen: BLOOD  Result Value Ref Range   Specimen Description BLOOD LEFT WRIST    Special Requests      BOTTLES DRAWN AEROBIC AND ANAEROBIC Blood Culture adequate volume   Culture      NO GROWTH 5 DAYS Performed at [facility], [address] [place], [place] [address]    Report Status 03/14/2020 FINAL   CULTURE, BLOOD (ROUTINE X 2) w Reflex to ID Panel   Specimen: BLOOD  Result Value Ref Range   Specimen Description BLOOD BLOOD RIGHT HAND    Special Requests      BOTTLES DRAWN AEROBIC AND ANAEROBIC Blood Culture adequate volume   Culture      NO GROWTH 5 DAYS Performed at [facility], [address] [place], [place] [address]    Report Status 03/14/2020 FINAL   CBC with Differential  Result Value Ref Range   WBC 28.5 (H) 4.0 - 10.5 K/uL   RBC 5.48 (H) 3.87 - 5.11 MIL/uL   Hemoglobin 17.5 (H) 12.0 - 15.0 g/dL   HCT 51.9 (H) 36.0 - 46.0 %   MCV 94.7 80.0 - 100.0 fL   MCH 31.9 26.0 - 34.0 pg   MCHC 33.7 30.0 - 36.0 g/dL   RDW 13.0 11.5 - 15.5 %   Platelets 273 150 - 400 K/uL   nRBC 0.0 0.0 - 0.2 %   Neutrophils Relative % 78 %   Neutro Abs 22.3 (H)  1.7 - 7.7 K/uL   Lymphocytes Relative 14 %    Lymphs Abs 4.1 (H) 0.7 - 4.0 K/uL   Monocytes Relative 6 %   Monocytes Absolute 1.6 (H) 0.1 - 1.0 K/uL   Eosinophils Relative 0 %   Eosinophils Absolute 0.1 0.0 - 0.5 K/uL   Basophils Relative 1 %   Basophils Absolute 0.2 (H) 0.0 - 0.1 K/uL   WBC Morphology MORPHOLOGY UNREMARKABLE    RBC Morphology MORPHOLOGY UNREMARKABLE    Smear Review Normal platelet morphology    Immature Granulocytes 1 %   Abs Immature Granulocytes 0.19 (H) 0.00 - 0.07 K/uL  Comprehensive metabolic panel  Result Value Ref Range   Sodium 145 135 - 145 mmol/L   Potassium 2.4 (LL) 3.5 - 5.1 mmol/L   Chloride 112 (H) 98 - 111 mmol/L   CO2 20 (L) 22 - 32 mmol/L   Glucose, Bld 195 (H) 70 - 99 mg/dL   BUN 14 8 - 23 mg/dL   Creatinine, Ser 1.36 (H) 0.44 - 1.00 mg/dL   Calcium 8.5 (L) 8.9 - 10.3 mg/dL   Total Protein 6.7 6.5 - 8.1 g/dL   Albumin 4.0 3.5 - 5.0 g/dL   AST 25 15 - 41 U/L   ALT 16 0 - 44 U/L   Alkaline Phosphatase 51 38 - 126 U/L   Total Bilirubin 0.5 0.3 - 1.2 mg/dL   GFR, Estimated 41 (L) >60 mL/min   Anion gap 13 5 - 15  Lipase, blood  Result Value Ref Range   Lipase 5,191 (H) 11 - 51 U/L  Urinalysis, Complete w Microscopic Urine, Catheterized  Result Value Ref Range   Color, Urine STRAW (A) YELLOW   APPearance CLEAR (A) CLEAR   Specific Gravity, Urine 1.027 1.005 - 1.030   pH 5.0 5.0 - 8.0   Glucose, UA 50 (A) NEGATIVE mg/dL   Hgb urine dipstick SMALL (A) NEGATIVE   Bilirubin Urine NEGATIVE NEGATIVE   Ketones, ur NEGATIVE NEGATIVE mg/dL   Protein, ur 30 (A) NEGATIVE mg/dL   Nitrite NEGATIVE NEGATIVE   Leukocytes,Ua NEGATIVE NEGATIVE   WBC, UA 0-5 0 - 5 WBC/hpf   Bacteria, UA NONE SEEN NONE SEEN   Squamous Epithelial / LPF 0-5 0 - 5   Mucus PRESENT    Hyaline Casts, UA PRESENT   Lactic acid, plasma  Result Value Ref Range   Lactic Acid, Venous 6.0 (HH) 0.5 - 1.9 mmol/L  Lactic acid, plasma  Result Value Ref Range   Lactic Acid, Venous 5.5 (HH) 0.5 - 1.9 mmol/L  Magnesium   Result Value Ref Range   Magnesium 2.2 1.7 - 2.4 mg/dL  Ethanol  Result Value Ref Range   Alcohol, Ethyl (B) <10 <10 mg/dL  Urine Drug Screen, Qualitative ([facility] only)  Result Value Ref Range   Tricyclic, Ur Screen NONE DETECTED NONE DETECTED   Amphetamines, Ur Screen NONE DETECTED NONE DETECTED   MDMA (Ecstasy)Ur Screen NONE DETECTED NONE DETECTED   Cocaine Metabolite,Ur [place] NONE DETECTED NONE DETECTED   Opiate, Ur Screen POSITIVE (A) NONE DETECTED   Phencyclidine (PCP) Ur S NONE DETECTED NONE DETECTED   Cannabinoid 50 Ng, Ur [place] NONE DETECTED NONE DETECTED   Barbiturates, Ur Screen NONE DETECTED NONE DETECTED   Benzodiazepine, Ur Scrn NONE DETECTED NONE DETECTED   Methadone Scn, Ur NONE DETECTED NONE DETECTED  Potassium  Result Value Ref Range   Potassium 5.1 3.5 - 5.1 mmol/L  Phosphorus  Result Value Ref Range   Phosphorus  3.6 2.5 - 4.6 mg/dL  Blood gas, arterial  Result Value Ref Range   FIO2 0.28    Delivery systems NASAL CANNULA    pH, Arterial 7.26 (L) 7.350 - 7.450   pCO2 arterial 30 (L) 32.0 - 48.0 mmHg   pO2, Arterial 72 (L) 83.0 - 108.0 mmHg   Bicarbonate 13.5 (L) 20.0 - 28.0 mmol/L   Acid-base deficit 12.0 (H) 0.0 - 2.0 mmol/L   O2 Saturation 91.5 %   Patient temperature 37.0    Collection site LEFT RADIAL    Sample type ARTERIAL DRAW    Allens test (pass/fail) POSITIVE (A) PASS  Triglycerides  Result Value Ref Range   Triglycerides 169 (H) <150 mg/dL  Glucose, capillary  Result Value Ref Range   Glucose-Capillary 233 (H) 70 - 99 mg/dL  Lactic acid, plasma  Result Value Ref Range   Lactic Acid, Venous 9.4 (HH) 0.5 - 1.9 mmol/L  C-reactive protein  Result Value Ref Range   CRP 4.6 (H) <1.0 mg/dL  Potassium  Result Value Ref Range   Potassium 5.1 3.5 - 5.1 mmol/L  Basic metabolic panel  Result Value Ref Range   Sodium 135 135 - 145 mmol/L   Potassium 5.7 (H) 3.5 - 5.1 mmol/L   Chloride 109 98 - 111 mmol/L   CO2 14 (L) 22 - 32 mmol/L   Glucose, Bld  232 (H) 70 - 99 mg/dL   BUN 16 8 - 23 mg/dL   Creatinine, Ser 1.61 (H) 0.44 - 1.00 mg/dL   Calcium 8.6 (L) 8.9 - 10.3 mg/dL   GFR, Estimated 33 (L) >60 mL/min   Anion gap 12 5 - 15  Magnesium  Result Value Ref Range   Magnesium 2.0 1.7 - 2.4 mg/dL  CBC  Result Value Ref Range   WBC 16.0 (H) 4.0 - 10.5 K/uL   RBC 5.30 (H) 3.87 - 5.11 MIL/uL   Hemoglobin 17.0 (H) 12.0 - 15.0 g/dL   HCT 50.2 (H) 36.0 - 46.0 %   MCV 94.7 80.0 - 100.0 fL   MCH 32.1 26.0 - 34.0 pg   MCHC 33.9 30.0 - 36.0 g/dL   RDW 13.1 11.5 - 15.5 %   Platelets 184 150 - 400 K/uL   nRBC 0.0 0.0 - 0.2 %  Magnesium  Result Value Ref Range   Magnesium 2.0 1.7 - 2.4 mg/dL  Phosphorus  Result Value Ref Range   Phosphorus 3.5 2.5 - 4.6 mg/dL  Lactic acid, plasma  Result Value Ref Range   Lactic Acid, Venous 3.1 (HH) 0.5 - 1.9 mmol/L  Basic metabolic panel  Result Value Ref Range   Sodium 138 135 - 145 mmol/L   Potassium 4.4 3.5 - 5.1 mmol/L   Chloride 108 98 - 111 mmol/L   CO2 18 (L) 22 - 32 mmol/L   Glucose, Bld 213 (H) 70 - 99 mg/dL   BUN 18 8 - 23 mg/dL   Creatinine, Ser 1.30 (H) 0.44 - 1.00 mg/dL   Calcium 8.1 (L) 8.9 - 10.3 mg/dL   GFR, Estimated 43 (L) >60 mL/min   Anion gap 12 5 - 15  Comprehensive metabolic panel  Result Value Ref Range   Sodium 139 135 - 145 mmol/L   Potassium 4.5 3.5 - 5.1 mmol/L   Chloride 109 98 - 111 mmol/L   CO2 21 (L) 22 - 32 mmol/L   Glucose, Bld 218 (H) 70 - 99 mg/dL   BUN 20 8 - 23 mg/dL   Creatinine,  Ser 1.31 (H) 0.44 - 1.00 mg/dL   Calcium 8.2 (L) 8.9 - 10.3 mg/dL   Total Protein 5.3 (L) 6.5 - 8.1 g/dL   Albumin 2.9 (L) 3.5 - 5.0 g/dL   AST 44 (H) 15 - 41 U/L   ALT 14 0 - 44 U/L   Alkaline Phosphatase 39 38 - 126 U/L   Total Bilirubin 1.5 (H) 0.3 - 1.2 mg/dL   GFR, Estimated 43 (L) >60 mL/min   Anion gap 9 5 - 15  CBC with Differential/Platelet  Result Value Ref Range   WBC 16.5 (H) 4.0 - 10.5 K/uL   RBC 4.80 3.87 - 5.11 MIL/uL   Hemoglobin 15.0 12.0 - 15.0  g/dL   HCT 45.1 36.0 - 46.0 %   MCV 94.0 80.0 - 100.0 fL   MCH 31.3 26.0 - 34.0 pg   MCHC 33.3 30.0 - 36.0 g/dL   RDW 13.2 11.5 - 15.5 %   Platelets 141 (L) 150 - 400 K/uL   nRBC 0.0 0.0 - 0.2 %   Neutrophils Relative % 81 %   Neutro Abs 13.3 (H) 1.7 - 7.7 K/uL   Lymphocytes Relative 10 %   Lymphs Abs 1.6 0.7 - 4.0 K/uL   Monocytes Relative 9 %   Monocytes Absolute 1.5 (H) 0.1 - 1.0 K/uL   Eosinophils Relative 0 %   Eosinophils Absolute 0.0 0.0 - 0.5 K/uL   Basophils Relative 0 %   Basophils Absolute 0.0 0.0 - 0.1 K/uL   Immature Granulocytes 0 %   Abs Immature Granulocytes 0.06 0.00 - 0.07 K/uL  Procalcitonin - Baseline  Result Value Ref Range   Procalcitonin 1.60 ng/mL  Procalcitonin  Result Value Ref Range   Procalcitonin 1.55 ng/mL  Lactic acid, plasma  Result Value Ref Range   Lactic Acid, Venous 1.9 0.5 - 1.9 mmol/L  Procalcitonin  Result Value Ref Range   Procalcitonin 2.03 ng/mL  Basic metabolic panel  Result Value Ref Range   Sodium 141 135 - 145 mmol/L   Potassium 4.1 3.5 - 5.1 mmol/L   Chloride 111 98 - 111 mmol/L   CO2 23 22 - 32 mmol/L   Glucose, Bld 221 (H) 70 - 99 mg/dL   BUN 24 (H) 8 - 23 mg/dL   Creatinine, Ser 1.21 (H) 0.44 - 1.00 mg/dL   Calcium 8.3 (L) 8.9 - 10.3 mg/dL   GFR, Estimated 47 (L) >60 mL/min   Anion gap 7 5 - 15  Magnesium  Result Value Ref Range   Magnesium 2.0 1.7 - 2.4 mg/dL  Phosphorus  Result Value Ref Range   Phosphorus 2.4 (L) 2.5 - 4.6 mg/dL  CBC  Result Value Ref Range   WBC 14.7 (H) 4.0 - 10.5 K/uL   RBC 4.33 3.87 - 5.11 MIL/uL   Hemoglobin 13.5 12.0 - 15.0 g/dL   HCT 41.3 36.0 - 46.0 %   MCV 95.4 80.0 - 100.0 fL   MCH 31.2 26.0 - 34.0 pg   MCHC 32.7 30.0 - 36.0 g/dL   RDW 13.2 11.5 - 15.5 %   Platelets 124 (L) 150 - 400 K/uL   nRBC 0.0 0.0 - 0.2 %  Glucose, capillary  Result Value Ref Range   Glucose-Capillary 242 (H) 70 - 99 mg/dL  Glucose, capillary  Result Value Ref Range   Glucose-Capillary 235 (H) 70 -  99 mg/dL  CBC with Differential/Platelet  Result Value Ref Range   WBC 10.5 4.0 - 10.5 K/uL   RBC  3.78 (L) 3.87 - 5.11 MIL/uL   Hemoglobin 11.9 (L) 12.0 - 15.0 g/dL   HCT 36.6 36.0 - 46.0 %   MCV 96.8 80.0 - 100.0 fL   MCH 31.5 26.0 - 34.0 pg   MCHC 32.5 30.0 - 36.0 g/dL   RDW 13.2 11.5 - 15.5 %   Platelets 103 (L) 150 - 400 K/uL   nRBC 0.0 0.0 - 0.2 %   Neutrophils Relative % 79 %   Neutro Abs 8.3 (H) 1.7 - 7.7 K/uL   Lymphocytes Relative 10 %   Lymphs Abs 1.0 0.7 - 4.0 K/uL   Monocytes Relative 10 %   Monocytes Absolute 1.1 (H) 0.1 - 1.0 K/uL   Eosinophils Relative 0 %   Eosinophils Absolute 0.0 0.0 - 0.5 K/uL   Basophils Relative 0 %   Basophils Absolute 0.0 0.0 - 0.1 K/uL   RBC Morphology MORPHOLOGY UNREMARKABLE    Smear Review Normal platelet morphology    Immature Granulocytes 1 %   Abs Immature Granulocytes 0.11 (H) 0.00 - 0.07 K/uL  Comprehensive metabolic panel  Result Value Ref Range   Sodium 141 135 - 145 mmol/L   Potassium 3.4 (L) 3.5 - 5.1 mmol/L   Chloride 112 (H) 98 - 111 mmol/L   CO2 23 22 - 32 mmol/L   Glucose, Bld 267 (H) 70 - 99 mg/dL   BUN 24 (H) 8 - 23 mg/dL   Creatinine, Ser 1.09 (H) 0.44 - 1.00 mg/dL   Calcium 8.0 (L) 8.9 - 10.3 mg/dL   Total Protein 5.3 (L) 6.5 - 8.1 g/dL   Albumin 2.5 (L) 3.5 - 5.0 g/dL   AST 42 (H) 15 - 41 U/L   ALT 17 0 - 44 U/L   Alkaline Phosphatase 37 (L) 38 - 126 U/L   Total Bilirubin 2.0 (H) 0.3 - 1.2 mg/dL   GFR, Estimated 53 (L) >60 mL/min   Anion gap 6 5 - 15  Procalcitonin  Result Value Ref Range   Procalcitonin 1.78 ng/mL  Glucose, capillary  Result Value Ref Range   Glucose-Capillary 222 (H) 70 - 99 mg/dL  Magnesium  Result Value Ref Range   Magnesium 1.9 1.7 - 2.4 mg/dL  Phosphorus  Result Value Ref Range   Phosphorus 1.5 (L) 2.5 - 4.6 mg/dL  phosphorus level  Result Value Ref Range   Phosphorus 2.8 2.5 - 4.6 mg/dL  Glucose, capillary  Result Value Ref Range   Glucose-Capillary 328 (H) 70 - 99 mg/dL   Hemoglobin A1c  Result Value Ref Range   Hgb A1c MFr Bld 5.8 (H) 4.8 - 5.6 %   Mean Plasma Glucose 119.76 mg/dL  Glucose, capillary  Result Value Ref Range   Glucose-Capillary 292 (H) 70 - 99 mg/dL  Glucose, capillary  Result Value Ref Range   Glucose-Capillary 291 (H) 70 - 99 mg/dL  CBC with Differential/Platelet  Result Value Ref Range   WBC 12.8 (H) 4.0 - 10.5 K/uL   RBC 3.65 (L) 3.87 - 5.11 MIL/uL   Hemoglobin 11.6 (L) 12.0 - 15.0 g/dL   HCT 35.1 (L) 36.0 - 46.0 %   MCV 96.2 80.0 - 100.0 fL   MCH 31.8 26.0 - 34.0 pg   MCHC 33.0 30.0 - 36.0 g/dL   RDW 13.2 11.5 - 15.5 %   Platelets 131 (L) 150 - 400 K/uL   nRBC 0.0 0.0 - 0.2 %   Neutrophils Relative % 69 %   Neutro Abs 9.0 (H) 1.7 - 7.7  K/uL   Lymphocytes Relative 13 %   Lymphs Abs 1.6 0.7 - 4.0 K/uL   Monocytes Relative 14 %   Monocytes Absolute 1.8 (H) 0.1 - 1.0 K/uL   Eosinophils Relative 1 %   Eosinophils Absolute 0.1 0.0 - 0.5 K/uL   Basophils Relative 1 %   Basophils Absolute 0.1 0.0 - 0.1 K/uL   WBC Morphology MORPHOLOGY UNREMARKABLE    RBC Morphology MORPHOLOGY UNREMARKABLE    Smear Review Normal platelet morphology    Immature Granulocytes 2 %   Abs Immature Granulocytes 0.27 (H) 0.00 - 0.07 K/uL  Comprehensive metabolic panel  Result Value Ref Range   Sodium 138 135 - 145 mmol/L   Potassium 3.4 (L) 3.5 - 5.1 mmol/L   Chloride 107 98 - 111 mmol/L   CO2 25 22 - 32 mmol/L   Glucose, Bld 126 (H) 70 - 99 mg/dL   BUN 18 8 - 23 mg/dL   Creatinine, Ser 1.16 (H) 0.44 - 1.00 mg/dL   Calcium 8.1 (L) 8.9 - 10.3 mg/dL   Total Protein 5.7 (L) 6.5 - 8.1 g/dL   Albumin 2.5 (L) 3.5 - 5.0 g/dL   AST 26 15 - 41 U/L   ALT 16 0 - 44 U/L   Alkaline Phosphatase 50 38 - 126 U/L   Total Bilirubin 1.5 (H) 0.3 - 1.2 mg/dL   GFR, Estimated 49 (L) >60 mL/min   Anion gap 6 5 - 15  Procalcitonin  Result Value Ref Range   Procalcitonin 1.32 ng/mL  Phosphorus  Result Value Ref Range   Phosphorus 2.4 (L) 2.5 - 4.6 mg/dL   Magnesium  Result Value Ref Range   Magnesium 2.0 1.7 - 2.4 mg/dL  Glucose, capillary  Result Value Ref Range   Glucose-Capillary 142 (H) 70 - 99 mg/dL  Glucose, capillary  Result Value Ref Range   Glucose-Capillary 199 (H) 70 - 99 mg/dL  Glucose, capillary  Result Value Ref Range   Glucose-Capillary 150 (H) 70 - 99 mg/dL  CBC with Differential/Platelet  Result Value Ref Range   WBC 10.9 (H) 4.0 - 10.5 K/uL   RBC 3.27 (L) 3.87 - 5.11 MIL/uL   Hemoglobin 10.2 (L) 12.0 - 15.0 g/dL   HCT 31.9 (L) 36.0 - 46.0 %   MCV 97.6 80.0 - 100.0 fL   MCH 31.2 26.0 - 34.0 pg   MCHC 32.0 30.0 - 36.0 g/dL   RDW 13.0 11.5 - 15.5 %   Platelets 148 (L) 150 - 400 K/uL   nRBC 0.3 (H) 0.0 - 0.2 %   Neutrophils Relative % 64 %   Neutro Abs 7.1 1.7 - 7.7 K/uL   Lymphocytes Relative 14 %   Lymphs Abs 1.5 0.7 - 4.0 K/uL   Monocytes Relative 16 %   Monocytes Absolute 1.8 (H) 0.1 - 1.0 K/uL   Eosinophils Relative 1 %   Eosinophils Absolute 0.1 0.0 - 0.5 K/uL   Basophils Relative 1 %   Basophils Absolute 0.1 0.0 - 0.1 K/uL   WBC Morphology MILD LEFT SHIFT (1-5% METAS, OCC MYELO, OCC BANDS)    Smear Review Normal platelet morphology    Immature Granulocytes 4 %   Abs Immature Granulocytes 0.46 (H) 0.00 - 0.07 K/uL   Polychromasia PRESENT   Comprehensive metabolic panel  Result Value Ref Range   Sodium 135 135 - 145 mmol/L   Potassium 3.6 3.5 - 5.1 mmol/L   Chloride 104 98 - 111 mmol/L   CO2 24 22 -  32 mmol/L   Glucose, Bld 139 (H) 70 - 99 mg/dL   BUN 15 8 - 23 mg/dL   Creatinine, Ser 1.15 (H) 0.44 - 1.00 mg/dL   Calcium 7.9 (L) 8.9 - 10.3 mg/dL   Total Protein 5.0 (L) 6.5 - 8.1 g/dL   Albumin 2.2 (L) 3.5 - 5.0 g/dL   AST 23 15 - 41 U/L   ALT 15 0 - 44 U/L   Alkaline Phosphatase 68 38 - 126 U/L   Total Bilirubin 1.5 (H) 0.3 - 1.2 mg/dL   GFR, Estimated 50 (L) >60 mL/min   Anion gap 7 5 - 15  Phosphorus  Result Value Ref Range   Phosphorus 2.4 (L) 2.5 - 4.6 mg/dL  Magnesium  Result Value  Ref Range   Magnesium 2.1 1.7 - 2.4 mg/dL  Glucose, capillary  Result Value Ref Range   Glucose-Capillary 121 (H) 70 - 99 mg/dL  Glucose, capillary  Result Value Ref Range   Glucose-Capillary 114 (H) 70 - 99 mg/dL  Glucose, capillary  Result Value Ref Range   Glucose-Capillary 129 (H) 70 - 99 mg/dL  Urinalysis, Complete w Microscopic Urine, Clean Catch  Result Value Ref Range   Color, Urine YELLOW (A) YELLOW   APPearance CLEAR (A) CLEAR   Specific Gravity, Urine 1.018 1.005 - 1.030   pH 6.0 5.0 - 8.0   Glucose, UA NEGATIVE NEGATIVE mg/dL   Hgb urine dipstick SMALL (A) NEGATIVE   Bilirubin Urine NEGATIVE NEGATIVE   Ketones, ur NEGATIVE NEGATIVE mg/dL   Protein, ur 100 (A) NEGATIVE mg/dL   Nitrite NEGATIVE NEGATIVE   Leukocytes,Ua NEGATIVE NEGATIVE   RBC / HPF 0-5 0 - 5 RBC/hpf   WBC, UA 0-5 0 - 5 WBC/hpf   Bacteria, UA NONE SEEN NONE SEEN   Squamous Epithelial / LPF 0-5 0 - 5  Glucose, capillary  Result Value Ref Range   Glucose-Capillary 150 (H) 70 - 99 mg/dL  Phosphorus  Result Value Ref Range   Phosphorus 2.0 (L) 2.5 - 4.6 mg/dL  Magnesium  Result Value Ref Range   Magnesium 2.3 1.7 - 2.4 mg/dL  Glucose, capillary  Result Value Ref Range   Glucose-Capillary 170 (H) 70 - 99 mg/dL  Glucose, capillary  Result Value Ref Range   Glucose-Capillary 78 70 - 99 mg/dL  Glucose, capillary  Result Value Ref Range   Glucose-Capillary 98 70 - 99 mg/dL  Glucose, capillary  Result Value Ref Range   Glucose-Capillary 195 (H) 70 - 99 mg/dL  Glucose, capillary  Result Value Ref Range   Glucose-Capillary 119 (H) 70 - 99 mg/dL   Comment 1 Notify RN   Basic metabolic panel  Result Value Ref Range   Sodium 138 135 - 145 mmol/L   Potassium 2.8 (L) 3.5 - 5.1 mmol/L   Chloride 104 98 - 111 mmol/L   CO2 27 22 - 32 mmol/L   Glucose, Bld 170 (H) 70 - 99 mg/dL   BUN 9 8 - 23 mg/dL   Creatinine, Ser 1.06 (H) 0.44 - 1.00 mg/dL   Calcium 7.9 (L) 8.9 - 10.3 mg/dL   GFR, Estimated 55  (L) >60 mL/min   Anion gap 7 5 - 15  Phosphorus  Result Value Ref Range   Phosphorus 2.1 (L) 2.5 - 4.6 mg/dL  Brain natriuretic peptide  Result Value Ref Range   B Natriuretic Peptide 139.0 (H) 0.0 - 100.0 pg/mL  CBC  Result Value Ref Range   WBC 14.8 (H) 4.0 -  10.5 K/uL   RBC 3.20 (L) 3.87 - 5.11 MIL/uL   Hemoglobin 10.0 (L) 12.0 - 15.0 g/dL   HCT 30.7 (L) 36.0 - 46.0 %   MCV 95.9 80.0 - 100.0 fL   MCH 31.3 26.0 - 34.0 pg   MCHC 32.6 30.0 - 36.0 g/dL   RDW 12.7 11.5 - 15.5 %   Platelets 254 150 - 400 K/uL   nRBC 0.1 0.0 - 0.2 %  Glucose, capillary  Result Value Ref Range   Glucose-Capillary 94 70 - 99 mg/dL  Glucose, capillary  Result Value Ref Range   Glucose-Capillary 214 (H) 70 - 99 mg/dL  Glucose, capillary  Result Value Ref Range   Glucose-Capillary 204 (H) 70 - 99 mg/dL  Glucose, capillary  Result Value Ref Range   Glucose-Capillary 63 (L) 70 - 99 mg/dL  Glucose, capillary  Result Value Ref Range   Glucose-Capillary 86 70 - 99 mg/dL  CBC  Result Value Ref Range   WBC 19.2 (H) 4.0 - 10.5 K/uL   RBC 3.24 (L) 3.87 - 5.11 MIL/uL   Hemoglobin 10.2 (L) 12.0 - 15.0 g/dL   HCT 30.4 (L) 36.0 - 46.0 %   MCV 93.8 80.0 - 100.0 fL   MCH 31.5 26.0 - 34.0 pg   MCHC 33.6 30.0 - 36.0 g/dL   RDW 13.0 11.5 - 15.5 %   Platelets 300 150 - 400 K/uL   nRBC 0.1 0.0 - 0.2 %  Basic metabolic panel  Result Value Ref Range   Sodium 138 135 - 145 mmol/L   Potassium 3.6 3.5 - 5.1 mmol/L   Chloride 104 98 - 111 mmol/L   CO2 26 22 - 32 mmol/L   Glucose, Bld 139 (H) 70 - 99 mg/dL   BUN 7 (L) 8 - 23 mg/dL   Creatinine, Ser 0.93 0.44 - 1.00 mg/dL   Calcium 8.3 (L) 8.9 - 10.3 mg/dL   GFR, Estimated >60 >60 mL/min   Anion gap 8 5 - 15  Glucose, capillary  Result Value Ref Range   Glucose-Capillary 109 (H) 70 - 99 mg/dL  Glucose, capillary  Result Value Ref Range   Glucose-Capillary 152 (H) 70 - 99 mg/dL   Comment 1 Notify RN    Comment 2 Document in Chart   Glucose, capillary   Result Value Ref Range   Glucose-Capillary 159 (H) 70 - 99 mg/dL  Renal function panel  Result Value Ref Range   Sodium 136 135 - 145 mmol/L   Potassium 3.4 (L) 3.5 - 5.1 mmol/L   Chloride 101 98 - 111 mmol/L   CO2 25 22 - 32 mmol/L   Glucose, Bld 140 (H) 70 - 99 mg/dL   BUN 8 8 - 23 mg/dL   Creatinine, Ser 1.06 (H) 0.44 - 1.00 mg/dL   Calcium 8.3 (L) 8.9 - 10.3 mg/dL   Phosphorus 3.2 2.5 - 4.6 mg/dL   Albumin 2.5 (L) 3.5 - 5.0 g/dL   GFR, Estimated 55 (L) >60 mL/min   Anion gap 10 5 - 15  CBC  Result Value Ref Range   WBC 23.8 (H) 4.0 - 10.5 K/uL   RBC 3.38 (L) 3.87 - 5.11 MIL/uL   Hemoglobin 10.6 (L) 12.0 - 15.0 g/dL   HCT 31.6 (L) 36.0 - 46.0 %   MCV 93.5 80.0 - 100.0 fL   MCH 31.4 26.0 - 34.0 pg   MCHC 33.5 30.0 - 36.0 g/dL   RDW 13.1 11.5 - 15.5 %  Platelets 439 (H) 150 - 400 K/uL   nRBC 0.0 0.0 - 0.2 %  Glucose, capillary  Result Value Ref Range   Glucose-Capillary 86 70 - 99 mg/dL   Comment 1 Notify RN    Comment 2 Document in Chart   Glucose, capillary  Result Value Ref Range   Glucose-Capillary 121 (H) 70 - 99 mg/dL  Glucose, capillary  Result Value Ref Range   Glucose-Capillary 174 (H) 70 - 99 mg/dL   Comment 1 Notify RN   Glucose, capillary  Result Value Ref Range   Glucose-Capillary 131 (H) 70 - 99 mg/dL   Comment 1 Notify RN   Hepatic function panel  Result Value Ref Range   Total Protein 6.2 (L) 6.5 - 8.1 g/dL   Albumin 2.3 (L) 3.5 - 5.0 g/dL   AST 26 15 - 41 U/L   ALT 28 0 - 44 U/L   Alkaline Phosphatase 130 (H) 38 - 126 U/L   Total Bilirubin 1.0 0.3 - 1.2 mg/dL   Bilirubin, Direct 0.2 0.0 - 0.2 mg/dL   Indirect Bilirubin 0.8 0.3 - 0.9 mg/dL  Potassium  Result Value Ref Range   Potassium 3.6 3.5 - 5.1 mmol/L  CBC  Result Value Ref Range   WBC 17.9 (H) 4.0 - 10.5 K/uL   RBC 3.36 (L) 3.87 - 5.11 MIL/uL   Hemoglobin 10.5 (L) 12.0 - 15.0 g/dL   HCT 31.6 (L) 36.0 - 46.0 %   MCV 94.0 80.0 - 100.0 fL   MCH 31.3 26.0 - 34.0 pg   MCHC 33.2  30.0 - 36.0 g/dL   RDW 13.2 11.5 - 15.5 %   Platelets 506 (H) 150 - 400 K/uL   nRBC 0.0 0.0 - 0.2 %  Glucose, capillary  Result Value Ref Range   Glucose-Capillary 168 (H) 70 - 99 mg/dL   Comment 1 Notify RN   Glucose, capillary  Result Value Ref Range   Glucose-Capillary 122 (H) 70 - 99 mg/dL  Glucose, capillary  Result Value Ref Range   Glucose-Capillary 170 (H) 70 - 99 mg/dL   Comment 1 Notify RN   Glucose, capillary  Result Value Ref Range   Glucose-Capillary 100 (H) 70 - 99 mg/dL   Comment 1 Notify RN   Glucose, capillary  Result Value Ref Range   Glucose-Capillary 193 (H) 70 - 99 mg/dL   Comment 1 Notify RN   Basic metabolic panel  Result Value Ref Range   Sodium 136 135 - 145 mmol/L   Potassium 3.6 3.5 - 5.1 mmol/L   Chloride 104 98 - 111 mmol/L   CO2 26 22 - 32 mmol/L   Glucose, Bld 208 (H) 70 - 99 mg/dL   BUN 7 (L) 8 - 23 mg/dL   Creatinine, Ser 1.04 (H) 0.44 - 1.00 mg/dL   Calcium 8.2 (L) 8.9 - 10.3 mg/dL   GFR, Estimated 56 (L) >60 mL/min   Anion gap 6 5 - 15  CBC  Result Value Ref Range   WBC 14.0 (H) 4.0 - 10.5 K/uL   RBC 3.27 (L) 3.87 - 5.11 MIL/uL   Hemoglobin 10.1 (L) 12.0 - 15.0 g/dL   HCT 31.0 (L) 36.0 - 46.0 %   MCV 94.8 80.0 - 100.0 fL   MCH 30.9 26.0 - 34.0 pg   MCHC 32.6 30.0 - 36.0 g/dL   RDW 13.2 11.5 - 15.5 %   Platelets 524 (H) 150 - 400 K/uL   nRBC 0.0 0.0 - 0.2 %  Glucose, capillary  Result Value Ref Range   Glucose-Capillary 244 (H) 70 - 99 mg/dL  Glucose, capillary  Result Value Ref Range   Glucose-Capillary 200 (H) 70 - 99 mg/dL   Comment 1 Notify RN   Glucose, capillary  Result Value Ref Range   Glucose-Capillary 226 (H) 70 - 99 mg/dL   Comment 1 Notify RN   Glucose, capillary  Result Value Ref Range   Glucose-Capillary 72 70 - 99 mg/dL  CBC  Result Value Ref Range   WBC 11.4 (H) 4.0 - 10.5 K/uL   RBC 3.30 (L) 3.87 - 5.11 MIL/uL   Hemoglobin 10.4 (L) 12.0 - 15.0 g/dL   HCT 31.2 (L) 36.0 - 46.0 %   MCV 94.5 80.0 - 100.0  fL   MCH 31.5 26.0 - 34.0 pg   MCHC 33.3 30.0 - 36.0 g/dL   RDW 13.2 11.5 - 15.5 %   Platelets 581 (H) 150 - 400 K/uL   nRBC 0.0 0.0 - 0.2 %  Basic metabolic panel  Result Value Ref Range   Sodium 138 135 - 145 mmol/L   Potassium 3.9 3.5 - 5.1 mmol/L   Chloride 107 98 - 111 mmol/L   CO2 25 22 - 32 mmol/L   Glucose, Bld 118 (H) 70 - 99 mg/dL   BUN 6 (L) 8 - 23 mg/dL   Creatinine, Ser 0.96 0.44 - 1.00 mg/dL   Calcium 8.4 (L) 8.9 - 10.3 mg/dL   GFR, Estimated >60 >60 mL/min   Anion gap 6 5 - 15  Glucose, capillary  Result Value Ref Range   Glucose-Capillary 144 (H) 70 - 99 mg/dL  Glucose, capillary  Result Value Ref Range   Glucose-Capillary 138 (H) 70 - 99 mg/dL  Glucose, capillary  Result Value Ref Range   Glucose-Capillary 150 (H) 70 - 99 mg/dL  Glucose, capillary  Result Value Ref Range   Glucose-Capillary 102 (H) 70 - 99 mg/dL  Glucose, capillary  Result Value Ref Range   Glucose-Capillary 132 (H) 70 - 99 mg/dL  Glucose, capillary  Result Value Ref Range   Glucose-Capillary 150 (H) 70 - 99 mg/dL  CBC  Result Value Ref Range   WBC 10.5 4.0 - 10.5 K/uL   RBC 3.20 (L) 3.87 - 5.11 MIL/uL   Hemoglobin 9.8 (L) 12.0 - 15.0 g/dL   HCT 30.2 (L) 36.0 - 46.0 %   MCV 94.4 80.0 - 100.0 fL   MCH 30.6 26.0 - 34.0 pg   MCHC 32.5 30.0 - 36.0 g/dL   RDW 13.2 11.5 - 15.5 %   Platelets 577 (H) 150 - 400 K/uL   nRBC 0.0 0.0 - 0.2 %  Glucose, capillary  Result Value Ref Range   Glucose-Capillary 97 70 - 99 mg/dL  Glucose, capillary  Result Value Ref Range   Glucose-Capillary 193 (H) 70 - 99 mg/dL  Glucose, capillary  Result Value Ref Range   Glucose-Capillary 100 (H) 70 - 99 mg/dL  Glucose, capillary  Result Value Ref Range   Glucose-Capillary 112 (H) 70 - 99 mg/dL  CBG monitoring, ED  Result Value Ref Range   Glucose-Capillary 174 (H) 70 - 99 mg/dL  ECHOCARDIOGRAM COMPLETE  Result Value Ref Range   Weight 2,500.9 oz   Height 66 in   BP 143/68 mmHg   Ao pk vel 1.64 m/s    AV Area VTI 2.07 cm2   AR max vel 1.99 cm2   AV Mean grad 6.0 mmHg   AV Peak  grad 10.8 mmHg   S' Lateral 1.80 cm   AV Area mean vel 2.02 cm2   Area-P 1/2 12.64 cm2   MV VTI 2.39 cm2  Troponin I (High Sensitivity)  Result Value Ref Range   Troponin I (High Sensitivity) 6 <18 ng/L  Troponin I (High Sensitivity)  Result Value Ref Range   Troponin I (High Sensitivity) 8 <18 ng/L      Assessment & Plan:   Problem List Items Addressed This Visit    Pulmonary nodules   Essential hypertension   Relevant Medications   lisinopril (ZESTRIL) 10 MG tablet   rosuvastatin (CRESTOR) 10 MG tablet   Elevated serum creatinine   Abdominal aortic aneurysm (AAA) without rupture (HCC)   Relevant Medications   lisinopril (ZESTRIL) 10 MG tablet   rosuvastatin (CRESTOR) 10 MG tablet    Other Visit Diagnoses    Acute necrotizing pancreatitis    -  Primary   Hyperglycemia         Acute necrotizing pancreatitis With complications, hyperglycemia secondary, without dx DM/PreDM Remains on liquid diet currently, working on getting pancreatic enzymes for advancing diet, advised on discharge paperwork for low carb low sugar low fat diet - and gradually increase  Upcoming [facility] follow-up Dr Marius Ditch on 3/28  Patient was scheduled earlier for HFU here. Too soon for repeat labs. Will defer to GI at this time, no acute need on initial discharge.  AKI - resolved on repeat labs from hospital HTN - had low BP with acute illness in hospital, now back to baseline  Discussed dietary recommendations today, encouraged adhering to liquid diet as tolerated for now seems improved nutrition and tolerating well, caution with advancing diet. Use protein supplement glucerna  Caution with hyperglycemia, I am okay with CBG 150-200 range at this time, expect to improve once pancreas heals. But would defer starting insulin at this time, encouraged to manage it with lifestyle. Caution of hypoglycemia is bigger concern, therefore  holding any therapy and will monitor CBG closely at home.  Regarding HTN - will resume Lisinopril 88m daily now no longer AKI. She can resume HALF dose of Atenolol from 580mnow at half tab for 2547maily  May hold Simvastatin / COQ10 for short term until improved diet can add back on.  [facility] - no new referral to be placed today - if still not setup asked to contact us [place] and we can place new referral.  Will forward chart to Dr VanMarius Ditchgarding pancreatic enzyme still too costly, need new option before 3/28 , theyre asking about labs as well to be done prior to her apt 3/28 if needed or can discuss at that time.  Regarding incidental findings pulmonary nodule asked to repeat CT in 1 year. Aortic Aneurysm in Abdomen AAA 31 cm mild, radiology recommend repeat imaging in 3 years.   No orders of the defined types were placed in this encounter.   Follow up plan: Return in about 2 months (around 05/17/2020) for 2 month with new provider.  AleNobie PutnamO [facility] 03/17/2020, 2:58 PM

## 2020-03-18 ENCOUNTER — Telehealth: Payer: Self-pay

## 2020-03-18 NOTE — Telephone Encounter (Signed)
Thank you  RV 

## 2020-03-18 NOTE — Telephone Encounter (Signed)
-----   Message from [name], MD sent at 03/18/2020  9:05 AM EDT ----- Thanks for reaching out, she said creon is quite expensive, we are trying zenpep, I don't know if that is also expensive. Morrie Sheldon, can you call her We can try samples if she can't afford  Thanks RV  ----- Message ----- From: Smitty Cords, DO Sent: 03/17/2020   5:56 PM EDT To: [name], MD, Denman George, CMA  Hello Dr Allegra Lai and Morrie Sheldon, just wanted to coordinate with you for this mutual patient. I saw her today at [facility] since it was moved up sooner by patient request. Overall I think she is doing very well based on my review of her hospitalization. Tolerating liquid nutrition well. We are restarting some medications, and counseling on sugar. Holding off on any treatment of hyperglycemia, no insulin at this time, seems to be mild overall and hopeful it would heal with pancreas over time. My main reason for reaching out is that they still have problem with very high cost of pancreatic enzymes. The new rx I am told is still extremely expensive and they cannot afford. They asked about other pancreatic enzyme medications they find with online searches/OTC non rx, I am not quite sure which specifics - but I said that I would reach back out to you to relay their message. If possible, can you follow-up with their concern on the cost still since will run out by end of week. Apt with you is 3/28, also asked about labs if need.

## 2020-03-18 NOTE — Telephone Encounter (Signed)
Patient son states the Zenpep was 4100 dollars. They are coming to pick up samples of the Creon today. Wants to know if there is any other options she can take. He states buying something otc or getting it from [place]. He states he is feeling out patient assistance for the creon to see if they can get help with the medication. He will either email it to be or fax it. Gave them 6 boxes of samples

## 2020-03-19 ENCOUNTER — Telehealth: Payer: Self-pay

## 2020-03-19 ENCOUNTER — Other Ambulatory Visit: Payer: Self-pay

## 2020-03-19 NOTE — Telephone Encounter (Signed)
Filled out our portion of [employer] for Creon medication. Attached medication list and insurance card. Informed patient son that the forms are read to pick up so he can fill out her portion and send it in. He states he will pick it up today

## 2020-03-20 ENCOUNTER — Telehealth: Payer: Self-pay | Admitting: Family Medicine

## 2020-03-20 NOTE — Telephone Encounter (Signed)
Okay. Similar to our discussion in the office. The blood sugar will take some time to respond once pancreas heals. It is still too early for this to be back to normal. Make sure checking 2 hours after eating, not sooner, and fasting in morning check is fine.  I would give it another week at least. She should see the GI specialist first and keep log of sugars. And if still not improving after another week, we can offer a low dose insulin treatment option for the blood sugar.  Again, as discussed in office - high sugar 200-300 is not a serious problem right now as long as it is short term. Hopefully will resolved within a few weeks.  If she does take the insulin, low sugar can cause serious problem or consequences, so we only do that if it is absolutely not going down.  Saralyn Pilar, DO [facility] 03/20/2020, 3:19 PM

## 2020-03-20 NOTE — Telephone Encounter (Signed)
I called the patient to clarify why she needed the hydrocodone-APAP 5-325MG . She informed me that its for intermittent pain in her upper & lower abdomen. She was prescribed this medication when she left the hospital, but was only given #10. She have not tried anything else to help with the intermittent pain.

## 2020-03-20 NOTE — Telephone Encounter (Signed)
The pt was notified of the recommendation to take the Tylenol. She verbalize understanding. She said the stomach pain has improved. She want to know your thoughts on her blood sugar readings. Her blood sugar reading after she ate baked chicken, rice and broccoli and her sugar level was 323 before bedtime on 03/19/2020. On 03/19/2020 in the morning it read 246.

## 2020-03-20 NOTE — Telephone Encounter (Signed)
I would not recommend continuing the Hydrocodone pain medication.  Pain medication can cause issues with bowel dysfunction and many other side effects.  Usually hospital discharges patients on short courses only but we often do not continue and re order these medications unless absolutely necessary.  If it is intermittent pain, I would recommend Tylenol Ext Str 500-1000mg  per dose 3 times a day.  Saralyn Pilar, DO [facility] 03/20/2020, 12:11 PM

## 2020-03-20 NOTE — Telephone Encounter (Signed)
Patient wanted to inform PCP of the following blood sugar reading after she ate baked chicken, rice and broccoli and her sugar level was 323 before bedtime on 03/19/2020. On 03/19/2020 in the morning it read 246. Patient states she feels fine. Also, patient requesting hydrocodone acetaminophen 5-325 1 to 2 every 6hrs  [facility] [address]), [place] - [address] Phone:  [phone]  Fax:  [phone]

## 2020-03-20 NOTE — Telephone Encounter (Signed)
The pt was notified of Dr. Kirtland Bouchard recommendation. She verbalize understanding.

## 2020-03-24 ENCOUNTER — Inpatient Hospital Stay: Payer: Medicare Other | Admitting: Infectious Diseases

## 2020-03-30 ENCOUNTER — Telehealth: Payer: Self-pay

## 2020-03-30 ENCOUNTER — Encounter: Payer: Self-pay | Admitting: Gastroenterology

## 2020-03-30 ENCOUNTER — Ambulatory Visit (INDEPENDENT_AMBULATORY_CARE_PROVIDER_SITE_OTHER): Payer: Medicare Other | Admitting: Gastroenterology

## 2020-03-30 ENCOUNTER — Other Ambulatory Visit: Payer: Self-pay

## 2020-03-30 VITALS — BP 135/79 | HR 64 | Temp 98.1°F | Ht 66.0 in | Wt 138.0 lb

## 2020-03-30 DIAGNOSIS — K8591 Acute pancreatitis with uninfected necrosis, unspecified: Secondary | ICD-10-CM | POA: Diagnosis not present

## 2020-03-30 DIAGNOSIS — E162 Hypoglycemia, unspecified: Secondary | ICD-10-CM

## 2020-03-30 NOTE — Telephone Encounter (Signed)
Patient got approved for creon patient assistance

## 2020-03-30 NOTE — Patient Instructions (Addendum)
MRI is scheduled for 04/14/2020 [facility] 2:30[facility]. Nothing to eat or drink 4 hours before the scan

## 2020-03-30 NOTE — Progress Notes (Signed)
Cephas Darby, MD [address]  [place]  [place], [place] [address]  Main: [phone]  Fax: [phone]    Gastroenterology Consultation  Referring Provider:     Verl Bangs, FNP Primary Care Physician:  Verl Bangs, FNP Primary Gastroenterologist:  Dr. Cephas Darby Reason for Consultation:     Hospital follow-up, acute necrotizing pancreatitis        HPI:   Holly Navarro is a 75 y.o. female referred by Dr. Lorine Bears, Lupita Raider, FNP  for consultation & management of recent hospitalization for acute necrotizing pancreatitis of unclear etiology.  She does have history of chronic tobacco use.  Patient was hospitalized from 02/29/2020 through 03/14/2020, diagnosed with acute necrotizing pancreatitis without evidence of infection.  She did not have any imaging evidence of cholelithiasis.  There was no evidence of biliary obstruction based on imaging and serology.  She was managed conservatively with n.p.o., bowel rest temporarily, pain management.  She was also empirically on antibiotics due to significant leukocytosis, although there was no clear-cut evidence of infected necrosis of the pancreas.  Her diet has been slowly advanced and she was able to tolerate small frequent meals.  She was discharged on pancreatic enzyme supplementation.  Interval summary Patient is accompanied by her daughter today.  She does have ongoing epigastric pain and abdominal distention, worse postprandial.  Overall, she reports feeling significantly better, energy levels are improving, able to perform mild to moderate activity at home, although she does report fatigue the next day.  Reports having 1-2 soft bowel movements daily.  She takes Creon 72K with each meal and 36K with snack.  Her Creon has been approved via patient assistance program, waiting for shipment.  She reports that her blood sugars have been ranging between 150s and 210s.  Otherwise, patient does not have any concerns today  NSAIDs:  None  Antiplts/Anticoagulants/Anti thrombotics: None  GI Procedures: None  Past Medical History:  Diagnosis Date  . Anxiety   . Arthritis   . Cardiac arrhythmia   . Hyperlipidemia   . Stomach ulcer     Past Surgical History:  Procedure Laterality Date  . CYSTECTOMY      Current Outpatient Medications:  .  atenolol (TENORMIN) 50 MG tablet, Take 1 tablet (50 mg total) by mouth daily., Disp: 90 tablet, Rfl: 1 .  blood glucose meter kit and supplies KIT, Dispense based on patient and insurance preference. Use up to four times daily as directed., Disp: 1 each, Rfl: 0 .  co-enzyme Q-10 30 MG capsule, Take 30 mg by mouth 3 (three) times daily., Disp: , Rfl:  .  fluticasone (FLONASE) 50 MCG/ACT nasal spray, Place 2 sprays into both nostrils daily., Disp: 15.8 mL, Rfl: 2 .  lipase/protease/amylase (CREON) 36000 UNITS CPEP capsule, Take 2 capsules (72,000 Units total) by mouth 3 (three) times daily with meals. Take 2 capsules (72,000 Units total) by mouth 3 (three) times daily with meals and 36000 with snack, Disp: 270 capsule, Rfl: 1 .  lisinopril (ZESTRIL) 10 MG tablet, Take 10 mg by mouth daily., Disp: , Rfl:  .  OVER THE COUNTER MEDICATION, Take 3 capsules by mouth daily. [place] Formula, Disp: , Rfl:  .  pantoprazole (PROTONIX) 40 MG tablet, Take 1 tablet (40 mg total) by mouth at bedtime., Disp: 30 tablet, Rfl: 0 .  rosuvastatin (CRESTOR) 10 MG tablet, Take 10 mg by mouth daily. (Patient not taking: Reported on 03/30/2020), Disp: , Rfl:  Family History  Problem Relation Age of Onset  . Heart disease Mother   . Kidney disease Mother   . Diabetes Father   . Cancer Brother        pencreatic     Social History   Tobacco Use  . Smoking status: Current Every Day Smoker    Packs/day: 0.50    Types: Cigarettes  . Smokeless tobacco: Never Used  Vaping Use  . Vaping Use: Never used  Substance Use Topics  . Alcohol use: Yes    Alcohol/week: 0.0 standard  drinks    Comment: occasionally   . Drug use: No    Allergies as of 03/30/2020  . (No Known Allergies)    Review of Systems:    All systems reviewed and negative except where noted in HPI.   Physical Exam:  BP 135/79 (BP Location: Left Arm, Patient Position: Sitting, Cuff Size: Normal)   Pulse 64   Temp 98.1 F (36.7 C) (Oral)   Ht 5' 6"  (1.676 m)   Wt 138 lb (62.6 kg)   BMI 22.27 kg/m  No LMP recorded. Patient is postmenopausal.  General:   Alert,  Well-developed, well-nourished, pleasant and cooperative in NAD Head:  Normocephalic and atraumatic. Eyes:  Sclera clear, no icterus.   Conjunctiva pink. Ears:  Normal auditory acuity. Nose:  No deformity, discharge, or lesions. Mouth:  No deformity or lesions,oropharynx pink & moist. Neck:  Supple; no masses or thyromegaly. Lungs:  Respirations even and unlabored.  Clear throughout to auscultation.   No wheezes, crackles, or rhonchi. No acute distress. Heart:  Regular rate and rhythm; no murmurs, clicks, rubs, or gallops. Abdomen:  Normal bowel sounds. Soft, mildly distended, tympanic to percussion, mild epigastric tenderness without masses, hepatosplenomegaly or hernias noted.  No guarding or rebound tenderness.   Rectal: Not performed Msk:  Symmetrical without gross deformities. Good, equal movement & strength bilaterally. Pulses:  Normal pulses noted. Extremities:  No clubbing or edema.  No cyanosis. Neurologic:  Alert and oriented x3;  grossly normal neurologically. Skin:  Intact without significant lesions or rashes. No jaundice. Psych:  Alert and cooperative. Normal mood and affect.  Imaging Studies: Reviewed  Assessment and Plan:   Holly Navarro is a 75 y.o. pleasant Caucasian female with history of tobacco use, otherwise healthy, who is diagnosed with acute necrotizing pancreatitis without infection is here for follow-up.  It is possible that patient may have underlying chronic pancreatitis.  No evidence of  cholelithiasis based on imaging.  Serum triglycerides less than 500, no known history of diabetes  Continue Creon 36K on 72K capsules with each meal Continue small frequent meals, low-fat, high-protein diet Recommend repeat imaging with MRI pancreas protocol in 2-3 weeks to look for any walled off necrosis, pancreatic fluid collections such as pseudocyst, delineate any underlying pancreatic lesion, underlying chronic pancreatitis Recheck labs today, including hemoglobin A1c   Follow up in 6-8 months   Cephas Darby, MD

## 2020-03-31 LAB — COMPREHENSIVE METABOLIC PANEL
ALT: 12 IU/L (ref 0–32)
AST: 18 IU/L (ref 0–40)
Albumin/Globulin Ratio: 1.3 (ref 1.2–2.2)
Albumin: 4.4 g/dL (ref 3.7–4.7)
Alkaline Phosphatase: 91 IU/L (ref 44–121)
BUN/Creatinine Ratio: 11 — ABNORMAL LOW (ref 12–28)
BUN: 12 mg/dL (ref 8–27)
Bilirubin Total: 0.5 mg/dL (ref 0.0–1.2)
CO2: 20 mmol/L (ref 20–29)
Calcium: 10.2 mg/dL (ref 8.7–10.3)
Chloride: 101 mmol/L (ref 96–106)
Creatinine, Ser: 1.11 mg/dL — ABNORMAL HIGH (ref 0.57–1.00)
Globulin, Total: 3.4 g/dL (ref 1.5–4.5)
Glucose: 180 mg/dL — ABNORMAL HIGH (ref 65–99)
Potassium: 4.3 mmol/L (ref 3.5–5.2)
Sodium: 137 mmol/L (ref 134–144)
Total Protein: 7.8 g/dL (ref 6.0–8.5)
eGFR: 52 mL/min/{1.73_m2} — ABNORMAL LOW (ref >59)

## 2020-03-31 LAB — CBC
Hematocrit: 40 % (ref 34.0–46.6)
Hemoglobin: 12.8 g/dL (ref 11.1–15.9)
MCH: 29.5 pg (ref 26.6–33.0)
MCHC: 32 g/dL (ref 31.5–35.7)
MCV: 92 fL (ref 79–97)
Platelets: 366 10*3/uL (ref 150–450)
RBC: 4.34 x10E6/uL (ref 3.77–5.28)
RDW: 12.2 % (ref 11.7–15.4)
WBC: 6.7 10*3/uL (ref 3.4–10.8)

## 2020-03-31 LAB — HEMOGLOBIN A1C
Est. average glucose Bld gHb Est-mCnc: 143 mg/dL
Hgb A1c MFr Bld: 6.6 % — ABNORMAL HIGH (ref 4.8–5.6)

## 2020-04-01 ENCOUNTER — Telehealth: Payer: Self-pay

## 2020-04-01 NOTE — Telephone Encounter (Signed)
Patient states that she was advise by GI to follow up with her pcp. Patient states that her labs came back normal and that she has a MRI scheduled.

## 2020-04-01 NOTE — Telephone Encounter (Signed)
-----   Message from [name], MD sent at 04/01/2020  7:53 AM EDT ----- Please inform patient that labs are reassuring, CBC and LFTs are normal. Her HbA1C is 6.6, she should follow up with her PCP for further management  RV

## 2020-04-01 NOTE — Telephone Encounter (Signed)
Patient daughter verbalized understanding results

## 2020-04-13 ENCOUNTER — Encounter: Payer: Self-pay | Admitting: Family Medicine

## 2020-04-13 ENCOUNTER — Other Ambulatory Visit: Payer: Self-pay

## 2020-04-13 ENCOUNTER — Ambulatory Visit (INDEPENDENT_AMBULATORY_CARE_PROVIDER_SITE_OTHER): Payer: Medicare Other | Admitting: Family Medicine

## 2020-04-13 VITALS — BP 135/70 | HR 66 | Ht 66.0 in | Wt 137.0 lb

## 2020-04-13 DIAGNOSIS — Z8719 Personal history of other diseases of the digestive system: Secondary | ICD-10-CM | POA: Diagnosis not present

## 2020-04-13 DIAGNOSIS — R739 Hyperglycemia, unspecified: Secondary | ICD-10-CM

## 2020-04-13 DIAGNOSIS — K219 Gastro-esophageal reflux disease without esophagitis: Secondary | ICD-10-CM

## 2020-04-13 MED ORDER — TRESIBA FLEXTOUCH 100 UNIT/ML ~~LOC~~ SOPN: 3.0000 [IU] | PEN_INJECTOR | Freq: Every day | SUBCUTANEOUS | 0 refills | Status: DC

## 2020-04-13 MED ORDER — PANTOPRAZOLE SODIUM 40 MG PO TBEC: 40.0000 mg | DELAYED_RELEASE_TABLET | Freq: Every day | ORAL | 2 refills | Status: DC

## 2020-04-13 MED ORDER — NOVOFINE PEN NEEDLE 32G X 6 MM MISC: 1 refills | Status: DC

## 2020-04-13 NOTE — Patient Instructions (Addendum)
Thank you for coming to the office today.  Fasting blood sugar reading is important to check daily.  Start Tresiba 3 units injection once daily.  If fasting blood sugar is on average < 150 then keep current dose. If >150 you can gradually increase dose by 1 unit every 7 days up to max of 6 units. Then stop and contact us.  If fasting blood sugar is < 100 you can skip that day. If you skip >3 days a week you can reduce dose by 1 unit that week.   Smaller frequent meals will work better.   Please schedule a Follow-up Appointment to: Return if symptoms worsen or fail to improve, for keep upcoming apt in June w/ Rene Kocher.  If you have any other questions or concerns, please feel free to call the office or send a message through MyChart. You may also schedule an earlier appointment if necessary.  Additionally, you may be receiving a survey about your experience at our office within a few days to 1 week by e-mail or mail. We value your feedback.  Saralyn Pilar, DO [facility], [place]

## 2020-04-13 NOTE — Progress Notes (Signed)
Subjective:    Patient ID: Holly Navarro, female    DOB: 1945/08/06, 75 y.o.   MRN: [id number]  Holly Navarro is a 75 y.o. female presenting on 04/13/2020 for Diabetes   HPI   Hyperglycemia following pancreatitis (acute necrotizing, hospitalization) GERD Followed by Dr Thalia Party GI, she was seen on 03/30/20 - Patient is worried that her gallbladder may cause some symptoms with the pancreatitis, asks about options for this in future. Prior imaging did not confirm cholelithiasis however. - She is taking Creon enzymes from GI - Not on therapy for blood sugar. Reports range from 140 to 200+, fluctuates during day and in AM seems lower. Not having low sugars. Never on medicine for it before. No prior history of DM Admits high carb intake foods increase sugar.  Depression screen [facility] 2/9 12/11/2019 09/17/2018 03/23/2017  Decreased Interest 0 1 0  Down, Depressed, Hopeless 0 1 0  PHQ - 2 Score 0 2 0  Altered sleeping - - 0  Tired, decreased energy - - 0  Change in appetite - - 0  Feeling bad or failure about yourself  - - 0  Trouble concentrating - - 0  Moving slowly or fidgety/restless - - 0  Suicidal thoughts - - 0  PHQ-9 Score - - 0  Difficult doing work/chores - - Not difficult at all    Social History   Tobacco Use  . Smoking status: Current Every Day Smoker    Packs/day: 0.50    Types: Cigarettes  . Smokeless tobacco: Never Used  Vaping Use  . Vaping Use: Never used  Substance Use Topics  . Alcohol use: Yes    Alcohol/week: 0.0 standard drinks    Comment: occasionally   . Drug use: No    Review of Systems Per HPI unless specifically indicated above     Objective:    BP 135/70   Pulse 66   Ht 5' 6" (1.676 m)   Wt 137 lb (62.1 kg)   SpO2 100%   BMI 22.11 kg/m   Wt Readings from Last 3 Encounters:  04/13/20 137 lb (62.1 kg)  03/30/20 138 lb (62.6 kg)  03/17/20 142 lb (64.4 kg)    Physical Exam Vitals and nursing note reviewed.  Constitutional:       General: She is not in acute distress.    Appearance: She is well-developed. She is not diaphoretic.     Comments: Well-appearing, comfortable, cooperative  HENT:     Head: Normocephalic and atraumatic.  Eyes:     General:        Right eye: No discharge.        Left eye: No discharge.     Conjunctiva/sclera: Conjunctivae normal.  Cardiovascular:     Rate and Rhythm: Normal rate.  Pulmonary:     Effort: Pulmonary effort is normal.  Skin:    General: Skin is warm and dry.     Findings: No erythema or rash.  Neurological:     Mental Status: She is alert and oriented to person, place, and time.  Psychiatric:        Behavior: Behavior normal.     Comments: Well groomed, good eye contact, normal speech and thoughts      Results for orders placed or performed in visit on 03/30/20  CBC  Result Value Ref Range   WBC 6.7 3.4 - 10.8 x10E3/uL   RBC 4.34 3.77 - 5.28 x10E6/uL   Hemoglobin 12.8 11.1 - 15.9 g/dL  Hematocrit 40.0 34.0 - 46.6 %   MCV 92 79 - 97 fL   MCH 29.5 26.6 - 33.0 pg   MCHC 32.0 31.5 - 35.7 g/dL   RDW 12.2 11.7 - 15.4 %   Platelets 366 150 - 450 x10E3/uL  Comprehensive Metabolic Panel (CMET)  Result Value Ref Range   Glucose 180 (H) 65 - 99 mg/dL   BUN 12 8 - 27 mg/dL   Creatinine, Ser 1.11 (H) 0.57 - 1.00 mg/dL   eGFR 52 (L) >59 mL/min/1.73   BUN/Creatinine Ratio 11 (L) 12 - 28   Sodium 137 134 - 144 mmol/L   Potassium 4.3 3.5 - 5.2 mmol/L   Chloride 101 96 - 106 mmol/L   CO2 20 20 - 29 mmol/L   Calcium 10.2 8.7 - 10.3 mg/dL   Total Protein 7.8 6.0 - 8.5 g/dL   Albumin 4.4 3.7 - 4.7 g/dL   Globulin, Total 3.4 1.5 - 4.5 g/dL   Albumin/Globulin Ratio 1.3 1.2 - 2.2   Bilirubin Total 0.5 0.0 - 1.2 mg/dL   Alkaline Phosphatase 91 44 - 121 IU/L   AST 18 0 - 40 IU/L   ALT 12 0 - 32 IU/L  HgB A1c  Result Value Ref Range   Hgb A1c MFr Bld 6.6 (H) 4.8 - 5.6 %   Est. average glucose Bld gHb Est-mCnc 143 mg/dL      Assessment & Plan:   Problem List Items  Addressed This Visit   None   Visit Diagnoses    Hyperglycemia    -  Primary   Relevant Medications   TRESIBA FLEXTOUCH 100 UNIT/ML FlexTouch Pen   Insulin Pen Needle (NOVOFINE PEN NEEDLE) 32G X 6 MM MISC   History of acute pancreatitis       Relevant Medications   TRESIBA FLEXTOUCH 100 UNIT/ML FlexTouch Pen   Insulin Pen Needle (NOVOFINE PEN NEEDLE) 32G X 6 MM MISC   Gastroesophageal reflux disease, unspecified whether esophagitis present       Relevant Medications   pantoprazole (PROTONIX) 40 MG tablet       Hx of Acute necrotizing pancreatitis Hyperglycemia GERD  Recent elevated A1c up to 6.6 now in range of Type 2 DM, but no prior history of DM, secondary problem now hyperglycemia due to pancreatitis  Followed by GI, on pancreatic enzymes. Continues to follow up has MRI soon.  Start therapy for hyperglycemia now with insulin, will give sample pen Tresiba for glargine insulin 3units daily in AM with meal, can titrate as advised  If fasting blood sugar is on average < 150 then keep current dose. If >150 you can gradually increase dose by 1 unit every 7 days up to max of 6 units. Then stop and contact us.  If fasting blood sugar is < 100 you can skip that day. If you skip >3 days a week you can reduce dose by 1 unit that week.  May warrant future Endocrinology if difficulty managing this problem.  Caution hypoglycemia.  Meds ordered this encounter  Medications  . TRESIBA FLEXTOUCH 100 UNIT/ML FlexTouch Pen    Sig: Inject 3 Units into the skin daily.    Dispense:  3 mL    Refill:  0  . pantoprazole (PROTONIX) 40 MG tablet    Sig: Take 1 tablet (40 mg total) by mouth at bedtime.    Dispense:  30 tablet    Refill:  2  . Insulin Pen Needle (NOVOFINE PEN NEEDLE) 32G X 6 MM  MISC    Sig: Use to inject insulin daily as directed.    Dispense:  90 each    Refill:  1      Follow up plan: Return if symptoms worsen or fail to improve, for keep upcoming apt in June w/  Regina.   Nobie Putnam, [facility] 04/13/2020, 2:40 PM

## 2020-04-14 ENCOUNTER — Telehealth: Payer: Self-pay | Admitting: Family Medicine

## 2020-04-14 ENCOUNTER — Ambulatory Visit
Admission: RE | Admit: 2020-04-14 | Discharge: 2020-04-14 | Disposition: A | Discharge status: Home / Self Care | Payer: Medicare Other | Source: Ambulatory Visit | Attending: Gastroenterology | Admitting: Gastroenterology

## 2020-04-14 DIAGNOSIS — R739 Hyperglycemia, unspecified: Secondary | ICD-10-CM

## 2020-04-14 DIAGNOSIS — K8591 Acute pancreatitis with uninfected necrosis, unspecified: Secondary | ICD-10-CM | POA: Insufficient documentation

## 2020-04-14 DIAGNOSIS — Z8719 Personal history of other diseases of the digestive system: Secondary | ICD-10-CM

## 2020-04-14 MED ORDER — TRESIBA FLEXTOUCH 100 UNIT/ML ~~LOC~~ SOPN: 3.0000 [IU] | PEN_INJECTOR | Freq: Every day | SUBCUTANEOUS | 0 refills | Status: DC

## 2020-04-14 MED ORDER — GADOBUTROL 1 MMOL/ML IV SOLN
6.0000 mL | Freq: Once | INTRAVENOUS | Status: AC | PRN
  Administered 2020-04-14: 6 mL via INTRAVENOUS

## 2020-04-14 NOTE — Telephone Encounter (Signed)
Pharmacy called saying they did not get the order for the Tresiba.  Pt told them it should have been sent in with the pens.

## 2020-04-16 ENCOUNTER — Other Ambulatory Visit: Payer: Self-pay | Admitting: Gastroenterology

## 2020-04-16 ENCOUNTER — Other Ambulatory Visit: Payer: Self-pay

## 2020-04-16 ENCOUNTER — Telehealth: Payer: Self-pay

## 2020-04-16 DIAGNOSIS — K8591 Acute pancreatitis with uninfected necrosis, unspecified: Secondary | ICD-10-CM

## 2020-04-16 DIAGNOSIS — K862 Cyst of pancreas: Secondary | ICD-10-CM

## 2020-04-16 NOTE — Telephone Encounter (Signed)
-----   Message from [name], MD sent at 04/16/2020  2:59 PM EDT ----- Called patient to discuss about MRI results.  She does have large complex pseudocyst of the pancreas compressing on the major veins.  Clinically, she is doing well.  Advised her that I will refer her to Dr. Meridee Score to discuss about possible drainage procedure and she is agreeable to it.  Holly Navarro Please put in a referral to Dr. Meridee Score with [facility] Diagnosis: Complex pseudocyst of the pancreas Also, order ANA and IgG4 levels, she will go to nearby [facility]

## 2020-04-16 NOTE — Telephone Encounter (Signed)
Order lab work and placed referral

## 2020-04-20 NOTE — Telephone Encounter (Signed)
Dr. Meridee Score received referral fromDr [facility] office. Please advise if we need to bring patient in for an office visit first to discuss.

## 2020-04-21 ENCOUNTER — Encounter: Payer: Self-pay | Admitting: Gastroenterology

## 2020-04-22 ENCOUNTER — Other Ambulatory Visit (INDEPENDENT_AMBULATORY_CARE_PROVIDER_SITE_OTHER): Payer: Medicare Other

## 2020-04-22 ENCOUNTER — Other Ambulatory Visit: Payer: Self-pay

## 2020-04-22 ENCOUNTER — Encounter: Payer: Self-pay | Admitting: Gastroenterology

## 2020-04-22 ENCOUNTER — Ambulatory Visit (INDEPENDENT_AMBULATORY_CARE_PROVIDER_SITE_OTHER): Payer: Medicare Other | Admitting: Gastroenterology

## 2020-04-22 VITALS — BP 110/80 | HR 64 | Ht 65.5 in | Wt 135.2 lb

## 2020-04-22 DIAGNOSIS — K862 Cyst of pancreas: Secondary | ICD-10-CM | POA: Diagnosis not present

## 2020-04-22 DIAGNOSIS — R935 Abnormal findings on diagnostic imaging of other abdominal regions, including retroperitoneum: Secondary | ICD-10-CM

## 2020-04-22 DIAGNOSIS — K8591 Acute pancreatitis with uninfected necrosis, unspecified: Secondary | ICD-10-CM

## 2020-04-22 DIAGNOSIS — K8501 Idiopathic acute pancreatitis with uninfected necrosis: Secondary | ICD-10-CM | POA: Diagnosis not present

## 2020-04-22 DIAGNOSIS — K8681 Exocrine pancreatic insufficiency: Secondary | ICD-10-CM

## 2020-04-22 DIAGNOSIS — Z800 Family history of malignant neoplasm of digestive organs: Secondary | ICD-10-CM

## 2020-04-22 LAB — COMPREHENSIVE METABOLIC PANEL
ALT: 12 U/L (ref 0–35)
AST: 15 U/L (ref 0–37)
Albumin: 4.2 g/dL (ref 3.5–5.2)
Alkaline Phosphatase: 71 U/L (ref 39–117)
BUN: 19 mg/dL (ref 6–23)
CO2: 26 mEq/L (ref 19–32)
Calcium: 10.4 mg/dL (ref 8.4–10.5)
Chloride: 102 mEq/L (ref 96–112)
Creatinine, Ser: 1.06 mg/dL (ref 0.40–1.20)
GFR: 51.55 mL/min — ABNORMAL LOW (ref >60.00)
Glucose, Bld: 172 mg/dL — ABNORMAL HIGH (ref 70–99)
Potassium: 4.3 mEq/L (ref 3.5–5.1)
Sodium: 136 mEq/L (ref 135–145)
Total Bilirubin: 0.6 mg/dL (ref 0.2–1.2)
Total Protein: 7.7 g/dL (ref 6.0–8.3)

## 2020-04-22 LAB — CBC
HCT: 42.9 % (ref 36.0–46.0)
Hemoglobin: 14.2 g/dL (ref 12.0–15.0)
MCHC: 33.1 g/dL (ref 30.0–36.0)
MCV: 90.9 fl (ref 78.0–100.0)
Platelets: 240 10*3/uL (ref 150.0–400.0)
RBC: 4.72 Mil/uL (ref 3.87–5.11)
RDW: 13.5 % (ref 11.5–15.5)
WBC: 8.2 10*3/uL (ref 4.0–10.5)

## 2020-04-22 LAB — C-REACTIVE PROTEIN: CRP: <1 mg/dL (ref 0.5–20.0)

## 2020-04-22 LAB — SEDIMENTATION RATE: Sed Rate: 10 mm/hr (ref 0–30)

## 2020-04-22 LAB — PROTIME-INR
INR: 1.1 ratio — ABNORMAL HIGH (ref 0.8–1.0)
Prothrombin Time: 12.8 s (ref 9.6–13.1)

## 2020-04-22 NOTE — Patient Instructions (Addendum)
Your provider has requested that you go to the basement level for lab work before leaving today. Press "B" on the elevator. The lab is located at the first door on the left as you exit the elevator.   Send Mychart message with daily weights.   If decrease in weight is significant then may need to consider stenting of cyst.   Increase Protein Shakes to 2-3  Daily.   Please keep follow up appt: 05/13/20 @ 9:30am  If you are age 75 or older, your body mass index should be between 23-30. Your Body mass index is 22.16 kg/m. If this is out of the aforementioned range listed, please consider follow up with your Primary Care Provider.  If you are age 4 or younger, your body mass index should be between 19-25. Your Body mass index is 22.16 kg/m. If this is out of the aformentioned range listed, please consider follow up with your Primary Care Provider.    Due to recent changes in healthcare laws, you may see the results of your imaging and laboratory studies on MyChart before your provider has had a chance to review them.  We understand that in some cases there may be results that are confusing or concerning to you. Not all laboratory results come back in the same time frame and the provider may be waiting for multiple results in order to interpret others.  Please give [place] 48 hours in order for your provider to thoroughly review all the results before contacting the office for clarification of your results.   Thank you for choosing me and [facility].  Dr. Meridee Score

## 2020-04-22 NOTE — Progress Notes (Signed)
[place] VISIT   Primary Care Provider Wimer, Lupita Raider, FNP No address on file None  Referring Provider Dr. Marius Ditch   Patient Profile: Holly Navarro is a 75 y.o. female with a pmh significant for hypertension, hyperlipidemia anxiety, arthritis peptic ulcer disease, seizures, AAA, family history colon cancer (son), family history of pancreas cancer (brother), recent idiopathic pancreatitis complicated pancreatic pseudocyst with possible walled off necrosis and new onset diabetes.  The patient presents to the [facility] for an evaluation and management of problem(s) noted below:  Problem List 1. Cyst of pancreas   2. Acute necrotizing pancreatitis   3. Exocrine pancreatic insufficiency   4. Idiopathic acute pancreatitis with uninfected necrosis   5. Abnormal MRI of pancreas   6. Family history of colon cancer   7. Family history of pancreatic cancer     History of Present Illness This is the patient's first visit to the outpatient [facility].  The patient is followed by Dr. Marius Ditch of [facility].  Patient recently admitted at the end of February into the beginning of March with pancreatitis.  Etiology of pancreatitis was not clear based on imaging not showing evidence of overt cholelithiasis.  She had a protracted course requiring n.p.o. bowel rest initially with significant issues in regards to pain management.  Eventually the patient was initiated on antibiotics as a result of progressive leukocytosis though no overt infection necrosis was present at the time.  Slowly her diet was able to be advanced and she was able to be discharged in the middle of March.  She was discharged on pancreatic enzyme replacement therapy.  Patient followed up with Dr. Marius Ditch at the end of March subsequently underwent a RI abdomen with results as below showing significant evidence of pancreatic pseudocyst with necrosis necrotizing pancreatitis still present.   Patient at time of her clinic visit at the end of March was feeling better with improving energy and having normal bowel movements while taking Creon.  Due to the findings of the abnormal MRI, she is referred for consideration of pancreatic cyst gastrostomy and potential necrosectomy.  The patient is unaccompanied today.  The patient states that overall she has been continuing to do well.  Her weight has decreased significantly from when she entered the hospital to where she is today although she feels things have been stabilizing.  She does have some early satiety.  She denies any nausea or vomiting.  She has some abdominal pain when she lays on her left side or presses on it otherwise she does not feel any postprandial abdominal pain.  She is having normal, formed bowel movements once to twice daily.  She continues on her pancreatic enzyme replacement therapy.  Prior to all of this, the patient did not have a history of diabetes but her blood sugars suggest that she has currently/now.  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  GI Review of Systems Positive as above Negative for pyrosis, dysphagia, odynophagia, change in bowel habits, melena, hematochezia  Review of Systems General: Denies fevers/chills HEENT: Denies oral lesions Cardiovascular: Denies chest pain/palpitations Pulmonary: Denies shortness of breath Gastroenterological: See HPI Genitourinary: Denies darkened urine Hematological: Denies easy bruising/bleeding Dermatological: Denies jaundice Psychological: Mood is stable   Medications Current Outpatient Medications  Medication Sig Dispense Refill  . atenolol (TENORMIN) 50 MG  tablet Take 1 tablet (50 mg total) by mouth daily. 90 tablet 1  . blood glucose meter kit and supplies KIT Dispense based on patient and insurance preference.  Use up to four times daily as directed. 1 each 0  . co-enzyme Q-10 30 MG capsule Take 30 mg by mouth 3 (three) times daily.    . fluticasone (FLONASE) 50 MCG/ACT nasal spray Place 2 sprays into both nostrils daily. 15.8 mL 2  . Insulin Pen Needle (NOVOFINE PEN NEEDLE) 32G X 6 MM MISC Use to inject insulin daily as directed. 90 each 1  . lipase/protease/amylase (CREON) 36000 UNITS CPEP capsule Take 2 capsules (72,000 Units total) by mouth 3 (three) times daily with meals. Take 2 capsules (72,000 Units total) by mouth 3 (three) times daily with meals and 36000 with snack 270 capsule 1  . lisinopril (ZESTRIL) 10 MG tablet Take 10 mg by mouth daily.    Marland Kitchen OVER THE COUNTER MEDICATION Take 3 capsules by mouth daily. [place] Formula    . pantoprazole (PROTONIX) 40 MG tablet Take 1 tablet (40 mg total) by mouth at bedtime. 30 tablet 2  . TRESIBA FLEXTOUCH 100 UNIT/ML FlexTouch Pen Inject 3 Units into the skin daily. 3 mL 0  . rosuvastatin (CRESTOR) 10 MG tablet Take 10 mg by mouth daily. (Patient not taking: No sig reported)     No current facility-administered medications for this visit.    Allergies No Known Allergies  Histories Past Medical History:  Diagnosis Date  . Anemia   . Anxiety   . Arthritis   . Cardiac arrhythmia   . Hyperlipidemia   . Hypertension   . Pancreatic pseudocyst   . Seizure ([place])   . Stomach ulcer    Past Surgical History:  Procedure Laterality Date  . LAPAROSCOPIC OVARIAN CYSTECTOMY    . TONSILLECTOMY     Social History   Socioeconomic History  . Marital status: Widowed    Spouse name: Not on file  . Number of children: 4  . Years of education: Not on file  . Highest education level: Not on file  Occupational History  . Occupation: retired  Tobacco Use  . Smoking status: Former Smoker    Packs/day: 0.50    Types: Cigarettes    Quit date: 02/29/2020    Years since quitting: 0.1  . Smokeless tobacco: Never Used  Vaping Use  .  Vaping Use: Never used  Substance and Sexual Activity  . Alcohol use: Yes    Alcohol/week: 0.0 standard drinks    Comment: occasionally   . Drug use: No  . Sexual activity: Not on file  Other Topics Concern  . Not on file  Social History Narrative  . Not on file   Social Determinants of Health   Financial Resource Strain: Not on file  Food Insecurity: Not on file  Transportation Needs: Not on file  Physical Activity: Not on file  Stress: Not on file  Social Connections: Not on file  Intimate Partner Violence: Not on file   Family History  Problem Relation Age of Onset  . Heart disease Mother   . Kidney disease Mother   . Diabetes Father   . Pancreatic cancer Brother        Agent Orange  . Diabetes Paternal Uncle        x 2  . Colon cancer Son 29  . Esophageal cancer Neg Hx   . Inflammatory bowel disease Neg Hx   .  Liver disease Neg Hx   . Stomach cancer Neg Hx    I have reviewed her medical, social, and family history in detail and updated the electronic medical record as necessary.    PHYSICAL EXAMINATION  BP 110/80 (BP Location: Left Arm, Patient Position: Sitting, Cuff Size: Normal)   Pulse 64   Ht 5' 5.5" (1.664 m) Comment: height measured without shoes  Wt 135 lb 4 oz (61.3 kg)   BMI 22.16 kg/m  Wt Readings from Last 3 Encounters:  04/22/20 135 lb 4 oz (61.3 kg)  04/13/20 137 lb (62.1 kg)  03/30/20 138 lb (62.6 kg)  GEN: NAD, appears stated age, doesn't appear chronically ill PSYCH: Cooperative, without pressured speech EYE: Conjunctivae pink, sclerae anicteric ENT: Masked CV: Nontachycardic RESP: No audible wheezing GI: NABS, soft, mild tenderness to palpation in midepigastrium and left upper quadrant region nondistended, without rebound, no HSM appreciated MSK/EXT: No lower extremity edema SKIN: No jaundice NEURO:  Alert & Oriented x 3, no focal deficits   REVIEW OF DATA  I reviewed the following data at the time of this encounter:  GI  Procedures and Studies  No relevant studies to review  Laboratory Studies  Reviewed those in epic and care everywhere  Imaging Studies  February 29, 2020 CT abdomen pelvis with contrast IMPRESSION: 1. Severe Acute Pancreatitis, with subtotal associated Pancreatic Necrosis. 2. Moderate volume free fluid and inflammation throughout the upper abdomen. No organized or rim enhancing fluid collection at this time. No vascular occlusion or other complicating features. 3. Infrarenal abdominal aortic aneurysm measuring 31 mm. Recommend follow-up Ultrasound every 3 years. This recommendation follows ACR consensus guidelines: White Paper of the ACR Incidental Findings Committee II on Vascular Findings. J Am Coll Radiol 2013; 09:604-540. 4. Aortic Atherosclerosis (ICD10-I70.0) and Emphysema (ICD10-J43.9).  March 08, 2020 CT chest abdomen pelvis with contrast IMPRESSION: 1. Revisualization of necrotic pancreatitis with exuberant adjacent fat stranding and scattered free fluid throughout the abdomen. No focal drainable fluid collection or pseudoaneurysm at this point in time. 2. Small LEFT pleural effusion, increased in comparison to prior. 3. Likely reactive mild ileus and bowel wall thickening of the colon. No evidence of bowel obstruction. 4. Small pulmonary nodules measuring up to 6 mm. Recommend follow-up CT in 1 year to assess for stability. 5. Revisualization of infrarenal abdominal aortic aneurysm measuring up to 31 mm. Recommend follow-up every 3 years. This recommendation follows ACR consensus guidelines: White Paper of the ACR Incidental Findings Committee II on Vascular Findings. J Am Coll Radiol 2013; 10:789-794.  April 2022 MRI abdomen with and without contrast IMPRESSION: 1. Severe necrotizing pancreatitis redemonstrated with persistent inflammatory changes adjacent to the tail of the pancreas, and very large complex pancreatic pseudocyst exerting mass effect upon adjacent  structures causing significant narrowing of multiple veins (splenic vein, superior mesenteric vein and splenoportal confluence), as detailed above. 2. Aortic atherosclerosis, including infrarenal abdominal aortic aneurysm measuring up to 3.1 x 3.5 cm in diameter. Recommend follow-up ultrasound every 2 years. This recommendation follows ACR consensus guidelines: White Paper of the ACR Incidental Findings Committee II on Vascular Findings. J Am Coll Radiol 2013; 10:789-794.   ASSESSMENT  Ms. Tamas is a 75 y.o. female with a pmh significant for hypertension, hyperlipidemia anxiety, arthritis peptic ulcer disease, seizures, AAA, family history colon cancer (son), family history of pancreas cancer (brother), recent idiopathic pancreatitis complicated pancreatic pseudocyst with possible walled off necrosis and new onset diabetes.  The patient is seen today for evaluation and management of:  1. Cyst of pancreas   2. Acute necrotizing pancreatitis   3. Exocrine pancreatic insufficiency   4. Idiopathic acute pancreatitis with uninfected necrosis   5. Abnormal MRI of pancreas   6. Family history of colon cancer   7. Family history of pancreatic cancer    The patient is hemodynamically stable.  Clinically, compared to her imaging and her protracted hospitalization course/history she seems to be doing very well.  With that being said, she does have a large peripancreatic fluid collection with likely evidence of pancreatic necrosis.  Patient is without symptoms of infection.  Some abdominal discomfort is present.  Experiencing some early satiety per report but weight seems to be stabilizing.  I suspect that if we pursue a pancreatic cyst gastrostomy that multiple necrosectomy's may be required.  We certainly can entertain and consider this.  With that being said, I would asked that the patient try to optimize her diet further and see how her weight does over the course of the next 1 to 2 weeks.  If she has a  progressive decline in her weight and has persistent early satiety, and we likely will need to move forward with endoscopic evaluation and treatment.  If the patient develops fevers or chills or evidence of recurrent pancreatitis and we will need to pursue earlier intervention.  Based on the vasculature that is being compressed, this may be a reason even if the patient does do well and maintain weight that we may need to consider endoscopic drainage so that we do not have the patient developed gastric varices or develop significant issues within the spleen as a result of the vasculature becoming thrombosed or compressed to the point of ischemia.  I plan to see the patient back in 3 weeks.  The patient will let me know how she does over the course of the next 10 days to see if her weight continues to decline at which point we will likely need to consider an earlier intervention.  The risks of an EUS, including intestinal perforation, bleeding, infection, aspiration, and medication effects were discussed.  When a cystgastrostomy/cystenterostomy is performed as part of the EUS, there is an additional risk of pancreatitis at the rate of about 1-2%.  It was explained that procedure related pancreatitis is typically mild, although at times it can be severe and even life threatening.  All patient questions were answered to the best of my ability, and the patient agrees to the aforementioned plan of action with follow-up as indicated.   PLAN  Preprocedure labs to be obtained if necessary Inflammatory markers to be obtained to see how things are moving in regards to previous levels Daily to every other day weights to be monitored by patient and alert [place] via MyChart Try to do 2-3 protein shakes daily in effort of trying to optimize calories and nutrition Small meals more frequently Diabetes control as per primary care If issues of progressive early satiety with weight loss develop and/or jaundice and/or recurrent  pancreatitis then we will need to consider earlier endoscopic evaluation for potential cyst gastrostomy Follow-up in 3 weeks or earlier   Orders Placed This Encounter  Procedures  . CBC  . Comp Met (CMET)  . Sedimentation rate  . C-reactive protein  . INR/PT    New Prescriptions   No medications on file   Modified Medications   No medications on file    Planned Follow Up No follow-ups on file.   Total Time in Face-to-Face and in  Coordination of Care for patient including independent/personal interpretation/review of prior testing, medical history, examination, medication adjustment, communicating results with the patient directly, and documentation with the EHR is 55 minutes.   Justice Britain, MD [facility]oscopy Office # [phone]

## 2020-04-26 ENCOUNTER — Encounter: Payer: Self-pay | Admitting: Gastroenterology

## 2020-04-26 DIAGNOSIS — K862 Cyst of pancreas: Secondary | ICD-10-CM | POA: Insufficient documentation

## 2020-04-26 DIAGNOSIS — K8501 Idiopathic acute pancreatitis with uninfected necrosis: Secondary | ICD-10-CM | POA: Insufficient documentation

## 2020-04-26 DIAGNOSIS — Z800 Family history of malignant neoplasm of digestive organs: Secondary | ICD-10-CM | POA: Insufficient documentation

## 2020-04-26 DIAGNOSIS — K8681 Exocrine pancreatic insufficiency: Secondary | ICD-10-CM | POA: Insufficient documentation

## 2020-04-26 DIAGNOSIS — R935 Abnormal findings on diagnostic imaging of other abdominal regions, including retroperitoneum: Secondary | ICD-10-CM | POA: Insufficient documentation

## 2020-04-26 DIAGNOSIS — K8591 Acute pancreatitis with uninfected necrosis, unspecified: Secondary | ICD-10-CM | POA: Insufficient documentation

## 2020-04-27 NOTE — Telephone Encounter (Signed)
Please review.  KP

## 2020-05-13 ENCOUNTER — Ambulatory Visit (INDEPENDENT_AMBULATORY_CARE_PROVIDER_SITE_OTHER): Payer: Medicare Other | Admitting: Gastroenterology

## 2020-05-13 ENCOUNTER — Other Ambulatory Visit (INDEPENDENT_AMBULATORY_CARE_PROVIDER_SITE_OTHER): Payer: Medicare Other

## 2020-05-13 ENCOUNTER — Ambulatory Visit: Payer: Medicare Other | Admitting: Gastroenterology

## 2020-05-13 ENCOUNTER — Encounter: Payer: Self-pay | Admitting: Gastroenterology

## 2020-05-13 VITALS — BP 138/70 | HR 62 | Ht 66.0 in | Wt 134.0 lb

## 2020-05-13 DIAGNOSIS — K862 Cyst of pancreas: Secondary | ICD-10-CM

## 2020-05-13 DIAGNOSIS — R6881 Early satiety: Secondary | ICD-10-CM | POA: Diagnosis not present

## 2020-05-13 DIAGNOSIS — Z8719 Personal history of other diseases of the digestive system: Secondary | ICD-10-CM

## 2020-05-13 DIAGNOSIS — R109 Unspecified abdominal pain: Secondary | ICD-10-CM | POA: Diagnosis not present

## 2020-05-13 LAB — LIPASE: Lipase: 30 U/L (ref 11.0–59.0)

## 2020-05-13 LAB — PROTIME-INR
INR: 1.1 ratio — ABNORMAL HIGH (ref 0.8–1.0)
Prothrombin Time: 12.6 s (ref 9.6–13.1)

## 2020-05-13 LAB — CBC
HCT: 45.4 % (ref 36.0–46.0)
Hemoglobin: 14.7 g/dL (ref 12.0–15.0)
MCHC: 32.4 g/dL (ref 30.0–36.0)
MCV: 90.4 fl (ref 78.0–100.0)
Platelets: 261 10*3/uL (ref 150.0–400.0)
RBC: 5.02 Mil/uL (ref 3.87–5.11)
RDW: 13.8 % (ref 11.5–15.5)
WBC: 10.3 10*3/uL (ref 4.0–10.5)

## 2020-05-13 LAB — BASIC METABOLIC PANEL
BUN: 15 mg/dL (ref 6–23)
CO2: 27 mEq/L (ref 19–32)
Calcium: 10.3 mg/dL (ref 8.4–10.5)
Chloride: 101 mEq/L (ref 96–112)
Creatinine, Ser: 1.1 mg/dL (ref 0.40–1.20)
GFR: 49.29 mL/min — ABNORMAL LOW (ref >60.00)
Glucose, Bld: 146 mg/dL — ABNORMAL HIGH (ref 70–99)
Potassium: 4.2 mEq/L (ref 3.5–5.1)
Sodium: 135 mEq/L (ref 135–145)

## 2020-05-13 NOTE — Patient Instructions (Addendum)
Due to recent COVID-19 restrictions implemented by our local and state authorities and in an effort to keep both patients and staff as safe as possible, our hospital system now requires COVID-19 testing prior to any scheduled hospital procedure. Please go to our [facility] location drive thru testing site ([place] on 05/18/20 at  Between 8:00am-1:00pm . Follow the TEAL SIGNS that say "Pre-admit" You will not be billed at the time of testing but may receive a bill later depending on your insurance. The approximate cost of the test is $100. You must agree to quarantine from the time of your testing until the procedure date on 05/20/20. This should include staying at home with ONLY the people you live with. Avoid take-out, grocery store shopping or leaving the house for any non-emergent reason. Failure to have your COVID-19 test done on the date and time you have been scheduled will result in cancellation of procedure. Please call our office at [phone] if you have any questions.   If you are age 75 or older, your body mass index should be between 23-30. Your Body mass index is 21.63 kg/m. If this is out of the aforementioned range listed, please consider follow up with your Primary Care Provider.  Your provider has requested that you go to the basement level for lab work before leaving today. Press "B" on the elevator. The lab is located at the first door on the left as you exit the elevator.   [facility] - [address] [place] are scheduled on 05/27/20  at 1:30pm. You should arrive 15 minutes prior to your appointment time for registration.   Please follow the written instructions below -Please go to :  [facility] - [address] [place]  -at least 3 days prior to CT to pick contrast and further instructions   You may take any medications as prescribed with a small amount of water, if necessary. If you take  any of the following medications: METFORMIN, GLUCOPHAGE, GLUCOVANCE, AVANDAMET, RIOMET, FORTAMET, [place] MET, JANUMET, GLUMETZA or METAGLIP, you MAY be asked to HOLD this medication 48 hours AFTER the exam.   The purpose of you drinking the oral contrast is to aid in the visualization of your intestinal tract. The contrast solution may cause some diarrhea. Depending on your individual set of symptoms, you may also receive an intravenous injection of x-ray contrast/dye. Plan on being at [facility] for 45 minutes or longer, depending on the type of exam you are having performed.   If you have any questions regarding your exam or if you need to reschedule, you may call [facility] at [phone] between the hours of 8:00 am and 5:00 pm, Monday-Friday.   You have been scheduled for an endoscopy. Please follow written instructions given to you at your visit today. If you use inhalers (even only as needed), please bring them with you on the day of your procedure.  Due to recent changes in healthcare laws, you may see the results of your imaging and laboratory studies on MyChart before your provider has had a chance to review them.  We understand that in some cases there may be results that are confusing or concerning to you. Not all laboratory results come back in the same time frame and the provider may be waiting for multiple results in order to interpret others.  Please give [place] 48 hours in order for your provider  to thoroughly review all the results before contacting the office for clarification of your results.   Thank you for choosing me and [facility].  Dr. Rush Landmark

## 2020-05-15 NOTE — Progress Notes (Signed)
Attempted to obtain medical history via telephone, unable to reach at this time. No voicemail to leave message.  Cell number not in service.

## 2020-05-18 ENCOUNTER — Other Ambulatory Visit (HOSPITAL_COMMUNITY): Payer: Medicare Other

## 2020-05-18 ENCOUNTER — Other Ambulatory Visit: Payer: Self-pay

## 2020-05-18 ENCOUNTER — Other Ambulatory Visit
Admission: RE | Admit: 2020-05-18 | Discharge: 2020-05-18 | Disposition: A | Discharge status: Home / Self Care | Payer: Medicare Other | Source: Ambulatory Visit | Attending: Gastroenterology | Admitting: Gastroenterology

## 2020-05-18 DIAGNOSIS — Z01812 Encounter for preprocedural laboratory examination: Secondary | ICD-10-CM | POA: Insufficient documentation

## 2020-05-18 DIAGNOSIS — Z20822 Contact with and (suspected) exposure to covid-19: Secondary | ICD-10-CM | POA: Insufficient documentation

## 2020-05-18 LAB — SARS CORONAVIRUS 2 (TAT 6-24 HRS): SARS Coronavirus 2: NEGATIVE

## 2020-05-19 ENCOUNTER — Encounter: Payer: Self-pay | Admitting: Gastroenterology

## 2020-05-19 ENCOUNTER — Encounter (HOSPITAL_COMMUNITY): Payer: Self-pay | Admitting: Gastroenterology

## 2020-05-19 DIAGNOSIS — R6881 Early satiety: Secondary | ICD-10-CM | POA: Insufficient documentation

## 2020-05-19 DIAGNOSIS — R739 Hyperglycemia, unspecified: Secondary | ICD-10-CM

## 2020-05-19 DIAGNOSIS — R109 Unspecified abdominal pain: Secondary | ICD-10-CM | POA: Insufficient documentation

## 2020-05-19 DIAGNOSIS — Z8719 Personal history of other diseases of the digestive system: Secondary | ICD-10-CM | POA: Insufficient documentation

## 2020-05-19 NOTE — Anesthesia Preprocedure Evaluation (Addendum)
Anesthesia Evaluation  Patient identified by MRN, date of birth, ID band Patient awake    Reviewed: Allergy & Precautions, NPO status , Patient's Chart, lab work & pertinent test results, reviewed documented beta blocker date and time   Airway Mallampati: II  TM Distance: >3 FB Neck ROM: Full    Dental  (+) Dental Advisory Given, Edentulous Upper, Partial Lower, Missing,    Pulmonary Patient abstained from smoking., former smoker,    Pulmonary exam normal breath sounds clear to auscultation       Cardiovascular hypertension, Pt. on medications and Pt. on home beta blockers Normal cardiovascular exam Rhythm:Regular Rate:Normal  EKG 02/29/20 NSR, LAD, anteroseptal MI age undetermined  Echo 03/02/20 1. Left ventricular ejection fraction, by estimation, is 65 to 70%. The left ventricle has normal function. The left ventricle has no regional wall motion abnormalities. Left ventricular diastolic parameters are consistent with Grade I diastolic dysfunction (impaired relaxation).  2. Right ventricular systolic function is normal. The right ventricular size is normal. Tricuspid regurgitation signal is inadequate for assessing PA pressure.  3. The mitral valve is normal in structure. No evidence of mitral valve regurgitation. No evidence of mitral stenosis.  4. The aortic valve is normal in structure. Aortic valve regurgitation is not visualized. Mild aortic valve stenosis. Aortic valve area, by VTI measures 2.07 cm. Aortic valve mean gradient measures 6.0 mmHg.  5. The inferior vena cava is normal in size with greater than 50% respiratory variability, suggesting right atrial pressure of 3 mmHg.   Neuro/Psych Seizures -, Well Controlled,  Anxiety    GI/Hepatic Neg liver ROS, PUD, Pancreatic pseudocyst Hx/o idiopathic necrotizing pancreatitis   Endo/Other  Hyperlipidemia  Renal/GU Renal InsufficiencyRenal disease  negative  genitourinary   Musculoskeletal  (+) Arthritis , Osteoarthritis,    Abdominal   Peds  Hematology  (+) anemia ,   Anesthesia Other Findings   Reproductive/Obstetrics                          Anesthesia Physical Anesthesia Plan  ASA: II  Anesthesia Plan: General   Post-op Pain Management:    Induction: Intravenous  PONV Risk Score and Plan: 4 or greater and Treatment may vary due to age or medical condition, Propofol infusion and Ondansetron  Airway Management Planned: Oral ETT  Additional Equipment:   Intra-op Plan:   Post-operative Plan: Extubation in OR  Informed Consent: I have reviewed the patients History and Physical, chart, labs and discussed the procedure including the risks, benefits and alternatives for the proposed anesthesia with the patient or authorized representative who has indicated his/her understanding and acceptance.     Dental advisory given  Plan Discussed with: CRNA and Anesthesiologist  Anesthesia Plan Comments:       Anesthesia Quick Evaluation

## 2020-05-19 NOTE — H&P (View-Only) (Signed)
[employer] VISIT   Primary Care Provider Malfi, Lupita Raider, FNP No address on file None   Patient Profile: Holly Navarro is a 75 y.o. female with a pmh significant for hypertension, hyperlipidemia anxiety, arthritis peptic ulcer disease, seizures, AAA, family history colon cancer (son), family history of pancreas cancer (brother), recent idiopathic pancreatitis complicated pancreatic pseudocyst with possible walled off necrosis and new onset diabetes.  The patient presents to the [facility] for an evaluation and management of problem(s) noted below:  Problem List 1. Cyst of pancreas   2. Early satiety   3. Recurrent abdominal pain   4. History of pancreatitis     History of Present Illness Please see initial consultation note for full details of HPI.  Interval History The patient has been in close communication with me through [place] over the course of the last few weeks.  Weight, thankfully has remained relatively stable.  However, the patient has had significant issues and episodes of recurrent abdominal discomfort as well as persistent early satiety symptoms.  Thankfully, she has not had any recurrence of the severe pancreatitis gas-like pain that she had previously.  When we had last checked laboratories inflammatory markers had suggested normalcy.  The patient states that she has discussed her issues with all of her family members and they are in agreement with what ever she decides.  She is leaning towards really wanting to have the cyst drained if we feel it is safe for her to have that.  Her diabetes is under better control at this time.  GI Review of Systems Positive as above including very mild pyrosis Negative for odynophagia, dysphagia, change in bowel habits, melena, hematochezia   Review of Systems General: Denies fevers/chills/unintentional weight loss Cardiovascular: Denies chest pain/palpitations Pulmonary: Denies  shortness of breath Gastroenterological: See HPI Genitourinary: Denies darkened urine Hematological: Denies easy bruising/bleeding Dermatological: Denies jaundice Psychological: Mood is stable   Medications Current Outpatient Medications  Medication Sig Dispense Refill  . atenolol (TENORMIN) 50 MG tablet Take 1 tablet (50 mg total) by mouth daily. 90 tablet 1  . blood glucose meter kit and supplies KIT Dispense based on patient and insurance preference. Use up to four times daily as directed. 1 each 0  . co-enzyme Q-10 30 MG capsule Take 30 mg by mouth 3 (three) times daily.    Marland Kitchen ECHINACEA PO Take 900 mg by mouth 2 (two) times daily.    . fluticasone (FLONASE) 50 MCG/ACT nasal spray Place 2 sprays into both nostrils daily. 15.8 mL 2  . Insulin Pen Needle (NOVOFINE PEN NEEDLE) 32G X 6 MM MISC Use to inject insulin daily as directed. 90 each 1  . lipase/protease/amylase (CREON) 36000 UNITS CPEP capsule Take 2 capsules (72,000 Units total) by mouth 3 (three) times daily with meals. Take 2 capsules (72,000 Units total) by mouth 3 (three) times daily with meals and 36000 with snack (Patient taking differently: Take 72,000 Units by mouth See admin instructions. Take 2 capsules (72,000 Units total) by mouth 3 (three) times daily with meals and 36000 with snack) 270 capsule 1  . lisinopril (ZESTRIL) 10 MG tablet Take 10 mg by mouth daily.    Marland Kitchen OVER THE COUNTER MEDICATION Take 1 capsule by mouth 2 (two) times daily. [place] Formula    . pantoprazole (PROTONIX) 40 MG tablet Take 1 tablet (40 mg total) by mouth at bedtime. 30 tablet 2  . Red Yeast Rice 600 MG CAPS Take  600 mg by mouth 2 (two) times daily.    . simethicone (MYLICON) 80 MG chewable tablet Chew 80 mg by mouth in the morning, at noon, in the evening, and at bedtime.    Tyler Aas FLEXTOUCH 100 UNIT/ML FlexTouch Pen Inject 3 Units into the skin daily. (Patient taking differently: Inject 7 Units into the skin daily.) 3  mL 0   No current facility-administered medications for this visit.    Allergies No Known Allergies  Histories Past Medical History:  Diagnosis Date  . Anemia   . Anxiety   . Arthritis   . Cardiac arrhythmia   . Hyperlipidemia   . Hypertension   . Pancreatic pseudocyst   . Seizure ([place])   . Stomach ulcer    Past Surgical History:  Procedure Laterality Date  . LAPAROSCOPIC OVARIAN CYSTECTOMY    . TONSILLECTOMY     Social History   Socioeconomic History  . Marital status: Widowed    Spouse name: Not on file  . Number of children: 4  . Years of education: Not on file  . Highest education level: Not on file  Occupational History  . Occupation: retired  Tobacco Use  . Smoking status: Former Smoker    Packs/day: 0.50    Types: Cigarettes    Quit date: 02/29/2020    Years since quitting: 0.2  . Smokeless tobacco: Never Used  Vaping Use  . Vaping Use: Never used  Substance and Sexual Activity  . Alcohol use: Yes    Alcohol/week: 0.0 standard drinks    Comment: occasionally   . Drug use: No  . Sexual activity: Not on file  Other Topics Concern  . Not on file  Social History Narrative  . Not on file   Social Determinants of Health   Financial Resource Strain: Not on file  Food Insecurity: Not on file  Transportation Needs: Not on file  Physical Activity: Not on file  Stress: Not on file  Social Connections: Not on file  Intimate Partner Violence: Not on file   Family History  Problem Relation Age of Onset  . Heart disease Mother   . Kidney disease Mother   . Diabetes Father   . Pancreatic cancer Brother        Agent Orange  . Diabetes Paternal Uncle        x 2  . Colon cancer Son 54  . Esophageal cancer Neg Hx   . Inflammatory bowel disease Neg Hx   . Liver disease Neg Hx   . Stomach cancer Neg Hx    I have reviewed her medical, social, and family history in detail and updated the electronic medical record as necessary.    PHYSICAL EXAMINATION   BP 138/70   Pulse 62   Ht 5' 6"  (1.676 m)   Wt 134 lb (60.8 kg)   BMI 21.63 kg/m  Wt Readings from Last 3 Encounters:  05/13/20 134 lb (60.8 kg)  04/22/20 135 lb 4 oz (61.3 kg)  04/13/20 137 lb (62.1 kg)  GEN: NAD, appears stated age, nontoxic PSYCH: Cooperative, without pressured speech EYE: Conjunctivae pink, sclerae anicteric ENT: Masked CV: Nontachycardic RESP: No audible wheezing GI: NABS, soft, mild tenderness to palpation in midepigastrium and left upper quadrant region nondistended, without rebound, no HSM appreciated MSK/EXT: No lower extremity edema SKIN: No jaundice NEURO:  Alert & Oriented x 3, no focal deficits   REVIEW OF DATA  I reviewed the following data at the time of this encounter:  GI Procedures and Studies  No relevant studies to review  Laboratory Studies  Reviewed those in epic and care everywhere  Imaging Studies  No new imaging to review   ASSESSMENT  Ms. Wemhoff is a 75 y.o. female with a pmh significant for hypertension, hyperlipidemia anxiety, arthritis peptic ulcer disease, seizures, AAA, family history colon cancer (son), family history of pancreas cancer (brother), recent idiopathic pancreatitis complicated pancreatic pseudocyst with possible walled off necrosis and new onset diabetes.  The patient is seen today for evaluation and management of:  1. Cyst of pancreas   2. Early satiety   3. Recurrent abdominal pain   4. History of pancreatitis    The patient is hemodynamically stable.  Clinically, she seems to be relatively stable but is still having significant early satiety and recurrent abdominal pain episodes/pain.  Based on the symptoms, even though she has not lost a significant amount of weight and is being very aggressive with her protein intake, I do think that it is worthwhile for [place] to attempt a cyst gastrostomy.  It has been nearly 4 weeks since her last imaging.  If we can obtain cross-sectional imaging that would be ideal but if  we cannot, I think moving forward with cyst gastrostomy is reasonable.  There was no evidence of a pseudoaneurysm or anything at that time.  We will get a good sense of things at the time of EUS as well.  The risks of an EUS, including intestinal perforation, bleeding, infection, aspiration, and medication effects were discussed.  When a cystgastrostomy/cystenterostomy is performed as part of the EUS, there is an additional risk of pancreatitis at the rate of about 1-2%.  It was explained that procedure related pancreatitis is typically mild, although at times it can be severe and even life threatening.  The risks and benefits of endoscopic evaluation were discussed with the patient; these include but are not limited to the risk of perforation, infection, bleeding, missed lesions, lack of diagnosis, severe illness requiring hospitalization, as well as anesthesia and sedation related illnesses.  The patient is agreeable to proceed.  All patient questions were answered to the best of my ability, and the patient agrees to the aforementioned plan of action with follow-up as indicated.   PLAN  Preprocedure labs to be obtained Continue to try to do 2-3 protein shakes daily in effort of trying to optimize calories and nutrition Small meals more frequently Diabetes control as per primary care Proceed with scheduling EUS with cyst gastrostomy Proceed with performing CT abdomen without contrast before cyst gastrostomy, if possible   Orders Placed This Encounter  Procedures  . Procedural/ Surgical Case Request: UPPER ESOPHAGEAL ENDOSCOPIC ULTRASOUND (EUS) + cystgastrostomy  . CT ABDOMEN W WO CONTRAST  . CBC  . Basic Metabolic Panel (BMET)  . Lipase  . INR/PT  . Ambulatory referral to Gastroenterology    New Prescriptions   No medications on file   Modified Medications   No medications on file    Planned Follow Up No follow-ups on file.   Total Time in Face-to-Face and in Coordination of Care  for patient including independent/personal interpretation/review of prior testing, medical history, examination, medication adjustment, communicating results with the patient directly, and documentation with the EHR is 30 minutes.   Justice Britain, MD [facility]oscopy Office # [phone]

## 2020-05-19 NOTE — Progress Notes (Signed)
[place] VISIT   Primary Care Provider Malfi, Lupita Raider, FNP No address on file None   Patient Profile: CASTELLA LERNER is a 75 y.o. female with a pmh significant for hypertension, hyperlipidemia anxiety, arthritis peptic ulcer disease, seizures, AAA, family history colon cancer (son), family history of pancreas cancer (brother), recent idiopathic pancreatitis complicated pancreatic pseudocyst with possible walled off necrosis and new onset diabetes.  The patient presents to the [facility] for an evaluation and management of problem(s) noted below:  Problem List 1. Cyst of pancreas   2. Early satiety   3. Recurrent abdominal pain   4. History of pancreatitis     History of Present Illness Please see initial consultation note for full details of HPI.  Interval History The patient has been in close communication with me through [place] over the course of the last few weeks.  Weight, thankfully has remained relatively stable.  However, the patient has had significant issues and episodes of recurrent abdominal discomfort as well as persistent early satiety symptoms.  Thankfully, she has not had any recurrence of the severe pancreatitis gas-like pain that she had previously.  When we had last checked laboratories inflammatory markers had suggested normalcy.  The patient states that she has discussed her issues with all of her family members and they are in agreement with what ever she decides.  She is leaning towards really wanting to have the cyst drained if we feel it is safe for her to have that.  Her diabetes is under better control at this time.  GI Review of Systems Positive as above including very mild pyrosis Negative for odynophagia, dysphagia, change in bowel habits, melena, hematochezia   Review of Systems General: Denies fevers/chills/unintentional weight loss Cardiovascular: Denies chest pain/palpitations Pulmonary: Denies  shortness of breath Gastroenterological: See HPI Genitourinary: Denies darkened urine Hematological: Denies easy bruising/bleeding Dermatological: Denies jaundice Psychological: Mood is stable   Medications Current Outpatient Medications  Medication Sig Dispense Refill  . atenolol (TENORMIN) 50 MG tablet Take 1 tablet (50 mg total) by mouth daily. 90 tablet 1  . blood glucose meter kit and supplies KIT Dispense based on patient and insurance preference. Use up to four times daily as directed. 1 each 0  . co-enzyme Q-10 30 MG capsule Take 30 mg by mouth 3 (three) times daily.    Marland Kitchen ECHINACEA PO Take 900 mg by mouth 2 (two) times daily.    . fluticasone (FLONASE) 50 MCG/ACT nasal spray Place 2 sprays into both nostrils daily. 15.8 mL 2  . Insulin Pen Needle (NOVOFINE PEN NEEDLE) 32G X 6 MM MISC Use to inject insulin daily as directed. 90 each 1  . lipase/protease/amylase (CREON) 36000 UNITS CPEP capsule Take 2 capsules (72,000 Units total) by mouth 3 (three) times daily with meals. Take 2 capsules (72,000 Units total) by mouth 3 (three) times daily with meals and 36000 with snack (Patient taking differently: Take 72,000 Units by mouth See admin instructions. Take 2 capsules (72,000 Units total) by mouth 3 (three) times daily with meals and 36000 with snack) 270 capsule 1  . lisinopril (ZESTRIL) 10 MG tablet Take 10 mg by mouth daily.    Marland Kitchen OVER THE COUNTER MEDICATION Take 1 capsule by mouth 2 (two) times daily. [place] Formula    . pantoprazole (PROTONIX) 40 MG tablet Take 1 tablet (40 mg total) by mouth at bedtime. 30 tablet 2  . Red Yeast Rice 600 MG CAPS Take  600 mg by mouth 2 (two) times daily.    . simethicone (MYLICON) 80 MG chewable tablet Chew 80 mg by mouth in the morning, at noon, in the evening, and at bedtime.    Tyler Aas FLEXTOUCH 100 UNIT/ML FlexTouch Pen Inject 3 Units into the skin daily. (Patient taking differently: Inject 7 Units into the skin daily.) 3  mL 0   No current facility-administered medications for this visit.    Allergies No Known Allergies  Histories Past Medical History:  Diagnosis Date  . Anemia   . Anxiety   . Arthritis   . Cardiac arrhythmia   . Hyperlipidemia   . Hypertension   . Pancreatic pseudocyst   . Seizure ([place])   . Stomach ulcer    Past Surgical History:  Procedure Laterality Date  . LAPAROSCOPIC OVARIAN CYSTECTOMY    . TONSILLECTOMY     Social History   Socioeconomic History  . Marital status: Widowed    Spouse name: Not on file  . Number of children: 4  . Years of education: Not on file  . Highest education level: Not on file  Occupational History  . Occupation: retired  Tobacco Use  . Smoking status: Former Smoker    Packs/day: 0.50    Types: Cigarettes    Quit date: 02/29/2020    Years since quitting: 0.2  . Smokeless tobacco: Never Used  Vaping Use  . Vaping Use: Never used  Substance and Sexual Activity  . Alcohol use: Yes    Alcohol/week: 0.0 standard drinks    Comment: occasionally   . Drug use: No  . Sexual activity: Not on file  Other Topics Concern  . Not on file  Social History Narrative  . Not on file   Social Determinants of Health   Financial Resource Strain: Not on file  Food Insecurity: Not on file  Transportation Needs: Not on file  Physical Activity: Not on file  Stress: Not on file  Social Connections: Not on file  Intimate Partner Violence: Not on file   Family History  Problem Relation Age of Onset  . Heart disease Mother   . Kidney disease Mother   . Diabetes Father   . Pancreatic cancer Brother        Agent Orange  . Diabetes Paternal Uncle        x 2  . Colon cancer Son 24  . Esophageal cancer Neg Hx   . Inflammatory bowel disease Neg Hx   . Liver disease Neg Hx   . Stomach cancer Neg Hx    I have reviewed her medical, social, and family history in detail and updated the electronic medical record as necessary.    PHYSICAL EXAMINATION   BP 138/70   Pulse 62   Ht 5' 6"  (1.676 m)   Wt 134 lb (60.8 kg)   BMI 21.63 kg/m  Wt Readings from Last 3 Encounters:  05/13/20 134 lb (60.8 kg)  04/22/20 135 lb 4 oz (61.3 kg)  04/13/20 137 lb (62.1 kg)  GEN: NAD, appears stated age, nontoxic PSYCH: Cooperative, without pressured speech EYE: Conjunctivae pink, sclerae anicteric ENT: Masked CV: Nontachycardic RESP: No audible wheezing GI: NABS, soft, mild tenderness to palpation in midepigastrium and left upper quadrant region nondistended, without rebound, no HSM appreciated MSK/EXT: No lower extremity edema SKIN: No jaundice NEURO:  Alert & Oriented x 3, no focal deficits   REVIEW OF DATA  I reviewed the following data at the time of this encounter:  GI Procedures and Studies  No relevant studies to review  Laboratory Studies  Reviewed those in epic and care everywhere  Imaging Studies  No new imaging to review   ASSESSMENT  Ms. Quinton is a 75 y.o. female with a pmh significant for hypertension, hyperlipidemia anxiety, arthritis peptic ulcer disease, seizures, AAA, family history colon cancer (son), family history of pancreas cancer (brother), recent idiopathic pancreatitis complicated pancreatic pseudocyst with possible walled off necrosis and new onset diabetes.  The patient is seen today for evaluation and management of:  1. Cyst of pancreas   2. Early satiety   3. Recurrent abdominal pain   4. History of pancreatitis    The patient is hemodynamically stable.  Clinically, she seems to be relatively stable but is still having significant early satiety and recurrent abdominal pain episodes/pain.  Based on the symptoms, even though she has not lost a significant amount of weight and is being very aggressive with her protein intake, I do think that it is worthwhile for [place] to attempt a cyst gastrostomy.  It has been nearly 4 weeks since her last imaging.  If we can obtain cross-sectional imaging that would be ideal but if  we cannot, I think moving forward with cyst gastrostomy is reasonable.  There was no evidence of a pseudoaneurysm or anything at that time.  We will get a good sense of things at the time of EUS as well.  The risks of an EUS, including intestinal perforation, bleeding, infection, aspiration, and medication effects were discussed.  When a cystgastrostomy/cystenterostomy is performed as part of the EUS, there is an additional risk of pancreatitis at the rate of about 1-2%.  It was explained that procedure related pancreatitis is typically mild, although at times it can be severe and even life threatening.  The risks and benefits of endoscopic evaluation were discussed with the patient; these include but are not limited to the risk of perforation, infection, bleeding, missed lesions, lack of diagnosis, severe illness requiring hospitalization, as well as anesthesia and sedation related illnesses.  The patient is agreeable to proceed.  All patient questions were answered to the best of my ability, and the patient agrees to the aforementioned plan of action with follow-up as indicated.   PLAN  Preprocedure labs to be obtained Continue to try to do 2-3 protein shakes daily in effort of trying to optimize calories and nutrition Small meals more frequently Diabetes control as per primary care Proceed with scheduling EUS with cyst gastrostomy Proceed with performing CT abdomen without contrast before cyst gastrostomy, if possible   Orders Placed This Encounter  Procedures  . Procedural/ Surgical Case Request: UPPER ESOPHAGEAL ENDOSCOPIC ULTRASOUND (EUS) + cystgastrostomy  . CT ABDOMEN W WO CONTRAST  . CBC  . Basic Metabolic Panel (BMET)  . Lipase  . INR/PT  . Ambulatory referral to Gastroenterology    New Prescriptions   No medications on file   Modified Medications   No medications on file    Planned Follow Up No follow-ups on file.   Total Time in Face-to-Face and in Coordination of Care  for patient including independent/personal interpretation/review of prior testing, medical history, examination, medication adjustment, communicating results with the patient directly, and documentation with the EHR is 30 minutes.   Justice Britain, MD [facility] Advanced [facility]oscopy Office # [id number]

## 2020-05-20 ENCOUNTER — Encounter (HOSPITAL_COMMUNITY)
Admission: RE | Disposition: A | Discharge status: Home / Self Care | Payer: Self-pay | Source: Home / Self Care | Attending: Gastroenterology

## 2020-05-20 ENCOUNTER — Ambulatory Visit (HOSPITAL_COMMUNITY): Payer: Medicare Other | Admitting: Anesthesiology

## 2020-05-20 ENCOUNTER — Ambulatory Visit (HOSPITAL_COMMUNITY)
Admission: RE | Admit: 2020-05-20 | Discharge: 2020-05-20 | Disposition: A | Discharge status: Home / Self Care | Payer: Medicare Other | Attending: Gastroenterology | Admitting: Gastroenterology

## 2020-05-20 ENCOUNTER — Other Ambulatory Visit: Payer: Self-pay

## 2020-05-20 ENCOUNTER — Encounter (HOSPITAL_COMMUNITY): Payer: Self-pay | Admitting: Gastroenterology

## 2020-05-20 DIAGNOSIS — K8689 Other specified diseases of pancreas: Secondary | ICD-10-CM | POA: Diagnosis not present

## 2020-05-20 DIAGNOSIS — K449 Diaphragmatic hernia without obstruction or gangrene: Secondary | ICD-10-CM | POA: Insufficient documentation

## 2020-05-20 DIAGNOSIS — Z8719 Personal history of other diseases of the digestive system: Secondary | ICD-10-CM | POA: Diagnosis not present

## 2020-05-20 DIAGNOSIS — R1013 Epigastric pain: Secondary | ICD-10-CM | POA: Insufficient documentation

## 2020-05-20 DIAGNOSIS — K2950 Unspecified chronic gastritis without bleeding: Secondary | ICD-10-CM | POA: Insufficient documentation

## 2020-05-20 DIAGNOSIS — R6881 Early satiety: Secondary | ICD-10-CM | POA: Diagnosis not present

## 2020-05-20 DIAGNOSIS — K862 Cyst of pancreas: Secondary | ICD-10-CM

## 2020-05-20 DIAGNOSIS — Z794 Long term (current) use of insulin: Secondary | ICD-10-CM | POA: Diagnosis not present

## 2020-05-20 DIAGNOSIS — K863 Pseudocyst of pancreas: Secondary | ICD-10-CM | POA: Diagnosis not present

## 2020-05-20 DIAGNOSIS — Z79899 Other long term (current) drug therapy: Secondary | ICD-10-CM | POA: Diagnosis not present

## 2020-05-20 DIAGNOSIS — E119 Type 2 diabetes mellitus without complications: Secondary | ICD-10-CM | POA: Diagnosis not present

## 2020-05-20 DIAGNOSIS — Z8711 Personal history of peptic ulcer disease: Secondary | ICD-10-CM | POA: Insufficient documentation

## 2020-05-20 DIAGNOSIS — Z833 Family history of diabetes mellitus: Secondary | ICD-10-CM | POA: Diagnosis not present

## 2020-05-20 DIAGNOSIS — K3189 Other diseases of stomach and duodenum: Secondary | ICD-10-CM

## 2020-05-20 DIAGNOSIS — R1012 Left upper quadrant pain: Secondary | ICD-10-CM | POA: Insufficient documentation

## 2020-05-20 DIAGNOSIS — I10 Essential (primary) hypertension: Secondary | ICD-10-CM | POA: Diagnosis not present

## 2020-05-20 DIAGNOSIS — Z800 Family history of malignant neoplasm of digestive organs: Secondary | ICD-10-CM | POA: Insufficient documentation

## 2020-05-20 DIAGNOSIS — Z87891 Personal history of nicotine dependence: Secondary | ICD-10-CM | POA: Insufficient documentation

## 2020-05-20 HISTORY — PX: ESOPHAGOGASTRODUODENOSCOPY: SHX5428

## 2020-05-20 HISTORY — PX: BIOPSY: SHX5522

## 2020-05-20 HISTORY — PX: UPPER ESOPHAGEAL ENDOSCOPIC ULTRASOUND (EUS): SHX6562

## 2020-05-20 LAB — GLUCOSE, CAPILLARY: Glucose-Capillary: 173 mg/dL — ABNORMAL HIGH (ref 70–99)

## 2020-05-20 SURGERY — UPPER ESOPHAGEAL ENDOSCOPIC ULTRASOUND (EUS)
Anesthesia: General

## 2020-05-20 MED ORDER — ROCURONIUM BROMIDE 10 MG/ML (PF) SYRINGE
PREFILLED_SYRINGE | INTRAVENOUS | Status: DC | PRN
  Administered 2020-05-20: 10 mg via INTRAVENOUS
  Administered 2020-05-20: 30 mg via INTRAVENOUS

## 2020-05-20 MED ORDER — LACTATED RINGERS IV SOLN: INTRAVENOUS | Status: DC | PRN

## 2020-05-20 MED ORDER — SODIUM CHLORIDE 0.9 % IV SOLN: INTRAVENOUS | Status: DC

## 2020-05-20 MED ORDER — PHENYLEPHRINE 40 MCG/ML (10ML) SYRINGE FOR IV PUSH (FOR BLOOD PRESSURE SUPPORT)
PREFILLED_SYRINGE | INTRAVENOUS | Status: DC | PRN
  Administered 2020-05-20: 160 ug via INTRAVENOUS
  Administered 2020-05-20: 120 ug via INTRAVENOUS

## 2020-05-20 MED ORDER — CIPROFLOXACIN IN D5W 400 MG/200ML IV SOLN
INTRAVENOUS | Status: AC
  Filled 2020-05-20: qty 200

## 2020-05-20 MED ORDER — FENTANYL CITRATE (PF) 100 MCG/2ML IJ SOLN
INTRAMUSCULAR | Status: DC | PRN
  Administered 2020-05-20: 50 ug via INTRAVENOUS

## 2020-05-20 MED ORDER — SUGAMMADEX SODIUM 500 MG/5ML IV SOLN
INTRAVENOUS | Status: DC | PRN
  Administered 2020-05-20: 200 mg via INTRAVENOUS

## 2020-05-20 MED ORDER — LIDOCAINE 2% (20 MG/ML) 5 ML SYRINGE
INTRAMUSCULAR | Status: DC | PRN
  Administered 2020-05-20: 60 mg via INTRAVENOUS

## 2020-05-20 MED ORDER — PROPOFOL 500 MG/50ML IV EMUL
INTRAVENOUS | Status: AC
  Filled 2020-05-20: qty 50

## 2020-05-20 MED ORDER — MIDAZOLAM HCL 2 MG/2ML IJ SOLN
INTRAMUSCULAR | Status: AC
  Filled 2020-05-20: qty 2

## 2020-05-20 MED ORDER — PROPOFOL 1000 MG/100ML IV EMUL
INTRAVENOUS | Status: AC
  Filled 2020-05-20: qty 100

## 2020-05-20 MED ORDER — DEXAMETHASONE SODIUM PHOSPHATE 10 MG/ML IJ SOLN
INTRAMUSCULAR | Status: DC | PRN
  Administered 2020-05-20: 2 mg via INTRAVENOUS

## 2020-05-20 MED ORDER — SUCCINYLCHOLINE CHLORIDE 200 MG/10ML IV SOSY
PREFILLED_SYRINGE | INTRAVENOUS | Status: DC | PRN
  Administered 2020-05-20: 100 mg via INTRAVENOUS

## 2020-05-20 MED ORDER — MIDAZOLAM HCL 2 MG/2ML IJ SOLN
INTRAMUSCULAR | Status: DC | PRN
  Administered 2020-05-20: 1 mg via INTRAVENOUS

## 2020-05-20 MED ORDER — ONDANSETRON HCL 4 MG/2ML IJ SOLN
INTRAMUSCULAR | Status: DC | PRN
  Administered 2020-05-20: 4 mg via INTRAVENOUS

## 2020-05-20 MED ORDER — FENTANYL CITRATE (PF) 100 MCG/2ML IJ SOLN
INTRAMUSCULAR | Status: AC
  Filled 2020-05-20: qty 2

## 2020-05-20 MED ORDER — TRESIBA FLEXTOUCH 100 UNIT/ML ~~LOC~~ SOPN: 7.0000 [IU] | PEN_INJECTOR | Freq: Every day | SUBCUTANEOUS | 2 refills | Status: DC

## 2020-05-20 MED ORDER — PROPOFOL 10 MG/ML IV BOLUS
INTRAVENOUS | Status: DC | PRN
  Administered 2020-05-20: 100 mg via INTRAVENOUS

## 2020-05-20 MED ORDER — CIPROFLOXACIN IN D5W 400 MG/200ML IV SOLN
INTRAVENOUS | Status: DC | PRN
  Administered 2020-05-20: 400 mg via INTRAVENOUS

## 2020-05-20 NOTE — Op Note (Signed)
[facility] Patient Name: Holly Navarro Procedure Date: 05/20/2020 MRN: [id number] Attending MD: Justice Britain , MD Date of Birth: [birth year] CSN: [id number] Age: 75 Admit Type: Outpatient Procedure:                Upper EUS Indications:              Pancreatic necrosis, Pancreatic pseudocyst,                            Epigastric abdominal pain, Abdominal pain in the                            left upper quadrant, For cyst enterostomy, Early                            satiety Providers:                Justice Britain, MD, Kary Kos RN, RN,                            Laverda Sorenson, Technician, Glenis Smoker, CRNA Referring MD:             Lin Landsman MD, MD, Lupita Raider. [place] Medicines:                General Anesthesia, Cipro 751 mg IV Complications:            No immediate complications. Estimated Blood Loss:     Estimated blood loss was minimal. Procedure:                Pre-Anesthesia Assessment:                           - Prior to the procedure, a History and Physical                            was performed, and patient medications and                            allergies were reviewed. The patient's tolerance of                            previous anesthesia was also reviewed. The risks                            and benefits of the procedure and the sedation                            options and risks were discussed with the patient.                            All questions were answered, and informed consent                            was obtained. Prior Anticoagulants: The patient has  taken no previous anticoagulant or antiplatelet                            agents except for aspirin. ASA Grade Assessment:                            III - A patient with severe systemic disease. After                            reviewing the risks and benefits, the patient was                            deemed in satisfactory  condition to undergo the                            procedure.                           After obtaining informed consent, the endoscope was                            passed under direct vision. Throughout the                            procedure, the patient's blood pressure, pulse, and                            oxygen saturations were monitored continuously. The                            [id number] ([id number]) Olympus therapeutic endoscope                            was introduced through the mouth, and advanced to                            the second part of duodenum. The [id number]                            ([id number]) Olympus Duodenoscope was introduced                            through the mouth, and advanced to the second part                            of duodenum. The [id number] ([id number]) Olympus                            Forward View EUS was introduced through the mouth,                            and advanced to the duodenum for ultrasound  examination from the stomach and duodenum. The                            [id number] ([id number]) Olympus Linear EUS was                            introduced through the mouth, and advanced to the                            duodenum for ultrasound examination from the                            stomach and duodenum. Scope In: Scope Out: Findings:      ENDOSCOPIC FINDING: :      No gross lesions were noted in the entire esophagus.      The Z-line was regular and was found 38 cm from the incisors.      A 2 cm hiatal hernia was present.      Patchy mildly erythematous mucosa without bleeding was found in the       entire examined stomach. Biopsies were taken with a cold forceps for       histology and Helicobacter pylori testing.      No gross lesions were noted in the duodenal bulb, in the first portion       of the duodenum and in the second portion of the duodenum.      The major papilla was normal.       ENDOSONOGRAPHIC FINDING: :      Pancreatic parenchymal abnormalities were noted in the entire pancreas.       These consisted of diffuse echogenicity, hyperechoic strands,       hyperechoic foci, lobularity and shadowing foci. I visualized what looks       to be mostly walled off aseptic necrosis at this time. At 50-60 cm there       are a few windows where potential AXIOS stent placement could be       considered. It was not placed today.      The pancreatic duct had a normal endosonographic appearance in the       pancreatic head (0.7 mm) but was not able to be visualized as a result       of the WON.      A small amount of hyperechoic material consistent with sludge was       visualized endosonographically in the gallbladder but no stones were       noted.      There was no sign of significant endosonographic abnormality in the       common bile duct and in the common hepatic duct.      Endosonographic imaging in the visualized portion of the liver showed no       mass-lesion.      The celiac region was visualized. Impression:               EGD Impression:                           - No gross lesions in esophagus. Z-line regular, 38  cm from the incisors.                           - 2 cm hiatal hernia.                           - Erythematous mucosa in the stomach. Biopsied.                           - No gross lesions in the duodenal bulb, in the                            first portion of the duodenum and in the second                            portion of the duodenum.                           - Normal major papilla.                           EUS Impression:                           - Pancreatic parenchymal abnormalities consisting                            of diffuse echogenicity, hyperechoic strands,                            hyperechoic foci, lobularity and shadowing foci                            were noted in the entire pancreas. This is most                             consistent with walled off necrosis. Significant                            fluid attenuation is not noted except in some                            portions of the body of the pancreas. At 50 cm                            there is a small window with appropriate                            cyst-stomach wall thickness for attempt at AXIOS.                            There is another region at 55 cm where there is a  small window for possible AXIOS. AXIOS not placed                            today since we will likely transition aseptic                            necrosis to non-sterile necrosis (further                            discussion below).                           - The pancreatic duct had a normal endosonographic                            appearance in the pancreatic head but was difficult                            to visualize in the rest of the pancreas due to the                            [facility].                           - Hyperechoic material consistent with sludge was                            visualized endosonographically in the gallbladder                            but no stones.                           - There was no sign of significant pathology in the                            common bile duct and in the common hepatic duct. Moderate Sedation:      Not Applicable - Patient had care per Anesthesia. Recommendation:           - The patient will be observed post-procedure,                            until all discharge criteria are met.                           - Discharge patient to home.                           - Patient has a contact number available for                            emergencies. The signs and symptoms of potential                            delayed complications were  discussed with the                            patient. Return to normal activities tomorrow.                             Written discharge instructions were provided to the                            patient.                           - Resume previous diet.                           - Observe patient's clinical course.                           - Await path results.                           - Proceed with repeat CT imaging in 1-2 weeks, as                            long as she continues to maintain her weight.                           - I think there is a very high chance that patient                            will end up needing multiple necrosectomies and                            likely hospitalization once we transition this                            aseptic walled off necrosis and attempt                            cystgastrostomy creation. The patient has been able                            for the last 3.5 weeks to maintain her weight, even                            with significant early satiety. I was hopeful that                            this would also improve her episodes of pain (but                            patient today is completely pain free as she has  been on certain days last few weeks). I have held                            off on cystgastrostomy today, but am willing to                            consider if she has progressive weight loss or the                            pain becomes constant or if she develops                            fevers/chills (which she has not) and for which                            current inflammatory markers have been normal. I                            will discuss this further with her today.                           - Set up follow up in clinic in 2-weeks.                           - The findings and recommendations were discussed                            with the patient.                           - The findings and recommendations were discussed                            with the designated responsible  adult. Procedure Code(s):        --- Professional ---                           [phone], Esophagogastroduodenoscopy, flexible,                            transoral; with endoscopic ultrasound examination                            limited to the esophagus, stomach or duodenum, and                            adjacent structures                           43239, Esophagogastroduodenoscopy, flexible,                            transoral; with biopsy, single or multiple Diagnosis Code(s):        --- Professional ---  K44.9, Diaphragmatic hernia without obstruction or                            gangrene                           K31.89, Other diseases of stomach and duodenum                           K86.9, Disease of pancreas, unspecified                           K86.89, Other specified diseases of pancreas                           K86.3, Pseudocyst of pancreas                           R10.13, Epigastric pain                           R10.12, Left upper quadrant pain                           R68.81, Early satiety                           K83.8, Other specified diseases of biliary tract CPT copyright 2019 [employer]. All rights reserved. The codes documented in this report are preliminary and upon coder review may  be revised to meet current compliance requirements. Justice Britain, MD 05/20/2020 10:05:22 AM Number of Addenda: 0

## 2020-05-20 NOTE — Anesthesia Postprocedure Evaluation (Signed)
Anesthesia Post Note  Patient: Holly Navarro  Procedure(s) Performed: UPPER ESOPHAGEAL ENDOSCOPIC ULTRASOUND (EUS)   (N/A ) BIOPSY ESOPHAGOGASTRODUODENOSCOPY (EGD) (N/A )     Patient location during evaluation: PACU Anesthesia Type: General Level of consciousness: awake and alert and oriented Pain management: pain level controlled Vital Signs Assessment: post-procedure vital signs reviewed and stable Respiratory status: spontaneous breathing, nonlabored ventilation and respiratory function stable Cardiovascular status: blood pressure returned to baseline and stable Postop Assessment: no apparent nausea or vomiting Anesthetic complications: no   No complications documented.  Last Vitals:  Vitals:   05/20/20 1000 05/20/20 1010  BP: 127/67   Pulse: 68 64  Resp: 11 14  Temp:    SpO2: 97% 98%    Last Pain:  Vitals:   05/20/20 1010  TempSrc:   PainSc: 0-No pain                 Lianah Peed A.

## 2020-05-20 NOTE — Discharge Instructions (Signed)
YOU HAD AN ENDOSCOPIC PROCEDURE TODAY: Refer to the procedure report and other information in the discharge instructions given to you for any specific questions about what was found during the examination. If this information does not answer your questions, please call [place] office at [phone] to clarify.  ° °YOU SHOULD EXPECT: Some feelings of bloating in the abdomen. Passage of more gas than usual. Walking can help get rid of the air that was put into your GI tract during the procedure and reduce the bloating. If you had a lower endoscopy (such as a colonoscopy or flexible sigmoidoscopy) you may notice spotting of blood in your stool or on the toilet paper. Some abdominal soreness may be present for a day or two, also. ° °DIET: Your first meal following the procedure should be a light meal and then it is ok to progress to your normal diet. A half-sandwich or bowl of soup is an example of a good first meal. Heavy or fried foods are harder to digest and may make you feel nauseous or bloated. Drink plenty of fluids but you should avoid alcoholic beverages for 24 hours. If you had a esophageal dilation, please see attached instructions for diet.   ° °ACTIVITY: Your care partner should take you home directly after the procedure. You should plan to take it easy, moving slowly for the rest of the day. You can resume normal activity the day after the procedure however YOU SHOULD NOT DRIVE, use power tools, machinery or perform tasks that involve climbing or major physical exertion for 24 hours (because of the sedation medicines used during the test).  ° °SYMPTOMS TO REPORT IMMEDIATELY: °A gastroenterologist can be reached at any hour. Please call [phone]  for any of the following symptoms:  °• Following lower endoscopy (colonoscopy, flexible sigmoidoscopy) °Excessive amounts of blood in the stool  °Significant tenderness, worsening of abdominal pains  °Swelling of the abdomen that is new, acute  °Fever of 100°  or higher  °• Following upper endoscopy (EGD, EUS, ERCP, esophageal dilation) °Vomiting of blood or coffee ground material  °New, significant abdominal pain  °New, significant chest pain or pain under the shoulder blades  °Painful or persistently difficult swallowing  °New shortness of breath  °Black, tarry-looking or red, bloody stools ° °FOLLOW UP:  °If any biopsies were taken you will be contacted by phone or by letter within the next 1-3 weeks. Call [phone]  if you have not heard about the biopsies in 3 weeks.  °Please also call with any specific questions about appointments or follow up tests.YOU HAD AN ENDOSCOPIC PROCEDURE TODAY: Refer to the procedure report and other information in the discharge instructions given to you for any specific questions about what was found during the examination. If this information does not answer your questions, please call [place] office at [phone] to clarify.  ° °YOU SHOULD EXPECT: Some feelings of bloating in the abdomen. Passage of more gas than usual. Walking can help get rid of the air that was put into your GI tract during the procedure and reduce the bloating. If you had a lower endoscopy (such as a colonoscopy or flexible sigmoidoscopy) you may notice spotting of blood in your stool or on the toilet paper. Some abdominal soreness may be present for a day or two, also. ° °DIET: Your first meal following the procedure should be a light meal and then it is ok to progress to your normal diet. A half-sandwich or bowl of soup is an example   of a good first meal. Heavy or fried foods are harder to digest and may make you feel nauseous or bloated. Drink plenty of fluids but you should avoid alcoholic beverages for 24 hours. If you had a esophageal dilation, please see attached instructions for diet.   ° °ACTIVITY: Your care partner should take you home directly after the procedure. You should plan to take it easy, moving slowly for the rest of the day. You can resume  normal activity the day after the procedure however YOU SHOULD NOT DRIVE, use power tools, machinery or perform tasks that involve climbing or major physical exertion for 24 hours (because of the sedation medicines used during the test).  ° °SYMPTOMS TO REPORT IMMEDIATELY: °A gastroenterologist can be reached at any hour. Please call [phone]  for any of the following symptoms:  °• Following upper endoscopy (EGD, EUS, ERCP, esophageal dilation) °Vomiting of blood or coffee ground material  °New, significant abdominal pain  °New, significant chest pain or pain under the shoulder blades  °Painful or persistently difficult swallowing  °New shortness of breath  °Black, tarry-looking or red, bloody stools ° °FOLLOW UP:  °If any biopsies were taken you will be contacted by phone or by letter within the next 1-3 weeks. Call [phone]  if you have not heard about the biopsies in 3 weeks.  °Please also call with any specific questions about appointments or follow up tests. °

## 2020-05-20 NOTE — Anesthesia Procedure Notes (Signed)
Procedure Name: Intubation Date/Time: 05/20/2020 8:27 AM Performed by: Minerva Ends, CRNA Pre-anesthesia Checklist: Patient identified, Emergency Drugs available, Suction available and Patient being monitored Patient Re-evaluated:Patient Re-evaluated prior to induction Oxygen Delivery Method: Circle System Utilized Preoxygenation: Pre-oxygenation with 100% oxygen Induction Type: IV induction Ventilation: Mask ventilation without difficulty Laryngoscope Size: Miller and 2 Grade View: Grade I Tube type: Oral Tube size: 7.0 mm Number of attempts: 1 Airway Equipment and Method: Stylet and Oral airway Placement Confirmation: ETT inserted through vocal cords under direct vision,  positive ETCO2 and breath sounds checked- equal and bilateral Secured at: 21 cm Tube secured with: Tape Dental Injury: Teeth and Oropharynx as per pre-operative assessment  Comments: Smooth IV induction Foster--intubation AM CRNA atraumatic-- teeth and mouth as preop- bilat BS Malen Gauze

## 2020-05-20 NOTE — Anesthesia Procedure Notes (Signed)
Date/Time: 05/20/2020 9:48 AM Performed by: Minerva Ends, CRNA Oxygen Delivery Method: Simple face mask Placement Confirmation: positive ETCO2 and breath sounds checked- equal and bilateral Dental Injury: Teeth and Oropharynx as per pre-operative assessment

## 2020-05-20 NOTE — Interval H&P Note (Signed)
History and Physical Interval Note:  05/20/2020 8:25 AM  Holly Navarro  has presented today for surgery, with the diagnosis of Pancreatic Cyst. Hx of Pancreatitis.  The various methods of treatment have been discussed with the patient and family. After consideration of risks, benefits and other options for treatment, the patient has consented to  Procedure(s): UPPER ESOPHAGEAL ENDOSCOPIC ULTRASOUND (EUS) + cystgastrostomy (N/A) as a surgical intervention.  The patient's history has been reviewed, patient examined, no change in status, stable for surgery.  I have reviewed the patient's chart and labs.  Questions were answered to the patient's satisfaction.    The risks of an EUS including intestinal perforation, bleeding, infection, aspiration, and medication effects were discussed as was the possibility it may not give a definitive diagnosis if a biopsy is performed.  When a biopsy of the pancreas is done as part of the EUS, there is an additional risk of pancreatitis at the rate of about 1-2%.  It was explained that procedure related pancreatitis is typically mild, although it can be severe and even life threatening, which is why we do not perform random pancreatic biopsies and only biopsy a lesion/area we feel is concerning enough to warrant the risk.  The risks and benefits of endoscopic evaluation were discussed with the patient; these include but are not limited to the risk of perforation, infection, bleeding, missed lesions, lack of diagnosis, severe illness requiring hospitalization, as well as anesthesia and sedation related illnesses.  The patient is agreeable to proceed.    [employer]

## 2020-05-20 NOTE — Transfer of Care (Signed)
Immediate Anesthesia Transfer of Care Note  Patient: Zhana A Brockmann  Procedure(s) Performed: UPPER ESOPHAGEAL ENDOSCOPIC ULTRASOUND (EUS)   (N/A ) BIOPSY ESOPHAGOGASTRODUODENOSCOPY (EGD) (N/A )  Patient Location: PACU and Endoscopy Unit  Anesthesia Type:General  Level of Consciousness: awake and alert   Airway & Oxygen Therapy: Patient Spontanous Breathing  Post-op Assessment: Report given to RN and Post -op Vital signs reviewed and stable  Post vital signs: Reviewed and stable  Last Vitals:  Vitals Value Taken Time  BP 137/82 05/20/20 0952  Temp 36.6 C 05/20/20 0952  Pulse 69 05/20/20 0956  Resp 15 05/20/20 0956  SpO2 98 % 05/20/20 0956  Vitals shown include unvalidated device data.  Last Pain:  Vitals:   05/20/20 0952  TempSrc: Oral  PainSc: 0-No pain         Complications: No complications documented.

## 2020-05-21 ENCOUNTER — Telehealth: Payer: Self-pay

## 2020-05-21 ENCOUNTER — Encounter (HOSPITAL_COMMUNITY): Payer: Self-pay | Admitting: Gastroenterology

## 2020-05-21 ENCOUNTER — Encounter: Payer: Self-pay | Admitting: Gastroenterology

## 2020-05-21 LAB — SURGICAL PATHOLOGY

## 2020-05-21 NOTE — Telephone Encounter (Signed)
Patient is on very low dose of insulin. I ordered the smallest amount 1 pen as requested because 5 pens would be much longer than 90 day supply.  She should have Medicare insurance. I am confused by that. Unless this has changed and I was unaware.  Options for her:  1. She may pick up a Basaglar insulin pen sample from our office for free. We have some in stock. It can last for several weeks hopefully for her still at this low dose of insulin. That will give [place] time to help get some other insulin rx covered for her. Her next visit is with new PCP Nicki Reaper, FNP 06/10/20  2. She may pick up the OTC insulin, Relion Novolin ([facility] brand) the dosing is different however. So I would prefer the first option for now, and then at next visit she can discuss options with Nicki Reaper, FNP  Saralyn Pilar, DO [facility] 05/21/2020, 1:05 PM

## 2020-05-21 NOTE — Telephone Encounter (Signed)
I have spoken with someone at [facility] as well as the patient.   She does not have Medicare Part D which is used for prescriptions, she only uses a good rx card.   The patient said she has a card that allows for the insulin to be $99 instead of $500 from what [facility] said and she plans to get that this coming Monday.  They both agreed to call us back if they have any issues going forward with that.  All is a go for now!

## 2020-05-21 NOTE — Telephone Encounter (Signed)
Courtney with [facility] called in to inform Dr Kirtland Bouchard the patient does not have any insurance and the Rx he wrote for [facility] FLEXTOUCH 100 UNIT/ML FlexTouch Pen is very expensive plus written for one pen the box come with 5 pens andis unbreakable. Says that Relion Novolin are a less expensive medication that  may be more affordable for patient.  Please advise

## 2020-05-27 ENCOUNTER — Other Ambulatory Visit: Payer: Self-pay

## 2020-05-27 ENCOUNTER — Ambulatory Visit
Admission: RE | Admit: 2020-05-27 | Discharge: 2020-05-27 | Disposition: A | Discharge status: Home / Self Care | Payer: Medicare Other | Source: Ambulatory Visit | Attending: Gastroenterology | Admitting: Gastroenterology

## 2020-05-27 DIAGNOSIS — Z8719 Personal history of other diseases of the digestive system: Secondary | ICD-10-CM | POA: Diagnosis present

## 2020-05-27 DIAGNOSIS — K862 Cyst of pancreas: Secondary | ICD-10-CM

## 2020-05-27 MED ORDER — IOHEXOL 300 MG/ML  SOLN
75.0000 mL | Freq: Once | INTRAMUSCULAR | Status: AC | PRN
  Administered 2020-05-27: 75 mL via INTRAVENOUS

## 2020-05-29 ENCOUNTER — Other Ambulatory Visit: Payer: Self-pay

## 2020-05-29 DIAGNOSIS — K8681 Exocrine pancreatic insufficiency: Secondary | ICD-10-CM

## 2020-06-08 ENCOUNTER — Other Ambulatory Visit: Payer: Self-pay

## 2020-06-08 ENCOUNTER — Ambulatory Visit (INDEPENDENT_AMBULATORY_CARE_PROVIDER_SITE_OTHER): Payer: Medicare Other | Admitting: Gastroenterology

## 2020-06-08 ENCOUNTER — Encounter: Payer: Self-pay | Admitting: Gastroenterology

## 2020-06-08 VITALS — BP 128/67 | HR 59 | Temp 98.2°F | Ht 66.0 in | Wt 134.2 lb

## 2020-06-08 DIAGNOSIS — K8681 Exocrine pancreatic insufficiency: Secondary | ICD-10-CM | POA: Diagnosis not present

## 2020-06-08 DIAGNOSIS — K8591 Acute pancreatitis with uninfected necrosis, unspecified: Secondary | ICD-10-CM | POA: Diagnosis not present

## 2020-06-08 DIAGNOSIS — K863 Pseudocyst of pancreas: Secondary | ICD-10-CM

## 2020-06-08 NOTE — Progress Notes (Signed)
  R , MD [address]  [address]  [place], [place] [address]  Main: [phone]  Fax: [phone]    Gastroenterology Consultation  Referring Provider:     Malfi, Nicole M, FNP Primary Care Physician:  Malfi, Nicole M, FNP Primary Gastroenterologist:  Dr.  R  Reason for Consultation: Acute necrotizing pancreatitis with pancreatic pseudocyst and possible walled off necrosis        HPI:   Holly Navarro is a 75 y.o. female referred by Dr. Malfi, Nicole M, FNP  for consultation & management of recent hospitalization for acute necrotizing pancreatitis of unclear etiology.  She does have history of chronic tobacco use.  Patient was hospitalized from 02/29/2020 through 03/14/2020, diagnosed with acute necrotizing pancreatitis without evidence of infection.  She did not have any imaging evidence of cholelithiasis.  There was no evidence of biliary obstruction based on imaging and serology.  She was managed conservatively with n.p.o., bowel rest temporarily, pain management.  She was also empirically on antibiotics due to significant leukocytosis, although there was no clear-cut evidence of infected necrosis of the pancreas.  Her diet has been slowly advanced and she was able to tolerate small frequent meals.  She was discharged on pancreatic enzyme supplementation.  Interval summary Patient is accompanied by her daughter today.  She does have ongoing epigastric pain and abdominal distention, worse postprandial.  Overall, she reports feeling significantly better, energy levels are improving, able to perform mild to moderate activity at home, although she does report fatigue the next day.  Reports having 1-2 soft bowel movements daily.  She takes Creon 72K with each meal and 36K with snack.  Her Creon has been approved via patient assistance program, waiting for shipment.  She reports that her blood sugars have been ranging between 150s and 210s.  Otherwise, patient does not  have any concerns today  Follow-up visit 06/08/2020 Patient is here for follow-up of history of acute necrotizing pancreatitis with pseudocyst of the pancreas and possibly walled off necrosis.  Patient is closely followed by Dr. Mansouraty and most recently EUS guided cyst gastrostomy was attempted on 05/20/2020.  Due to lack of appropriate window, axial stent was not placed.  Patient underwent repeat CT scan which revealed splenic vein occlusion with collaterals, she has been referred to hematology to evaluate for long-term anticoagulation.  Patient states she had regular portion for dinner last night that has resulted in abdominal bloating more than usual.  She has cut back to small portions today.  She is compliant with pancreatic enzymes and she thinks they are helping her to maintain weight.  She does have new onset of diabetes and insulin-dependent, managed by her PCP.  Patient is pleased with her progress, does not have any active concerns today.  She is asking if she can have about 3 times a week.  She did start smoking tobacco 3 to 4 cigarettes daily.  NSAIDs: None  Antiplts/Anticoagulants/Anti thrombotics: None  GI Procedures:  EGD EUS 05/20/2020 Gastric biopsies negative for H. pylori, there is presence of hiatal hernia  Past Medical History:  Diagnosis Date  . Anemia   . Anxiety   . Arthritis   . Cardiac arrhythmia   . Hyperlipidemia   . Hypertension   . Pancreatic pseudocyst   . Seizure ([facility])   . Stomach ulcer     Past Surgical History:  Procedure Laterality Date  . BIOPSY  05/20/2020   Procedure: BIOPSY;  Surgeon: Mansouraty, Gabriel Jr., MD;  Location:   [facility];  Service: Gastroenterology;;  . ESOPHAGOGASTRODUODENOSCOPY N/A 05/20/2020   Procedure: ESOPHAGOGASTRODUODENOSCOPY (EGD);  Surgeon: Irving Copas., MD;  Location: [facility];  Service: Gastroenterology;  Laterality: N/A;  . LAPAROSCOPIC OVARIAN CYSTECTOMY    . TONSILLECTOMY    . UPPER ESOPHAGEAL  ENDOSCOPIC ULTRASOUND (EUS) N/A 05/20/2020   Procedure: UPPER ESOPHAGEAL ENDOSCOPIC ULTRASOUND (EUS)  ;  Surgeon: Irving Copas., MD;  Location: [facility];  Service: Gastroenterology;  Laterality: N/A;    Current Outpatient Medications:  .  atenolol (TENORMIN) 50 MG tablet, Take 1 tablet (50 mg total) by mouth daily., Disp: 90 tablet, Rfl: 1 .  blood glucose meter kit and supplies KIT, Dispense based on patient and insurance preference. Use up to four times daily as directed., Disp: 1 each, Rfl: 0 .  co-enzyme Q-10 30 MG capsule, Take 30 mg by mouth 3 (three) times daily., Disp: , Rfl:  .  ECHINACEA PO, Take 900 mg by mouth 2 (two) times daily., Disp: , Rfl:  .  fluticasone (FLONASE) 50 MCG/ACT nasal spray, Place 2 sprays into both nostrils daily., Disp: 15.8 mL, Rfl: 2 .  Insulin Pen Needle (NOVOFINE PEN NEEDLE) 32G X 6 MM MISC, Use to inject insulin daily as directed., Disp: 90 each, Rfl: 1 .  lipase/protease/amylase (CREON) 36000 UNITS CPEP capsule, Take 2 capsules (72,000 Units total) by mouth 3 (three) times daily with meals. Take 2 capsules (72,000 Units total) by mouth 3 (three) times daily with meals and 36000 with snack (Patient taking differently: Take 72,000 Units by mouth See admin instructions. Take 2 capsules (72,000 Units total) by mouth 3 (three) times daily with meals and 36000 with snack), Disp: 270 capsule, Rfl: 1 .  lisinopril (ZESTRIL) 10 MG tablet, Take 10 mg by mouth daily., Disp: , Rfl:  .  OVER THE COUNTER MEDICATION, Take 1 capsule by mouth 2 (two) times daily. [place] Formula, Disp: , Rfl:  .  pantoprazole (PROTONIX) 40 MG tablet, Take 1 tablet (40 mg total) by mouth at bedtime., Disp: 30 tablet, Rfl: 2 .  Red Yeast Rice 600 MG CAPS, Take 600 mg by mouth 2 (two) times daily., Disp: , Rfl:  .  simethicone (MYLICON) 80 MG chewable tablet, Chew 80 mg by mouth in the morning, at noon, in the evening, and at bedtime., Disp: , Rfl:  .  TRESIBA  FLEXTOUCH 100 UNIT/ML FlexTouch Pen, Inject 7 Units into the skin daily. Adjust dose as advised., Disp: 3 mL, Rfl: 2   Family History  Problem Relation Age of Onset  . Heart disease Mother   . Kidney disease Mother   . Diabetes Father   . Pancreatic cancer Brother        Agent Orange  . Diabetes Paternal Uncle        x 2  . Colon cancer Son 82  . Esophageal cancer Neg Hx   . Inflammatory bowel disease Neg Hx   . Liver disease Neg Hx   . Stomach cancer Neg Hx      Social History   Tobacco Use  . Smoking status: Current Every Day Smoker    Packs/day: 0.50    Types: Cigarettes    Last attempt to quit: 02/29/2020    Years since quitting: 0.2  . Smokeless tobacco: Never Used  Vaping Use  . Vaping Use: Never used  Substance Use Topics  . Alcohol use: Yes    Alcohol/week: 0.0 standard drinks    Comment: occasionally   .  Drug use: No    Allergies as of 06/08/2020  . (No Known Allergies)    Review of Systems:    All systems reviewed and negative except where noted in HPI.   Physical Exam:  BP 128/67 (BP Location: Left Arm, Patient Position: Sitting, Cuff Size: Normal)   Pulse (!) 59   Temp 98.2 F (36.8 C) (Oral)   Ht 5' 6" (1.676 m)   Wt 134 lb 4 oz (60.9 kg)   BMI 21.67 kg/m  No LMP recorded. Patient is postmenopausal.  General:   Alert,  Well-developed, well-nourished, pleasant and cooperative in NAD Head:  Normocephalic and atraumatic. Eyes:  Sclera clear, no icterus.   Conjunctiva pink. Ears:  Normal auditory acuity. Nose:  No deformity, discharge, or lesions. Mouth:  No deformity or lesions,oropharynx pink & moist. Neck:  Supple; no masses or thyromegaly. Lungs:  Respirations even and unlabored.  Clear throughout to auscultation.   No wheezes, crackles, or rhonchi. No acute distress. Heart:  Regular rate and rhythm; no murmurs, clicks, rubs, or gallops. Abdomen:  Normal bowel sounds. Soft, mildly distended, tympanic to percussion, mild epigastric tenderness  without masses, hepatosplenomegaly or hernias noted.  No guarding or rebound tenderness.   Rectal: Not performed Msk:  Symmetrical without gross deformities. Good, equal movement & strength bilaterally. Pulses:  Normal pulses noted. Extremities:  No clubbing or edema.  No cyanosis. Neurologic:  Alert and oriented x3;  grossly normal neurologically. Skin:  Intact without significant lesions or rashes. No jaundice. Psych:  Alert and cooperative. Normal mood and affect.  Imaging Studies: Reviewed  Assessment and Plan:   Holly Navarro is a 75 y.o. pleasant Caucasian female with history of tobacco use, otherwise healthy, who is diagnosed with acute necrotizing pancreatitis without infection, associated with pseudocyst of the pancreas and possible walled off necrosis 12x5cm is here for follow-up.  It is possible that patient may have underlying chronic pancreatitis from chronic tobacco use.  No evidence of cholelithiasis based on imaging.  Serum triglycerides less than 500, has new onset of diabetes secondary to pancreatitis  Continue Creon 36K 2-3 capsules with each meal and 1 capsule with snack Continue small frequent meals, low-fat, high-protein diet Follow-up with Dr. Rush Landmark for further management of pseudocyst of the pancreas and possible walled off necrosis Strongly advised patient not to drink alcohol, avoid smoking Management of diabetes per primary   Follow up as needed   Cephas Darby, MD

## 2020-06-10 ENCOUNTER — Other Ambulatory Visit (INDEPENDENT_AMBULATORY_CARE_PROVIDER_SITE_OTHER): Payer: Medicare Other

## 2020-06-10 ENCOUNTER — Encounter: Payer: Self-pay | Admitting: Gastroenterology

## 2020-06-10 ENCOUNTER — Ambulatory Visit (INDEPENDENT_AMBULATORY_CARE_PROVIDER_SITE_OTHER): Payer: Medicare Other | Admitting: Internal Medicine

## 2020-06-10 ENCOUNTER — Encounter: Payer: Self-pay | Admitting: Internal Medicine

## 2020-06-10 ENCOUNTER — Other Ambulatory Visit: Payer: Self-pay

## 2020-06-10 ENCOUNTER — Ambulatory Visit (INDEPENDENT_AMBULATORY_CARE_PROVIDER_SITE_OTHER): Payer: Medicare Other | Admitting: Gastroenterology

## 2020-06-10 VITALS — BP 118/70 | HR 68 | Ht 65.5 in | Wt 134.2 lb

## 2020-06-10 DIAGNOSIS — F17200 Nicotine dependence, unspecified, uncomplicated: Secondary | ICD-10-CM

## 2020-06-10 DIAGNOSIS — K862 Cyst of pancreas: Secondary | ICD-10-CM

## 2020-06-10 DIAGNOSIS — K8689 Other specified diseases of pancreas: Secondary | ICD-10-CM

## 2020-06-10 DIAGNOSIS — I129 Hypertensive chronic kidney disease with stage 1 through stage 4 chronic kidney disease, or unspecified chronic kidney disease: Secondary | ICD-10-CM

## 2020-06-10 DIAGNOSIS — K219 Gastro-esophageal reflux disease without esophagitis: Secondary | ICD-10-CM

## 2020-06-10 DIAGNOSIS — K863 Pseudocyst of pancreas: Secondary | ICD-10-CM | POA: Diagnosis not present

## 2020-06-10 DIAGNOSIS — R6881 Early satiety: Secondary | ICD-10-CM | POA: Diagnosis not present

## 2020-06-10 DIAGNOSIS — I10 Essential (primary) hypertension: Secondary | ICD-10-CM

## 2020-06-10 DIAGNOSIS — K8591 Acute pancreatitis with uninfected necrosis, unspecified: Secondary | ICD-10-CM | POA: Diagnosis not present

## 2020-06-10 DIAGNOSIS — K8681 Exocrine pancreatic insufficiency: Secondary | ICD-10-CM

## 2020-06-10 DIAGNOSIS — E782 Mixed hyperlipidemia: Secondary | ICD-10-CM

## 2020-06-10 DIAGNOSIS — R569 Unspecified convulsions: Secondary | ICD-10-CM

## 2020-06-10 DIAGNOSIS — R935 Abnormal findings on diagnostic imaging of other abdominal regions, including retroperitoneum: Secondary | ICD-10-CM

## 2020-06-10 DIAGNOSIS — I714 Abdominal aortic aneurysm, without rupture, unspecified: Secondary | ICD-10-CM

## 2020-06-10 DIAGNOSIS — K8501 Idiopathic acute pancreatitis with uninfected necrosis: Secondary | ICD-10-CM

## 2020-06-10 DIAGNOSIS — K861 Other chronic pancreatitis: Secondary | ICD-10-CM | POA: Diagnosis not present

## 2020-06-10 DIAGNOSIS — I82890 Acute embolism and thrombosis of other specified veins: Secondary | ICD-10-CM

## 2020-06-10 DIAGNOSIS — N1831 Chronic kidney disease, stage 3a: Secondary | ICD-10-CM

## 2020-06-10 LAB — COMPREHENSIVE METABOLIC PANEL
ALT: 11 U/L (ref 0–35)
AST: 16 U/L (ref 0–37)
Albumin: 4.1 g/dL (ref 3.5–5.2)
Alkaline Phosphatase: 62 U/L (ref 39–117)
BUN: 23 mg/dL (ref 6–23)
CO2: 28 mEq/L (ref 19–32)
Calcium: 9.9 mg/dL (ref 8.4–10.5)
Chloride: 102 mEq/L (ref 96–112)
Creatinine, Ser: 1.14 mg/dL (ref 0.40–1.20)
GFR: 47.2 mL/min — ABNORMAL LOW (ref >60.00)
Glucose, Bld: 142 mg/dL — ABNORMAL HIGH (ref 70–99)
Potassium: 4.1 mEq/L (ref 3.5–5.1)
Sodium: 136 mEq/L (ref 135–145)
Total Bilirubin: 0.4 mg/dL (ref 0.2–1.2)
Total Protein: 7.4 g/dL (ref 6.0–8.3)

## 2020-06-10 LAB — SEDIMENTATION RATE: Sed Rate: 12 mm/hr (ref 0–30)

## 2020-06-10 LAB — CBC
HCT: 43.8 % (ref 36.0–46.0)
Hemoglobin: 14.5 g/dL (ref 12.0–15.0)
MCHC: 33 g/dL (ref 30.0–36.0)
MCV: 88.1 fl (ref 78.0–100.0)
Platelets: 261 10*3/uL (ref 150.0–400.0)
RBC: 4.98 Mil/uL (ref 3.87–5.11)
RDW: 13.8 % (ref 11.5–15.5)
WBC: 9.8 10*3/uL (ref 4.0–10.5)

## 2020-06-10 LAB — C-REACTIVE PROTEIN: CRP: <1 mg/dL (ref 0.5–20.0)

## 2020-06-10 MED ORDER — ATENOLOL 50 MG PO TABS
50.0000 mg | ORAL_TABLET | Freq: Every day | ORAL | 1 refills | Status: DC
Start: 2020-06-10 — End: 2020-12-11

## 2020-06-10 MED ORDER — PANTOPRAZOLE SODIUM 40 MG PO TBEC: 40.0000 mg | DELAYED_RELEASE_TABLET | Freq: Every day | ORAL | 1 refills | Status: DC

## 2020-06-10 MED ORDER — LISINOPRIL 10 MG PO TABS: 10.0000 mg | ORAL_TABLET | Freq: Every day | ORAL | 1 refills | Status: DC

## 2020-06-10 NOTE — Progress Notes (Signed)
[facility] VISIT   Primary Care Provider Malfi, Lupita Raider, FNP No address on file None   Patient Profile: Holly Navarro is a 75 y.o. 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  The patient presents to the [facility] for an evaluation and management of problem(s) noted below:  Problem List 1. Sterile pancreatic necrosis   2. Early satiety   3. Cyst of pancreas   4. Splenic vein thrombosis   5. Exocrine pancreatic insufficiency   6. Idiopathic acute pancreatitis with uninfected necrosis   7. Abnormal CT of the abdomen   8. Tobacco use disorder     History of Present Illness Please see initial consultation note and prior progress notes for full details of HPI.  Interval History The patient returns for scheduled follow-up.  Since her EGD/EUS, the patient has done very well.  Her weight remains stable.  She is eating mostly things that she would normally eat just trying to be better with not eating large portions.  She has no fevers.  She has not had the significant abdominal discomforts that she has had prior to our endoscopic ultrasound.  CT scan imaging suggest a more conspicuous walled off necrosis with no evidence of infection.  There is some concern for her splenic vein being chronically occluded and some gastric vessel collaterals developing.  Patient denies any changes in her bowel habits.  She is doing what she would normally do on a regular basis and is even feeling better than she did at times before her episode of pancreatitis.  She is tolerating her Creon and feels it has made a difference for her.  GI Review of  Systems Positive as above including very infrequent bloating Negative for dysphagia, odynophagia, change in bowel habits, hematochezia, melena   Review of Systems General: Denies fevers/chills/unintentional weight loss Cardiovascular: Denies chest pain/palpitations Pulmonary: Denies shortness of breath Gastroenterological: See HPI Genitourinary: Denies darkened urine Hematological: Denies easy bruising/bleeding Dermatological: Denies jaundice Psychological: Mood is stable   Medications Current Outpatient Medications  Medication Sig Dispense Refill   atenolol (TENORMIN) 50 MG tablet Take 1 tablet (50 mg total) by mouth daily. 90 tablet 1   blood glucose meter kit and supplies KIT Dispense based on patient and insurance preference. Use up to four times daily as directed. 1 each 0   co-enzyme Q-10 30 MG capsule Take 30 mg by mouth 3 (three) times daily.     ECHINACEA PO Take 900 mg by mouth 2 (two) times daily.     fluticasone (FLONASE) 50 MCG/ACT nasal spray Place 2 sprays into both nostrils daily. 15.8 mL 2   Insulin Pen Needle (NOVOFINE PEN NEEDLE) 32G X 6 MM MISC Use to inject insulin daily as directed. 90 each 1   lipase/protease/amylase (CREON) 36000 UNITS CPEP capsule Take 2 capsules (72,000 Units total) by mouth 3 (three) times daily with meals. Take 2 capsules (72,000 Units total) by mouth 3 (three) times daily with meals and 36000 with snack (Patient taking differently: Take 72,000 Units by mouth See admin instructions. Take 2 capsules (72,000 Units total) by mouth 3 (three) times daily with meals and 36000 with snack) 270 capsule 1   lisinopril (ZESTRIL) 10 MG tablet Take 1 tablet (10 mg total) by mouth daily. [place]  tablet 1   OVER THE COUNTER MEDICATION Take 1 capsule by mouth 2 (two) times daily. [place] Formula     pantoprazole (PROTONIX) 40 MG tablet Take 1 tablet (40 mg total) by mouth at bedtime. 90 tablet 1   Red Yeast Rice 600 MG CAPS Take 600 mg by  mouth 2 (two) times daily.     simethicone (MYLICON) 80 MG chewable tablet Chew 80 mg by mouth in the morning, at noon, in the evening, and at bedtime.     TRESIBA FLEXTOUCH 100 UNIT/ML FlexTouch Pen Inject 7 Units into the skin daily. Adjust dose as advised. (Patient taking differently: Inject 8 Units into the skin daily. Adjust dose as advised.) 3 mL 2   No current facility-administered medications for this visit.    Allergies No Known Allergies  Histories Past Medical History:  Diagnosis Date   Anemia    Anxiety    Arthritis    Cardiac arrhythmia    Hyperlipidemia    Hypertension    Pancreatic pseudocyst    Seizure ([place])    Stomach ulcer    Past Surgical History:  Procedure Laterality Date   BIOPSY  05/20/2020   Procedure: BIOPSY;  Surgeon: Irving Copas., MD;  Location: [facility];  Service: Gastroenterology;;   ESOPHAGOGASTRODUODENOSCOPY N/A 05/20/2020   Procedure: ESOPHAGOGASTRODUODENOSCOPY (EGD);  Surgeon: Irving Copas., MD;  Location: [facility];  Service: Gastroenterology;  Laterality: N/A;   LAPAROSCOPIC OVARIAN CYSTECTOMY     TONSILLECTOMY     UPPER ESOPHAGEAL ENDOSCOPIC ULTRASOUND (EUS) N/A 05/20/2020   Procedure: UPPER ESOPHAGEAL ENDOSCOPIC ULTRASOUND (EUS)  ;  Surgeon: Irving Copas., MD;  Location: [facility];  Service: Gastroenterology;  Laterality: N/A;   Social History   Socioeconomic History   Marital status: Widowed    Spouse name: Not on file   Number of children: 4   Years of education: Not on file   Highest education level: Not on file  Occupational History   Occupation: retired  Tobacco Use   Smoking status: Every Day    Packs/day: 0.25    Pack years: 0.00    Types: Cigarettes    Last attempt to quit: 02/29/2020    Years since quitting: 0.2   Smokeless tobacco: Never  Vaping Use   Vaping Use: Never used  Substance and Sexual Activity   Alcohol use: Not Currently    Alcohol/week: 0.0 standard drinks     Comment: occasionally    Drug use: No   Sexual activity: Not on file  Other Topics Concern   Not on file  Social History Narrative   Not on file   Social Determinants of Health   Financial Resource Strain: Not on file  Food Insecurity: Not on file  Transportation Needs: Not on file  Physical Activity: Not on file  Stress: Not on file  Social Connections: Not on file  Intimate Partner Violence: Not on file   Family History  Problem Relation Age of Onset   Heart disease Mother    Kidney disease Mother    Diabetes Father    Pancreatic cancer Brother        Agent Orange   Diabetes Paternal Uncle        x 2   Colon cancer Son 71   Esophageal cancer Neg Hx    Inflammatory bowel disease Neg Hx    Liver disease Neg Hx    Stomach cancer Neg Hx    I have reviewed her  medical, social, and family history in detail and updated the electronic medical record as necessary.    PHYSICAL EXAMINATION  BP 118/70 (BP Location: Left Arm, Patient Position: Sitting, Cuff Size: Normal)   Pulse 68   Ht 5' 5.5" (1.664 m)   Wt 134 lb 4 oz (60.9 kg)   BMI 22.00 kg/m  Wt Readings from Last 3 Encounters:  06/10/20 134 lb 4 oz (60.9 kg)  06/10/20 132 lb (59.9 kg)  06/08/20 134 lb 4 oz (60.9 kg)  GEN: NAD, appears stated age, nontoxic PSYCH: Cooperative, without pressured speech EYE: Conjunctivae pink, sclerae anicteric ENT: Masked CV: Nontachycardic RESP: No audible wheezing GI: NABS, soft, nontender throughout, no hepatosplenomegaly appreciated  MSK/EXT: No lower extremity edema SKIN: No jaundice NEURO:  Alert & Oriented x 3, no focal deficits   REVIEW OF DATA  I reviewed the following data at the time of this encounter:  GI Procedures and Studies  May 2022 EGD/EUS EGD Impression: - No gross lesions in esophagus. Z-line regular, 38 cm from the incisors. - 2 cm hiatal hernia. - Erythematous mucosa in the stomach. Biopsied. - No gross lesions in the duodenal bulb, in the first  portion of the duodenum and in the second portion of the duodenum. - Normal major papilla. EUS Impression: - Pancreatic parenchymal abnormalities consisting of diffuse echogenicity, hyperechoic strands, hyperechoic foci, lobularity and shadowing foci were noted in the entire pancreas.  This is most consistent with walled off necrosis. Significant fluid attenuation is not noted except in some portions of the body of the pancreas. At 50 cm there is a small window with appropriate cyst-stomach wall thickness for attempt at AXIOS. There is another region at 55 cm where there is a small window for possible AXIOS. AXIOS not placed today since we will likely transition aseptic necrosis to non-sterile necrosis (further discussion below). - The pancreatic duct had a normal endosonographic appearance in the pancreatic head but was difficult to visualize in the rest of the pancreas due to the [facility]. - Hyperechoic material consistent with sludge was visualized endosonographically in the gallbladder but no stones. - There was no sign of significant pathology in the common bile duct and in the common hepatic duct.  Laboratory Studies  Reviewed those in epic and care everywhere  Imaging Studies  May 2022 CT abdomen IMPRESSION: 1. Large area of walled off necrosis at site of previous glandular and peripancreatic necrosis without signs of gas formation but with evidence of splenic venous occlusion and portal venous narrowing. Splenic artery also in close proximity to the above process which is little changed since April 14, 2020. 2. No signs of acute inflammation 3. Developing collateral pathways in the upper abdomen related to splenic venous occlusion. Attention on follow-up to portal venous structures is suggested. Appearance is little changed since April MRI. 4. Pulmonary emphysema. 5. Mild aneurysmal dilation of the infrarenal abdominal aorta maximal caliber of 3.1 cm. Recommend follow-up every 3 years. This  recommendation follows ACR consensus guidelines: White Paper of the ACR Incidental Findings Committee II on Vascular Findings. J Am Coll Radiol 2013; 10:789-794. 6. Emphysema and aortic atherosclerosis.   ASSESSMENT  Ms. Mahurin is a 75 y.o. 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  The patient is seen today for evaluation and  management of:  1. Sterile pancreatic necrosis   2. Early satiety   3. Cyst of pancreas   4. Splenic vein thrombosis   5. Exocrine pancreatic insufficiency   6. Idiopathic acute pancreatitis with uninfected necrosis   7. Abnormal CT of the abdomen   8. Tobacco use disorder    It is quite remarkable how stable the patient is clinically and hemodynamically.  Her CT scan continues to show evidence of walled off necrosis that is sterile.  She is not having the amount of pain or discomfort that she was having previously before we got her scheduled for her endoscopic ultrasound.  She does have some early satiety symptoms however her weight remains stable.  She is looking well.  I have discussed the with the patient that there still remains a chance we will need to intervene on this as a result of her splenic vein thrombosis leading to gastric collaterals which could lead to gastric varices at some point in time.  With this being said she is doing very well.  I appreciate her being evaluated by hematology to consider whether there is any role for anticoagulation though I suspect we would probably hold on that.  She will move forward with the hematology referral.  We are going to plan to repeat her CT scan in 1 month and as long as there is no progressive changes or the  patient develops significant symptoms we will plan to continue to monitor this.  I offered her a second opinion at one of the quaternary centers in regards to further evaluation or treatment of this large sterile necrosis but she defers on this.  We will see how she does moving forward.  All patient questions were answered to the best of my ability, and the patient agrees to the aforementioned plan of action with follow-up as indicated.   PLAN  Inflammatory markers and LFTs as outlined below Continue to try to do 2-3 protein shakes daily in effort of trying to optimize calories and nutrition More frequent small meals Diabetes control as per primary care Continue pancreatic enzymes CT abdomen in 4 weeks EUS with cyst gastrostomy attempt if patient significantly decompensates or progressive cyst enlargement   Orders Placed This Encounter  Procedures   CT Abdomen Pelvis W Contrast   CT ABDOMEN W WO CONTRAST   CBC   Comp Met (CMET)   Sedimentation rate   C-reactive protein    New Prescriptions   No medications on file   Modified Medications   No medications on file    Planned Follow Up No follow-ups on file.   Total Time in Face-to-Face and in Coordination of Care for patient including independent/personal interpretation/review of prior testing, medical history, examination, medication adjustment, communicating results with the patient directly, and documentation with the EHR is 30 minutes.   Justice Britain, MD [facility] Office # [phone]

## 2020-06-10 NOTE — Patient Instructions (Signed)
You have been scheduled for a CT scan of the abdomen and pelvis at [facility] through medical mall entrance. You are scheduled on 07/01/20  at 10:00am. You should arrive 15 minutes prior to your appointment time for registration.  The solution may taste better if refrigerated, but do NOT add ice or any other liquid to this solution. Shake well before drinking.   Please follow the written instructions below on the day of your exam:   1) Do not eat anything after 6:00am (4 hours prior to your test)   2) Drink 1 bottle of contrast @ 8:00am (2 hours prior to your exam)  Remember to shake well before drinking and do NOT pour over ice.     Drink 1 bottle of contrast @ 9:00am (1 hour prior to your exam)   You may take any medications as prescribed with a small amount of water, if necessary. If you take any of the following medications: METFORMIN, GLUCOPHAGE, GLUCOVANCE, AVANDAMET, RIOMET, FORTAMET, [place] MET, JANUMET, GLUMETZA or METAGLIP, you MAY be asked to HOLD this medication 48 hours AFTER the exam.   The purpose of you drinking the oral contrast is to aid in the visualization of your intestinal tract. The contrast solution may cause some diarrhea. Depending on your individual set of symptoms, you may also receive an intravenous injection of x-ray contrast/dye. Plan on being at [facility] for 45 minutes or longer, depending on the type of exam you are having performed.   If you have any questions regarding your exam or if you need to reschedule, you may call [facility] at [phone] between the hours of 8:00 am and 5:00 pm, Monday-Friday.    Your provider has requested that you go to the basement level for lab work before leaving today. Press "B" on the elevator. The lab is located at the first door on the left as you exit the elevator.  Due to recent changes in healthcare laws, you may see the results of your imaging and laboratory studies on MyChart before your provider has had a  chance to review them.  We understand that in some cases there may be results that are confusing or concerning to you. Not all laboratory results come back in the same time frame and the provider may be waiting for multiple results in order to interpret others.  Please give [place] 48 hours in order for your provider to thoroughly review all the results before contacting the office for clarification of your results.   If you are age 75 or older, your body mass index should be between 23-30. Your Body mass index is 22 kg/m. If this is out of the aforementioned range listed, please consider follow up with your Primary Care Provider.  The [facility] providers would like to encourage you to use [facility] to communicate with providers for non-urgent requests or questions.  Due to long hold times on the telephone, sending your provider a message by [facility] may be a faster and more efficient way to get a response.  Please allow 48 business hours for a response.  Please remember that this is for non-urgent requests.   Thank you for choosing me and [facility].  Dr. Rush Landmark

## 2020-06-12 ENCOUNTER — Encounter: Payer: Self-pay | Admitting: Gastroenterology

## 2020-06-12 ENCOUNTER — Encounter: Payer: Self-pay | Admitting: Internal Medicine

## 2020-06-12 DIAGNOSIS — F17200 Nicotine dependence, unspecified, uncomplicated: Secondary | ICD-10-CM | POA: Insufficient documentation

## 2020-06-12 DIAGNOSIS — K861 Other chronic pancreatitis: Secondary | ICD-10-CM | POA: Insufficient documentation

## 2020-06-12 DIAGNOSIS — R935 Abnormal findings on diagnostic imaging of other abdominal regions, including retroperitoneum: Secondary | ICD-10-CM | POA: Insufficient documentation

## 2020-06-12 DIAGNOSIS — I82890 Acute embolism and thrombosis of other specified veins: Secondary | ICD-10-CM | POA: Insufficient documentation

## 2020-06-12 DIAGNOSIS — K863 Pseudocyst of pancreas: Secondary | ICD-10-CM | POA: Insufficient documentation

## 2020-06-12 NOTE — Assessment & Plan Note (Signed)
We will monitor yearly

## 2020-06-12 NOTE — Assessment & Plan Note (Signed)
Managed with Pantoprazole,Tresbia and Creon She follows with GI

## 2020-06-12 NOTE — Progress Notes (Signed)
Subjective:    Patient ID: Holly Navarro, female    DOB: Apr 26, 1945, 75 y.o.   MRN: [id number]  HPI  Patient presents the clinic today for follow-up of chronic conditions.  She is establishing care with me today, transferring care from Lonie Peak, NP.  AAA without Rupture: CT abdomen from 05/2020 reviewed.  Chronic Pancreatitis with Pancreatic Cyst: She is taking Pantoprazole, Tresbia and Creon as prescribed.  She follows with GI.  CKD: Her last creatinine was 1.10, GFR 49.29, 05/2020.  She is on Lisinopril for renal protection.  She does not follow with nephrology.  HTN: Her BP today is 120/59.  She is taking Lisinopril and Atenolol as prescribed.  ECG from 02/2020 reviewed.  HLD: Her last LDL was 126, triglycerides 281, 12/2019.  She is taking Red Yeast Rice OTC.  She tries to consume a low-fat diet.  History of Seizures: Remote history, medication induced.  No recurrence.  She does not follow with neurology.  Review of Systems  Past Medical History:  Diagnosis Date   Anemia    Anxiety    Arthritis    Cardiac arrhythmia    Hyperlipidemia    Hypertension    Pancreatic pseudocyst    Seizure (HCC)    Stomach ulcer     Current Outpatient Medications  Medication Sig Dispense Refill   blood glucose meter kit and supplies KIT Dispense based on patient and insurance preference. Use up to four times daily as directed. 1 each 0   co-enzyme Q-10 30 MG capsule Take 30 mg by mouth 3 (three) times daily.     ECHINACEA PO Take 900 mg by mouth 2 (two) times daily.     fluticasone (FLONASE) 50 MCG/ACT nasal spray Place 2 sprays into both nostrils daily. 15.8 mL 2   Insulin Pen Needle (NOVOFINE PEN NEEDLE) 32G X 6 MM MISC Use to inject insulin daily as directed. 90 each 1   lipase/protease/amylase (CREON) 36000 UNITS CPEP capsule Take 2 capsules (72,000 Units total) by mouth 3 (three) times daily with meals. Take 2 capsules (72,000 Units total) by mouth 3 (three) times daily with meals  and 36000 with snack (Patient taking differently: Take 72,000 Units by mouth See admin instructions. Take 2 capsules (72,000 Units total) by mouth 3 (three) times daily with meals and 36000 with snack) 270 capsule 1   OVER THE COUNTER MEDICATION Take 1 capsule by mouth 2 (two) times daily. Holiday Formula     Red Yeast Rice 600 MG CAPS Take 600 mg by mouth 2 (two) times daily.     simethicone (MYLICON) 80 MG chewable tablet Chew 80 mg by mouth in the morning, at noon, in the evening, and at bedtime.     TRESIBA FLEXTOUCH 100 UNIT/ML FlexTouch Pen Inject 7 Units into the skin daily. Adjust dose as advised. (Patient taking differently: Inject 8 Units into the skin daily. Adjust dose as advised.) 3 mL 2   atenolol (TENORMIN) 50 MG tablet Take 1 tablet (50 mg total) by mouth daily. 90 tablet 1   lisinopril (ZESTRIL) 10 MG tablet Take 1 tablet (10 mg total) by mouth daily. 90 tablet 1   pantoprazole (PROTONIX) 40 MG tablet Take 1 tablet (40 mg total) by mouth at bedtime. 90 tablet 1   No current facility-administered medications for this visit.    No Known Allergies  Family History  Problem Relation Age of Onset   Heart disease Mother    Kidney disease  Mother    Diabetes Father    Pancreatic cancer Brother        Agent Orange   Diabetes Paternal Uncle        x 2   Colon cancer Son 67   Esophageal cancer Neg Hx    Inflammatory bowel disease Neg Hx    Liver disease Neg Hx    Stomach cancer Neg Hx     Social History   Socioeconomic History   Marital status: Widowed    Spouse name: Not on file   Number of children: 4   Years of education: Not on file   Highest education level: Not on file  Occupational History   Occupation: retired  Tobacco Use   Smoking status: Every Day    Packs/day: 0.25    Pack years: 0.00    Types: Cigarettes    Last attempt to quit: 02/29/2020    Years since quitting: 0.2   Smokeless tobacco: Never  Vaping Use   Vaping Use: Never  used  Substance and Sexual Activity   Alcohol use: Not Currently    Alcohol/week: 0.0 standard drinks    Comment: occasionally    Drug use: No   Sexual activity: Not on file  Other Topics Concern   Not on file  Social History Narrative   Not on file   Social Determinants of Health   Financial Resource Strain: Not on file  Food Insecurity: Not on file  Transportation Needs: Not on file  Physical Activity: Not on file  Stress: Not on file  Social Connections: Not on file  Intimate Partner Violence: Not on file     Constitutional: Denies fever, malaise, fatigue, headache or abrupt weight changes.  HEENT: Denies eye pain, eye redness, ear pain, ringing in the ears, wax buildup, runny nose, nasal congestion, bloody nose, or sore throat. Respiratory: Denies difficulty breathing, shortness of breath, cough or sputum production.   Cardiovascular: Denies chest pain, chest tightness, palpitations or swelling in the hands or feet.  Gastrointestinal: Patient reports intermittent abdominal pain.  Denies bloating, constipation, diarrhea or blood in the stool.  GU: Denies urgency, frequency, pain with urination, burning sensation, blood in urine, odor or discharge. Musculoskeletal: Denies decrease in range of motion, difficulty with gait, muscle pain or joint pain and swelling.  Skin: Denies redness, rashes, lesions or ulcercations.  Neurological: Denies dizziness, difficulty with memory, difficulty with speech or problems with balance and coordination.  Psych: Denies anxiety, depression, SI/HI.  No other specific complaints in a complete review of systems (except as listed in HPI above).     Objective:   Physical Exam   BP (!) 120/59 (BP Location: Right Arm, Patient Position: Sitting, Cuff Size: Normal)   Pulse (!) 58   Temp (!) 97.3 F (36.3 C) (Temporal)   Resp 17   Ht _0  (1.676 m)   Wt 132 lb (59.9 kg)   SpO2 100%   BMI 21.31 kg/m  Wt Readings from Last 3 Encounters:   06/10/20 134 lb 4 oz (60.9 kg)  06/10/20 132 lb (59.9 kg)  06/08/20 134 lb 4 oz (60.9 kg)    General: Appears her stated age, well developed, well nourished in NAD. Skin: Warm, dry and intact.  HEENT: Head: normal shape and size; Eyes: sclera whit and EOMs intact; Cardiovascular: Bradycardic with rhythm. S1,S2 noted.  No murmur, rubs or gallops noted. No JVD or BLE edema. No carotid bruits noted. Pulmonary/Chest: Normal effort and positive vesicular breath sounds. No  respiratory distress. No wheezes, rales or ronchi noted.  Abdomen: Soft and nontender. Normal bowel sounds. No distention or masses noted.  Musculoskeletal: No difficulty with gait.  Neurological: Alert and oriented.    BMET    Component Value Date/Time   NA 136 06/10/2020 1545   NA 137 03/30/2020 1406   K 4.1 06/10/2020 1545   CL 102 06/10/2020 1545   CO2 28 06/10/2020 1545   GLUCOSE 142 (H) 06/10/2020 1545   BUN 23 06/10/2020 1545   BUN 12 03/30/2020 1406   CREATININE 1.14 06/10/2020 1545   CREATININE 1.11 (H) 12/11/2019 1142   CALCIUM 9.9 06/10/2020 1545   GFRNONAA >60 03/12/2020 0535   GFRNONAA 49 (L) 12/11/2019 1142   GFRAA 57 (L) 12/11/2019 1142    Lipid Panel     Component Value Date/Time   CHOL 208 (H) 12/11/2019 1142   CHOL 262 (H) 10/22/2014 1304   TRIG 169 (H) 02/29/2020 0956   HDL 39 (L) 12/11/2019 1142   HDL 43 10/22/2014 1304   CHOLHDL 5.3 (H) 12/11/2019 1142   LDLCALC 126 (H) 12/11/2019 1142    CBC    Component Value Date/Time   WBC 9.8 06/10/2020 1545   RBC 4.98 06/10/2020 1545   HGB 14.5 06/10/2020 1545   HGB 12.8 03/30/2020 1406   HCT 43.8 06/10/2020 1545   HCT 40.0 03/30/2020 1406   PLT 261.0 06/10/2020 1545   PLT 366 03/30/2020 1406   MCV 88.1 06/10/2020 1545   MCV 92 03/30/2020 1406   MCH 29.5 03/30/2020 1406   MCH 30.6 03/13/2020 0828   MCHC 33.0 06/10/2020 1545   RDW 13.8 06/10/2020 1545   RDW 12.2 03/30/2020 1406   LYMPHSABS 1.5 03/05/2020 0749   LYMPHSABS 3.7 (H)  10/22/2014 1304   MONOABS 1.8 (H) 03/05/2020 0749   EOSABS 0.1 03/05/2020 0749   EOSABS 0.2 10/22/2014 1304   BASOSABS 0.1 03/05/2020 0749   BASOSABS 0.1 10/22/2014 1304    Hgb A1C Lab Results  Component Value Date   HGBA1C 6.6 (H) 03/30/2020           Assessment & Plan:

## 2020-06-12 NOTE — Assessment & Plan Note (Signed)
Lipid profile today Encouraged her to consume a low-fat diet Continue red yeast rice OTC

## 2020-06-12 NOTE — Patient Instructions (Signed)
Heart-Healthy Eating Plan Heart-healthy meal planning includes: Eating less unhealthy fats. Eating more healthy fats. Making other changes in your diet. Talk with your doctor or a diet specialist (dietitian) to create an eating plan that is right for you. What is my plan? Your doctor may recommend an eating plan that includes: Total fat: ______% or less of total calories a day. Saturated fat: ______% or less of total calories a day. Cholesterol: less than _________mg a day. What are tips for following this plan? Cooking Avoid frying your food. Try to bake, boil, grill, or broil it instead. You can also reduce fat by: Removing the skin from poultry. Removing all visible fats from meats. Steaming vegetables in water or broth. Meal planning  At meals, divide your plate into four equal parts: Fill one-half of your plate with vegetables and green salads. Fill one-fourth of your plate with whole grains. Fill one-fourth of your plate with lean protein foods. Eat 4-5 servings of vegetables per day. A serving of vegetables is: 1 cup of raw or cooked vegetables. 2 cups of raw leafy greens. Eat 4-5 servings of fruit per day. A serving of fruit is: 1 medium whole fruit.  cup of dried fruit.  cup of fresh, frozen, or canned fruit.  cup of 100% fruit juice. Eat more foods that have soluble fiber. These are apples, broccoli, carrots, beans, peas, and barley. Try to get 20-30 g of fiber per day. Eat 4-5 servings of nuts, legumes, and seeds per week: 1 serving of dried beans or legumes equals  cup after being cooked. 1 serving of nuts is  cup. 1 serving of seeds equals 1 tablespoon.  General information Eat more home-cooked food. Eat less restaurant, buffet, and fast food. Limit or avoid alcohol. Limit foods that are high in starch and sugar. Avoid fried foods. Lose weight if you are overweight. Keep track of how much salt (sodium) you eat. This is important if you have high blood  pressure. Ask your doctor to tell you more about this. Try to add vegetarian meals each week. Fats Choose healthy fats. These include olive oil and canola oil, flaxseeds, walnuts, almonds, and seeds. Eat more omega-3 fats. These include salmon, mackerel, sardines, tuna, flaxseed oil, and ground flaxseeds. Try to eat fish at least 2 times each week. Check food labels. Avoid foods with trans fats or high amounts of saturated fat. Limit saturated fats. These are often found in animal products, such as meats, butter, and cream. These are also found in plant foods, such as palm oil, palm kernel oil, and coconut oil. Avoid foods with partially hydrogenated oils in them. These have trans fats. Examples are stick margarine, some tub margarines, cookies, crackers, and other baked goods. What foods can I eat? Fruits All fresh, canned (in natural juice), or frozen fruits. Vegetables Fresh or frozen vegetables (raw, steamed, roasted, or grilled). Green salads. Grains Most grains. Choose whole wheat and whole grains most of the time. Rice andpasta, including brown rice and pastas made with whole wheat. Meats and other proteins Lean, well-trimmed beef, veal, pork, and lamb. Chicken and turkey without skin. All fish and shellfish. Wild duck, rabbit, pheasant, and venison. Egg whites or low-cholesterol egg substitutes. Dried beans, peas, lentils, and tofu. Seedsand most nuts. Dairy Low-fat or nonfat cheeses, including ricotta and mozzarella. Skim or 1% milk that is liquid, powdered, or evaporated. Buttermilk that is made with low-fatmilk. Nonfat or low-fat yogurt. Fats and oils Non-hydrogenated (trans-free) margarines. Vegetable oils, including soybean, sesame,   sunflower, olive, peanut, safflower, corn, canola, and cottonseed. Salad dressings or mayonnaisemade with a vegetable oil. Beverages Mineral water. Coffee and tea. Diet carbonated beverages. Sweets and desserts Sherbet, gelatin, and fruit ice. Small  amounts of dark chocolate. Limit all sweets and desserts. Seasonings and condiments All seasonings and condiments. The items listed above may not be a complete list of foods and drinks you can eat. Contact a dietitian for more options. What foods should I avoid? Fruits Canned fruit in heavy syrup. Fruit in cream or butter sauce. Fried fruit. Limitcoconut. Vegetables Vegetables cooked in cheese, cream, or butter sauce. Fried vegetables. Grains Breads that are made with saturated or trans fats, oils, or whole milk. Croissants. Sweet rolls. Donuts. High-fat crackers,such as cheese crackers. Meats and other proteins Fatty meats, such as hot dogs, ribs, sausage, bacon, rib-eye roast or steak. High-fat deli meats, such as salami and bologna. Caviar. Domestic duck andgoose. Organ meats, such as liver. Dairy Cream, sour cream, cream cheese, and creamed cottage cheese. Whole-milk cheeses. Whole or 2% milk that is liquid, evaporated, or condensed. Whole buttermilk. Cream sauce or high-fat cheese sauce. Yogurt that is made fromwhole milk. Fats and oils Meat fat, or shortening. Cocoa butter, hydrogenated oils, palm oil, coconut oil, palm kernel oil. Solid fats and shortenings, including bacon fat, salt pork, lard, and butter. Nondairy cream substitutes. Salad dressings with cheeseor sour cream. Beverages Regular sodas and juice drinks with added sugar. Sweets and desserts Frosting. Pudding. Cookies. Cakes. Pies. Milk chocolate or white chocolate.Buttered syrups. Full-fat ice cream or ice cream drinks. The items listed above may not be a complete list of foods and drinks to avoid. Contact a dietitian for more information. Summary Heart-healthy meal planning includes eating less unhealthy fats, eating more healthy fats, and making other changes in your diet. Eat a balanced diet. This includes fruits and vegetables, low-fat or nonfat dairy, lean protein, nuts and legumes, whole grains, and heart-healthy  oils and fats. This information is not intended to replace advice given to you by your health care provider. Make sure you discuss any questions you have with your healthcare provider. Document Revised: 02/23/2017 Document Reviewed: 01/27/2017 Elsevier Patient Education  2022 Elsevier Inc.  

## 2020-06-12 NOTE — Assessment & Plan Note (Signed)
Monitored by GI, will follow

## 2020-06-12 NOTE — Assessment & Plan Note (Signed)
Controlled on Lisinopril and Atenolol Reinforced DASH diet C-Met today

## 2020-06-12 NOTE — Assessment & Plan Note (Signed)
C-Met today Continue Lisinopril for renal protection Consider referral to nephrology if worsens

## 2020-06-12 NOTE — Assessment & Plan Note (Signed)
Remote history, no recurrence Will monitor

## 2020-06-18 ENCOUNTER — Telehealth: Payer: Self-pay

## 2020-06-18 ENCOUNTER — Encounter: Payer: Self-pay | Admitting: Gastroenterology

## 2020-06-18 ENCOUNTER — Observation Stay
Admission: EM | Admit: 2020-06-18 | Discharge: 2020-06-19 | Disposition: A | Discharge status: Home / Self Care | Payer: Medicare Other | Attending: Internal Medicine | Admitting: Internal Medicine

## 2020-06-18 ENCOUNTER — Other Ambulatory Visit: Payer: Self-pay

## 2020-06-18 ENCOUNTER — Emergency Department: Payer: Medicare Other

## 2020-06-18 DIAGNOSIS — K861 Other chronic pancreatitis: Secondary | ICD-10-CM | POA: Insufficient documentation

## 2020-06-18 DIAGNOSIS — F17210 Nicotine dependence, cigarettes, uncomplicated: Secondary | ICD-10-CM | POA: Insufficient documentation

## 2020-06-18 DIAGNOSIS — Z794 Long term (current) use of insulin: Secondary | ICD-10-CM | POA: Diagnosis not present

## 2020-06-18 DIAGNOSIS — I714 Abdominal aortic aneurysm, without rupture, unspecified: Secondary | ICD-10-CM | POA: Diagnosis present

## 2020-06-18 DIAGNOSIS — Z20822 Contact with and (suspected) exposure to covid-19: Secondary | ICD-10-CM | POA: Diagnosis not present

## 2020-06-18 DIAGNOSIS — Z79899 Other long term (current) drug therapy: Secondary | ICD-10-CM | POA: Insufficient documentation

## 2020-06-18 DIAGNOSIS — K859 Acute pancreatitis without necrosis or infection, unspecified: Secondary | ICD-10-CM | POA: Diagnosis not present

## 2020-06-18 DIAGNOSIS — R109 Unspecified abdominal pain: Secondary | ICD-10-CM | POA: Diagnosis present

## 2020-06-18 DIAGNOSIS — I10 Essential (primary) hypertension: Secondary | ICD-10-CM | POA: Diagnosis present

## 2020-06-18 DIAGNOSIS — I129 Hypertensive chronic kidney disease with stage 1 through stage 4 chronic kidney disease, or unspecified chronic kidney disease: Secondary | ICD-10-CM | POA: Diagnosis not present

## 2020-06-18 DIAGNOSIS — N183 Chronic kidney disease, stage 3 unspecified: Secondary | ICD-10-CM | POA: Insufficient documentation

## 2020-06-18 DIAGNOSIS — R1013 Epigastric pain: Secondary | ICD-10-CM | POA: Diagnosis present

## 2020-06-18 DIAGNOSIS — K8501 Idiopathic acute pancreatitis with uninfected necrosis: Principal | ICD-10-CM | POA: Insufficient documentation

## 2020-06-18 DIAGNOSIS — E119 Type 2 diabetes mellitus without complications: Secondary | ICD-10-CM

## 2020-06-18 DIAGNOSIS — R1033 Periumbilical pain: Secondary | ICD-10-CM

## 2020-06-18 LAB — CBC
HCT: 45.8 % (ref 36.0–46.0)
Hemoglobin: 15.1 g/dL — ABNORMAL HIGH (ref 12.0–15.0)
MCH: 29.4 pg (ref 26.0–34.0)
MCHC: 33 g/dL (ref 30.0–36.0)
MCV: 89.1 fL (ref 80.0–100.0)
Platelets: 228 10*3/uL (ref 150–400)
RBC: 5.14 MIL/uL — ABNORMAL HIGH (ref 3.87–5.11)
RDW: 13.2 % (ref 11.5–15.5)
WBC: 9.1 10*3/uL (ref 4.0–10.5)
nRBC: 0 % (ref 0.0–0.2)

## 2020-06-18 LAB — GLUCOSE, CAPILLARY
Glucose-Capillary: 101 mg/dL — ABNORMAL HIGH (ref 70–99)
Glucose-Capillary: 122 mg/dL — ABNORMAL HIGH (ref 70–99)

## 2020-06-18 LAB — COMPREHENSIVE METABOLIC PANEL
ALT: 14 U/L (ref 0–44)
AST: 20 U/L (ref 15–41)
Albumin: 4 g/dL (ref 3.5–5.0)
Alkaline Phosphatase: 54 U/L (ref 38–126)
Anion gap: 9 (ref 5–15)
BUN: 15 mg/dL (ref 8–23)
CO2: 25 mmol/L (ref 22–32)
Calcium: 9.9 mg/dL (ref 8.9–10.3)
Chloride: 102 mmol/L (ref 98–111)
Creatinine, Ser: 1.11 mg/dL — ABNORMAL HIGH (ref 0.44–1.00)
GFR, Estimated: 52 mL/min — ABNORMAL LOW (ref >60)
Glucose, Bld: 146 mg/dL — ABNORMAL HIGH (ref 70–99)
Potassium: 4.4 mmol/L (ref 3.5–5.1)
Sodium: 136 mmol/L (ref 135–145)
Total Bilirubin: 0.7 mg/dL (ref 0.3–1.2)
Total Protein: 7.5 g/dL (ref 6.5–8.1)

## 2020-06-18 LAB — LIPASE, BLOOD: Lipase: 47 U/L (ref 11–51)

## 2020-06-18 MED ORDER — ONDANSETRON HCL 4 MG PO TABS: 4.0000 mg | ORAL_TABLET | Freq: Four times a day (QID) | ORAL | Status: DC | PRN

## 2020-06-18 MED ORDER — ONDANSETRON HCL 4 MG/2ML IJ SOLN: 4.0000 mg | Freq: Four times a day (QID) | INTRAMUSCULAR | Status: DC | PRN

## 2020-06-18 MED ORDER — FENTANYL CITRATE (PF) 100 MCG/2ML IJ SOLN
50.0000 ug | Freq: Once | INTRAMUSCULAR | Status: DC
  Filled 2020-06-18: qty 2

## 2020-06-18 MED ORDER — IOHEXOL 300 MG/ML  SOLN
100.0000 mL | Freq: Once | INTRAMUSCULAR | Status: AC | PRN
  Administered 2020-06-18: 100 mL via INTRAVENOUS

## 2020-06-18 MED ORDER — PANTOPRAZOLE SODIUM 40 MG IV SOLR
40.0000 mg | INTRAVENOUS | Status: DC
  Administered 2020-06-18: 40 mg via INTRAVENOUS
  Filled 2020-06-18: qty 40

## 2020-06-18 MED ORDER — SODIUM CHLORIDE 0.9 % IV SOLN: INTRAVENOUS | Status: DC

## 2020-06-18 MED ORDER — HYDROMORPHONE HCL 1 MG/ML IJ SOLN
0.5000 mg | INTRAMUSCULAR | Status: DC | PRN
  Administered 2020-06-19: 0.5 mg via INTRAVENOUS
  Filled 2020-06-18: qty 0.5

## 2020-06-18 MED ORDER — ATENOLOL 25 MG PO TABS
50.0000 mg | ORAL_TABLET | Freq: Every day | ORAL | Status: DC
  Administered 2020-06-19: 50 mg via ORAL
  Filled 2020-06-18: qty 2

## 2020-06-18 MED ORDER — LACTATED RINGERS IV BOLUS
1000.0000 mL | Freq: Once | INTRAVENOUS | Status: AC
  Administered 2020-06-18: 1000 mL via INTRAVENOUS

## 2020-06-18 MED ORDER — HYDROMORPHONE HCL 1 MG/ML IJ SOLN
0.5000 mg | Freq: Once | INTRAMUSCULAR | Status: AC
  Administered 2020-06-18: 0.5 mg via INTRAVENOUS
  Filled 2020-06-18: qty 1

## 2020-06-18 MED ORDER — ENOXAPARIN SODIUM 40 MG/0.4ML IJ SOSY
40.0000 mg | PREFILLED_SYRINGE | INTRAMUSCULAR | Status: DC
  Administered 2020-06-18: 40 mg via SUBCUTANEOUS
  Filled 2020-06-18: qty 0.4

## 2020-06-18 MED ORDER — INSULIN ASPART 100 UNIT/ML IJ SOLN
0.0000 [IU] | INTRAMUSCULAR | Status: DC
  Administered 2020-06-19: 2 [IU] via SUBCUTANEOUS
  Filled 2020-06-18: qty 1

## 2020-06-18 MED ORDER — LISINOPRIL 10 MG PO TABS
10.0000 mg | ORAL_TABLET | Freq: Every day | ORAL | Status: DC
  Administered 2020-06-19: 10 mg via ORAL
  Filled 2020-06-18: qty 1

## 2020-06-18 NOTE — Telephone Encounter (Signed)
Dr Meridee Score see the message from the pt;   "I am expierencing pains that I didn't have before. Starts in the front right below the sternum and goes around to my back on the entire left side. I am burping a lot.  This started when I woke up, at 9:30, went to the bathroom and got back into bed"

## 2020-06-18 NOTE — ED Notes (Signed)
Patient transported to CT 

## 2020-06-18 NOTE — ED Notes (Signed)
Patient reports necrotizing pancreatitis. Patient reports cyst on her pancreas in February, but reports the cyst drained without intervention, which resulted in "wait and see" per the surgeon. Patient reports pain to the right and left upper quadrant, that radiates into the back. Patient is alert, oriented x4. Patient denies vomiting or diarrhea.

## 2020-06-18 NOTE — ED Triage Notes (Signed)
Pt comes into the ED via EMS from home with c/o LUQ pain with hx of necrotizing pancreatitis .Holly Navarro  Denies N/V.. new pain that is radiating into her back.  177/66 150CBG 61HR 98%RA

## 2020-06-18 NOTE — ED Notes (Signed)
Patient requests to not be given Morphine. Provider aware.

## 2020-06-18 NOTE — ED Notes (Signed)
Transport requested

## 2020-06-18 NOTE — Telephone Encounter (Signed)
Thank you for update.  We will see what happens in follow-up.

## 2020-06-18 NOTE — Telephone Encounter (Signed)
6/16 sent to [facility]

## 2020-06-18 NOTE — Telephone Encounter (Signed)
Per pt she developed pain and swelling and is the ER at [facility].

## 2020-06-18 NOTE — ED Notes (Signed)
Patient provided urine specimen cup and notified of urine specimen ordered. Patient verbalized understanding.

## 2020-06-18 NOTE — ED Notes (Signed)
Patient and family updated on POC

## 2020-06-18 NOTE — ED Provider Notes (Signed)
[facility] Emergency Department Provider Note ____________________________________________   Event Date/Time   First MD Initiated Contact with Patient 06/18/20 1610     (approximate)  I have reviewed the triage vital signs and the nursing notes.  HISTORY  Chief Complaint Abdominal Pain   HPI Holly BAUMGARTEN is a 75 y.o. femalewho presents to the ED for evaluation of abd pain.   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  Follows closely with GI as an outpatient, last seen 8 days ago.  Patient presents to the ED for evaluation of acute epigastric abdominal pain over the past 8 hours or so.  She reports doing well until this morning, she developed abdominal pain a couple hours after waking up, sharp in nature and typical for her pancreatic pain.  She reports 6/10 intensity sharp pain, radiating around her left flank, which is a novel feature.  Denies any coexisting symptoms with the pain.  Denies emesis, fevers, shortness of breath, chest pain, cough, dysuria or frequency, constipation or stool changes.  Past Medical History:  Diagnosis Date   Anemia    Anxiety    Arthritis    Cardiac arrhythmia    Hyperlipidemia    Hypertension    Pancreatic pseudocyst    Seizure ([place])    Stomach ulcer     Patient Active Problem List   Diagnosis Date Noted   Acute on chronic pancreatitis ([place]) 06/18/2020   Chronic pancreatitis ([place]) 06/12/2020   Cyst of pancreas 04/26/2020   Abdominal aortic aneurysm (AAA) without rupture ([place]) 03/17/2020   Seizure ([place])    Mixed dyslipidemia 11/24/2017   Hypertensive kidney disease with CKD stage III ([place]) 11/03/2014   Essential hypertension 10/01/2014    Past Surgical History:  Procedure Laterality Date   BIOPSY  05/20/2020   Procedure: BIOPSY;  Surgeon: Irving Copas., MD;  Location: [facility];  Service: Gastroenterology;;   ESOPHAGOGASTRODUODENOSCOPY N/A 05/20/2020   Procedure: ESOPHAGOGASTRODUODENOSCOPY (EGD);  Surgeon: Irving Copas., MD;  Location: [facility]OS[facility];  Service: Gastroenterology;  Laterality: N/A;   LAPAROSCOPIC OVARIAN CYSTECTOMY     TONSILLECTOMY     UPPER ESOPHAGEAL ENDOSCOPIC ULTRASOUND (EUS) N/A 05/20/2020   Procedure: UPPER ESOPHAGEAL ENDOSCOPIC ULTRASOUND (EUS)  ;  Surgeon: Irving Copas., MD;  Location: [facility];  Service: Gastroenterology;  Laterality: N/A;    Prior to Admission medications   Medication Sig Start Date End Date Taking? Authorizing Provider  atenolol (TENORMIN) 50 MG tablet Take 1 tablet (50 mg total) by mouth daily. 06/10/20  Yes Jearld Fenton, NP  co-enzyme Q-10 30 MG capsule Take 30 mg by mouth 3 (three) times daily.   Yes [provider]  ECHINACEA PO Take 900 mg by mouth 2 (two) times daily.   Yes [provider]  fluticasone (FLONASE) 50 MCG/ACT nasal spray Place 2 sprays into both nostrils daily. 03/14/20  Yes Geradine Girt, DO  lipase/protease/amylase (CREON) 36000 UNITS CPEP capsule Take 2 capsules (72,000 Units total) by mouth 3 (three) times daily with meals. Take 2 capsules (72,000 Units total) by mouth 3 (three) times daily with meals and 36000 with snack Patient taking differently: Take 72,000 Units by mouth See admin instructions. Take 2 capsules (72,000 Units total) by mouth 3 (three) times daily with meals and 36000 with snack 03/13/20  Yes Vann, Jessica U, DO  lisinopril (ZESTRIL) 10 MG tablet Take 1  tablet (10 mg total) by mouth daily. 06/10/20  Yes Baity, Coralie Keens, NP  OVER THE COUNTER MEDICATION Take 1 capsule by mouth 2 (two) times daily. Runge Formula   Yes [provider]  pantoprazole (PROTONIX) 40 MG tablet Take 1 tablet (40 mg total) by mouth at bedtime. 06/10/20  Yes Baity, Coralie Keens, NP  Red Yeast Rice 600 MG CAPS Take 600 mg by mouth 2 (two)  times daily.   Yes [provider]  simethicone (MYLICON) 80 MG chewable tablet Chew 80 mg by mouth in the morning, at noon, in the evening, and at bedtime.   Yes [provider]  TRESIBA FLEXTOUCH 100 UNIT/ML FlexTouch Pen Inject 7 Units into the skin daily. Adjust dose as advised. Patient taking differently: Inject 8 Units into the skin daily. Adjust dose as advised. 05/20/20  Yes Karamalegos, Devonne Doughty, DO  blood glucose meter kit and supplies KIT Dispense based on patient and insurance preference. Use up to four times daily as directed. 03/13/20   Geradine Girt, DO  Insulin Pen Needle (NOVOFINE PEN NEEDLE) 32G X 6 MM MISC Use to inject insulin daily as directed. 04/13/20   Olin Hauser, DO    Allergies Patient has no known allergies.  Family History  Problem Relation Age of Onset   Heart disease Mother    Kidney disease Mother    Diabetes Father    Pancreatic cancer Brother        Agent Orange   Diabetes Paternal Uncle        x 2   Colon cancer Son 46   Esophageal cancer Neg Hx    Inflammatory bowel disease Neg Hx    Liver disease Neg Hx    Stomach cancer Neg Hx     Social History Social History   Tobacco Use   Smoking status: Every Day    Packs/day: 0.25    Pack years: 0.00    Types: Cigarettes    Last attempt to quit: 02/29/2020    Years since quitting: 0.3   Smokeless tobacco: Never  Vaping Use   Vaping Use: Never used  Substance Use Topics   Alcohol use: Not Currently    Alcohol/week: 0.0 standard drinks    Comment: occasionally    Drug use: No    Review of Systems  Constitutional: No fever/chills Eyes: No visual changes. ENT: No sore throat. Cardiovascular: Denies chest pain. Respiratory: Denies shortness of breath. Gastrointestinal: Positive for abdominal pain  No nausea, no vomiting.  No diarrhea.  No constipation. Genitourinary: Negative for dysuria. Musculoskeletal: Negative for back pain. Skin: Negative for  rash. Neurological: Negative for headaches, focal weakness or numbness.   ____________________________________________   PHYSICAL EXAM:  VITAL SIGNS: Vitals:   06/18/20 1612 06/18/20 1722  BP: (!) 151/78 (!) 160/80  Pulse: 70 70  Resp: 18 16  Temp:    SpO2: 98% 100%    Constitutional: Alert and oriented. Well appearing and in no acute distress. Eyes: Conjunctivae are normal. PERRL. EOMI. Head: Atraumatic. Nose: No congestion/rhinnorhea. Mouth/Throat: Mucous membranes are moist.  Oropharynx non-erythematous. Neck: No stridor. No cervical spine tenderness to palpation. Cardiovascular: Normal rate, regular rhythm. Grossly normal heart sounds.  Good peripheral circulation. Respiratory: Normal respiratory effort.  No retractions. Lungs CTAB. Gastrointestinal: Soft , nondistended,. No CVA tenderness Seems hyperalgesic, she flinches whenever I barely stroke her skin throughout her abdomen. Most tender to the epigastrium and the periumbilical abdomen with some voluntary guarding. Musculoskeletal:  No lower extremity tenderness nor edema.  No joint effusions. No signs of acute trauma. Neurologic:  Normal speech and language. No gross focal neurologic deficits are appreciated. No gait instability noted. Skin:  Skin is warm, dry and intact. No rash noted. Psychiatric: Mood and affect are normal. Speech and behavior are normal. ____________________________________________   LABS (all labs ordered are listed, but only abnormal results are displayed)  Labs Reviewed  COMPREHENSIVE METABOLIC PANEL - Abnormal; Notable for the following components:      Result Value   Glucose, Bld 146 (*)    Creatinine, Ser 1.11 (*)    GFR, Estimated 52 (*)    All other components within normal limits  CBC - Abnormal; Notable for the following components:   RBC 5.14 (*)    Hemoglobin 15.1 (*)    All other components within normal limits  SARS CORONAVIRUS 2 (TAT 6-24 HRS)  LIPASE, BLOOD  URINALYSIS,  COMPLETE (UACMP) WITH MICROSCOPIC   ____________________________________________  12 Lead EKG  Sinus rhythm, rate of 59 bpm.  Normal axis .  No evidence of acute ischemia.  Incomplete right bundle. ____________________________________________  RADIOLOGY  ED MD interpretation:  CT reviewed by me with   Official radiology report(s): CT ABDOMEN PELVIS W CONTRAST  Result Date: 06/18/2020 CLINICAL DATA:  History of pancreatitis.  Worsening abdominal pain. EXAM: CT ABDOMEN AND PELVIS WITH CONTRAST TECHNIQUE: Multidetector CT imaging of the abdomen and pelvis was performed using the standard protocol following bolus administration of intravenous contrast. CONTRAST:  161m OMNIPAQUE IOHEXOL 300 MG/ML  SOLN COMPARISON:  05/27/2020 FINDINGS: Lower chest: Centrilobular emphsyema noted. Hepatobiliary: No suspicious focal abnormality within the liver parenchyma. There is no evidence for gallstones, gallbladder wall thickening, or pericholecystic fluid. No intrahepatic or extrahepatic biliary dilation. Pancreas: As before, there is enhancing pancreatic parenchyma in the head of the pancreas. The organized collection replacing the pancreas through the body and tail has decreased in size in the interval. Measuring on axial imaging, this collection is 9.7 x 3.5 cm today (30/2) which compares to 12.7 x 4.8 cm when hree measuring in a similar fashion on image 21/series 11 of the prior study. As before, there is a small nodular focus of enhancement posteriorly in the right aspect of the lesion also visible on image 30/2. New since the prior study, there is edema/inflammation tracking cranially up between the gastric fundus in the splenic hilum in then posteriorly around the medial aspect of the spleen and up towards the subdiaphragmatic space. This new edema/inflammation dissects posteriorly into the left anterior pararenal space immediately adjacent to the anterior left kidney. Spleen: No splenomegaly. No focal mass  lesion. Adrenals/Urinary Tract: No adrenal nodule or mass. Kidneys unremarkable. No evidence for hydroureter. The urinary bladder appears normal for the degree of distention. Stomach/Bowel: Stomach is unremarkable. No gastric wall thickening. No evidence of outlet obstruction. Duodenum is normally positioned as is the ligament of Treitz. No small bowel wall thickening. No small bowel dilatation. The terminal ileum is normal. The appendix is normal. No gross colonic mass. No colonic wall thickening. Vascular/Lymphatic: There is abdominal aortic atherosclerosis without aneurysm. Infrarenal abdominal aorta measures 3.2 cm diameter. Portal vein and superior mesenteric vein are patent although there is marked mass-effect/attenuation on the portal splenic confluence. Splenic vein patency cannot be confirmed. Celiac axis and SMA are unremarkable. There is no gastrohepatic or hepatoduodenal ligament lymphadenopathy. No retroperitoneal or mesenteric lymphadenopathy. No pelvic sidewall lymphadenopathy. Reproductive: The uterus is unremarkable.  There is no adnexal mass. Other:  No substantial intraperitoneal free fluid. Musculoskeletal: No worrisome lytic or sclerotic osseous abnormality. IMPRESSION: 1. Interval decrease in size of the organized collection replacing the pancreas through the body and tail. There is new edema/inflammation tracking cranially up between the gastric fundus in the splenic hilum and then posteriorly around the medial aspect of the spleen and up towards the subdiaphragmatic space. This new edema/inflammation dissects posteriorly into the left anterior pararenal space immediately adjacent to the anterior left kidney. Imaging features suggest acute on chronic pancreatitis 2. Splenic vein patency cannot be confirmed. Portal vein and superior mesenteric vein are patent although there is marked mass-effect/attenuation on the portal splenic confluence. 3. 3.2 cm infrarenal abdominal aortic diameter.  Recommend follow-up every 3 years. This recommendation follows ACR consensus guidelines: White Paper of the ACR Incidental Findings Committee II on Vascular Findings. J Am Coll Radiol 2013; 16:109-604. 4. Aortic Atherosclerosis (ICD10-I70.0). Electronically Signed   By: Misty Stanley M.D.   On: 06/18/2020 17:21    ____________________________________________   PROCEDURES and INTERVENTIONS  Procedure(s) performed (including Critical Care):  .1-3 Lead EKG Interpretation  Date/Time: 06/18/2020 7:51 PM Performed by: Vladimir Crofts, MD Authorized by: Vladimir Crofts, MD     Interpretation: normal     ECG rate:  74   ECG rate assessment: normal     Rhythm: sinus rhythm     Ectopy: none     Conduction: normal    Medications  fentaNYL (SUBLIMAZE) injection 50 mcg (50 mcg Intravenous Not Given 06/18/20 1711)  lactated ringers bolus 1,000 mL (1,000 mLs Intravenous New Bag/Given 06/18/20 1710)  iohexol (OMNIPAQUE) 300 MG/ML solution 100 mL (100 mLs Intravenous Contrast Given 06/18/20 1656)  HYDROmorphone (DILAUDID) injection 0.5 mg (0.5 mg Intravenous Given 06/18/20 1810)    ____________________________________________   MDM / ED COURSE   75 year old woman with history of idiopathic pancreatitis with significant necrosis and Creon-dependent, presents to the ED with acutely worsening pain, with evidence of acute pancreatitis requiring medical admission for pain control and GI consultation.  Exam with significant epigastric and periumbilical tenderness with voluntary guarding.  She appears uncomfortable, but no distress.  No neurologic or vascular deficits.  Blood work with no significant acute derangements.  CT obtained and with significant stranding inflammatory changes from her pancreas upper gastric fundus towards her diaphragm the left, likely the source of her radiating pain.  Discussed the case with GI, who indicates that they will be able to see the patient in the morning.  We will admit to  medicine for further symptom control and subsequent GI consultation.  Clinical Course as of 06/18/20 1951  Thu Jun 18, 2020  1755 Dr. Vicente Males will see in the AM [DS]    Clinical Course User Index [DS] Vladimir Crofts, MD    ____________________________________________   FINAL CLINICAL IMPRESSION(S) / ED DIAGNOSES  Final diagnoses:  Idiopathic acute pancreatitis with uninfected necrosis  Periumbilical abdominal pain     ED Discharge Orders     None        Lowell Makara Tamala Julian   Note:  This document was prepared using Dragon voice recognition software and may include unintentional dictation errors.    Vladimir Crofts, MD 06/18/20 [phone]

## 2020-06-18 NOTE — H&P (Signed)
History and Physical    Holly Navarro IRS:[id number] DOB: [birth year] DOA: 06/18/2020  PCP: Jearld Fenton, NP   Patient coming from: Home  I have personally briefly reviewed patient's old medical records in [place]  Chief Complaint: Abdominal pain  HPI: Holly Navarro is a 75 y.o. 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  She rates her pain a 6 x 10 in intensity at its worst and denies having any nausea, no vomiting, no fever, no chills, no chest pain, no shortness of breath, no cough, no urinary symptoms or any changes in her bowel habits. Her symptoms started this morning when she woke up and was not related to meals. Labs show sodium 136, potassium 4.4, chloride 102, bicarb 25, glucose 146, BUN 15, creatinine 1.1, calcium 9.9, alkaline phosphatase 54, albumin 4.0, lipase 47, AST 20, ALT 14, total protein 7.5, white count 9.1, hemoglobin 15.1, hematocrit 45.8, MCV 89.1, RDW 13.2, platelet count 228, Respiratory viral panel is pending CT scan of abdomen and pelvis with contrast  Shows interval decrease in size of the organized collection replacing the pancreas through the body and tail. There is new edema/inflammation tracking cranially up between the gastric fundus in the splenic hilum and then posteriorly around the medial aspect of the spleen and up towards the subdiaphragmatic space. This new edema/inflammation dissects posteriorly into the left anterior pararenal space immediately adjacent to the anterior left kidney. Imaging features suggest acute on chronic pancreatitis Splenic vein patency  cannot be confirmed. Portal vein and superior mesenteric vein are patent although there is marked mass-effect/attenuation on the portal splenic confluence. 3.2 cm infrarenal abdominal aortic diameter. Recommend follow-up every 3 years.  Twelve-lead EKG shows sinus bradycardia with low voltage QRS   ED Course: Patient is a 75 year old Caucasian female with a history of idiopathic pancreatitis with sterile walled off necrosis and new onset diabetes mellitus who presents to the ER for evaluation of abdominal pain which started acutely in the right upper quadrant and radiates through the epigastrium to the left upper quadrant and her back. Imaging shows acute on chronic pancreatitis. GI was consulted in the ER patient will be admitted to the hospital for further evaluation.      Review of Systems: As per HPI otherwise all other systems reviewed and negative.    Past Medical History:  Diagnosis Date   Anemia    Anxiety    Arthritis    Cardiac arrhythmia    Hyperlipidemia    Hypertension    Pancreatic pseudocyst    Seizure ([place])    Stomach ulcer     Past Surgical History:  Procedure Laterality Date   BIOPSY  05/20/2020   Procedure: BIOPSY;  Surgeon: Irving Copas., MD;  Location: [facility];  Service: Gastroenterology;;   ESOPHAGOGASTRODUODENOSCOPY N/A 05/20/2020   Procedure: ESOPHAGOGASTRODUODENOSCOPY (EGD);  Surgeon: Irving Copas., MD;  Location: [facility];  Service: Gastroenterology;  Laterality: N/A;   LAPAROSCOPIC OVARIAN CYSTECTOMY     TONSILLECTOMY     UPPER ESOPHAGEAL ENDOSCOPIC ULTRASOUND (EUS) N/A 05/20/2020   Procedure: UPPER ESOPHAGEAL ENDOSCOPIC ULTRASOUND (EUS)  ;  Surgeon: Rush Landmark Telford Nab., MD;  Location: [facility];  Service:  Gastroenterology;  Laterality: N/A;     reports that she has been smoking cigarettes. She has been smoking an average of 0.25 packs per day. She has never used smokeless tobacco. She reports previous alcohol use.  She reports that she does not use drugs.  No Known Allergies  Family History  Problem Relation Age of Onset   Heart disease Mother    Kidney disease Mother    Diabetes Father    Pancreatic cancer Brother        Agent Orange   Diabetes Paternal Uncle        x 2   Colon cancer Son 29   Esophageal cancer Neg Hx    Inflammatory bowel disease Neg Hx    Liver disease Neg Hx    Stomach cancer Neg Hx       Prior to Admission medications   Medication Sig Start Date End Date Taking? Authorizing Provider  atenolol (TENORMIN) 50 MG tablet Take 1 tablet (50 mg total) by mouth daily. 06/10/20  Yes Jearld Fenton, NP  co-enzyme Q-10 30 MG capsule Take 30 mg by mouth 3 (three) times daily.   Yes [provider]  ECHINACEA PO Take 900 mg by mouth 2 (two) times daily.   Yes [provider]  fluticasone (FLONASE) 50 MCG/ACT nasal spray Place 2 sprays into both nostrils daily. 03/14/20  Yes Geradine Girt, DO  lipase/protease/amylase (CREON) 36000 UNITS CPEP capsule Take 2 capsules (72,000 Units total) by mouth 3 (three) times daily with meals. Take 2 capsules (72,000 Units total) by mouth 3 (three) times daily with meals and 36000 with snack Patient taking differently: Take 72,000 Units by mouth See admin instructions. Take 2 capsules (72,000 Units total) by mouth 3 (three) times daily with meals and 36000 with snack 03/13/20  Yes Vann, Jessica U, DO  lisinopril (ZESTRIL) 10 MG tablet Take 1 tablet (10 mg total) by mouth daily. 06/10/20  Yes Baity, Coralie Keens, NP  OVER THE COUNTER MEDICATION Take 1 capsule by mouth 2 (two) times daily. [place] Formula   Yes [provider]  pantoprazole (PROTONIX) 40 MG tablet Take 1 tablet (40 mg total) by mouth at bedtime. 06/10/20  Yes Baity, Coralie Keens, NP  Red Yeast Rice 600 MG CAPS Take 600 mg by mouth 2 (two) times daily.   Yes [provider]  simethicone (MYLICON) 80 MG chewable tablet Chew 80 mg by mouth in  the morning, at noon, in the evening, and at bedtime.   Yes [provider]  TRESIBA FLEXTOUCH 100 UNIT/ML FlexTouch Pen Inject 7 Units into the skin daily. Adjust dose as advised. Patient taking differently: Inject 8 Units into the skin daily. Adjust dose as advised. 05/20/20  Yes Karamalegos, Devonne Doughty, DO  blood glucose meter kit and supplies KIT Dispense based on patient and insurance preference. Use up to four times daily as directed. 03/13/20   Geradine Girt, DO  Insulin Pen Needle (NOVOFINE PEN NEEDLE) 32G X 6 MM MISC Use to inject insulin daily as directed. 04/13/20   Olin Hauser, DO    Physical Exam: Vitals:   06/18/20 1612 06/18/20 1617 06/18/20 1722 06/18/20 1956  BP: (!) 151/78  (!) 160/80 (!) 137/100  Pulse: 70  70 70  Resp: 18  16 16   Temp:    98.4 F (36.9 C)  TempSrc:    Oral  SpO2: 98%  100% 100%  Weight:  61.2 kg  Height:  5' 5"  (1.651 m)       Vitals:   06/18/20 1612 06/18/20 1617 06/18/20 1722 06/18/20 1956  BP: (!) 151/78  (!) 160/80 (!) 137/100  Pulse: 70  70 70  Resp: 18  16 16   Temp:    98.4 F (36.9 C)  TempSrc:    Oral  SpO2: 98%  100% 100%  Weight:  61.2 kg    Height:  5' 5"  (1.651 m)        Constitutional: Alert and oriented x 3 . Not in any apparent distress HEENT:      Head: Normocephalic and atraumatic.         Eyes: PERLA, EOMI, Conjunctivae are normal. Sclera is non-icteric.       Mouth/Throat: Mucous membranes are moist.       Neck: Supple with no signs of meningismus. Cardiovascular: Regular rate and rhythm. No murmurs, gallops, or rubs. 2+ symmetrical distal pulses are present . No JVD. No LE edema Respiratory: Respiratory effort normal .Lungs sounds clear bilaterally. No wheezes, crackles, or rhonchi.  Gastrointestinal: Soft, tender in the right upper quadrant, epigastrium and left upper quadrant, non distended with positive bowel sounds.  Genitourinary: No CVA tenderness. Musculoskeletal: Nontender with  normal range of motion in all extremities. No cyanosis, or erythema of extremities. Neurologic:  Face is symmetric. Moving all extremities. No gross focal neurologic deficits . Skin: Skin is warm, dry.  No rash or ulcers Psychiatric: Mood and affect are normal    Labs on Admission: I have personally reviewed following labs and imaging studies  CBC: Recent Labs  Lab 06/18/20 1510  WBC 9.1  HGB 15.1*  HCT 45.8  MCV 89.1  PLT 828   Basic Metabolic Panel: Recent Labs  Lab 06/18/20 1510  NA 136  K 4.4  CL 102  CO2 25  GLUCOSE 146*  BUN 15  CREATININE 1.11*  CALCIUM 9.9   GFR: Estimated Creatinine Clearance: 39.4 mL/min (A) (by C-G formula based on SCr of 1.11 mg/dL (H)). Liver Function Tests: Recent Labs  Lab 06/18/20 1510  AST 20  ALT 14  ALKPHOS 54  BILITOT 0.7  PROT 7.5  ALBUMIN 4.0   Recent Labs  Lab 06/18/20 1510  LIPASE 47   No results for input(s): AMMONIA in the last 168 hours. Coagulation Profile: No results for input(s): INR, PROTIME in the last 168 hours. Cardiac Enzymes: No results for input(s): CKTOTAL, CKMB, CKMBINDEX, TROPONINI in the last 168 hours. BNP (last 3 results) No results for input(s): PROBNP in the last 8760 hours. HbA1C: No results for input(s): HGBA1C in the last 72 hours. CBG: No results for input(s): GLUCAP in the last 168 hours. Lipid Profile: No results for input(s): CHOL, HDL, LDLCALC, TRIG, CHOLHDL, LDLDIRECT in the last 72 hours. Thyroid Function Tests: No results for input(s): TSH, T4TOTAL, FREET4, T3FREE, THYROIDAB in the last 72 hours. Anemia Panel: No results for input(s): VITAMINB12, FOLATE, FERRITIN, TIBC, IRON, RETICCTPCT in the last 72 hours. Urine analysis:    Component Value Date/Time   COLORURINE YELLOW (A) 03/05/2020 0916   APPEARANCEUR CLEAR (A) 03/05/2020 0916   LABSPEC 1.018 03/05/2020 0916   PHURINE 6.0 03/05/2020 0916   GLUCOSEU NEGATIVE 03/05/2020 0916   HGBUR SMALL (A) 03/05/2020 0916    BILIRUBINUR NEGATIVE 03/05/2020 0916   KETONESUR NEGATIVE 03/05/2020 0916   PROTEINUR 100 (A) 03/05/2020 0916   NITRITE NEGATIVE 03/05/2020 0916   LEUKOCYTESUR NEGATIVE 03/05/2020 0916    Radiological Exams on Admission: CT  ABDOMEN PELVIS W CONTRAST  Result Date: 06/18/2020 CLINICAL DATA:  History of pancreatitis.  Worsening abdominal pain. EXAM: CT ABDOMEN AND PELVIS WITH CONTRAST TECHNIQUE: Multidetector CT imaging of the abdomen and pelvis was performed using the standard protocol following bolus administration of intravenous contrast. CONTRAST:  134m OMNIPAQUE IOHEXOL 300 MG/ML  SOLN COMPARISON:  05/27/2020 FINDINGS: Lower chest: Centrilobular emphsyema noted. Hepatobiliary: No suspicious focal abnormality within the liver parenchyma. There is no evidence for gallstones, gallbladder wall thickening, or pericholecystic fluid. No intrahepatic or extrahepatic biliary dilation. Pancreas: As before, there is enhancing pancreatic parenchyma in the head of the pancreas. The organized collection replacing the pancreas through the body and tail has decreased in size in the interval. Measuring on axial imaging, this collection is 9.7 x 3.5 cm today (30/2) which compares to 12.7 x 4.8 cm when hree measuring in a similar fashion on image 21/series 11 of the prior study. As before, there is a small nodular focus of enhancement posteriorly in the right aspect of the lesion also visible on image 30/2. New since the prior study, there is edema/inflammation tracking cranially up between the gastric fundus in the splenic hilum in then posteriorly around the medial aspect of the spleen and up towards the subdiaphragmatic space. This new edema/inflammation dissects posteriorly into the left anterior pararenal space immediately adjacent to the anterior left kidney. Spleen: No splenomegaly. No focal mass lesion. Adrenals/Urinary Tract: No adrenal nodule or mass. Kidneys unremarkable. No evidence for hydroureter. The  urinary bladder appears normal for the degree of distention. Stomach/Bowel: Stomach is unremarkable. No gastric wall thickening. No evidence of outlet obstruction. Duodenum is normally positioned as is the ligament of Treitz. No small bowel wall thickening. No small bowel dilatation. The terminal ileum is normal. The appendix is normal. No gross colonic mass. No colonic wall thickening. Vascular/Lymphatic: There is abdominal aortic atherosclerosis without aneurysm. Infrarenal abdominal aorta measures 3.2 cm diameter. Portal vein and superior mesenteric vein are patent although there is marked mass-effect/attenuation on the portal splenic confluence. Splenic vein patency cannot be confirmed. Celiac axis and SMA are unremarkable. There is no gastrohepatic or hepatoduodenal ligament lymphadenopathy. No retroperitoneal or mesenteric lymphadenopathy. No pelvic sidewall lymphadenopathy. Reproductive: The uterus is unremarkable.  There is no adnexal mass. Other: No substantial intraperitoneal free fluid. Musculoskeletal: No worrisome lytic or sclerotic osseous abnormality. IMPRESSION: 1. Interval decrease in size of the organized collection replacing the pancreas through the body and tail. There is new edema/inflammation tracking cranially up between the gastric fundus in the splenic hilum and then posteriorly around the medial aspect of the spleen and up towards the subdiaphragmatic space. This new edema/inflammation dissects posteriorly into the left anterior pararenal space immediately adjacent to the anterior left kidney. Imaging features suggest acute on chronic pancreatitis 2. Splenic vein patency cannot be confirmed. Portal vein and superior mesenteric vein are patent although there is marked mass-effect/attenuation on the portal splenic confluence. 3. 3.2 cm infrarenal abdominal aortic diameter. Recommend follow-up every 3 years. This recommendation follows ACR consensus guidelines: White Paper of the ACR  Incidental Findings Committee II on Vascular Findings. J Am Coll Radiol 2013;; [id number] 4. Aortic Atherosclerosis (ICD10-I70.0). Electronically Signed   By: EMisty StanleyM.D.   On: 06/18/2020 17:21     Assessment/Plan Principal Problem:   Acute on chronic pancreatitis ([place] Active Problems:   Essential hypertension   Hypertensive kidney disease with CKD stage III (HCC)   Abdominal aortic aneurysm (AAA) without rupture (HCC)   Type 2 diabetes mellitus  without complication ([place])     Acute on chronic pancreatitis Patient has a history of idiopathic pancreatitis with stable walled off necrosis of her pancreas She presents to the ER for evaluation of sudden onset abdominal pain which started in the right upper quadrant and radiates across the epigastrium to the left upper quadrant. Imaging is suggestive of acute on chronic pancreatitis Keep patient n.p.o. for now Supportive care with pain control, IV antiemetics, IV PPI and IV fluid hydration     Diabetes mellitus Keep patient n.p.o. Glycemic control with sliding scale insulin Blood sugar checks every 4 hours    Hypertensive heart disease with stage III chronic kidney disease Continue atenolol and lisinopril     DVT prophylaxis: Lovenox  Code Status: full code  Family Communication: Greater than 50% of time was spent discussing patient's condition and plan of care with her at the bedside.  All questions and concerns have been addressed.  She verbalizes understanding and agrees to the plan. Disposition Plan: Back to previous home environment Consults called: Gastroenterology Status: At the time of admission, it is felt that the appropriate admission status for this patient is inpatient. This is judged to be reasonable and necessary in order to provide the required intensity of service to ensure the patient's safety given the presenting symptoms, physical exam findings, and initial radiographic and laboratory data in the  context of their comorbid conditions. Patient requires inpatient status due to high intensity of service, high risk for further deterioration and high frequency of surveillance required.     Collier Bullock MD [facility]     06/18/2020, 8:32 PM

## 2020-06-19 ENCOUNTER — Encounter: Payer: Self-pay | Admitting: Gastroenterology

## 2020-06-19 DIAGNOSIS — K861 Other chronic pancreatitis: Secondary | ICD-10-CM

## 2020-06-19 DIAGNOSIS — K8501 Idiopathic acute pancreatitis with uninfected necrosis: Secondary | ICD-10-CM | POA: Diagnosis not present

## 2020-06-19 DIAGNOSIS — K859 Acute pancreatitis without necrosis or infection, unspecified: Secondary | ICD-10-CM | POA: Diagnosis not present

## 2020-06-19 DIAGNOSIS — R109 Unspecified abdominal pain: Secondary | ICD-10-CM | POA: Diagnosis present

## 2020-06-19 LAB — CBC
HCT: 40.8 % (ref 36.0–46.0)
Hemoglobin: 13.4 g/dL (ref 12.0–15.0)
MCH: 28.8 pg (ref 26.0–34.0)
MCHC: 32.8 g/dL (ref 30.0–36.0)
MCV: 87.7 fL (ref 80.0–100.0)
Platelets: 193 10*3/uL (ref 150–400)
RBC: 4.65 MIL/uL (ref 3.87–5.11)
RDW: 13.4 % (ref 11.5–15.5)
WBC: 7.8 10*3/uL (ref 4.0–10.5)
nRBC: 0 % (ref 0.0–0.2)

## 2020-06-19 LAB — BASIC METABOLIC PANEL
Anion gap: 6 (ref 5–15)
BUN: 14 mg/dL (ref 8–23)
CO2: 23 mmol/L (ref 22–32)
Calcium: 9 mg/dL (ref 8.9–10.3)
Chloride: 109 mmol/L (ref 98–111)
Creatinine, Ser: 1.08 mg/dL — ABNORMAL HIGH (ref 0.44–1.00)
GFR, Estimated: 54 mL/min — ABNORMAL LOW (ref >60)
Glucose, Bld: 125 mg/dL — ABNORMAL HIGH (ref 70–99)
Potassium: 3.8 mmol/L (ref 3.5–5.1)
Sodium: 138 mmol/L (ref 135–145)

## 2020-06-19 LAB — URINALYSIS, COMPLETE (UACMP) WITH MICROSCOPIC
Bilirubin Urine: NEGATIVE
Glucose, UA: NEGATIVE mg/dL
Hgb urine dipstick: NEGATIVE
Ketones, ur: NEGATIVE mg/dL
Leukocytes,Ua: NEGATIVE
Nitrite: NEGATIVE
Protein, ur: NEGATIVE mg/dL
Specific Gravity, Urine: >1.046 — ABNORMAL HIGH (ref 1.005–1.030)
pH: 6 (ref 5.0–8.0)

## 2020-06-19 LAB — GLUCOSE, CAPILLARY
Glucose-Capillary: 115 mg/dL — ABNORMAL HIGH (ref 70–99)
Glucose-Capillary: 134 mg/dL — ABNORMAL HIGH (ref 70–99)
Glucose-Capillary: 75 mg/dL (ref 70–99)

## 2020-06-19 LAB — SARS CORONAVIRUS 2 (TAT 6-24 HRS): SARS Coronavirus 2: NEGATIVE

## 2020-06-19 LAB — HEMOGLOBIN A1C
Hgb A1c MFr Bld: 10 % — ABNORMAL HIGH (ref 4.8–5.6)
Mean Plasma Glucose: 240 mg/dL

## 2020-06-19 MED ORDER — PANCRELIPASE (LIP-PROT-AMYL) 36000-114000 UNITS PO CPEP
72000.0000 [IU] | ORAL_CAPSULE | Freq: Three times a day (TID) | ORAL | Status: DC
  Filled 2020-06-19: qty 2

## 2020-06-19 MED ORDER — OXYCODONE HCL 5 MG PO TABS: 5.0000 mg | ORAL_TABLET | ORAL | Status: DC | PRN

## 2020-06-19 MED ORDER — PANCRELIPASE (LIP-PROT-AMYL) 36000-114000 UNITS PO CPEP
72000.0000 [IU] | ORAL_CAPSULE | Freq: Three times a day (TID) | ORAL | Status: DC
  Administered 2020-06-19: 72000 [IU] via ORAL
  Filled 2020-06-19 (×2): qty 2

## 2020-06-19 MED ORDER — ENSURE ENLIVE PO LIQD
237.0000 mL | Freq: Two times a day (BID) | ORAL | Status: DC
  Administered 2020-06-19: 237 mL via ORAL

## 2020-06-19 NOTE — Consult Note (Addendum)
Cephas Darby, MD [address]  [place]  [place], [place] [address]  Main: [phone]  Fax: [phone] Pager: [phone]   Consultation  Referring Provider:     No ref. provider found Primary Care Physician:  Jearld Fenton, NP Primary Gastroenterologist:  Dr. Sherri Sear         Reason for Consultation:     Acute on chronic pancreatitis  Date of Admission:  06/18/2020 Date of Consultation:  06/19/2020         HPI:   Holly Navarro is a 75 y.o. female with history of acute on chronic pancreatitis, possible walled off necrosis and pseudocyst of the pancreas.  Patient presented yesterday due to worsening of epigastric pain that started suddenly.  She denies any change in her food habits, could not identify any particular triggers.  On arrival, her vitals were stable, afebrile, labs revealed slight increase in lipase from 30 to 47, although within normal limits.  No leukocytosis.  She underwent CT abdomen and pelvis with findings as below  There is enhancing pancreatic parenchyma in the head of the pancreas. The organized collection replacing the pancreas through the body and tail has decreased in size in the interval. Measuring on axial imaging, this collection is 9.7 x 3.5 cm today (30/2) which compares to 12.7 x 4.8 cm when hree measuring in a similar fashion on image 21/series 11 of the prior study. As before, there is a small nodular focus of enhancement posteriorly in the right aspect of the lesion also visible on image 30/2.   New since the prior study, there is edema/inflammation tracking cranially up between the gastric fundus in the splenic hilum in then posteriorly around the medial aspect of the spleen and up towards the subdiaphragmatic space. This new edema/inflammation dissects posteriorly into the left anterior pararenal space immediately adjacent to the anterior left kidney.  Patient did notice that her abdominal distention has improved since  this episode of pain, compared to baseline.  She did have some discomfort radiating to her back. She is currently n.p.o. patient reports being compliant with pancreatic enzymes as outpatient  Patient is closely followed by the advanced endoscopist, Dr. Rush Landmark for management of pancreatic fluid collections  NSAIDs: None  Antiplts/Anticoagulants/Anti thrombotics: None  GI Procedures: Refer to my outpatient notes  Past Medical History:  Diagnosis Date   Anemia    Anxiety    Arthritis    Cardiac arrhythmia    Hyperlipidemia    Hypertension    Pancreatic pseudocyst    Seizure ([place])    Stomach ulcer     Past Surgical History:  Procedure Laterality Date   BIOPSY  05/20/2020   Procedure: BIOPSY;  Surgeon: Irving Copas., MD;  Location: [facility];  Service: Gastroenterology;;   ESOPHAGOGASTRODUODENOSCOPY N/A 05/20/2020   Procedure: ESOPHAGOGASTRODUODENOSCOPY (EGD);  Surgeon: Irving Copas., MD;  Location: [facility];  Service: Gastroenterology;  Laterality: N/A;   LAPAROSCOPIC OVARIAN CYSTECTOMY     TONSILLECTOMY     UPPER ESOPHAGEAL ENDOSCOPIC ULTRASOUND (EUS) N/A 05/20/2020   Procedure: UPPER ESOPHAGEAL ENDOSCOPIC ULTRASOUND (EUS)  ;  Surgeon: Irving Copas., MD;  Location: [facility];  Service: Gastroenterology;  Laterality: N/A;    Prior to Admission medications   Medication Sig Start Date End Date Taking? Authorizing Provider  atenolol (TENORMIN) 50 MG tablet Take 1 tablet (50 mg total) by mouth daily. 06/10/20  Yes Jearld Fenton, NP  co-enzyme Q-10 30 MG capsule Take 30  mg by mouth 3 (three) times daily.   Yes [provider]  ECHINACEA PO Take 900 mg by mouth 2 (two) times daily.   Yes [provider]  fluticasone (FLONASE) 50 MCG/ACT nasal spray Place 2 sprays into both nostrils daily. 03/14/20  Yes Geradine Girt, DO  lipase/protease/amylase (CREON) 36000 UNITS CPEP capsule Take 2 capsules (72,000 Units total) by mouth 3  (three) times daily with meals. Take 2 capsules (72,000 Units total) by mouth 3 (three) times daily with meals and 36000 with snack Patient taking differently: Take 72,000 Units by mouth See admin instructions. Take 2 capsules (72,000 Units total) by mouth 3 (three) times daily with meals and 36000 with snack 03/13/20  Yes Vann, Jessica U, DO  lisinopril (ZESTRIL) 10 MG tablet Take 1 tablet (10 mg total) by mouth daily. 06/10/20  Yes Baity, Coralie Keens, NP  OVER THE COUNTER MEDICATION Take 1 capsule by mouth 2 (two) times daily. Madelia Formula   Yes [provider]  pantoprazole (PROTONIX) 40 MG tablet Take 1 tablet (40 mg total) by mouth at bedtime. 06/10/20  Yes Baity, Coralie Keens, NP  Red Yeast Rice 600 MG CAPS Take 600 mg by mouth 2 (two) times daily.   Yes [provider]  simethicone (MYLICON) 80 MG chewable tablet Chew 80 mg by mouth in the morning, at noon, in the evening, and at bedtime.   Yes [provider]  TRESIBA FLEXTOUCH 100 UNIT/ML FlexTouch Pen Inject 7 Units into the skin daily. Adjust dose as advised. Patient taking differently: Inject 8 Units into the skin daily. Adjust dose as advised. 05/20/20  Yes Karamalegos, Devonne Doughty, DO  blood glucose meter kit and supplies KIT Dispense based on patient and insurance preference. Use up to four times daily as directed. 03/13/20   Geradine Girt, DO  Insulin Pen Needle (NOVOFINE PEN NEEDLE) 32G X 6 MM MISC Use to inject insulin daily as directed. 04/13/20   Karamalegos, Devonne Doughty, DO    Current Facility-Administered Medications:    0.9 %  sodium chloride infusion, , Intravenous, Continuous, Agbata, Tochukwu, MD, Last Rate: 100 mL/hr at 06/19/20 0630, New Bag at 06/19/20 0630   atenolol (TENORMIN) tablet 50 mg, 50 mg, Oral, Daily, Agbata, Tochukwu, MD, 50 mg at 06/19/20 0907   enoxaparin (LOVENOX) injection 40 mg, 40 mg, Subcutaneous, Q24H, Agbata, Tochukwu, MD, 40 mg at 06/18/20 2101   feeding  supplement (ENSURE ENLIVE / ENSURE PLUS) liquid 237 mL, 237 mL, Oral, BID BM, Mollyann Halbert, Tally Due, MD, 237 mL at 06/19/20 1300   HYDROmorphone (DILAUDID) injection 0.5 mg, 0.5 mg, Intravenous, Q4H PRN, Agbata, Tochukwu, MD, 0.5 mg at 06/19/20 0005   insulin aspart (novoLOG) injection 0-15 Units, 0-15 Units, Subcutaneous, Q4H, Agbata, Tochukwu, MD, 2 Units at 06/19/20 0908   lipase/protease/amylase (CREON) capsule 72,000 Units, 72,000 Units, Oral, TID WC, Mills Koller, RPH, 72,000 Units at 06/19/20 1238   lisinopril (ZESTRIL) tablet 10 mg, 10 mg, Oral, Daily, Agbata, Tochukwu, MD, 10 mg at 06/19/20 0907   ondansetron (ZOFRAN) tablet 4 mg, 4 mg, Oral, Q6H PRN **OR** ondansetron (ZOFRAN) injection 4 mg, 4 mg, Intravenous, Q6H PRN, Agbata, Tochukwu, MD   oxyCODONE (Oxy IR/ROXICODONE) immediate release tablet 5 mg, 5 mg, Oral, Q4H PRN, Sreenath, Sudheer B, MD   pantoprazole (PROTONIX) injection 40 mg, 40 mg, Intravenous, Q24H, Agbata, Tochukwu, MD, 40 mg at 06/18/20 2102  Family History  Problem Relation Age of Onset   Heart disease  Mother    Kidney disease Mother    Diabetes Father    Pancreatic cancer Brother        Agent Orange   Diabetes Paternal Uncle        x 2   Colon cancer Son 29   Esophageal cancer Neg Hx    Inflammatory bowel disease Neg Hx    Liver disease Neg Hx    Stomach cancer Neg Hx      Social History   Tobacco Use   Smoking status: Every Day    Packs/day: 0.25    Pack years: 0.00    Types: Cigarettes    Last attempt to quit: 02/29/2020    Years since quitting: 0.3   Smokeless tobacco: Never  Vaping Use   Vaping Use: Never used  Substance Use Topics   Alcohol use: Not Currently    Alcohol/week: 0.0 standard drinks    Comment: occasionally    Drug use: No    Allergies as of 06/18/2020   (No Known Allergies)    Review of Systems:    All systems reviewed and negative except where noted in HPI.   Physical Exam:  Vital signs in last 24 hours: Temp:   [97.9 F (36.6 C)-99.1 F (37.3 C)] 98.2 F (36.8 C) (06/17 1106) Pulse Rate:  [52-70] 52 (06/17 1106) Resp:  [15-18] 18 (06/17 1106) BP: (116-160)/(59-100) 116/60 (06/17 1106) SpO2:  [93 %-100 %] 95 % (06/17 1106) Weight:  [60.8 kg-61.2 kg] 60.8 kg (06/16 2037) Last BM Date: 06/17/20 General:   Pleasant, cooperative in NAD Head:  Normocephalic and atraumatic. Eyes:   No icterus.   Conjunctiva pink. PERRLA. Ears:  Normal auditory acuity. Neck:  Supple; no masses or thyroidomegaly Lungs: Respirations even and unlabored. Lungs clear to auscultation bilaterally.   No wheezes, crackles, or rhonchi.  Heart:  Regular rate and rhythm;  Without murmur, clicks, rubs or gallops Abdomen:  Soft, nondistended, nontender. Normal bowel sounds. No appreciable masses or hepatomegaly.  No rebound or guarding.  Rectal:  Not performed. Msk:  Symmetrical without gross deformities.  Strength normal Extremities:  Without edema, cyanosis or clubbing. Neurologic:  Alert and oriented x3;  grossly normal neurologically. Skin:  Intact without significant lesions or rashes. Cervical Nodes:  No significant cervical adenopathy. Psych:  Alert and cooperative. Normal affect.  LAB RESULTS: CBC Latest Ref Rng & Units 06/19/2020 06/18/2020 06/10/2020  WBC 4.0 - 10.5 K/uL 7.8 9.1 9.8  Hemoglobin 12.0 - 15.0 g/dL 13.4 15.1(H) 14.5  Hematocrit 36.0 - 46.0 % 40.8 45.8 43.8  Platelets 150 - 400 K/uL 193 228 261.0    BMET BMP Latest Ref Rng & Units 06/19/2020 06/18/2020 06/10/2020  Glucose 70 - 99 mg/dL 125(H) 146(H) 142(H)  BUN 8 - 23 mg/dL 14 15 23   Creatinine 0.44 - 1.00 mg/dL 1.08(H) 1.11(H) 1.14  BUN/Creat Ratio 12 - 28 - - -  Sodium 135 - 145 mmol/L 138 136 136  Potassium 3.5 - 5.1 mmol/L 3.8 4.4 4.1  Chloride 98 - 111 mmol/L 109 102 102  CO2 22 - 32 mmol/L 23 25 28   Calcium 8.9 - 10.3 mg/dL 9.0 9.9 9.9    LFT Hepatic Function Latest Ref Rng & Units 06/18/2020 06/10/2020 04/22/2020  Total Protein 6.5 - 8.1 g/dL 7.5  7.4 7.7  Albumin 3.5 - 5.0 g/dL 4.0 4.1 4.2  AST 15 - 41 U/L 20 16 15   ALT 0 - 44 U/L 14 11 12   Alk Phosphatase 38 - 126 U/L  54 62 71  Total Bilirubin 0.3 - 1.2 mg/dL 0.7 0.4 0.6  Bilirubin, Direct 0.0 - 0.2 mg/dL - - -     STUDIES: CT ABDOMEN PELVIS W CONTRAST  Result Date: 06/18/2020 CLINICAL DATA:  History of pancreatitis.  Worsening abdominal pain. EXAM: CT ABDOMEN AND PELVIS WITH CONTRAST TECHNIQUE: Multidetector CT imaging of the abdomen and pelvis was performed using the standard protocol following bolus administration of intravenous contrast. CONTRAST:  128m OMNIPAQUE IOHEXOL 300 MG/ML  SOLN COMPARISON:  05/27/2020 FINDINGS: Lower chest: Centrilobular emphsyema noted. Hepatobiliary: No suspicious focal abnormality within the liver parenchyma. There is no evidence for gallstones, gallbladder wall thickening, or pericholecystic fluid. No intrahepatic or extrahepatic biliary dilation. Pancreas: As before, there is enhancing pancreatic parenchyma in the head of the pancreas. The organized collection replacing the pancreas through the body and tail has decreased in size in the interval. Measuring on axial imaging, this collection is 9.7 x 3.5 cm today (30/2) which compares to 12.7 x 4.8 cm when hree measuring in a similar fashion on image 21/series 11 of the prior study. As before, there is a small nodular focus of enhancement posteriorly in the right aspect of the lesion also visible on image 30/2. New since the prior study, there is edema/inflammation tracking cranially up between the gastric fundus in the splenic hilum in then posteriorly around the medial aspect of the spleen and up towards the subdiaphragmatic space. This new edema/inflammation dissects posteriorly into the left anterior pararenal space immediately adjacent to the anterior left kidney. Spleen: No splenomegaly. No focal mass lesion. Adrenals/Urinary Tract: No adrenal nodule or mass. Kidneys unremarkable. No evidence for  hydroureter. The urinary bladder appears normal for the degree of distention. Stomach/Bowel: Stomach is unremarkable. No gastric wall thickening. No evidence of outlet obstruction. Duodenum is normally positioned as is the ligament of Treitz. No small bowel wall thickening. No small bowel dilatation. The terminal ileum is normal. The appendix is normal. No gross colonic mass. No colonic wall thickening. Vascular/Lymphatic: There is abdominal aortic atherosclerosis without aneurysm. Infrarenal abdominal aorta measures 3.2 cm diameter. Portal vein and superior mesenteric vein are patent although there is marked mass-effect/attenuation on the portal splenic confluence. Splenic vein patency cannot be confirmed. Celiac axis and SMA are unremarkable. There is no gastrohepatic or hepatoduodenal ligament lymphadenopathy. No retroperitoneal or mesenteric lymphadenopathy. No pelvic sidewall lymphadenopathy. Reproductive: The uterus is unremarkable.  There is no adnexal mass. Other: No substantial intraperitoneal free fluid. Musculoskeletal: No worrisome lytic or sclerotic osseous abnormality. IMPRESSION: 1. Interval decrease in size of the organized collection replacing the pancreas through the body and tail. There is new edema/inflammation tracking cranially up between the gastric fundus in the splenic hilum and then posteriorly around the medial aspect of the spleen and up towards the subdiaphragmatic space. This new edema/inflammation dissects posteriorly into the left anterior pararenal space immediately adjacent to the anterior left kidney. Imaging features suggest acute on chronic pancreatitis 2. Splenic vein patency cannot be confirmed. Portal vein and superior mesenteric vein are patent although there is marked mass-effect/attenuation on the portal splenic confluence. 3. 3.2 cm infrarenal abdominal aortic diameter. Recommend follow-up every 3 years. This recommendation follows ACR consensus guidelines: White Paper of  the ACR Incidental Findings Committee II on Vascular Findings. J Am Coll Radiol 2013;; [id number] 4. Aortic Atherosclerosis (ICD10-I70.0). Electronically Signed   By: EMisty StanleyM.D.   On: 06/18/2020 17:21      Impression / Plan:   Holly DONOVANis a 75  y.o. 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  Overall, serum lipase levels are within normal limits, CT abdomen and pelvis revealed decreased size of the pseudocyst of the pancreas to 9.7 x 3.5 cm associated with edema/inflammation tracking cranially and posteriorly surrounding the pancreas raising the suspicion for acute pancreatitis.    Patient's symptoms have resolved at this time Continue IV fluids 100 mL/h for now No evidence of AKI Okay to give full liquid diet challenge, if patient remains asymptomatic she can be discharged home today Restart Creon 72K 3 times daily Patient should follow-up with Dr. Rush Landmark upon discharge  Thank you for involving me in the care of this patient.      LOS: 1 day   Sherri Sear, MD  06/19/2020, 1:05 PM    Note: This dictation was prepared with Dragon dictation along with smaller phrase technology. Any transcriptional errors that result from this process are unintentional.

## 2020-06-19 NOTE — Plan of Care (Signed)
  Problem: Clinical Measurements: Goal: Ability to maintain clinical measurements within normal limits will improve Outcome: Progressing Goal: Will remain free from infection Outcome: Progressing Goal: Diagnostic test results will improve Outcome: Progressing Goal: Respiratory complications will improve Outcome: Progressing Goal: Cardiovascular complication will be avoided Outcome: Progressing   Problem: Pain Managment: Goal: General experience of comfort will improve Outcome: Progressing   Pt is involved in and agrees with the plan of care. V/S stable except HR is 50s. Complained of upper abdominal pain; Dilaudid IV given for pain relief. Denies SOB.

## 2020-06-19 NOTE — Discharge Summary (Signed)
Physician Discharge Summary  Holly Navarro BHA:[id number] DOB: 04/26/1945 DOA: 06/18/2020  PCP: Jearld Fenton, NP  Admit date: 06/18/2020 Discharge date: 06/19/2020  Admitted From: Home Disposition: Home  Recommendations for Outpatient Follow-up:  Follow up with PCP in 1-2 weeks Follow-up GI as directed  Home Health: No Equipment/Devices: None  Discharge Condition: Stable CODE STATUS: Full Diet recommendation:  full liquid  Brief/Interim Summary: 75 y.o. 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  She rates her pain a 6 x 10 in intensity at its worst and denies having any nausea, no vomiting, no fever, no chills, no chest pain, no shortness of breath, no cough, no urinary symptoms or any changes in her bowel habits. Her symptoms started this morning when she woke up and was not related to meals. Labs show sodium 136, potassium 4.4, chloride 102, bicarb 25, glucose 146, BUN 15, creatinine 1.1, calcium 9.9, alkaline phosphatase 54, albumin 4.0, lipase 47, AST 20, ALT 14, total protein 7.5, white count 9.1, hemoglobin 15.1, hematocrit 45.8, MCV 89.1, RDW 13.2, platelet count 228, Respiratory viral panel is pending CT scan of abdomen and pelvis with contrast  Shows interval decrease in size of the organized collection replacing the pancreas through the body and tail. There is new edema/inflammation tracking cranially up between the gastric fundus in the splenic hilum and then posteriorly around the medial aspect of the spleen and up towards the subdiaphragmatic space. This new edema/inflammation dissects  posteriorly into the left anterior pararenal space immediately adjacent to the anterior left kidney. Imaging features suggest acute on chronic pancreatitis  Patient was seen in consultation by gastroenterology who is familiar with the patient from the clinic setting.  Per GI patient is symptomatically much improved.  Findings could be representative of a ruptured pseudocyst rather than exacerbation of underlying pancreatitis.  Patient is tolerating full liquid diet without issue.  No nausea vomiting or abdominal pain endorsed.  Patient will discharge home with close follow-up with her established gastroenterologist.  She is advised to continue with a full liquid diet and resume her home Creon.  Discharge Diagnoses:  Principal Problem:   Acute on chronic pancreatitis [facility]) Active Problems:   Essential hypertension   Hypertensive kidney disease with CKD stage III (HCC)   Abdominal aortic aneurysm (AAA) without rupture (HCC)   Type 2 diabetes mellitus without complication ([place])   Abdominal pain  Abdominal pain Initially concerning for acute on chronic pancreatitis.  Abdominal pain resolved and lipase negative.  Patient tolerating full liquid diet.  Stable for discharge at this time.  Case discussed with gastroenterology.  Will advise to continue for liquid diet, resume home Creon, follow-up with gastroenterology as directed.  Discharge Instructions  Discharge Instructions     Diet - low sodium heart healthy   Complete by: As directed    Increase activity slowly   Complete by: As directed       Allergies as of 06/19/2020   No Known Allergies      Medication List     TAKE these medications    atenolol 50 MG tablet Commonly known as: TENORMIN Take 1 tablet (50 mg total) by mouth daily.   blood glucose  meter kit and supplies Kit Dispense based on patient and insurance preference. Use up to four times daily as directed.   co-enzyme Q-10 30 MG capsule Take 30 mg by mouth 3  (three) times daily.   ECHINACEA PO Take 900 mg by mouth 2 (two) times daily.   fluticasone 50 MCG/ACT nasal spray Commonly known as: FLONASE Place 2 sprays into both nostrils daily.   lisinopril 10 MG tablet Commonly known as: ZESTRIL Take 1 tablet (10 mg total) by mouth daily.   Novofine Pen Needle 32G X 6 MM Misc Generic drug: Insulin Pen Needle Use to inject insulin daily as directed.   OVER THE COUNTER MEDICATION Take 1 capsule by mouth 2 (two) times daily. [place] Formula   pantoprazole 40 MG tablet Commonly known as: PROTONIX Take 1 tablet (40 mg total) by mouth at bedtime.   Red Yeast Rice 600 MG Caps Take 600 mg by mouth 2 (two) times daily.   simethicone 80 MG chewable tablet Commonly known as: MYLICON Chew 80 mg by mouth in the morning, at noon, in the evening, and at bedtime.       ASK your doctor about these medications    lipase/protease/amylase 36000 UNITS Cpep capsule Commonly known as: CREON Take 2 capsules (72,000 Units total) by mouth 3 (three) times daily with meals. Take 2 capsules (72,000 Units total) by mouth 3 (three) times daily with meals and 36000 with snack   Tresiba FlexTouch 100 UNIT/ML FlexTouch Pen Generic drug: insulin degludec Inject 7 Units into the skin daily. Adjust dose as advised.        No Known Allergies  Consultations: GI   Procedures/Studies: CT ABDOMEN PELVIS W CONTRAST  Result Date: 06/18/2020 CLINICAL DATA:  History of pancreatitis.  Worsening abdominal pain. EXAM: CT ABDOMEN AND PELVIS WITH CONTRAST TECHNIQUE: Multidetector CT imaging of the abdomen and pelvis was performed using the standard protocol following bolus administration of intravenous contrast. CONTRAST:  142m OMNIPAQUE IOHEXOL 300 MG/ML  SOLN COMPARISON:  05/27/2020 FINDINGS: Lower chest: Centrilobular emphsyema noted. Hepatobiliary: No suspicious focal abnormality within the liver parenchyma. There is no evidence for  gallstones, gallbladder wall thickening, or pericholecystic fluid. No intrahepatic or extrahepatic biliary dilation. Pancreas: As before, there is enhancing pancreatic parenchyma in the head of the pancreas. The organized collection replacing the pancreas through the body and tail has decreased in size in the interval. Measuring on axial imaging, this collection is 9.7 x 3.5 cm today (30/2) which compares to 12.7 x 4.8 cm when hree measuring in a similar fashion on image 21/series 11 of the prior study. As before, there is a small nodular focus of enhancement posteriorly in the right aspect of the lesion also visible on image 30/2. New since the prior study, there is edema/inflammation tracking cranially up between the gastric fundus in the splenic hilum in then posteriorly around the medial aspect of the spleen and up towards the subdiaphragmatic space. This new edema/inflammation dissects posteriorly into the left anterior pararenal space immediately adjacent to the anterior left kidney. Spleen: No splenomegaly. No focal mass lesion. Adrenals/Urinary Tract: No adrenal nodule or mass. Kidneys unremarkable. No evidence for hydroureter. The urinary bladder appears normal for the degree of distention. Stomach/Bowel: Stomach is unremarkable. No gastric wall thickening. No evidence of outlet obstruction. Duodenum is normally positioned as is the ligament of Treitz. No small bowel wall thickening. No small bowel dilatation. The terminal ileum is normal. The appendix is normal. No gross colonic mass.  No colonic wall thickening. Vascular/Lymphatic: There is abdominal aortic atherosclerosis without aneurysm. Infrarenal abdominal aorta measures 3.2 cm diameter. Portal vein and superior mesenteric vein are patent although there is marked mass-effect/attenuation on the portal splenic confluence. Splenic vein patency cannot be confirmed. Celiac axis and SMA are unremarkable. There is no gastrohepatic or hepatoduodenal ligament  lymphadenopathy. No retroperitoneal or mesenteric lymphadenopathy. No pelvic sidewall lymphadenopathy. Reproductive: The uterus is unremarkable.  There is no adnexal mass. Other: No substantial intraperitoneal free fluid. Musculoskeletal: No worrisome lytic or sclerotic osseous abnormality. IMPRESSION: 1. Interval decrease in size of the organized collection replacing the pancreas through the body and tail. There is new edema/inflammation tracking cranially up between the gastric fundus in the splenic hilum and then posteriorly around the medial aspect of the spleen and up towards the subdiaphragmatic space. This new edema/inflammation dissects posteriorly into the left anterior pararenal space immediately adjacent to the anterior left kidney. Imaging features suggest acute on chronic pancreatitis 2. Splenic vein patency cannot be confirmed. Portal vein and superior mesenteric vein are patent although there is marked mass-effect/attenuation on the portal splenic confluence. 3. 3.2 cm infrarenal abdominal aortic diameter. Recommend follow-up every 3 years. This recommendation follows ACR consensus guidelines: White Paper of the ACR Incidental Findings Committee II on Vascular Findings. J Am Coll Radiol 2013; 01:749-449. 4. Aortic Atherosclerosis (ICD10-I70.0). Electronically Signed   By: Misty Stanley M.D.   On: 06/18/2020 17:21   CT ABDOMEN W WO CONTRAST  Result Date: 05/28/2020 CLINICAL DATA:  Pancreatic cyst, history of pancreatitis. EXAM: CT ABDOMEN WITHOUT AND WITH CONTRAST TECHNIQUE: Multidetector CT imaging of the abdomen was performed following the standard protocol before and following the bolus administration of intravenous contrast. CONTRAST:  61m OMNIPAQUE IOHEXOL 300 MG/ML  SOLN COMPARISON:  Multiple prior studies most recent of April 14, 2020. FINDINGS: Lower chest: Pulmonary emphysema.  No effusion.  No consolidation. Hepatobiliary: Portal vein is patent into the liver though narrowed at the  splenic-portal confluence. Splenic vein is occluded with short gastric and LEFT gastric collaterals. No focal, suspicious hepatic lesion. No pericholecystic stranding. No sign of biliary duct dilation. Pancreas: Small amount of normal enhancing pancreatic tissue in the head of the pancreas. Area of walled off necrosis in the bed of the pancreas, initial scan from March 08, 2020 showed acute necrotic changes and peripancreatic as well as glandular necrosis. This area has contracted and is more defined currently than on the initial CT appearing similar to the previous MRI evaluation. Area measuring 12.1 x 5.1 cm. Small portions of this extend into the transverse mesocolon but this is very well marginated for the most part and displays little if any internal enhancement. No sign of current adjacent stranding. Spleen: Normal size.  Mildly lobulated.  Adjacent splenule. Adrenals/Urinary Tract: Adrenal glands are normal. Symmetric renal enhancement with signs of renal cortical scarring. No hydronephrosis or perinephric stranding. Stomach/Bowel: No acute gastrointestinal finding to the extent evaluated on abdominal CT. Vascular/Lymphatic: 3.1 x 2.8 cm infrarenal abdominal aortic dilation. More normal proximal abdominal aorta 1.9 cm. Calcified and noncalcified plaque throughout the abdominal aorta. Area of walled off necrosis partially encases splenic vasculature and occludes the splenic vein, narrowing the splenic portal confluence. Other: There is no gastrohepatic or hepatoduodenal ligament lymphadenopathy. No retroperitoneal or mesenteric lymphadenopathy. No ascites. Musculoskeletal: No acute musculoskeletal process spinal degenerative changes. IMPRESSION: 1. Large area of walled off necrosis at site of previous glandular and peripancreatic necrosis without signs of gas formation but with evidence of splenic venous  occlusion and portal venous narrowing. Splenic artery also in close proximity to the above process which is  little changed since April 14, 2020. 2. No signs of acute inflammation 3. Developing collateral pathways in the upper abdomen related to splenic venous occlusion. Attention on follow-up to portal venous structures is suggested. Appearance is little changed since April MRI. 4. Pulmonary emphysema. 5. Mild aneurysmal dilation of the infrarenal abdominal aorta maximal caliber of 3.1 cm. Recommend follow-up every 3 years. This recommendation follows ACR consensus guidelines: White Paper of the ACR Incidental Findings Committee II on Vascular Findings. J Am Coll Radiol 2013; 10:789-794. 6. Emphysema and aortic atherosclerosis. Aortic Atherosclerosis (ICD10-I70.0) and Emphysema (ICD10-J43.9). Electronically Signed   By: Zetta Bills M.D.   On: 05/28/2020 16:51   (Echo, Carotid, EGD, Colonoscopy, ERCP)    Subjective: Patient seen and examined on the day of discharge.  Stable, no distress.  Tolerating the liquid diet.  Stable for discharge home.  Discharge Exam: Vitals:   06/19/20 0909 06/19/20 1106  BP: 117/61 116/60  Pulse: 60 (!) 52  Resp: 18 18  Temp: 98.1 F (36.7 C) 98.2 F (36.8 C)  SpO2: 93% 95%   Vitals:   06/18/20 2357 06/19/20 0351 06/19/20 0909 06/19/20 1106  BP: (!) 123/59 125/70 117/61 116/60  Pulse: (!) 57 (!) 56 60 (!) 52  Resp: _0 Temp: 99.1 F (37.3 C) 97.9 F (36.6 C) 98.1 F (36.7 C) 98.2 F (36.8 C)  TempSrc: Oral  Oral Oral  SpO2: 93% 93% 93% 95%  Weight:      Height:        General: Pt is alert, awake, not in acute distress Cardiovascular: RRR, S1/S2 +, no rubs, no gallops Respiratory: CTA bilaterally, no wheezing, no rhonchi Abdominal: Soft, NT, ND, bowel sounds + Extremities: no edema, no cyanosis    The results of significant diagnostics from this hospitalization (including imaging, microbiology, ancillary and laboratory) are listed below for reference.     Microbiology: Recent Results (from the past 240 hour(s))  SARS CORONAVIRUS 2 (TAT  6-24 HRS) Nasopharyngeal Nasopharyngeal Swab     Status: None   Collection Time: 06/18/20  6:02 PM   Specimen: Nasopharyngeal Swab  Result Value Ref Range Status   SARS Coronavirus 2 NEGATIVE NEGATIVE Final    Comment: (NOTE) SARS-CoV-2 target nucleic acids are NOT DETECTED.  The SARS-CoV-2 RNA is generally detectable in upper and lower respiratory specimens during the acute phase of infection. Negative results do not preclude SARS-CoV-2 infection, do not rule out co-infections with other pathogens, and should not be used as the sole basis for treatment or other patient management decisions. Negative results must be combined with clinical observations, patient history, and epidemiological information. The expected result is Negative.  Fact Sheet for Patients: SugarRoll.be  Fact Sheet for Healthcare Providers: https://www.woods-mathews.com/  This test is not yet approved or cleared by the [place] FDA and  has been authorized for detection and/or diagnosis of SARS-CoV-2 by FDA under an Emergency Use Authorization (EUA). This EUA will remain  in effect (meaning this test can be used) for the duration of the COVID-19 declaration under Se ction 564(b)(1) of the Act, 21 U.S.C. section 360bbb-3(b)(1), unless the authorization is terminated or revoked sooner.  Performed at [facility], [place] [address], [place], [place] [address]      Labs: BNP (last 3 results) Recent Labs    03/07/20 0454  BNP 093.8*   Basic Metabolic Panel: Recent  Labs  Lab 06/18/20 1510 06/19/20 0444  NA 136 138  K 4.4 3.8  CL 102 109  CO2 25 23  GLUCOSE 146* 125*  BUN 15 14  CREATININE 1.11* 1.08*  CALCIUM 9.9 9.0   Liver Function Tests: Recent Labs  Lab 06/18/20 1510  AST 20  ALT 14  ALKPHOS 54  BILITOT 0.7  PROT 7.5  ALBUMIN 4.0   Recent Labs  Lab 06/18/20 1510  LIPASE 47   No results for input(s): AMMONIA in the last 168  hours. CBC: Recent Labs  Lab 06/18/20 1510 06/19/20 0444  WBC 9.1 7.8  HGB 15.1* 13.4  HCT 45.8 40.8  MCV 89.1 87.7  PLT 228 193   Cardiac Enzymes: No results for input(s): CKTOTAL, CKMB, CKMBINDEX, TROPONINI in the last 168 hours. BNP: Invalid input(s): POCBNP CBG: Recent Labs  Lab 06/18/20 2047 06/18/20 2358 06/19/20 0354 06/19/20 0730 06/19/20 1155  GLUCAP 101* 122* 115* 134* 75   D-Dimer No results for input(s): DDIMER in the last 72 hours. Hgb A1c No results for input(s): HGBA1C in the last 72 hours. Lipid Profile No results for input(s): CHOL, HDL, LDLCALC, TRIG, CHOLHDL, LDLDIRECT in the last 72 hours. Thyroid function studies No results for input(s): TSH, T4TOTAL, T3FREE, THYROIDAB in the last 72 hours.  Invalid input(s): FREET3 Anemia work up No results for input(s): VITAMINB12, FOLATE, FERRITIN, TIBC, IRON, RETICCTPCT in the last 72 hours. Urinalysis    Component Value Date/Time   COLORURINE YELLOW (A) 06/18/2020 1450   APPEARANCEUR HAZY (A) 06/18/2020 1450   LABSPEC >1.046 (H) 06/18/2020 1450   PHURINE 6.0 06/18/2020 1450   GLUCOSEU NEGATIVE 06/18/2020 1450   HGBUR NEGATIVE 06/18/2020 1450   BILIRUBINUR NEGATIVE 06/18/2020 1450   KETONESUR NEGATIVE 06/18/2020 1450   PROTEINUR NEGATIVE 06/18/2020 1450   NITRITE NEGATIVE 06/18/2020 1450   LEUKOCYTESUR NEGATIVE 06/18/2020 1450   Sepsis Labs Invalid input(s): PROCALCITONIN,  WBC,  LACTICIDVEN Microbiology Recent Results (from the past 240 hour(s))  SARS CORONAVIRUS 2 (TAT 6-24 HRS) Nasopharyngeal Nasopharyngeal Swab     Status: None   Collection Time: 06/18/20  6:02 PM   Specimen: Nasopharyngeal Swab  Result Value Ref Range Status   SARS Coronavirus 2 NEGATIVE NEGATIVE Final    Comment: (NOTE) SARS-CoV-2 target nucleic acids are NOT DETECTED.  The SARS-CoV-2 RNA is generally detectable in upper and lower respiratory specimens during the acute phase of infection. Negative results do not  preclude SARS-CoV-2 infection, do not rule out co-infections with other pathogens, and should not be used as the sole basis for treatment or other patient management decisions. Negative results must be combined with clinical observations, patient history, and epidemiological information. The expected result is Negative.  Fact Sheet for Patients: SugarRoll.be  Fact Sheet for Healthcare Providers: https://www.woods-mathews.com/  This test is not yet approved or cleared by the [place] FDA and  has been authorized for detection and/or diagnosis of SARS-CoV-2 by FDA under an Emergency Use Authorization (EUA). This EUA will remain  in effect (meaning this test can be used) for the duration of the COVID-19 declaration under Se ction 564(b)(1) of the Act, 21 U.S.C. section 360bbb-3(b)(1), unless the authorization is terminated or revoked sooner.  Performed at [facility], [place] [address] [place], [place] [address]      Time coordinating discharge: Over 30 minutes  SIGNED:   Sidney Ace, MD  [facility] 06/19/2020, 2:08 PM Pager   If 7PM-7AM, please contact night-coverage

## 2020-06-19 NOTE — Care Management CC44 (Signed)
Condition Code 44 Documentation Completed  Patient Details  Name: DRINA JOBST MRN: [id number] Date of Birth: 09-19-1945   Condition Code 44 given:  Yes Patient signature on Condition Code 44 notice:  Yes Documentation of 2 MD's agreement:  Yes Code 44 added to claim:  Yes  Patient gave permission for CSW to review via phone. Printed copy provided for patient's records.  Jovann Luse E Brecklynn Jian, LCSW 06/19/2020, 2:11 PM

## 2020-06-22 NOTE — Telephone Encounter (Signed)
Patient called requesting an appointment with Dr. Meridee Score since she was just seen at the ER however the next available appointment with him is in September patient would like to come in sooner with him please advise.

## 2020-06-22 NOTE — Telephone Encounter (Signed)
Pt calling wanting to schedule an appt with Dr. Meridee Score but wants to be seen sooner than 1st available. Please advise if you want her worked in somewhere sooner or scheduled with an app.

## 2020-06-23 NOTE — Telephone Encounter (Signed)
Pt scheduled to see Dr. Meridee Score 07/24/20 at 10:50am.

## 2020-06-23 NOTE — Telephone Encounter (Signed)
Please work the patient into clinic, within 1 to 2 weeks of my return from my scheduled leave.  Thanks. GM

## 2020-06-23 NOTE — Telephone Encounter (Signed)
Spoke with patient, she wanted to discuss her scheduled appt. Advsied patient that she had been scheduled for a follow up on 07/24/20. Patient did not want to keep appt, she states that she will have the CT scan and go from there.

## 2020-07-01 ENCOUNTER — Ambulatory Visit: Admission: RE | Admit: 2020-07-01 | Payer: Medicare Other | Source: Ambulatory Visit

## 2020-07-10 ENCOUNTER — Telehealth: Payer: Self-pay

## 2020-07-10 DIAGNOSIS — K8689 Other specified diseases of pancreas: Secondary | ICD-10-CM

## 2020-07-10 DIAGNOSIS — E7849 Other hyperlipidemia: Secondary | ICD-10-CM

## 2020-07-10 DIAGNOSIS — K862 Cyst of pancreas: Secondary | ICD-10-CM

## 2020-07-10 NOTE — Telephone Encounter (Signed)
ROV scheduled for 07/22/20 at 930 am with GM   Labs have been entered   CT scan scheduled for 07/20/20 at 1030 am at [facility] arrive at the [facility] entrance.    The pt has been advised of the above information.  She will have labs prior to CT and appt.

## 2020-07-10 NOTE — Telephone Encounter (Signed)
Mansouraty, Telford Nab., MD  You; Christy Sartorius, Real Cons, LPN 8 hours ago (49:44 AM)     Please set up Holly Navarro follow up in clinic next week or the week after (OK to overbook).  Patient needs Holly Navarro CT-Abdomen with IV contrast to be performed Holly Navarro few days prior to look at the pancreatic cyst/necrosis.  She should have labs performed as well (CBC/CRP/ESR/CMP/Lipase/Amylase).  Thanks.  GM    Mansouraty, Telford Nab., MD  Holly Holly Navarro 8 hours ago (12:16 AM)      Sorry to hear this Holly Holly Navarro. Before we think that things have worsened we should get updated imaging. We will try to get you set up for Holly Navarro follow up in clinic next week or the week after with Holly Navarro CT scan done at [facility] within 1-2 days of the clinic visit. If you develop fevers/chills you need to let us know as we will likely need to put you on antibiotics and expedite the CT scan and/or send you to the hospital. Good luck and good health.   Justice Britain, MD   This MyChart message has not been read.   McKew, Real Cons, LPN routed conversation to Mansouraty, Telford Nab., MD 3 days ago   Holly Holly Navarro  Mansouraty, Telford Nab., MD 3 days ago      I have been having Holly Navarro lot of mild pain with much tenderness in pancreatic area. It feels swollen (pillowy) like it maybe the cyst filling up and pancreas swelling. I have only eaten soft foods like jello, applesauce, pudding and egg. The protein drinks fill me up too quickly. My weight has dropped to 129.8 lbs.  I don't feel badly, just worried. Please advise.

## 2020-07-14 ENCOUNTER — Ambulatory Visit (INDEPENDENT_AMBULATORY_CARE_PROVIDER_SITE_OTHER): Payer: Medicare Other | Admitting: Internal Medicine

## 2020-07-14 ENCOUNTER — Other Ambulatory Visit: Payer: Self-pay

## 2020-07-14 ENCOUNTER — Encounter: Payer: Self-pay | Admitting: Internal Medicine

## 2020-07-14 VITALS — BP 129/66 | HR 59 | Temp 97.1°F | Resp 17 | Ht 65.0 in | Wt 128.8 lb

## 2020-07-14 DIAGNOSIS — K859 Acute pancreatitis without necrosis or infection, unspecified: Secondary | ICD-10-CM | POA: Diagnosis not present

## 2020-07-14 DIAGNOSIS — K861 Other chronic pancreatitis: Secondary | ICD-10-CM | POA: Diagnosis not present

## 2020-07-14 DIAGNOSIS — I700 Atherosclerosis of aorta: Secondary | ICD-10-CM

## 2020-07-14 NOTE — Patient Instructions (Signed)
Pancreatitis Eating Plan ?Pancreatitis is when your pancreas becomes irritated and swollen (inflamed). The pancreas is a small organ located behind your stomach. It helps your body digest food and regulate your blood sugar. Pancreatitis can affect how your body digests food, especially foods with fat. You may also have other symptoms such as abdominal pain or nausea. ?When you have pancreatitis, following a low-fat eating plan may help you manage symptoms and recover more quickly. Work with your health care provider or a diet and nutrition specialist (dietitian) to create an eating plan that is right for you. ?What are tips for following this plan? ?Reading food labels ?Use the information on food labels to help keep track of how much fat you eat: ?Check the serving size. ?Look for the amount of total fat in grams (g) in one serving. ?Low-fat foods have 3 g of fat or less per serving. ?Fat-free foods have 0.5 g of fat or less per serving. ?Keep track of how much fat you eat based on how many servings you eat. ?For example, if you eat two servings, the amount of fat you eat will be two times what is listed on the label. ?Shopping ? ?Buy low-fat or nonfat foods, such as: ?Fresh, frozen, or canned fruits and vegetables. ?Grains, including pasta, bread, and rice. ?Lean meat, poultry, fish, and other protein foods. ?Low-fat or nonfat dairy. ?Avoid buying bakery products and other sweets made with whole milk, butter, and eggs. ?Avoid buying snack foods with added fat, such as anything with butter or cheese flavoring. ?Cooking ?Remove skin from poultry, and remove extra fat from meat. ?Limit the amount of fat and oil you use to 6 teaspoons or less per day. ?Cook using low-fat methods, such as boiling, broiling, grilling, steaming, or baking. ?Use spray oil to cook. Add fat-free chicken broth to add flavor and moisture. ?Avoid adding cream to thicken soups or sauces. Use other thickeners such as corn starch or tomato  paste. ?Meal planning ? ?Eat a low-fat diet as told by your dietitian. For most people, this means having no more than 55-65 grams of fat each day. ?Eat small, frequent meals throughout the day. For example, you may have 5-6 small meals instead of 3 large meals. ?Drink enough fluid to keep your urine pale yellow. ?Do not drink alcohol. Talk to your health care provider if you need help stopping. ?Limit how much caffeine you have, including black coffee, black and green tea, caffeinated soft drinks, and energy drinks. ?General information ?Let your health care provider or dietitian know if you have unplanned weight loss on this eating plan. ?You may be instructed to follow a clear liquid diet during a flare of symptoms. Talk with your health care provider about how to manage your diet during symptoms of a flare. ?Take any vitamins or supplements as told by your health care provider. ?Work with a dietitian, especially if you have other conditions such as obesity or diabetes mellitus. ?What foods should I avoid? ?Fruits ?Fried fruits. Fruits served with butter or cream. ?Vegetables ?Fried vegetables. Vegetables cooked with butter, cheese, or cream. ?Grains ?Biscuits, waffles, donuts, pastries, and croissants. Pies and cookies. Butter-flavored popcorn. Regular crackers. ?Meats and other protein foods ?Fatty cuts of meat. Poultry with skin. Organ meats. Bacon, sausage, and cold cuts. Whole eggs. Nuts and nut butters. ?Dairy ?Whole and 2% milk. Whole milk yogurt. Whole milk ice cream. Cream and half-and-half. Cream cheese. Sour cream. Cheese. ?Beverages ?Wine, beer, and liquor. ?The items listed above may   not be a complete list of foods and beverages to avoid. Contact a dietitian for more information. ?Summary ?Pancreatitis can affect how your body digests food, especially foods with fat. ?When you have pancreatitis, it is recommended that you follow a low-fat eating plan to help you recover more quickly and manage  symptoms. For most people, this means limiting fat to no more than 55-65 grams per day. ?Do not drink alcohol. Limit the amount of caffeine you have, and drink enough fluid to keep your urine pale yellow. ?This information is not intended to replace advice given to you by your health care provider. Make sure you discuss any questions you have with your health care provider. ?Document Revised: 04/12/2018 Document Reviewed: 03/28/2017 ?Elsevier Patient Education ? 2022 [employer] ? ?

## 2020-07-14 NOTE — Progress Notes (Signed)
Subjective:    Patient ID: Holly Navarro, female    DOB: 07/01/1945, 75 y.o.   MRN: [id number]  HPI  Patient presents to clinic today for hospital follow-up.  She presented to the ER 6/16 with complaint of upper abdominal pain.  Labs were essentially unremarkable.  CT abdomen pelvis showed:  IMPRESSION: 1. Interval decrease in size of the organized collection replacing the pancreas through the body and tail. There is new edema/inflammation tracking cranially up between the gastric fundus in the splenic hilum and then posteriorly around the medial aspect of the spleen and up towards the subdiaphragmatic space. This new edema/inflammation dissects posteriorly into the left anterior pararenal space immediately adjacent to the anterior left kidney. Imaging features suggest acute on chronic pancreatitis 2. Splenic vein patency cannot be confirmed. Portal vein and superior mesenteric vein are patent although there is marked mass-effect/attenuation on the portal splenic confluence. 3. 3.2 cm infrarenal abdominal aortic diameter. Recommend follow-up every 3 years. . 4. Aortic Atherosclerosis (ICD10-I70.0).  GI was consulted.  She has already had significant improvement in her symptoms and was tolerating p.o.  They felt that this likely could have represented a ruptured pseudocyst.  No medications were changed.  They recommended discharge and follow-up with PCP and GI as an outpatient.  She was discharged 6/17.  Since discharge, she denies abdominal pain, nausea, vomiting, diarrhea at this time. She has an a follow up with GI planned.  Review of Systems  Past Medical History:  Diagnosis Date   Anemia    Anxiety    Arthritis    Cardiac arrhythmia    Hyperlipidemia    Hypertension    Pancreatic pseudocyst    Seizure (HCC)    Stomach ulcer     Current Outpatient Medications  Medication Sig Dispense Refill   atenolol (TENORMIN) 50 MG tablet Take 1 tablet (50 mg total) by mouth daily. 90 tablet  1   blood glucose meter kit and supplies KIT Dispense based on patient and insurance preference. Use up to four times daily as directed. 1 each 0   co-enzyme Q-10 30 MG capsule Take 30 mg by mouth 3 (three) times daily.     ECHINACEA PO Take 900 mg by mouth 2 (two) times daily.     fluticasone (FLONASE) 50 MCG/ACT nasal spray Place 2 sprays into both nostrils daily. 15.8 mL 2   Insulin Pen Needle (NOVOFINE PEN NEEDLE) 32G X 6 MM MISC Use to inject insulin daily as directed. 90 each 1   lipase/protease/amylase (CREON) 36000 UNITS CPEP capsule Take 2 capsules (72,000 Units total) by mouth 3 (three) times daily with meals. Take 2 capsules (72,000 Units total) by mouth 3 (three) times daily with meals and 36000 with snack (Patient taking differently: Take 72,000 Units by mouth See admin instructions. Take 2 capsules (72,000 Units total) by mouth 3 (three) times daily with meals and 36000 with snack) 270 capsule 1   lisinopril (ZESTRIL) 10 MG tablet Take 1 tablet (10 mg total) by mouth daily. 90 tablet 1   OVER THE COUNTER MEDICATION Take 1 capsule by mouth 2 (two) times daily. [place] Formula     pantoprazole (PROTONIX) 40 MG tablet Take 1 tablet (40 mg total) by mouth at bedtime. 90 tablet 1   Red Yeast Rice 600 MG CAPS Take 600 mg by mouth 2 (two) times daily.     simethicone (MYLICON) 80 MG chewable tablet Chew 80 mg by mouth in the morning,  at noon, in the evening, and at bedtime.     TRESIBA FLEXTOUCH 100 UNIT/ML FlexTouch Pen Inject 7 Units into the skin daily. Adjust dose as advised. (Patient taking differently: Inject 8 Units into the skin daily. Adjust dose as advised.) 3 mL 2   No current facility-administered medications for this visit.    No Known Allergies  Family History  Problem Relation Age of Onset   Heart disease Mother    Kidney disease Mother    Diabetes Father    Pancreatic cancer Brother        Agent Orange   Diabetes Paternal Uncle        x 2    Colon cancer Son 64   Esophageal cancer Neg Hx    Inflammatory bowel disease Neg Hx    Liver disease Neg Hx    Stomach cancer Neg Hx     Social History   Socioeconomic History   Marital status: Widowed    Spouse name: Not on file   Number of children: 4   Years of education: Not on file   Highest education level: Not on file  Occupational History   Occupation: retired  Tobacco Use   Smoking status: Every Day    Packs/day: 0.25    Pack years: 0.00    Types: Cigarettes    Last attempt to quit: 02/29/2020    Years since quitting: 0.3   Smokeless tobacco: Never  Vaping Use   Vaping Use: Never used  Substance and Sexual Activity   Alcohol use: Not Currently    Alcohol/week: 0.0 standard drinks    Comment: occasionally    Drug use: No   Sexual activity: Not on file  Other Topics Concern   Not on file  Social History Narrative   Not on file   Social Determinants of Health   Financial Resource Strain: Not on file  Food Insecurity: Not on file  Transportation Needs: Not on file  Physical Activity: Not on file  Stress: Not on file  Social Connections: Not on file  Intimate Partner Violence: Not on file     Constitutional: Denies fever, malaise, fatigue, headache or abrupt weight changes.  Respiratory: Denies difficulty breathing, shortness of breath, cough or sputum production.   Cardiovascular: Denies chest pain, chest tightness, palpitations or swelling in the hands or feet.  Gastrointestinal: Denies abdominal pain, bloating, constipation, diarrhea or blood in the stool.  Neurological: Denies dizziness, difficulty with memory, difficulty with speech or problems with balance and coordination.    No other specific complaints in a complete review of systems (except as listed in HPI above).     Objective:   Physical Exam  BP 129/66 (BP Location: Left Arm, Patient Position: Sitting, Cuff Size: Small)   Pulse (!) 59   Temp (!) 97.1 F (36.2 C) (Temporal)   Resp 17    Ht 5' 5"  (1.651 m)   Wt 128 lb 12.8 oz (58.4 kg)   SpO2 100%   BMI 21.43 kg/m   Wt Readings from Last 3 Encounters:  06/18/20 134 lb (60.8 kg)  06/10/20 134 lb 4 oz (60.9 kg)  06/10/20 132 lb (59.9 kg)    General: Appears her stated age, well developed, well nourished in NAD. Cardiovascular: Bradycardic with normal rhythm. S1,S2 noted.  No murmur, rubs or gallops noted.  Pulmonary/Chest: Normal effort and positive vesicular breath sounds. No respiratory distress. No wheezes, rales or ronchi noted.  Abdomen: Soft and nontender. Normal bowel sounds. No distention  or masses noted.  Musculoskeletal: No difficulty with gait.  Neurological: Alert and oriented.    BMET    Component Value Date/Time   NA 138 06/19/2020 0444   NA 137 03/30/2020 1406   K 3.8 06/19/2020 0444   CL 109 06/19/2020 0444   CO2 23 06/19/2020 0444   GLUCOSE 125 (H) 06/19/2020 0444   BUN 14 06/19/2020 0444   BUN 12 03/30/2020 1406   CREATININE 1.08 (H) 06/19/2020 0444   CREATININE 1.11 (H) 12/11/2019 1142   CALCIUM 9.0 06/19/2020 0444   GFRNONAA 54 (L) 06/19/2020 0444   GFRNONAA 49 (L) 12/11/2019 1142   GFRAA 57 (L) 12/11/2019 1142    Lipid Panel     Component Value Date/Time   CHOL 208 (H) 12/11/2019 1142   CHOL 262 (H) 10/22/2014 1304   TRIG 169 (H) 02/29/2020 0956   HDL 39 (L) 12/11/2019 1142   HDL 43 10/22/2014 1304   CHOLHDL 5.3 (H) 12/11/2019 1142   LDLCALC 126 (H) 12/11/2019 1142    CBC    Component Value Date/Time   WBC 7.8 06/19/2020 0444   RBC 4.65 06/19/2020 0444   HGB 13.4 06/19/2020 0444   HGB 12.8 03/30/2020 1406   HCT 40.8 06/19/2020 0444   HCT 40.0 03/30/2020 1406   PLT 193 06/19/2020 0444   PLT 366 03/30/2020 1406   MCV 87.7 06/19/2020 0444   MCV 92 03/30/2020 1406   MCH 28.8 06/19/2020 0444   MCHC 32.8 06/19/2020 0444   RDW 13.4 06/19/2020 0444   RDW 12.2 03/30/2020 1406   LYMPHSABS 1.5 03/05/2020 0749   LYMPHSABS 3.7 (H) 10/22/2014 1304   MONOABS 1.8 (H)  03/05/2020 0749   EOSABS 0.1 03/05/2020 0749   EOSABS 0.2 10/22/2014 1304   BASOSABS 0.1 03/05/2020 0749   BASOSABS 0.1 10/22/2014 1304    Hgb A1C Lab Results  Component Value Date   HGBA1C 10.0 (H) 06/18/2020           Assessment & Plan:   Hospital follow-up for Abdominal Pain, Acute on Chronic Pancreatitis:  Hospital notes, labs and imaging reviewed. Medications reviewed, no changes Will obtain CBC with differential, c-Met, amylase, lipase, ESR and CRP per GI recommendation She will follow-up with GI as an outpatient  Return precautions discussed Webb Silversmith, NP This visit occurred during the SARS-CoV-2 public health emergency.  Safety protocols were in place, including screening questions prior to the visit, additional usage of staff PPE, and extensive cleaning of exam room while observing appropriate contact time as indicated for disinfecting solutions.

## 2020-07-15 LAB — COMPREHENSIVE METABOLIC PANEL
AG Ratio: 1.5 (calc) (ref 1.0–2.5)
ALT: 11 U/L (ref 6–29)
AST: 17 U/L (ref 10–35)
Albumin: 4.1 g/dL (ref 3.6–5.1)
Alkaline phosphatase (APISO): 58 U/L (ref 37–153)
BUN/Creatinine Ratio: 15 (calc) (ref 6–22)
BUN: 17 mg/dL (ref 7–25)
CO2: 26 mmol/L (ref 20–32)
Calcium: 10 mg/dL (ref 8.6–10.4)
Chloride: 106 mmol/L (ref 98–110)
Creat: 1.12 mg/dL — ABNORMAL HIGH (ref 0.60–1.00)
Globulin: 2.7 g/dL (calc) (ref 1.9–3.7)
Glucose, Bld: 110 mg/dL — ABNORMAL HIGH (ref 65–99)
Potassium: 3.9 mmol/L (ref 3.5–5.3)
Sodium: 141 mmol/L (ref 135–146)
Total Bilirubin: 0.4 mg/dL (ref 0.2–1.2)
Total Protein: 6.8 g/dL (ref 6.1–8.1)

## 2020-07-15 LAB — AMYLASE: Amylase: 96 U/L (ref 21–101)

## 2020-07-15 LAB — LIPASE: Lipase: 36 U/L (ref 7–60)

## 2020-07-15 LAB — CBC WITH DIFFERENTIAL/PLATELET
Absolute Monocytes: 516 cells/uL (ref 200–950)
Basophils Absolute: 92 cells/uL (ref 0–200)
Basophils Relative: 1.2 %
Eosinophils Absolute: 262 cells/uL (ref 15–500)
Eosinophils Relative: 3.4 %
HCT: 46 % — ABNORMAL HIGH (ref 35.0–45.0)
Hemoglobin: 14.7 g/dL (ref 11.7–15.5)
Lymphs Abs: 2564 cells/uL (ref 850–3900)
MCH: 28.7 pg (ref 27.0–33.0)
MCHC: 32 g/dL (ref 32.0–36.0)
MCV: 89.8 fL (ref 80.0–100.0)
MPV: 11.2 fL (ref 7.5–12.5)
Monocytes Relative: 6.7 %
Neutro Abs: 4266 cells/uL (ref 1500–7800)
Neutrophils Relative %: 55.4 %
Platelets: 218 10*3/uL (ref 140–400)
RBC: 5.12 10*6/uL — ABNORMAL HIGH (ref 3.80–5.10)
RDW: 13.4 % (ref 11.0–15.0)
Total Lymphocyte: 33.3 %
WBC: 7.7 10*3/uL (ref 3.8–10.8)

## 2020-07-15 LAB — SEDIMENTATION RATE: Sed Rate: 9 mm/h (ref 0–30)

## 2020-07-15 LAB — C-REACTIVE PROTEIN: CRP: 2.4 mg/L (ref <8.0)

## 2020-07-20 ENCOUNTER — Other Ambulatory Visit: Payer: Self-pay

## 2020-07-20 ENCOUNTER — Ambulatory Visit
Admission: RE | Admit: 2020-07-20 | Discharge: 2020-07-20 | Disposition: A | Discharge status: Home / Self Care | Payer: Medicare Other | Source: Ambulatory Visit | Attending: Gastroenterology | Admitting: Gastroenterology

## 2020-07-20 DIAGNOSIS — K8689 Other specified diseases of pancreas: Secondary | ICD-10-CM | POA: Diagnosis present

## 2020-07-20 DIAGNOSIS — K862 Cyst of pancreas: Secondary | ICD-10-CM | POA: Diagnosis present

## 2020-07-20 DIAGNOSIS — E7849 Other hyperlipidemia: Secondary | ICD-10-CM | POA: Diagnosis present

## 2020-07-20 MED ORDER — IOHEXOL 300 MG/ML  SOLN
75.0000 mL | Freq: Once | INTRAMUSCULAR | Status: AC | PRN
  Administered 2020-07-20: 75 mL via INTRAVENOUS

## 2020-07-22 ENCOUNTER — Encounter: Payer: Self-pay | Admitting: Gastroenterology

## 2020-07-22 ENCOUNTER — Ambulatory Visit (INDEPENDENT_AMBULATORY_CARE_PROVIDER_SITE_OTHER): Payer: Medicare Other | Admitting: Gastroenterology

## 2020-07-22 VITALS — BP 120/70 | HR 50 | Ht 66.0 in | Wt 128.0 lb

## 2020-07-22 DIAGNOSIS — K863 Pseudocyst of pancreas: Secondary | ICD-10-CM | POA: Diagnosis not present

## 2020-07-22 DIAGNOSIS — K862 Cyst of pancreas: Secondary | ICD-10-CM

## 2020-07-22 DIAGNOSIS — K8681 Exocrine pancreatic insufficiency: Secondary | ICD-10-CM | POA: Diagnosis not present

## 2020-07-22 DIAGNOSIS — K8591 Acute pancreatitis with uninfected necrosis, unspecified: Secondary | ICD-10-CM | POA: Diagnosis not present

## 2020-07-22 DIAGNOSIS — R935 Abnormal findings on diagnostic imaging of other abdominal regions, including retroperitoneum: Secondary | ICD-10-CM | POA: Diagnosis not present

## 2020-07-22 LAB — HM DIABETES EYE EXAM

## 2020-07-22 NOTE — Patient Instructions (Signed)
If you are age 75 or older, your body mass index should be between 23-30. Your Body mass index is 20.66 kg/m. If this is out of the aforementioned range listed, please consider follow up with your Primary Care Provider.  If you are age 37 or younger, your body mass index should be between 19-25. Your Body mass index is 20.66 kg/m. If this is out of the aformentioned range listed, please consider follow up with your Primary Care Provider.   __________________________________________________________  The [facility] providers would like to encourage you to use [facility] to communicate with providers for non-urgent requests or questions.  Due to long hold times on the telephone, sending your provider a message by [facility] may be a faster and more efficient way to get a response.  Please allow 48 business hours for a response.  Please remember that this is for non-urgent requests.   You have been scheduled for an endoscopy. Please follow written instructions given to you at your visit today. If you use inhalers (even only as needed), please bring them with you on the day of your procedure.  Thank you for choosing me and [facility].  Dr. Meridee Score

## 2020-07-23 ENCOUNTER — Encounter: Payer: Self-pay | Admitting: Gastroenterology

## 2020-07-23 ENCOUNTER — Encounter: Payer: Self-pay | Admitting: Internal Medicine

## 2020-07-23 NOTE — Progress Notes (Signed)
[facility] VISIT   Primary Care Provider Jearld Fenton, NP [place] [address] [phone]   Patient Profile: Holly Navarro is a 75 y.o. 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  The patient presents to the [facility] for an evaluation and management of problem(s) noted below:  Problem List 1. Acute necrotizing pancreatitis   2. Pseudocyst/walled off necrosis of pancreas   3. Abnormal CT of the pancreas   4. Exocrine pancreatic insufficiency     History of Present Illness Please see initial consultation note and prior progress notes for full details of HPI.  Interval History The patient returns for scheduled follow-up.  She ended up having some abdominal discomfort and went to [facility] imaging was performed and she was monitored and supported and discharged home.  She has done well since her discharge home and over the course the last 2 weeks has not had any pain or discomfort and has had increasing her weight.  She denies any fevers or chills.  She is eating what she wants but she does recall that she ate a pretty fatty meal before she had a significant mount of pain that occurred.  She continues to take Creon and notices her bowel movements are normal in quality.  She denies any blood in her stools.  She is hopeful that she will be able to go to the [place] this summer to see her family.  She is wondering if she ever needs to have this drained or not in regards to her pancreatic cyst/pancreatic walled off necrosis.  Blood test markers have returned showing no evidence of anemia or leukocytosis and normal inflammatory  markers.  She feels that her blood sugars are starting to stabilize and wonders if she will need to be on insulin for the rest of her life.  GI Review of Systems Positive as above Negative for dysphagia, odynophagia, change in bowel habits, hematochezia, melena   Review of Systems General: Denies fevers/chills/unintentional weight loss Cardiovascular: Denies chest pain/palpitations Pulmonary: Denies shortness of breath Gastroenterological: See HPI Genitourinary: Denies darkened urine Hematological: Denies easy bruising/bleeding Dermatological: Denies jaundice Psychological: Mood is stable   Medications Current Outpatient Medications  Medication Sig Dispense Refill   atenolol (TENORMIN) 50 MG tablet Take 1 tablet (50 mg total) by mouth daily. 90 tablet 1   blood glucose meter kit and supplies KIT Dispense based on patient and insurance preference. Use up to four times daily as directed. 1 each 0   co-enzyme Q-10 30 MG capsule Take 30 mg by mouth 3 (three) times daily.     ECHINACEA PO Take 900 mg by mouth 2 (two) times daily.     fluticasone (FLONASE) 50 MCG/ACT nasal spray Place 2 sprays into both nostrils daily. 15.8 mL 2   Insulin Pen Needle (NOVOFINE PEN NEEDLE) 32G X 6 MM MISC Use to inject insulin daily as directed. 90 each 1   lipase/protease/amylase (CREON) 36000 UNITS CPEP capsule Take 2 capsules (72,000 Units total) by mouth 3 (three) times daily with meals. Take 2 capsules (72,000 Units total) by mouth 3 (three) times daily with meals and 36000 with snack (Patient taking differently: Take 72,000 Units by mouth See admin instructions. Take 2 capsules (72,000 Units total) by mouth 3 (  three) times daily with meals and 36000 with snack) 270 capsule 1   lisinopril (ZESTRIL) 10 MG tablet Take 1 tablet (10 mg total) by mouth daily. 90 tablet 1   OVER THE COUNTER MEDICATION Take 1 capsule by mouth 2 (two) times daily. Freeburn Formula     pantoprazole (PROTONIX)  40 MG tablet Take 1 tablet (40 mg total) by mouth at bedtime. 90 tablet 1   Red Yeast Rice 600 MG CAPS Take 600 mg by mouth 2 (two) times daily.     simethicone (MYLICON) 80 MG chewable tablet Chew 80 mg by mouth in the morning, at noon, in the evening, and at bedtime.     TRESIBA FLEXTOUCH 100 UNIT/ML FlexTouch Pen Inject 7 Units into the skin daily. Adjust dose as advised. (Patient taking differently: Inject 8 Units into the skin daily. Adjust dose as advised.) 3 mL 2   No current facility-administered medications for this visit.    Allergies No Known Allergies  Histories Past Medical History:  Diagnosis Date   Anemia    Anxiety    Arthritis    Cardiac arrhythmia    Hyperlipidemia    Hypertension    Pancreatic pseudocyst    Seizure ([place])    Stomach ulcer    Past Surgical History:  Procedure Laterality Date   BIOPSY  05/20/2020   Procedure: BIOPSY;  Surgeon: Irving Copas., MD;  Location: [facility];  Service: Gastroenterology;;   ESOPHAGOGASTRODUODENOSCOPY N/A 05/20/2020   Procedure: ESOPHAGOGASTRODUODENOSCOPY (EGD);  Surgeon: Irving Copas., MD;  Location: [facility];  Service: Gastroenterology;  Laterality: N/A;   LAPAROSCOPIC OVARIAN CYSTECTOMY     TONSILLECTOMY     UPPER ESOPHAGEAL ENDOSCOPIC ULTRASOUND (EUS) N/A 05/20/2020   Procedure: UPPER ESOPHAGEAL ENDOSCOPIC ULTRASOUND (EUS)  ;  Surgeon: Irving Copas., MD;  Location: [facility];  Service: Gastroenterology;  Laterality: N/A;   Social History   Socioeconomic History   Marital status: Widowed    Spouse name: Not on file   Number of children: 4   Years of education: Not on file   Highest education level: Not on file  Occupational History   Occupation: retired  Tobacco Use   Smoking status: Every Day    Packs/day: 0.25    Types: Cigarettes    Last attempt to quit: 02/29/2020    Years since quitting: 0.4   Smokeless tobacco: Never  Vaping Use   Vaping Use: Never used  Substance  and Sexual Activity   Alcohol use: Not Currently    Alcohol/week: 0.0 standard drinks    Comment: occasionally    Drug use: No   Sexual activity: Not on file  Other Topics Concern   Not on file  Social History Narrative   Not on file   Social Determinants of Health   Financial Resource Strain: Not on file  Food Insecurity: Not on file  Transportation Needs: Not on file  Physical Activity: Not on file  Stress: Not on file  Social Connections: Not on file  Intimate Partner Violence: Not on file   Family History  Problem Relation Age of Onset   Heart disease Mother    Kidney disease Mother    Diabetes Father    Pancreatic cancer Brother        Agent Orange   Diabetes Paternal Uncle        x 2   Colon cancer Son 7   Esophageal cancer Neg Hx    Inflammatory bowel disease  Neg Hx    Liver disease Neg Hx    Stomach cancer Neg Hx    I have reviewed her medical, social, and family history in detail and updated the electronic medical record as necessary.    PHYSICAL EXAMINATION  BP 120/70   Pulse (!) 50   Ht 5' 6"  (1.676 m)   Wt 128 lb (58.1 kg)   BMI 20.66 kg/m  Wt Readings from Last 3 Encounters:  07/22/20 128 lb (58.1 kg)  07/14/20 128 lb 12.8 oz (58.4 kg)  06/18/20 134 lb (60.8 kg)  GEN: NAD, appears stated age, nontoxic PSYCH: Cooperative, without pressured speech EYE: Conjunctivae pink, sclerae anicteric ENT: Moist mucous membranes CV: Nontachycardic RESP: No audible wheezing GI: NABS, soft, nontender throughout, no rebound or guarding MSK/EXT: No lower extremity edema SKIN: No jaundice NEURO:  Alert & Oriented x 3, no focal deficits   REVIEW OF DATA  I reviewed the following data at the time of this encounter:  GI Procedures and Studies  We reviewed that EUS report  Laboratory Studies  Reviewed those in epic and care everywhere  Imaging Studies  July 2022 CT abdomen with contrast IMPRESSION: Resolution of acute peripancreatic inflammatory changes  since prior study. Diffuse pancreatic necrosis, with mild increase in size of large pseudocyst which replaces nearly the entire pancreas. Stable chronic splenic vein thrombosis. Stable 3.1 cm infrarenal abdominal aortic aneurysm. Recommend follow-up every 3 years  June 2022 CT abdomen pelvis with contrast IMPRESSION: 1. Interval decrease in size of the organized collection replacing the pancreas through the body and tail. There is new edema/inflammation tracking cranially up between the gastric fundus in the splenic hilum and then posteriorly around the medial aspect of the spleen and up towards the subdiaphragmatic space. This new edema/inflammation dissects posteriorly into the left anterior pararenal space immediately adjacent to the anterior left kidney. Imaging features suggest acute on chronic pancreatitis 2. Splenic vein patency cannot be confirmed. Portal vein and superior mesenteric vein are patent although there is marked mass-effect/attenuation on the portal splenic confluence. 3. 3.2 cm infrarenal abdominal aortic diameter. Recommend follow-up every 3 years. This recommendation follows ACR consensus guidelines: White Paper of the ACR Incidental Findings Committee II on Vascular Findings. J Am Coll Radiol 2013; 54:982-641. 4. Aortic Atherosclerosis (ICD10-I70.0).   ASSESSMENT  Ms. Mcfall is a 75 y.o. 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  The patient is seen today for evaluation and management of:  1. Acute necrotizing pancreatitis   2. Pseudocyst/walled off necrosis of pancreas   3. Abnormal CT of the pancreas   4. Exocrine pancreatic insufficiency    The patient  is hemodynamically and clinically stable.  Cyst remains relatively stable in size.  She SIBO done very well over the course the last 3 weeks since her discharge.  No significant issues.  There is a chance we may not need to do endoscopic therapy for this.  We may end up having to do endoscopic therapy if the patient has more symptoms.  All patient questions were answered to the best of my ability, and the patient agrees to the aforementioned plan of action with follow-up as indicated.     PLAN  Continue to monitor closely Low-fat diet moving forward Continue to try to do 2-3 protein  shakes daily in effort of trying to optimize calories and nutrition More frequent small meals Diabetes control as per primary care Continue pancreatic enzymes Tentative EUS with cyst gastrostomy in August/September based on patient returning from the [place] This Encounter  Procedures   Procedural/ Surgical Case Request: UPPER ESOPHAGEAL ENDOSCOPIC ULTRASOUND (EUS)   Ambulatory referral to Gastroenterology    New Prescriptions   No medications on file   Modified Medications   No medications on file    Planned Follow Up No follow-ups on file.   Total Time in Face-to-Face and in Coordination of Care for patient including independent/personal interpretation/review of prior testing, medical history, examination, medication adjustment, communicating results with the patient directly, and documentation with the EHR is 25 minutes.   Justice Britain, MD [facility] Office # [phone]

## 2020-07-24 ENCOUNTER — Ambulatory Visit: Payer: Medicare Other | Admitting: Gastroenterology

## 2020-08-04 ENCOUNTER — Ambulatory Visit: Payer: Medicare Other | Admitting: Gastroenterology

## 2020-09-04 ENCOUNTER — Encounter (HOSPITAL_COMMUNITY): Payer: Self-pay | Admitting: Gastroenterology

## 2020-09-08 ENCOUNTER — Other Ambulatory Visit: Payer: Self-pay

## 2020-09-11 ENCOUNTER — Telehealth: Payer: Self-pay | Admitting: Gastroenterology

## 2020-09-11 NOTE — Telephone Encounter (Signed)
I had an opportunity to speak with the patient today. She had alerted [place] that she was on her way back from her vacation with her family in the [place]. She is done exceedingly well while she has been away.  She had a slight decrease in her weight as a result of not being as stringent with her diet due to being on vacation but overall has felt well without significant early satiety, abdominal pain, nausea, vomiting. Based on how she is done, I do not think she needs to come in for EUS with cyst gastrostomy creation at this time. We will plan to see her back at the end of September or in the beginning of October.  She will undergo a CT abdomen a few days prior to the clinic visit. We will cancel her EUS for next week. I have already called the endoscopy unit and they are going to take her off the list for Monday.  Patty, Please call the patient next week and offer her a clinic visit at the end of September or in the first few weeks of October with a CT abdomen with IV contrast and oral contrast to be performed a few days prior.  Thanks. GM

## 2020-09-14 ENCOUNTER — Ambulatory Visit (HOSPITAL_COMMUNITY): Admission: RE | Admit: 2020-09-14 | Payer: Medicare Other | Source: Home / Self Care | Admitting: Gastroenterology

## 2020-09-14 ENCOUNTER — Other Ambulatory Visit: Payer: Self-pay

## 2020-09-14 DIAGNOSIS — K8591 Acute pancreatitis with uninfected necrosis, unspecified: Secondary | ICD-10-CM

## 2020-09-14 HISTORY — DX: Type 2 diabetes mellitus without complications: E11.9

## 2020-09-14 SURGERY — UPPER ESOPHAGEAL ENDOSCOPIC ULTRASOUND (EUS)
Anesthesia: Monitor Anesthesia Care

## 2020-09-14 NOTE — Telephone Encounter (Signed)
Left message on machine to call back  

## 2020-09-14 NOTE — Telephone Encounter (Signed)
The pt has been advised and scheduled for an appt on 10/12 at 310 pm.  CT scan order has been entered and sent to the schedulers.  The pt is aware to call if she has not heard from them in 1 week.

## 2020-09-28 ENCOUNTER — Other Ambulatory Visit: Payer: Self-pay | Admitting: Internal Medicine

## 2020-09-28 DIAGNOSIS — Z8719 Personal history of other diseases of the digestive system: Secondary | ICD-10-CM

## 2020-09-28 DIAGNOSIS — R739 Hyperglycemia, unspecified: Secondary | ICD-10-CM

## 2020-09-28 MED ORDER — FLUTICASONE PROPIONATE 50 MCG/ACT NA SUSP: 2.0000 | Freq: Every day | NASAL | 2 refills | Status: DC

## 2020-09-28 MED ORDER — NOVOFINE PEN NEEDLE 32G X 6 MM MISC: 1 refills | Status: AC

## 2020-09-28 NOTE — Telephone Encounter (Signed)
Medication Refill - Medication: fluticasone (FLONASE) 50 MCG/ACT nasal spray   Insulin Pen Needle (NOVOFINE PEN NEEDLE) 32G X 6 MM MISC Has the patient contacted their pharmacy? Yes.   (Agent: If no, request that the patient contact the pharmacy for the refill.) (Agent: If yes, when and what did the pharmacy advise?)no refills / call pcp  Preferred Pharmacy (with phone number or street name): [facility] [address] ([address]), [place] - [address]  [address]) [place] [address]  Phone:  [phone]  Fax:  [phone]  Has the patient been seen for an appointment in the last year OR does the patient have an upcoming appointment? Yes.   Last OV was 7.12.22 and CPE is scheduled for 12.9.22  Agent: Please be advised that RX refills may take up to 3 business days. We ask that you follow-up with your pharmacy.

## 2020-10-06 ENCOUNTER — Encounter: Payer: Self-pay | Admitting: Gastroenterology

## 2020-10-07 ENCOUNTER — Ambulatory Visit
Admission: RE | Admit: 2020-10-07 | Discharge: 2020-10-07 | Disposition: A | Discharge status: Home / Self Care | Payer: Medicare Other | Source: Ambulatory Visit | Attending: Gastroenterology | Admitting: Gastroenterology

## 2020-10-07 ENCOUNTER — Other Ambulatory Visit: Payer: Self-pay

## 2020-10-07 DIAGNOSIS — K8591 Acute pancreatitis with uninfected necrosis, unspecified: Secondary | ICD-10-CM | POA: Diagnosis present

## 2020-10-07 LAB — POCT I-STAT CREATININE: Creatinine, Ser: 1.3 mg/dL — ABNORMAL HIGH (ref 0.44–1.00)

## 2020-10-07 MED ORDER — IOHEXOL 350 MG/ML SOLN
80.0000 mL | Freq: Once | INTRAVENOUS | Status: AC | PRN
  Administered 2020-10-07: 80 mL via INTRAVENOUS

## 2020-10-14 ENCOUNTER — Ambulatory Visit (INDEPENDENT_AMBULATORY_CARE_PROVIDER_SITE_OTHER): Payer: Medicare Other | Admitting: Gastroenterology

## 2020-10-14 ENCOUNTER — Encounter: Payer: Self-pay | Admitting: Gastroenterology

## 2020-10-14 VITALS — BP 154/84 | HR 60 | Ht 66.5 in | Wt 128.4 lb

## 2020-10-14 DIAGNOSIS — R935 Abnormal findings on diagnostic imaging of other abdominal regions, including retroperitoneum: Secondary | ICD-10-CM

## 2020-10-14 DIAGNOSIS — K863 Pseudocyst of pancreas: Secondary | ICD-10-CM | POA: Diagnosis not present

## 2020-10-14 DIAGNOSIS — K862 Cyst of pancreas: Secondary | ICD-10-CM

## 2020-10-14 DIAGNOSIS — K859 Acute pancreatitis without necrosis or infection, unspecified: Secondary | ICD-10-CM

## 2020-10-14 DIAGNOSIS — Z8719 Personal history of other diseases of the digestive system: Secondary | ICD-10-CM | POA: Diagnosis not present

## 2020-10-14 DIAGNOSIS — K8689 Other specified diseases of pancreas: Secondary | ICD-10-CM

## 2020-10-14 DIAGNOSIS — F17200 Nicotine dependence, unspecified, uncomplicated: Secondary | ICD-10-CM

## 2020-10-14 DIAGNOSIS — R932 Abnormal findings on diagnostic imaging of liver and biliary tract: Secondary | ICD-10-CM

## 2020-10-14 NOTE — Patient Instructions (Signed)
You will be contacted by [facility] Scheduling in the next 2 days to arrange a CT scan abdomen w/ contrast at [facility]. The number on your caller ID will be [phone], please answer when they call.  If you have not heard from them in 2 days please call [phone] to schedule.    If you are age 75 or older, your body mass index should be between 23-30. Your Body mass index is 20.41 kg/m. If this is out of the aforementioned range listed, please consider follow up with your Primary Care Provider.  If you are age 52 or younger, your body mass index should be between 19-25. Your Body mass index is 20.41 kg/m. If this is out of the aformentioned range listed, please consider follow up with your Primary Care Provider.   __________________________________________________________  The [facility] providers would like to encourage you to use [facility] to communicate with providers for non-urgent requests or questions.  Due to long hold times on the telephone, sending your provider a message by [facility] may be a faster and more efficient way to get a response.  Please allow 48 business hours for a response.  Please remember that this is for non-urgent requests.   Thank you for choosing me and [facility].  Dr. Meridee Score

## 2020-10-15 ENCOUNTER — Encounter: Payer: Self-pay | Admitting: Gastroenterology

## 2020-10-15 DIAGNOSIS — R932 Abnormal findings on diagnostic imaging of liver and biliary tract: Secondary | ICD-10-CM | POA: Insufficient documentation

## 2020-10-15 DIAGNOSIS — K8689 Other specified diseases of pancreas: Secondary | ICD-10-CM | POA: Insufficient documentation

## 2020-10-15 DIAGNOSIS — F17200 Nicotine dependence, unspecified, uncomplicated: Secondary | ICD-10-CM | POA: Insufficient documentation

## 2020-10-15 DIAGNOSIS — K862 Cyst of pancreas: Secondary | ICD-10-CM | POA: Insufficient documentation

## 2020-10-15 NOTE — Progress Notes (Signed)
[facility] VISIT   Primary Care Provider Jearld Fenton, NP [place] [address] [phone]   Patient Profile: Holly Navarro is a 75 y.o. 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  The patient presents to the [facility] for an evaluation and management of problem(s) noted below:  Problem List 1. History of idiopathic pancreatitis   2. Pseudocyst/walled off necrosis of pancreas   3. Sterile pancreatic necrosis   4. Abnormal CT of the pancreas   5. Cyst of pancreas   6. Abnormal CT of liver   7. Tobacco use disorder     History of Present Illness Please see prior notes for full details of HPI.  Interval History The patient returns for scheduled follow-up [place] she was able to go to the [place] for vacation summer and see her family.  She has done extremely well.  She had been scheduled for potential EUS cyst gastrostomy button I called and spoke with her and since she was doing well we decided to hold off on that.  She has repeated her cross-sectional imaging results as below.  Her weight is starting to increase.  She is not having episodes of abdominal pain.  She is not having any early satiety or nausea or vomiting.  Her bowel movements are semiformed to formed and she remains on Creon.  The patient denies any fevers or chills.  Her blood sugars seem to be improving and stabilizing somewhat and she is excepted that she likely will need to be on insulin for the rest of her life.  She wonders if we need to drain the cyst on because she wants to be as healthy as possible.  She wonders about the etiology of her  underlying pancreatitis in the first place.  GI Review of Systems Positive as above Negative for odynophagia, dysphagia, abdominal pain, bloating, alteration bowel habits, melena, hematochezia   Review of Systems General: Denies fevers/chills/unintentional weight loss Cardiovascular: Denies chest pain/palpitations Pulmonary: Denies shortness of breath Gastroenterological: See HPI Genitourinary: Denies darkened urine Hematological: Denies easy bruising/bleeding Dermatological: Denies jaundice Psychological: Mood is stable   Medications Current Outpatient Medications  Medication Sig Dispense Refill   atenolol (TENORMIN) 50 MG tablet Take 1 tablet (50 mg total) by mouth daily. 90 tablet 1   blood glucose meter kit and supplies KIT Dispense based on patient and insurance preference. Use up to four times daily as directed. 1 each 0   Coenzyme Q10 (COQ10) 100 MG CAPS Take 100 mg by mouth in the morning.     ECHINACEA PO Take 900 mg by mouth in the morning.     fluticasone (FLONASE) 50 MCG/ACT nasal spray Place 2 sprays into both nostrils daily. 15.8 mL 2   Insulin Pen Needle (NOVOFINE PEN NEEDLE) 32G X 6 MM MISC Use to inject insulin daily as directed. 90 each 1   lipase/protease/amylase (CREON) 36000 UNITS CPEP capsule Take 2 capsules (72,000 Units total) by mouth 3 (three) times daily with meals. Take 2 capsules (72,000 Units total) by mouth 3 (three) times daily with meals and 36000 with snack (Patient taking differently: Take 36,000-72,000 Units by mouth See admin instructions. Take 2 capsules (72,000 Units total) by mouth 3 (three) times daily with each meal & take  1 capsule (36,000 units) with a snack) 270 capsule 1   lisinopril (ZESTRIL) 10 MG tablet Take 1 tablet (10 mg total) by mouth daily. 90 tablet 1   OVER THE COUNTER MEDICATION Take 1 capsule by mouth with breakfast, with lunch, and with evening meal. VISICLEAR - [facility] Formula     pantoprazole (PROTONIX) 40 MG  tablet Take 1 tablet (40 mg total) by mouth at bedtime. 90 tablet 1   Red Yeast Rice 600 MG CAPS Take 600 mg by mouth 2 (two) times daily.     simethicone (MYLICON) 893 MG chewable tablet Chew 125 mg by mouth in the morning, at noon, in the evening, and at bedtime.     TRESIBA FLEXTOUCH 100 UNIT/ML FlexTouch Pen Inject 7 Units into the skin daily. Adjust dose as advised. (Patient taking differently: Inject 9 Units into the skin daily. Adjust dose as advised.) 3 mL 2   No current facility-administered medications for this visit.    Allergies Allergies  Allergen Reactions   Fentanyl Other (See Comments)    Patient is unsure of reaction type    Morphine And Related Other (See Comments)    Patient is unsure of reaction type      Histories Past Medical History:  Diagnosis Date   Anemia    Anxiety    Arthritis    Cardiac arrhythmia    Diabetes mellitus without complication ([place])    Hyperlipidemia    Hypertension    Pancreatic pseudocyst    Seizure ([place])    Stomach ulcer    Past Surgical History:  Procedure Laterality Date   BIOPSY  05/20/2020   Procedure: BIOPSY;  Surgeon: Irving Copas., MD;  Location: [facility];  Service: Gastroenterology;;   ESOPHAGOGASTRODUODENOSCOPY N/A 05/20/2020   Procedure: ESOPHAGOGASTRODUODENOSCOPY (EGD);  Surgeon: Irving Copas., MD;  Location: [facility];  Service: Gastroenterology;  Laterality: N/A;   LAPAROSCOPIC OVARIAN CYSTECTOMY     TONSILLECTOMY     UPPER ESOPHAGEAL ENDOSCOPIC ULTRASOUND (EUS) N/A 05/20/2020   Procedure: UPPER ESOPHAGEAL ENDOSCOPIC ULTRASOUND (EUS)  ;  Surgeon: Irving Copas., MD;  Location: [facility];  Service: Gastroenterology;  Laterality: N/A;   Social History   Socioeconomic History   Marital status: Widowed    Spouse name: Not on file   Number of children: 4   Years of education: Not on file   Highest education level: Not on file  Occupational History   Occupation: retired  Tobacco  Use   Smoking status: Every Day    Packs/day: 0.25    Types: Cigarettes   Smokeless tobacco: Never  Vaping Use   Vaping Use: Never used  Substance and Sexual Activity   Alcohol use: Not Currently    Alcohol/week: 0.0 standard drinks    Comment: occasionally    Drug use: No   Sexual activity: Not on file  Other Topics Concern   Not on file  Social History Narrative   Not on file   Social Determinants of Health   Financial Resource Strain: Not on file  Food Insecurity: Not on file  Transportation Needs: Not on file  Physical Activity: Not on file  Stress: Not on file  Social Connections: Not on file  Intimate Partner Violence: Not on file   Family History  Problem Relation Age of Onset   Heart disease Mother    Kidney disease Mother    Diabetes Father    Pancreatic cancer Brother        Agent  [place]   Diabetes Paternal Uncle        x 2   Colon cancer Son 89   Esophageal cancer Neg Hx    Inflammatory bowel disease Neg Hx    Liver disease Neg Hx    Stomach cancer Neg Hx    I have reviewed her medical, social, and family history in detail and updated the electronic medical record as necessary.    PHYSICAL EXAMINATION  BP (!) 154/84   Pulse 60   Ht 5' 6.5" (1.689 m)   Wt 128 lb 6 oz (58.2 kg)   SpO2 98%   BMI 20.41 kg/m  Wt Readings from Last 3 Encounters:  10/14/20 128 lb 6 oz (58.2 kg)  07/22/20 128 lb (58.1 kg)  07/14/20 128 lb 12.8 oz (58.4 kg)  GEN: NAD, appears stated age, nontoxic PSYCH: Cooperative, without pressured speech EYE: Conjunctivae pink, sclerae anicteric ENT: Moist mucous membranes CV: Nontachycardic RESP: No audible wheezing GI: NABS, soft, nontender throughout, no rebound or guarding MSK/EXT: No lower extremity edema SKIN: No jaundice NEURO:  Alert & Oriented x 3, no focal deficits   REVIEW OF DATA  I reviewed the following data at the time of this encounter:  GI Procedures and Studies  Previously reviewed  Laboratory Studies   Reviewed those in epic and care everywhere  Imaging Studies  October 2022 CT abdomen with contrast IMPRESSION: 1. Slight enlargement of a complex pancreatic pseudocyst, with minimal inflammatory changes along the tail the pancreas. Associated chronic splenic vein thrombosis with collateral venous flow. 2. 3.1 cm infrarenal abdominal aortic aneurysm. Recommend follow-up every 3 years. Reference: J Am Coll Radiol 3559;74:163-845. 3. Cirrhosis. 4.  Aortic atherosclerosis (ICD10-I70.0).   ASSESSMENT  Ms. Sanjurjo is a 75 y.o. 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  The patient is seen today for evaluation and management of:  1. History of idiopathic pancreatitis   2. Pseudocyst/walled off necrosis of pancreas   3. Sterile pancreatic necrosis   4. Abnormal CT of the pancreas   5. Cyst of pancreas   6. Abnormal CT of liver   7. Tobacco use disorder    Overall, the patient is clinically and hemodynamically stable.  The patient has what appears to have a chronic splenic vein thrombosis with associated collateralization over the course of the last few months.  There is no overt splenomegaly and no overt gastric varices that develop.  We have held off on anticoagulation due to the chronic nature of this.  Although they described the size of this is being slightly larger she remains asymptomatic.  It is hard to make the patient better when they are already asymptomatic.  Certainly the risk of progressiveness for infection has decreased substantially but mass-effect and if the cyst continues to enlarge and suggest the potential of a pancreatic duct disruption that could require EUS and  ERCP.  Time will tell.  We are going to plan to repeat imaging in 2 months as long as she is doing well with the potential of EUS's gastrostomy if the cyst continues to increase in size or if there are any other significant changes.  The etiology of her pancreatitis remains idiopathic and although the gallbladder was theorized to potentially be a source for her we will need to  consider evaluation for micro choledocholithiasis/cholelithiasis in the future if she ever has another episode of pancreatitis that would be ideal although with the setting of her having had necrotizing pancreatitis she will be at risk of chronic pancreatitis.  She will continue Creon supplementation for now.  All patient questions were answered to the best of my ability, and the patient agrees to the aforementioned plan of action with follow-up as indicated.     PLAN  Continue Creon 72,000 units with each meal and 36,000 units with each snack Continue low-fat diet moving forward Continue to drink between 2 and 3 protein shakes per day Continue more frequent smaller meals during the day Repeat cross-sectional imaging in 2 months with follow-up in clinic Try to stop all tobacco use Holding on EUS cyst gastrostomy for now since she is asymptomatic Held anticoagulation due to chronic nature of splenic vein thrombosis but can reconsider in the future Consideration of colonoscopy at some point in the next year for colon cancer screening   Orders Placed This Encounter  Procedures   CT Abdomen Pelvis W Contrast    New Prescriptions   No medications on file   Modified Medications   No medications on file    Planned Follow Up No follow-ups on file.   Total Time in Face-to-Face and in Coordination of Care for patient including independent/personal interpretation/review of prior testing, medical history, examination, medication adjustment, communicating results with the patient directly, and documentation with the EHR is 25  minutes.   Justice Britain, MD [facility] Office # [phone]

## 2020-11-30 ENCOUNTER — Other Ambulatory Visit: Payer: Self-pay | Admitting: Gastroenterology

## 2020-11-30 ENCOUNTER — Encounter: Payer: Self-pay | Admitting: Gastroenterology

## 2020-11-30 DIAGNOSIS — K8689 Other specified diseases of pancreas: Secondary | ICD-10-CM

## 2020-11-30 DIAGNOSIS — R1084 Generalized abdominal pain: Secondary | ICD-10-CM

## 2020-11-30 NOTE — Telephone Encounter (Signed)
With patient's increased abdominal discomfort we will try to get her CT scan completed sooner. Patty, please move forward with trying to reschedule her CT scan next available for her convenience so that I can review and see if she may end up needing an EUS's gastrostomy. Thanks. GM

## 2020-11-30 NOTE — Telephone Encounter (Signed)
Amy I am having to try and get this pt CT scan moved to a sooner date than 12/6 at [facility]. I called and tried to reschedule her to [facility] on this Thursday and the scheduler is telling me that the original CT should never have been scheduled and its not authorized.  She would not put the 12/6 appt back in or set up the one for this Thursday.  She says medicare will not cover the test.. Can you help me with this?

## 2020-12-01 NOTE — Telephone Encounter (Signed)
I don't know what they are talking about.  Medicare does not require precerts for anything we do.  The only thing in my que this morning was that I needed to change the dept of where it will be done.

## 2020-12-03 ENCOUNTER — Ambulatory Visit (HOSPITAL_COMMUNITY)
Admission: RE | Admit: 2020-12-03 | Discharge: 2020-12-03 | Disposition: A | Discharge status: Home / Self Care | Payer: Medicare Other | Source: Ambulatory Visit | Attending: Gastroenterology | Admitting: Gastroenterology

## 2020-12-03 ENCOUNTER — Other Ambulatory Visit: Payer: Self-pay | Admitting: Gastroenterology

## 2020-12-03 ENCOUNTER — Encounter: Payer: Self-pay | Admitting: Gastroenterology

## 2020-12-03 DIAGNOSIS — R1084 Generalized abdominal pain: Secondary | ICD-10-CM | POA: Diagnosis present

## 2020-12-03 DIAGNOSIS — K8689 Other specified diseases of pancreas: Secondary | ICD-10-CM | POA: Diagnosis present

## 2020-12-03 LAB — POCT I-STAT CREATININE: Creatinine, Ser: 1.8 mg/dL — ABNORMAL HIGH (ref 0.44–1.00)

## 2020-12-08 ENCOUNTER — Ambulatory Visit: Payer: Medicare Other

## 2020-12-11 ENCOUNTER — Other Ambulatory Visit: Payer: Self-pay

## 2020-12-11 ENCOUNTER — Ambulatory Visit (INDEPENDENT_AMBULATORY_CARE_PROVIDER_SITE_OTHER): Payer: Medicare Other | Admitting: Internal Medicine

## 2020-12-11 ENCOUNTER — Ambulatory Visit: Payer: Medicare Other | Admitting: Gastroenterology

## 2020-12-11 ENCOUNTER — Encounter: Payer: Self-pay | Admitting: Internal Medicine

## 2020-12-11 ENCOUNTER — Other Ambulatory Visit: Payer: Self-pay | Admitting: Internal Medicine

## 2020-12-11 VITALS — BP 135/60 | HR 63 | Temp 97.5°F | Resp 17 | Ht 66.5 in | Wt 127.6 lb

## 2020-12-11 DIAGNOSIS — Z794 Long term (current) use of insulin: Secondary | ICD-10-CM

## 2020-12-11 DIAGNOSIS — E119 Type 2 diabetes mellitus without complications: Secondary | ICD-10-CM

## 2020-12-11 DIAGNOSIS — Z0000 Encounter for general adult medical examination without abnormal findings: Secondary | ICD-10-CM | POA: Diagnosis not present

## 2020-12-11 DIAGNOSIS — Z23 Encounter for immunization: Secondary | ICD-10-CM

## 2020-12-11 DIAGNOSIS — I10 Essential (primary) hypertension: Secondary | ICD-10-CM

## 2020-12-11 DIAGNOSIS — K219 Gastro-esophageal reflux disease without esophagitis: Secondary | ICD-10-CM

## 2020-12-11 NOTE — Patient Instructions (Signed)
Health Maintenance for Postmenopausal Women ?Menopause is a normal process in which your ability to get pregnant comes to an end. This process happens slowly over many months or years, usually between the ages of 48 and 55. Menopause is complete when you have missed your menstrual period for 12 months. ?It is important to talk with your health care provider about some of the most common conditions that affect women after menopause (postmenopausal women). These include heart disease, cancer, and bone loss (osteoporosis). Adopting a healthy lifestyle and getting preventive care can help to promote your health and wellness. The actions you take can also lower your chances of developing some of these common conditions. ?What are the signs and symptoms of menopause? ?During menopause, you may have the following symptoms: ?Hot flashes. These can be moderate or severe. ?Night sweats. ?Decrease in sex drive. ?Mood swings. ?Headaches. ?Tiredness (fatigue). ?Irritability. ?Memory problems. ?Problems falling asleep or staying asleep. ?Talk with your health care provider about treatment options for your symptoms. ?Do I need hormone replacement therapy? ?Hormone replacement therapy is effective in treating symptoms that are caused by menopause, such as hot flashes and night sweats. ?Hormone replacement carries certain risks, especially as you become older. If you are thinking about using estrogen or estrogen with progestin, discuss the benefits and risks with your health care provider. ?How can I reduce my risk for heart disease and stroke? ?The risk of heart disease, heart attack, and stroke increases as you age. One of the causes may be a change in the body's hormones during menopause. This can affect how your body uses dietary fats, triglycerides, and cholesterol. Heart attack and stroke are medical emergencies. There are many things that you can do to help prevent heart disease and stroke. ?Watch your blood pressure ?High  blood pressure causes heart disease and increases the risk of stroke. This is more likely to develop in people who have high blood pressure readings or are overweight. ?Have your blood pressure checked: ?Every 3-5 years if you are 18-39 years of age. ?Every year if you are 40 years old or older. ?Eat a healthy diet ? ?Eat a diet that includes plenty of vegetables, fruits, low-fat dairy products, and lean protein. ?Do not eat a lot of foods that are high in solid fats, added sugars, or sodium. ?Get regular exercise ?Get regular exercise. This is one of the most important things you can do for your health. Most adults should: ?Try to exercise for at least 150 minutes each week. The exercise should increase your heart rate and make you sweat (moderate-intensity exercise). ?Try to do strengthening exercises at least twice each week. Do these in addition to the moderate-intensity exercise. ?Spend less time sitting. Even light physical activity can be beneficial. ?Other tips ?Work with your health care provider to achieve or maintain a healthy weight. ?Do not use any products that contain nicotine or tobacco. These products include cigarettes, chewing tobacco, and vaping devices, such as e-cigarettes. If you need help quitting, ask your health care provider. ?Know your numbers. Ask your health care provider to check your cholesterol and your blood sugar (glucose). Continue to have your blood tested as directed by your health care provider. ?Do I need screening for cancer? ?Depending on your health history and family history, you may need to have cancer screenings at different stages of your life. This may include screening for: ?Breast cancer. ?Cervical cancer. ?Lung cancer. ?Colorectal cancer. ?What is my risk for osteoporosis? ?After menopause, you may be   at increased risk for osteoporosis. Osteoporosis is a condition in which bone destruction happens more quickly than new bone creation. To help prevent osteoporosis or  the bone fractures that can happen because of osteoporosis, you may take the following actions: ?If you are 19-50 years old, get at least 1,000 mg of calcium and at least 600 international units (IU) of vitamin D per day. ?If you are older than age 50 but younger than age 70, get at least 1,200 mg of calcium and at least 600 international units (IU) of vitamin D per day. ?If you are older than age 70, get at least 1,200 mg of calcium and at least 800 international units (IU) of vitamin D per day. ?Smoking and drinking excessive alcohol increase the risk of osteoporosis. Eat foods that are rich in calcium and vitamin D, and do weight-bearing exercises several times each week as directed by your health care provider. ?How does menopause affect my mental health? ?Depression may occur at any age, but it is more common as you become older. Common symptoms of depression include: ?Feeling depressed. ?Changes in sleep patterns. ?Changes in appetite or eating patterns. ?Feeling an overall lack of motivation or enjoyment of activities that you previously enjoyed. ?Frequent crying spells. ?Talk with your health care provider if you think that you are experiencing any of these symptoms. ?General instructions ?See your health care provider for regular wellness exams and vaccines. This may include: ?Scheduling regular health, dental, and eye exams. ?Getting and maintaining your vaccines. These include: ?Influenza vaccine. Get this vaccine each year before the flu season begins. ?Pneumonia vaccine. ?Shingles vaccine. ?Tetanus, diphtheria, and pertussis (Tdap) booster vaccine. ?Your health care provider may also recommend other immunizations. ?Tell your health care provider if you have ever been abused or do not feel safe at home. ?Summary ?Menopause is a normal process in which your ability to get pregnant comes to an end. ?This condition causes hot flashes, night sweats, decreased interest in sex, mood swings, headaches, or lack  of sleep. ?Treatment for this condition may include hormone replacement therapy. ?Take actions to keep yourself healthy, including exercising regularly, eating a healthy diet, watching your weight, and checking your blood pressure and blood sugar levels. ?Get screened for cancer and depression. Make sure that you are up to date with all your vaccines. ?This information is not intended to replace advice given to you by your health care provider. Make sure you discuss any questions you have with your health care provider. ?Document Revised: 05/11/2020 Document Reviewed: 05/11/2020 ?Elsevier Patient Education ? 2022 [employer] ? ?

## 2020-12-11 NOTE — Progress Notes (Signed)
HPI:  Patient presents to the clinic today for her subsequent annual Medicare Wellness Exam.  Past Medical History:  Diagnosis Date   Anemia    Anxiety    Arthritis    Cardiac arrhythmia    Diabetes mellitus without complication (HCC)    Hyperlipidemia    Hypertension    Pancreatic pseudocyst    Seizure ([place])    Stomach ulcer     Current Outpatient Medications  Medication Sig Dispense Refill   atenolol (TENORMIN) 50 MG tablet Take 1 tablet by mouth once daily 90 tablet 1   blood glucose meter kit and supplies KIT Dispense based on patient and insurance preference. Use up to four times daily as directed. 1 each 0   Coenzyme Q10 (COQ10) 100 MG CAPS Take 100 mg by mouth in the morning.     ECHINACEA PO Take 900 mg by mouth in the morning.     fluticasone (FLONASE) 50 MCG/ACT nasal spray Place 2 sprays into both nostrils daily. 15.8 mL 2   Insulin Pen Needle (NOVOFINE PEN NEEDLE) 32G X 6 MM MISC Use to inject insulin daily as directed. 90 each 1   lipase/protease/amylase (CREON) 36000 UNITS CPEP capsule Take 2 capsules (72,000 Units total) by mouth 3 (three) times daily with meals. Take 2 capsules (72,000 Units total) by mouth 3 (three) times daily with meals and 36000 with snack (Patient taking differently: Take 36,000-72,000 Units by mouth See admin instructions. Take 2 capsules (72,000 Units total) by mouth 3 (three) times daily with each meal & take 1 capsule (36,000 units) with a snack) 270 capsule 1   lisinopril (ZESTRIL) 10 MG tablet Take 1 tablet (10 mg total) by mouth daily. 90 tablet 1   OVER THE COUNTER MEDICATION Take 1 capsule by mouth with breakfast, with lunch, and with evening meal. VISICLEAR - [facility] Formula     pantoprazole (PROTONIX) 40 MG tablet TAKE 1 TABLET BY MOUTH AT BEDTIME 90 tablet 1   Red Yeast Rice 600 MG CAPS Take 600 mg by mouth 2 (two) times daily.     simethicone (MYLICON) 235 MG chewable tablet Chew 125 mg by mouth in the morning, at noon, in  the evening, and at bedtime.     TRESIBA FLEXTOUCH 100 UNIT/ML FlexTouch Pen Inject 7 Units into the skin daily. Adjust dose as advised. (Patient taking differently: Inject 9 Units into the skin daily. Adjust dose as advised.) 3 mL 2   No current facility-administered medications for this visit.    Allergies  Allergen Reactions   Fentanyl Other (See Comments)    Patient is unsure of reaction type    Morphine And Related Other (See Comments)    Patient is unsure of reaction type      Family History  Problem Relation Age of Onset   Heart disease Mother    Kidney disease Mother    Diabetes Father    Pancreatic cancer Brother        Agent Orange   Diabetes Paternal Uncle        x 2   Colon cancer Son 46   Esophageal cancer Neg Hx    Inflammatory bowel disease Neg Hx    Liver disease Neg Hx    Stomach cancer Neg Hx     Social History   Socioeconomic History   Marital status: Widowed    Spouse name: Not on file   Number of children: 4   Years of education: Not on file  Highest education level: Not on file  Occupational History   Occupation: retired  Tobacco Use   Smoking status: Every Day    Packs/day: 0.25    Types: Cigarettes   Smokeless tobacco: Never  Vaping Use   Vaping Use: Never used  Substance and Sexual Activity   Alcohol use: Not Currently    Alcohol/week: 0.0 standard drinks    Comment: occasionally    Drug use: No   Sexual activity: Not on file  Other Topics Concern   Not on file  Social History Narrative   Not on file   Social Determinants of Health   Financial Resource Strain: Not on file  Food Insecurity: Not on file  Transportation Needs: Not on file  Physical Activity: Not on file  Stress: Not on file  Social Connections: Not on file  Intimate Partner Violence: Not on file    Hospitiliaztions: 06/2020 Pancreatitis  Health Maintenance:    Flu: 11/2019  Tetanus: unsure  Pneumovax: never  Prevnar: never  Shingrix: never  Covid:  Pf[employer] x 4  Mammogram:  no longer screening  Pap Smear: no longer screening  Bone Density: never  Colon Screening: never  Eye Doctor: annually  Dental Exam: as needed   Providers:   PCP: Webb Silversmith, NP  Gastroenterologist: Dr. Rush Landmark     I have personally reviewed and have noted:  1. The patient's medical and social history 2. Their use of alcohol, tobacco or illicit drugs 3. Their current medications and supplements 4. The patient's functional ability including ADL's, fall risks, home safety risks and hearing or visual impairment. 5. Diet and physical activities 6. Evidence for depression or mood disorder  Subjective:   Review of Systems:   Constitutional: Denies fever, malaise, fatigue, headache or abrupt weight changes.  HEENT: Denies eye pain, eye redness, ear pain, ringing in the ears, wax buildup, runny nose, nasal congestion, bloody nose, or sore throat. Respiratory: Denies difficulty breathing, shortness of breath, cough or sputum production.   Cardiovascular: Denies chest pain, chest tightness, palpitations or swelling in the hands or feet.  Gastrointestinal: Denies abdominal pain, bloating, constipation, diarrhea or blood in the stool.  GU: Denies urgency, frequency, pain with urination, burning sensation, blood in urine, odor or discharge. Musculoskeletal: Pt reports joint pain. Denies decrease in range of motion, difficulty with gait, muscle pain or joint swelling.  Skin: Denies redness, rashes, lesions or ulcercations.  Neurological: Denies dizziness, difficulty with memory, difficulty with speech or problems with balance and coordination.  Psych: Pt has a history of anxiety. Denies depression, SI/HI.  No other specific complaints in a complete review of systems (except as listed in HPI above).  Objective:  PE:  BP 135/60 (BP Location: Left Arm, Patient Position: Sitting, Cuff Size: Normal)   Pulse 63   Temp (!) 97.5 F (36.4 C) (Temporal)   Resp 17    Ht 5' 6.5" (1.689 m)   Wt 127 lb 9.6 oz (57.9 kg)   SpO2 100%   BMI 20.29 kg/m   Wt Readings from Last 3 Encounters:  10/14/20 128 lb 6 oz (58.2 kg)  07/22/20 128 lb (58.1 kg)  07/14/20 128 lb 12.8 oz (58.4 kg)    General: Appears her stated age, well developed, well nourished in NAD. Skin: Warm, dry and intact.  HEENT: Head: normal shape and size; Eyes: sclera white and EOMs intact;  Neck: Neck supple, trachea midline. No masses, lumps or thyromegaly present.  Cardiovascular: Normal rate and rhythm. S1,S2 noted.  No murmur, rubs or gallops noted. No JVD or BLE edema. No carotid bruits noted. Pulmonary/Chest: Normal effort and positive vesicular breath sounds. No respiratory distress. No wheezes, rales or ronchi noted.  Abdomen: Soft and nontender. Normal bowel sounds. No distention or masses noted. Liver, spleen and kidneys non palpable. Musculoskeletal: Strength 5/5 BUE/BLE. No dificulty with gait. Neurological: Alert and oriented. Cranial nerves II-XII grossly intact. Coordination normal.  Psychiatric: Mood and affect normal. Behavior is normal. Judgment and thought content normal.     BMET    Component Value Date/Time   NA 141 07/14/2020 0930   NA 137 03/30/2020 1406   K 3.9 07/14/2020 0930   CL 106 07/14/2020 0930   CO2 26 07/14/2020 0930   GLUCOSE 110 (H) 07/14/2020 0930   BUN 17 07/14/2020 0930   BUN 12 03/30/2020 1406   CREATININE 1.80 (H) 12/03/2020 0925   CREATININE 1.12 (H) 07/14/2020 0930   CALCIUM 10.0 07/14/2020 0930   GFRNONAA 54 (L) 06/19/2020 0444   GFRNONAA 49 (L) 12/11/2019 1142   GFRAA 57 (L) 12/11/2019 1142    Lipid Panel     Component Value Date/Time   CHOL 208 (H) 12/11/2019 1142   CHOL 262 (H) 10/22/2014 1304   TRIG 169 (H) 02/29/2020 0956   HDL 39 (L) 12/11/2019 1142   HDL 43 10/22/2014 1304   CHOLHDL 5.3 (H) 12/11/2019 1142   LDLCALC 126 (H) 12/11/2019 1142    CBC    Component Value Date/Time   WBC 7.7 07/14/2020 0930   RBC 5.12  (H) 07/14/2020 0930   HGB 14.7 07/14/2020 0930   HGB 12.8 03/30/2020 1406   HCT 46.0 (H) 07/14/2020 0930   HCT 40.0 03/30/2020 1406   PLT 218 07/14/2020 0930   PLT 366 03/30/2020 1406   MCV 89.8 07/14/2020 0930   MCV 92 03/30/2020 1406   MCH 28.7 07/14/2020 0930   MCHC 32.0 07/14/2020 0930   RDW 13.4 07/14/2020 0930   RDW 12.2 03/30/2020 1406   LYMPHSABS 2,564 07/14/2020 0930   LYMPHSABS 3.7 (H) 10/22/2014 1304   MONOABS 1.8 (H) 03/05/2020 0749   EOSABS 262 07/14/2020 0930   EOSABS 0.2 10/22/2014 1304   BASOSABS 92 07/14/2020 0930   BASOSABS 0.1 10/22/2014 1304    Hgb A1C Lab Results  Component Value Date   HGBA1C 10.0 (H) 06/18/2020      Assessment and Plan:   Medicare Annual Wellness Visit:  Diet: She does eat meat. She consumes fruits and veggies. She tries to avoid fried foods. She drinks mostly water. Physical activity: Walking Depression/mood screen: Negative, PHQ 9 score of 0 Hearing: Intact to whispered voice Visual acuity: Grossly normal, performs annual eye exam  ADLs: Capable Fall risk: None Home safety: Good Cognitive evaluation: Intact to orientation, naming, recall and repetition EOL planning: Adv directives, full code/ I agree  Preventative Medicine: Flu shot today. She thinks she has had a tetanus in the last 10 years. She declines pneumovax, prevnar or shingrix. Covid vaccine UTD. She no longer wants to screen for cervical, breast, colon cancer or osteoporosis. Encouraged her to consume a balanced diet and exercise regimen. Advised her to see an eye doctor and dentist annually. Will check CBC, CMET, Lipid, A1C today. Due dates for screening exam given to patient as part of her AVS.   Next appointment: 3 months, follow up chronic conditions   Webb Silversmith, NP This visit occurred during the SARS-CoV-2 public health emergency.  Safety protocols were in place, including screening  questions prior to the visit, additional usage of staff PPE, and  extensive cleaning of exam room while observing appropriate contact time as indicated for disinfecting solutions.

## 2020-12-11 NOTE — Telephone Encounter (Signed)
   Notes to clinic:  Has appt today with NP Baity, please assess.   Requested Prescriptions  Pending Prescriptions Disp Refills   atenolol (TENORMIN) 50 MG tablet [Pharmacy Med Name: Atenolol 50 MG Oral Tablet] 90 tablet 0    Sig: Take 1 tablet by mouth once daily     Cardiovascular:  Beta Blockers Failed - 12/11/2020  2:28 AM      Failed - Last BP in normal range    BP Readings from Last 1 Encounters:  10/14/20 (!) 154/84          Passed - Last Heart Rate in normal range    Pulse Readings from Last 1 Encounters:  10/14/20 60          Passed - Valid encounter within last 6 months    Recent Outpatient Visits           5 months ago Acute on chronic pancreatitis [facility])   [facility] [place], Salvadore Oxford, NP   6 months ago Gastroesophageal reflux disease, unspecified whether esophagitis present   [facility] [place], Salvadore Oxford, NP   8 months ago Hyperglycemia   [facility] Smitty Cords, DO   8 months ago Acute necrotizing pancreatitis   [facility] [place], Netta Neat, DO   1 year ago Mixed dyslipidemia   [facility] [place], Jodelle Gross, FNP       Future Appointments             Today Sampson Si, Salvadore Oxford, NP [facility]             pantoprazole (PROTONIX) 40 MG tablet [Pharmacy Med Name: Pantoprazole Sodium 40 MG Oral Tablet Delayed Release] 90 tablet 0    Sig: TAKE 1 TABLET BY MOUTH AT BEDTIME     Gastroenterology: Proton Pump Inhibitors Passed - 12/11/2020  2:28 AM      Passed - Valid encounter within last 12 months    Recent Outpatient Visits           5 months ago Acute on chronic pancreatitis [facility])   [facility] [place], Salvadore Oxford, NP   6 months ago Gastroesophageal reflux disease, unspecified whether esophagitis present   [facility] [place], Salvadore Oxford, NP   8 months ago Hyperglycemia   [facility] Smitty Cords, DO   8 months ago Acute necrotizing pancreatitis   [facility] Smitty Cords, DO   1 year ago Mixed dyslipidemia   [facility], Jodelle Gross, FNP       Future Appointments             Today Sampson Si, Salvadore Oxford, NP [facility]

## 2020-12-12 LAB — COMPLETE METABOLIC PANEL WITH GFR
AG Ratio: 1.3 (calc) (ref 1.0–2.5)
ALT: 10 U/L (ref 6–29)
AST: 14 U/L (ref 10–35)
Albumin: 3.5 g/dL — ABNORMAL LOW (ref 3.6–5.1)
Alkaline phosphatase (APISO): 58 U/L (ref 37–153)
BUN/Creatinine Ratio: 10 (calc) (ref 6–22)
BUN: 13 mg/dL (ref 7–25)
CO2: 25 mmol/L (ref 20–32)
Calcium: 9.1 mg/dL (ref 8.6–10.4)
Chloride: 104 mmol/L (ref 98–110)
Creat: 1.24 mg/dL — ABNORMAL HIGH (ref 0.60–1.00)
Globulin: 2.8 g/dL (calc) (ref 1.9–3.7)
Glucose, Bld: 300 mg/dL — ABNORMAL HIGH (ref 65–139)
Potassium: 4.3 mmol/L (ref 3.5–5.3)
Sodium: 137 mmol/L (ref 135–146)
Total Bilirubin: 0.4 mg/dL (ref 0.2–1.2)
Total Protein: 6.3 g/dL (ref 6.1–8.1)
eGFR: 45 mL/min/{1.73_m2} — ABNORMAL LOW (ref >=60)

## 2020-12-12 LAB — CBC
HCT: 40.7 % (ref 35.0–45.0)
Hemoglobin: 13.5 g/dL (ref 11.7–15.5)
MCH: 29.9 pg (ref 27.0–33.0)
MCHC: 33.2 g/dL (ref 32.0–36.0)
MCV: 90.2 fL (ref 80.0–100.0)
MPV: 11.1 fL (ref 7.5–12.5)
Platelets: 213 10*3/uL (ref 140–400)
RBC: 4.51 10*6/uL (ref 3.80–5.10)
RDW: 12.9 % (ref 11.0–15.0)
WBC: 7.4 10*3/uL (ref 3.8–10.8)

## 2020-12-12 LAB — LIPID PANEL
Cholesterol: 200 mg/dL — ABNORMAL HIGH (ref <200)
HDL: 29 mg/dL — ABNORMAL LOW (ref >=50)
LDL Cholesterol (Calc): 132 mg/dL (calc) — ABNORMAL HIGH
Non-HDL Cholesterol (Calc): 171 mg/dL (calc) — ABNORMAL HIGH (ref <130)
Total CHOL/HDL Ratio: 6.9 (calc) — ABNORMAL HIGH (ref <5.0)
Triglycerides: 244 mg/dL — ABNORMAL HIGH (ref <150)

## 2020-12-12 LAB — HEMOGLOBIN A1C
Hgb A1c MFr Bld: 7.3 % of total Hgb — ABNORMAL HIGH (ref <5.7)
Mean Plasma Glucose: 163 mg/dL
eAG (mmol/L): 9 mmol/L

## 2020-12-30 ENCOUNTER — Encounter: Payer: Self-pay | Admitting: Gastroenterology

## 2020-12-30 ENCOUNTER — Ambulatory Visit (INDEPENDENT_AMBULATORY_CARE_PROVIDER_SITE_OTHER): Payer: Medicare Other | Admitting: Gastroenterology

## 2020-12-30 VITALS — BP 172/66 | HR 75 | Ht 66.0 in | Wt 127.0 lb

## 2020-12-30 DIAGNOSIS — K862 Cyst of pancreas: Secondary | ICD-10-CM | POA: Diagnosis not present

## 2020-12-30 DIAGNOSIS — K8689 Other specified diseases of pancreas: Secondary | ICD-10-CM

## 2020-12-30 DIAGNOSIS — K8501 Idiopathic acute pancreatitis with uninfected necrosis: Secondary | ICD-10-CM | POA: Diagnosis not present

## 2020-12-30 DIAGNOSIS — R109 Unspecified abdominal pain: Secondary | ICD-10-CM

## 2020-12-30 DIAGNOSIS — K8681 Exocrine pancreatic insufficiency: Secondary | ICD-10-CM

## 2020-12-30 DIAGNOSIS — Z800 Family history of malignant neoplasm of digestive organs: Secondary | ICD-10-CM

## 2020-12-30 NOTE — Progress Notes (Signed)
[facility] VISIT   Primary Care Provider Jearld Fenton, NP [place] [address] [phone]   Patient Profile: Holly Navarro is a 75 y.o. female with a pmh significant for hypertension, hyperlipidemia anxiety, arthritis peptic ulcer disease, seizures, AAA, insulin-dependent diabetes (secondary to severe pancreatitis), family history colon cancer (son), family history of pancreas cancer (brother), idiopathic pancreatitis in 8563 (complicated pancreatic pseudocyst with walled off necrosis, splenic vein thrombosis).  The patient presents to the [facility] for an evaluation and management of problem(s) noted below:  Problem List 1. Idiopathic acute pancreatitis with uninfected necrosis   2. Sterile pancreatic necrosis   3. Pancreatic cyst   4. Recurrent abdominal pain   5. Exocrine pancreatic insufficiency   6. Family history of colon cancer      History of Present Illness Please see prior notes for full details of HPI.  Interval History The patient returns for scheduled follow-up.  At the end of November she was experiencing significant abdominal discomforts.  Repeat cross-sectional imaging, performed without IV contrast due to slight creatinine elevation, showed a relatively stable pancreatic cyst/walled off necrosis.  She was scheduled for follow-up initially in mid December and subsequently then transition to the end of December.  Since does few days, the patient has cut out her protein shakes and is eating healthier.  She has not had any recurrence of her abdominal pain at this time.  Her weight has been staying relatively stable.  She will have very infrequent episodes of early satiety and abdominal pain but currently is doing well.  She continues on Creon.  She continues on insulin.  She continues to wonder if we will need to drain the cyst or just continue to monitor since she is doing well.  GI Review of Systems Positive  as above Negative for dysphagia, odynophagia, alteration of bowel habits, bloating, melena, hematochezia  Review of Systems General: Denies fevers/chills/unintentional weight loss Cardiovascular: Denies chest pain/palpitations Pulmonary: Denies shortness of breath Gastroenterological: See HPI Genitourinary: Denies darkened urine Hematological: Denies easy bruising/bleeding Dermatological: Denies jaundice Psychological: Mood is stable   Medications Current Outpatient Medications  Medication Sig Dispense Refill   atenolol (TENORMIN) 50 MG tablet Take 1 tablet by mouth once daily 90 tablet 1   blood glucose meter kit and supplies KIT Dispense based on patient and insurance preference. Use up to four times daily as directed. 1 each 0   cimetidine (TAGAMET) 200 MG tablet Take 200 mg by mouth 2 (two) times daily as needed.     Coenzyme Q10 (COQ10) 100 MG CAPS Take 100 mg by mouth in the morning.     ECHINACEA PO Take 900 mg by mouth in the morning.     fluticasone (FLONASE) 50 MCG/ACT nasal spray Place 2 sprays into both nostrils daily. (Patient taking differently: Place 2 sprays into both nostrils daily as needed.) 15.8 mL 2   Insulin Pen Needle (NOVOFINE PEN NEEDLE) 32G X 6 MM MISC Use to inject insulin daily as directed. 90 each 1   lipase/protease/amylase (CREON) 36000 UNITS CPEP capsule Take 2 capsules (72,000 Units total) by mouth 3 (three) times daily with meals. Take 2 capsules (72,000 Units total) by mouth 3 (three) times daily with meals and 36000 with snack (Patient taking differently: Take 36,000-72,000 Units by mouth See admin instructions. Take 2 capsules (72,000 Units total) by mouth 3 (three) times daily with each meal & take 1 capsule (36,000 units) with a snack) 270 capsule  1   lisinopril (ZESTRIL) 10 MG tablet Take 1 tablet (10 mg total) by mouth daily. 90 tablet 1   OVER THE COUNTER MEDICATION Take 1 capsule by mouth with breakfast, with lunch, and with evening meal. VISICLEAR  - [facility] Formula     pantoprazole (PROTONIX) 40 MG tablet TAKE 1 TABLET BY MOUTH AT BEDTIME 90 tablet 1   Red Yeast Rice 600 MG CAPS Take 600 mg by mouth 2 (two) times daily.     simethicone (MYLICON) 256 MG chewable tablet Chew 125 mg by mouth in the morning, at noon, in the evening, and at bedtime.     TRESIBA FLEXTOUCH 100 UNIT/ML FlexTouch Pen Inject 7 Units into the skin daily. Adjust dose as advised. (Patient taking differently: Inject 9 Units into the skin daily. Adjust dose as advised.) 3 mL 2   No current facility-administered medications for this visit.    Allergies Allergies  Allergen Reactions   Fentanyl Other (See Comments)    Patient is unsure of reaction type    Morphine And Related Other (See Comments)    Patient is unsure of reaction type      Histories Past Medical History:  Diagnosis Date   Anemia    Anxiety    Arthritis    Cardiac arrhythmia    Diabetes mellitus without complication ([place])    Hyperlipidemia    Hypertension    Pancreatic pseudocyst    Seizure ([place])    Stomach ulcer    Past Surgical History:  Procedure Laterality Date   BIOPSY  05/20/2020   Procedure: BIOPSY;  Surgeon: Irving Copas., MD;  Location: [facility];  Service: Gastroenterology;;   ESOPHAGOGASTRODUODENOSCOPY N/A 05/20/2020   Procedure: ESOPHAGOGASTRODUODENOSCOPY (EGD);  Surgeon: Irving Copas., MD;  Location: [facility];  Service: Gastroenterology;  Laterality: N/A;   LAPAROSCOPIC OVARIAN CYSTECTOMY     TONSILLECTOMY     UPPER ESOPHAGEAL ENDOSCOPIC ULTRASOUND (EUS) N/A 05/20/2020   Procedure: UPPER ESOPHAGEAL ENDOSCOPIC ULTRASOUND (EUS)  ;  Surgeon: Irving Copas., MD;  Location: [facility];  Service: Gastroenterology;  Laterality: N/A;   Social History   Socioeconomic History   Marital status: Widowed    Spouse name: Not on file   Number of children: 4   Years of education: Not on file   Highest education level: Not on file   Occupational History   Occupation: retired  Tobacco Use   Smoking status: Every Day    Packs/day: 0.25    Types: Cigarettes   Smokeless tobacco: Never  Vaping Use   Vaping Use: Never used  Substance and Sexual Activity   Alcohol use: Not Currently    Alcohol/week: 0.0 standard drinks    Comment: occasionally    Drug use: No   Sexual activity: Not on file  Other Topics Concern   Not on file  Social History Narrative   Not on file   Social Determinants of Health   Financial Resource Strain: Not on file  Food Insecurity: Not on file  Transportation Needs: Not on file  Physical Activity: Not on file  Stress: Not on file  Social Connections: Not on file  Intimate Partner Violence: Not on file   Family History  Problem Relation Age of Onset   Heart disease Mother    Kidney disease Mother    Diabetes Father    Pancreatic cancer Brother        Agent Orange   Diabetes Paternal Barbaraann Rondo  x 2   Colon cancer Son 78   Esophageal cancer Neg Hx    Inflammatory bowel disease Neg Hx    Liver disease Neg Hx    Stomach cancer Neg Hx    I have reviewed her medical, social, and family history in detail and updated the electronic medical record as necessary.    PHYSICAL EXAMINATION  BP (!) 172/66    Pulse 75    Ht 5' 6"  (1.676 m)    Wt 127 lb (57.6 kg)    SpO2 98%    BMI 20.50 kg/m  Wt Readings from Last 3 Encounters:  12/30/20 127 lb (57.6 kg)  12/11/20 127 lb 9.6 oz (57.9 kg)  10/14/20 128 lb 6 oz (58.2 kg)  GEN: NAD, appears stated age, nontoxic PSYCH: Cooperative, without pressured speech EYE: Conjunctivae pink, sclerae anicteric ENT: MMM CV: Nontachycardic RESP: No audible wheezing GI: NABS, soft, nontender throughout, no rebound or guarding MSK/EXT: No lower extremity edema SKIN: No jaundice NEURO:  Alert & Oriented x 3, no focal deficits   REVIEW OF DATA  I reviewed the following data at the time of this encounter:  GI Procedures and Studies  Previously  reviewed  Laboratory Studies  Reviewed those in epic and care everywhere  Imaging Studies  October 2022 CT abdomen with contrast IMPRESSION: 1. Slight enlargement of a complex pancreatic pseudocyst, with minimal inflammatory changes along the tail the pancreas. Associated chronic splenic vein thrombosis with collateral venous flow. 2. 3.1 cm infrarenal abdominal aortic aneurysm. Recommend follow-up every 3 years. Reference: J Am Coll Radiol 0802;23:361-224. 3. Cirrhosis. 4.  Aortic atherosclerosis (ICD10-I70.0).   ASSESSMENT  Holly Navarro is a 75 y.o. female with a pmh significant for hypertension, hyperlipidemia anxiety, arthritis peptic ulcer disease, seizures, AAA, insulin-dependent diabetes (secondary to severe pancreatitis), family history colon cancer (son), family history of pancreas cancer (brother), idiopathic pancreatitis in 4975 (complicated pancreatic pseudocyst with walled off necrosis, splenic vein thrombosis).  The patient is seen today for evaluation and management of:  1. Idiopathic acute pancreatitis with uninfected necrosis   2. Sterile pancreatic necrosis   3. Pancreatic cyst   4. Recurrent abdominal pain   5. Exocrine pancreatic insufficiency   6. Family history of colon cancer    The patient is hemodynamically and clinically stable currently.  The patient seems to be doing okay at this time.  She is not losing weight.  I do think that her symptoms occurring every 6 weeks or so does suggest that we may end up needing to go ahead and drain the cyst.  As time has passed, likelihood of more significant needs for necrosectomy decrease but there may still need to be some thought of that if cyst gastrostomy is performed.  We will need to continue to monitor her and also in the setting of this chronic splenic vein thrombosis, be prepared for the potential development of gastric varices.  If the cyst were to continue to enlarge then she could require EUS and ERCP if a pancreatic  duct disruption has occurred, I would have suspected over this long period of time, that we would probably have already seen a significant increase in her fluid status if that was the case.  For now she will continue Creon.  She will continue her insulin from an endocrine standpoint.  At some point in 2023, we should consider getting her up-to-date via colonoscopy due to her family history of colon cancer in her son.  I will tentatively set up  an EUS in February but see the patient 1 to 2 weeks before then to confirm whether she will need that procedure or not.  I can only perform a procedure if I think that I will be able to help the patient.  When the patient is doing well, going in and messing with a cyst may have more complications.  I have discussed that I am amenable to doing this but we need to have reasonings for it and we want to make sure that we do not cause more problems.  The risks of an EUS, including intestinal perforation, bleeding, infection, aspiration, and medication effects were discussed.  When a cystgastrostomy/cystenterostomy is performed as part of the EUS, there is an additional risk of pancreatitis at the rate of about 1-2%.  It was explained that procedure related pancreatitis is typically mild, although at times it can be severe and even life threatening.  All patient questions were answered to the best of my ability, and the patient agrees to the aforementioned plan of action with follow-up as indicated.   PLAN  Continue Creon 72,000 units with each meal and 36,000 units with each snack -When 2023 begins, she will have new insurance so we will send a new prescription as needed Continue low-fat diet moving forward Continue more frequent smaller meals during the day Follow-up in 6 weeks in clinic consider repeat cross-sectional imaging or moving forward with EUS's gastrostomy Try to stop all tobacco use Have held anticoagulation due to chronic nature splenic vein  thrombosis Colonoscopy in 2023 for high rescreening to be considered   Orders Placed This Encounter  Procedures   Procedural/ Surgical Case Request: UPPER ESOPHAGEAL ENDOSCOPIC ULTRASOUND (EUS)   Ambulatory referral to Gastroenterology    New Prescriptions   No medications on file   Modified Medications   No medications on file    Planned Follow Up No follow-ups on file.   Total Time in Face-to-Face and in Coordination of Care for patient including independent/personal interpretation/review of prior testing, medical history, examination, medication adjustment, review of imaging, communicating results with the patient directly, and documentation with the EHR is 30 minutes.   Justice Britain, MD [facility] Office # [phone]

## 2020-12-30 NOTE — Patient Instructions (Addendum)
You have been scheduled for an endoscopy. Please follow written instructions given to you at your visit today. If you use inhalers (even only as needed), please bring them with you on the day of your procedure.  If you are age 75 or older, your body mass index should be between 23-30. Your Body mass index is 20.5 kg/m. If this is out of the aforementioned range listed, please consider follow up with your Primary Care Provider.  If you are age 44 or younger, your body mass index should be between 19-25. Your Body mass index is 20.5 kg/m. If this is out of the aformentioned range listed, please consider follow up with your Primary Care Provider.   ________________________________________________________  The [facility] providers would like to encourage you to use [facility] to communicate with providers for non-urgent requests or questions.  Due to long hold times on the telephone, sending your provider a message by [facility] may be a faster and more efficient way to get a response.  Please allow 48 business hours for a response.  Please remember that this is for non-urgent requests.  _______________________________________________________  Please keep follow up 02/09/2021 at 1:50pm.  It was a pleasure to see you today!  Thank you for trusting me with your gastrointestinal care!

## 2020-12-31 ENCOUNTER — Encounter: Payer: Self-pay | Admitting: Gastroenterology

## 2021-02-09 ENCOUNTER — Encounter: Payer: Self-pay | Admitting: Gastroenterology

## 2021-02-09 ENCOUNTER — Ambulatory Visit: Payer: Medicare Other | Admitting: Gastroenterology

## 2021-02-09 VITALS — BP 170/90 | HR 60 | Ht 65.5 in | Wt 127.1 lb

## 2021-02-09 DIAGNOSIS — R109 Unspecified abdominal pain: Secondary | ICD-10-CM

## 2021-02-09 DIAGNOSIS — K862 Cyst of pancreas: Secondary | ICD-10-CM | POA: Diagnosis not present

## 2021-02-09 DIAGNOSIS — Z8719 Personal history of other diseases of the digestive system: Secondary | ICD-10-CM | POA: Diagnosis not present

## 2021-02-09 DIAGNOSIS — R6881 Early satiety: Secondary | ICD-10-CM | POA: Diagnosis not present

## 2021-02-09 DIAGNOSIS — M25552 Pain in left hip: Secondary | ICD-10-CM

## 2021-02-09 DIAGNOSIS — Z1211 Encounter for screening for malignant neoplasm of colon: Secondary | ICD-10-CM | POA: Diagnosis not present

## 2021-02-09 MED ORDER — PANCRELIPASE (LIP-PROT-AMYL) 36000-114000 UNITS PO CPEP: ORAL_CAPSULE | ORAL | 2 refills | Status: DC

## 2021-02-09 NOTE — Patient Instructions (Signed)
We have sent the following medications to your pharmacy for you to pick up at your convenience: Creon   You have been scheduled for an endoscopy. Please follow written instructions given to you at your visit today. If you use inhalers (even only as needed), please bring them with you on the day of your procedure.  If you are age 76 or older, your body mass index should be between 23-30. Your Body mass index is 20.83 kg/m. If this is out of the aforementioned range listed, please consider follow up with your Primary Care Provider.  If you are age 84 or younger, your body mass index should be between 19-25. Your Body mass index is 20.83 kg/m. If this is out of the aformentioned range listed, please consider follow up with your Primary Care Provider.   ________________________________________________________  The [facility] providers would like to encourage you to use [facility] to communicate with providers for non-urgent requests or questions.  Due to long hold times on the telephone, sending your provider a message by [facility] may be a faster and more efficient way to get a response.  Please allow 48 business hours for a response.  Please remember that this is for non-urgent requests.  _______________________________________________________  Thank you for choosing me and [facility].  Dr. Meridee Score

## 2021-02-10 ENCOUNTER — Encounter: Payer: Self-pay | Admitting: Gastroenterology

## 2021-02-10 DIAGNOSIS — Z8719 Personal history of other diseases of the digestive system: Secondary | ICD-10-CM | POA: Insufficient documentation

## 2021-02-10 DIAGNOSIS — Z7689 Persons encountering health services in other specified circumstances: Secondary | ICD-10-CM | POA: Insufficient documentation

## 2021-02-10 DIAGNOSIS — R109 Unspecified abdominal pain: Secondary | ICD-10-CM | POA: Insufficient documentation

## 2021-02-10 DIAGNOSIS — M25552 Pain in left hip: Secondary | ICD-10-CM | POA: Insufficient documentation

## 2021-02-10 DIAGNOSIS — Z1211 Encounter for screening for malignant neoplasm of colon: Secondary | ICD-10-CM | POA: Insufficient documentation

## 2021-02-10 NOTE — Progress Notes (Signed)
[place] VISIT   Primary Care Provider Jearld Fenton, NP [place] [phone]   Patient Profile: Holly Navarro is a 76 y.o. 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  The patient presents to the [facility] for an evaluation and management of problem(s) noted below:  Problem List 1. Pancreatic cyst   2. History of pancreatitis   3. Early satiety   4. Abdominal pressure   5. Colon cancer screening   6. Left hip pain      History of Present Illness Please see prior notes for full details of HPI.  Interval History The patient returns for follow-up.  The patient has overall been doing okay.  She is feeling increased abdominal pressure upon bending and moving.  Pain has not been a severe issue as it had been previously.  She has had a stable weight.  She does still have early satiety but forces herself to eat.  She did have some left hip pain that occurred within the last few weeks that kept her from moving for a few days but after taking an NSAID and Tylenol she had improvement.  She is going to follow-up with her PCP about this.  She feels good about potential cyst drainage in the coming weeks.  She denies any fevers or chills.  GI Review of Systems Positive as above including bloating at times Negative for dysphagia, odynophagia, alteration of bowel habits, bloating, melena, hematochezia  Review of Systems General: Denies fevers/chills/unintentional weight loss Cardiovascular: Denies chest pain/palpitations Pulmonary: Denies shortness of breath Gastroenterological: See  HPI Genitourinary: Denies darkened urine Hematological: Denies easy bruising/bleeding Dermatological: Denies jaundice Psychological: Mood is stable   Medications Current Outpatient Medications  Medication Sig Dispense Refill   atenolol (TENORMIN) 50 MG tablet Take 1 tablet by mouth once daily 90 tablet 1   blood glucose meter kit and supplies KIT Dispense based on patient and insurance preference. Use up to four times daily as directed. 1 each 0   cimetidine (TAGAMET) 200 MG tablet Take 200 mg by mouth 2 (two) times daily as needed.     Coenzyme Q10 (COQ10) 100 MG CAPS Take 100 mg by mouth in the morning.     ECHINACEA PO Take 900 mg by mouth in the morning.     fluticasone (FLONASE) 50 MCG/ACT nasal spray Place 2 sprays into both nostrils daily. (Patient taking differently: Place 2 sprays into both nostrils daily as needed.) 15.8 mL 2   Insulin Pen Needle (NOVOFINE PEN NEEDLE) 32G X 6 MM MISC Use to inject insulin daily as directed. 90 each 1   lipase/protease/amylase (CREON) 36000 UNITS CPEP capsule Take 2 capsules (72,000 Units total) by mouth 3 (three) times daily with meals. May also take 1 capsule (36,000 Units total) as needed (with snacks). 720 capsule 2   lisinopril (ZESTRIL) 10 MG tablet Take 1 tablet (10 mg total) by mouth daily. 90 tablet 1   OVER THE COUNTER MEDICATION Take 1 capsule by mouth with breakfast, with lunch, and with evening meal. VISICLEAR - [facility] Formula     pantoprazole (PROTONIX) 40 MG tablet TAKE 1 TABLET BY MOUTH AT BEDTIME 90 tablet 1   Red Yeast Rice 600 MG CAPS  Take 600 mg by mouth 2 (two) times daily.     simethicone (MYLICON) 753 MG chewable tablet Chew 125 mg by mouth in the morning, at noon, in the evening, and at bedtime.     TRESIBA FLEXTOUCH 100 UNIT/ML FlexTouch Pen Inject 7 Units into the skin daily. Adjust dose as advised. (Patient taking differently: Inject 9 Units into the skin daily. Adjust dose as advised.) 3 mL 2   No current  facility-administered medications for this visit.    Allergies Allergies  Allergen Reactions   Fentanyl Other (See Comments)    Patient is unsure of reaction type    Morphine And Related Other (See Comments)    Patient is unsure of reaction type      Histories Past Medical History:  Diagnosis Date   Anemia    Anxiety    Arthritis    Cardiac arrhythmia    Diabetes mellitus without complication ([place])    Hyperlipidemia    Hypertension    Pancreatic pseudocyst    Seizure ([place])    Stomach ulcer    Past Surgical History:  Procedure Laterality Date   BIOPSY  05/20/2020   Procedure: BIOPSY;  Surgeon: Irving Copas., MD;  Location: [facility];  Service: Gastroenterology;;   ESOPHAGOGASTRODUODENOSCOPY N/A 05/20/2020   Procedure: ESOPHAGOGASTRODUODENOSCOPY (EGD);  Surgeon: Irving Copas., MD;  Location: [facility];  Service: Gastroenterology;  Laterality: N/A;   LAPAROSCOPIC OVARIAN CYSTECTOMY     TONSILLECTOMY     UPPER ESOPHAGEAL ENDOSCOPIC ULTRASOUND (EUS) N/A 05/20/2020   Procedure: UPPER ESOPHAGEAL ENDOSCOPIC ULTRASOUND (EUS)  ;  Surgeon: Irving Copas., MD;  Location: [facility];  Service: Gastroenterology;  Laterality: N/A;   Social History   Socioeconomic History   Marital status: Widowed    Spouse name: Not on file   Number of children: 4   Years of education: Not on file   Highest education level: Not on file  Occupational History   Occupation: retired  Tobacco Use   Smoking status: Every Day    Packs/day: 0.25    Types: Cigarettes   Smokeless tobacco: Never  Vaping Use   Vaping Use: Never used  Substance and Sexual Activity   Alcohol use: Not Currently    Alcohol/week: 0.0 standard drinks    Comment: occasionally    Drug use: No   Sexual activity: Not on file  Other Topics Concern   Not on file  Social History Narrative   Not on file   Social Determinants of Health   Financial Resource Strain: Not on file  Food  Insecurity: Not on file  Transportation Needs: Not on file  Physical Activity: Not on file  Stress: Not on file  Social Connections: Not on file  Intimate Partner Violence: Not on file   Family History  Problem Relation Age of Onset   Heart disease Mother    Kidney disease Mother    Diabetes Father    Pancreatic cancer Brother        Agent Orange   Gallstones Daughter    Colon cancer Son 57   Diabetes Paternal Uncle        x 2   Esophageal cancer Neg Hx    Inflammatory bowel disease Neg Hx    Liver disease Neg Hx    Stomach cancer Neg Hx    I have reviewed her medical, social, and family history in detail and updated the electronic medical record as necessary.    PHYSICAL EXAMINATION  BP Marland Kitchen)  170/90 (BP Location: Left Arm, Patient Position: Sitting, Cuff Size: Normal)    Pulse 60    Ht 5' 5.5" (1.664 m)    Wt 127 lb 2 oz (57.7 kg)    BMI 20.83 kg/m  Wt Readings from Last 3 Encounters:  02/09/21 127 lb 2 oz (57.7 kg)  12/30/20 127 lb (57.6 kg)  12/11/20 127 lb 9.6 oz (57.9 kg)  GEN: NAD, appears stated age, nontoxic PSYCH: Cooperative, without pressured speech EYE: Conjunctivae pink, sclerae anicteric ENT: MMM CV: Nontachycardic RESP: No audible wheezing GI: NABS, soft, nontender throughout, no rebound or guarding MSK/EXT: No lower extremity edema SKIN: No jaundice NEURO:  Alert & Oriented x 3, no focal deficits   REVIEW OF DATA  I reviewed the following data at the time of this encounter:  GI Procedures and Studies  Previously reviewed  Laboratory Studies  Reviewed those in epic and care everywhere  Imaging Studies  December 2022 CT abdomen pelvis without contrast IMPRESSION: 1. Mild contraction of low-density fluid collection expanding the body of the pancreas. Findings most consistent with persistent pseudocyst of the pancreatic parenchyma. 2. No evidence of acute pancreatitis. 3. 3 cm infrarenal abdominal aortic aneurysm. Recommend follow-up every 3  years. Reference: J Am Coll Radiol 9449;67:591-638.   ASSESSMENT  Ms. Alderman is a 76 y.o. 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  The patient is seen today for evaluation and management of:  1. Pancreatic cyst   2. History of pancreatitis   3. Early satiety   4. Abdominal pressure   5. Colon cancer screening   6. Left hip pain    The patient is clinically and hemodynamically stable.  Early satiety symptoms persist though thankfully she has been able to maintain her weight.  She has had increased abdominal pressure when bending over which I suspect is a result of the pseudocyst somewhat.  Her pseudocyst is large only because it extends across the entire pancreatic parenchyma.  It is not just in one particular location.  Typical AXIOS stenting may not be possible but I think it is worth a repeat attempt.  If it is not possible then we will refer her for evaluation and attempt at double-pigtail stenting at one of the quatern[facility] centers.  I do think it is worth pursuing in the setting of her early satiety with the hope that we can increase her weight as well.  We will need to be thoughtful as she has a chronic splenic vein thrombosis so the potential of gastric varices that could make it difficult to perform an EUS cyst gastrostomy needs also be considered.  If we do drain the cyst and she has recurrence, we will need to consider pancreatic duct disruption.  The risks of an EUS, including intestinal perforation, bleeding, infection, aspiration, and medication effects were discussed.  When a cystgastrostomy/cystenterostomy is performed as part of the EUS, there is an  additional risk of pancreatitis at the rate of about 1-2%.  It was explained that procedure related pancreatitis is typically mild, although at times it can be severe and even life threatening.  The risks and benefits of endoscopic evaluation were discussed with the patient; these include but are not limited to the risk of perforation, infection, bleeding, missed lesions, lack of  diagnosis, severe illness requiring hospitalization, as well as anesthesia and sedation related illnesses.  The patient and/or family is agreeable to proceed.  We are going to be refilling her Creon at her current dose.  We did discuss the need for colon cancer screening, and today she feels that she does not want to even consider colonoscopy at this point.  She is not an average risk individual due to her young son who had colon cancer but she is not sure that she ever wants to undergo a colonoscopy.  We did discuss the consideration of Cologuard testing, even though she would not be the typical person as she is not an average risk individual, but she is more amenable to considering that.  She wants to try and get through this pancreas issue first and then talk about this further later in the year.  All patient questions were answered to the best of my ability, and the patient agrees to the aforementioned plan of action with follow-up as indicated.   PLAN  New prescription for Creon 72,000 units with each meal and 36,000 units with each snack Continue low-fat diet moving forward Continue more frequent smaller meals during the day Proceed with scheduled EUS cystgastrostomy attempt later this month - If unable to perform via AXIOS may need to send to [facility] for double-pigtail stent placement Try to stop all tobacco use Have held anticoagulation due to chronic nature splenic vein thrombosis Colon cancer screening be reassessed again at follow-up, she is not sure that she wants to undergo colonoscopy but could consider  Cologuard (though with her son having had colon cancer she is not a typical person that we would use Cologuard on but at least it could be a means of trying to get her screened) Follow-up with PCP if left hip pain persist Follow-up with PCP in regards to diabetes management   No orders of the defined types were placed in this encounter.   New Prescriptions   LIPASE/PROTEASE/AMYLASE (CREON) 36000 UNITS CPEP CAPSULE    Take 2 capsules (72,000 Units total) by mouth 3 (three) times daily with meals. May also take 1 capsule (36,000 Units total) as needed (with snacks).   Modified Medications   No medications on file    Planned Follow Up No follow-ups on file.   Total Time in Face-to-Face and in Coordination of Care for patient including independent/personal interpretation/review of prior testing, medical history, examination, medication adjustment, review of imaging, communicating results with the patient directly, and documentation with the EHR is 25 minutes.   Justice Britain, MD [facility] Office # [phone]

## 2021-02-10 NOTE — H&P (View-Only) (Signed)
[facility] VISIT   Primary Care Provider Jearld Fenton, NP [place] [address] [phone]   Patient Profile: Holly Navarro is a 76 y.o. 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  The patient presents to the [facility] for an evaluation and management of problem(s) noted below:  Problem List 1. Pancreatic cyst   2. History of pancreatitis   3. Early satiety   4. Abdominal pressure   5. Colon cancer screening   6. Left hip pain      History of Present Illness Please see prior notes for full details of HPI.  Interval History The patient returns for follow-up.  The patient has overall been doing okay.  She is feeling increased abdominal pressure upon bending and moving.  Pain has not been a severe issue as it had been previously.  She has had a stable weight.  She does still have early satiety but forces herself to eat.  She did have some left hip pain that occurred within the last few weeks that kept her from moving for a few days but after taking an NSAID and Tylenol she had improvement.  She is going to follow-up with her PCP about this.  She feels good about potential cyst drainage in the coming weeks.  She denies any fevers or chills.  GI Review of Systems Positive as above including bloating at times Negative for dysphagia, odynophagia, alteration of bowel habits, bloating, melena, hematochezia  Review of Systems General: Denies fevers/chills/unintentional weight loss Cardiovascular: Denies chest pain/palpitations Pulmonary: Denies shortness of breath Gastroenterological: See  HPI Genitourinary: Denies darkened urine Hematological: Denies easy bruising/bleeding Dermatological: Denies jaundice Psychological: Mood is stable   Medications Current Outpatient Medications  Medication Sig Dispense Refill   atenolol (TENORMIN) 50 MG tablet Take 1 tablet by mouth once daily 90 tablet 1   blood glucose meter kit and supplies KIT Dispense based on patient and insurance preference. Use up to four times daily as directed. 1 each 0   cimetidine (TAGAMET) 200 MG tablet Take 200 mg by mouth 2 (two) times daily as needed.     Coenzyme Q10 (COQ10) 100 MG CAPS Take 100 mg by mouth in the morning.     ECHINACEA PO Take 900 mg by mouth in the morning.     fluticasone (FLONASE) 50 MCG/ACT nasal spray Place 2 sprays into both nostrils daily. (Patient taking differently: Place 2 sprays into both nostrils daily as needed.) 15.8 mL 2   Insulin Pen Needle (NOVOFINE PEN NEEDLE) 32G X 6 MM MISC Use to inject insulin daily as directed. 90 each 1   lipase/protease/amylase (CREON) 36000 UNITS CPEP capsule Take 2 capsules (72,000 Units total) by mouth 3 (three) times daily with meals. May also take 1 capsule (36,000 Units total) as needed (with snacks). 720 capsule 2   lisinopril (ZESTRIL) 10 MG tablet Take 1 tablet (10 mg total) by mouth daily. 90 tablet 1   OVER THE COUNTER MEDICATION Take 1 capsule by mouth with breakfast, with lunch, and with evening meal. VISICLEAR - [facility] Formula     pantoprazole (PROTONIX) 40 MG tablet TAKE 1 TABLET BY MOUTH AT BEDTIME 90 tablet 1   Red Yeast Rice 600 MG CAPS  Take 600 mg by mouth 2 (two) times daily.     simethicone (MYLICON) 147 MG chewable tablet Chew 125 mg by mouth in the morning, at noon, in the evening, and at bedtime.     TRESIBA FLEXTOUCH 100 UNIT/ML FlexTouch Pen Inject 7 Units into the skin daily. Adjust dose as advised. (Patient taking differently: Inject 9 Units into the skin daily. Adjust dose as advised.) 3 mL 2   No current  facility-administered medications for this visit.    Allergies Allergies  Allergen Reactions   Fentanyl Other (See Comments)    Patient is unsure of reaction type    Morphine And Related Other (See Comments)    Patient is unsure of reaction type      Histories Past Medical History:  Diagnosis Date   Anemia    Anxiety    Arthritis    Cardiac arrhythmia    Diabetes mellitus without complication ([place])    Hyperlipidemia    Hypertension    Pancreatic pseudocyst    Seizure ([place])    Stomach ulcer    Past Surgical History:  Procedure Laterality Date   BIOPSY  05/20/2020   Procedure: BIOPSY;  Surgeon: Irving Copas., MD;  Location: [facility];  Service: Gastroenterology;;   ESOPHAGOGASTRODUODENOSCOPY N/A 05/20/2020   Procedure: ESOPHAGOGASTRODUODENOSCOPY (EGD);  Surgeon: Irving Copas., MD;  Location: [facility];  Service: Gastroenterology;  Laterality: N/A;   LAPAROSCOPIC OVARIAN CYSTECTOMY     TONSILLECTOMY     UPPER ESOPHAGEAL ENDOSCOPIC ULTRASOUND (EUS) N/A 05/20/2020   Procedure: UPPER ESOPHAGEAL ENDOSCOPIC ULTRASOUND (EUS)  ;  Surgeon: Irving Copas., MD;  Location: [facility];  Service: Gastroenterology;  Laterality: N/A;   Social History   Socioeconomic History   Marital status: Widowed    Spouse name: Not on file   Number of children: 4   Years of education: Not on file   Highest education level: Not on file  Occupational History   Occupation: retired  Tobacco Use   Smoking status: Every Day    Packs/day: 0.25    Types: Cigarettes   Smokeless tobacco: Never  Vaping Use   Vaping Use: Never used  Substance and Sexual Activity   Alcohol use: Not Currently    Alcohol/week: 0.0 standard drinks    Comment: occasionally    Drug use: No   Sexual activity: Not on file  Other Topics Concern   Not on file  Social History Narrative   Not on file   Social Determinants of Health   Financial Resource Strain: Not on file  Food  Insecurity: Not on file  Transportation Needs: Not on file  Physical Activity: Not on file  Stress: Not on file  Social Connections: Not on file  Intimate Partner Violence: Not on file   Family History  Problem Relation Age of Onset   Heart disease Mother    Kidney disease Mother    Diabetes Father    Pancreatic cancer Brother        Agent Orange   Gallstones Daughter    Colon cancer Son 68   Diabetes Paternal Uncle        x 2   Esophageal cancer Neg Hx    Inflammatory bowel disease Neg Hx    Liver disease Neg Hx    Stomach cancer Neg Hx    I have reviewed her medical, social, and family history in detail and updated the electronic medical record as necessary.    PHYSICAL EXAMINATION  BP Marland Kitchen)  170/90 (BP Location: Left Arm, Patient Position: Sitting, Cuff Size: Normal)    Pulse 60    Ht 5' 5.5" (1.664 m)    Wt 127 lb 2 oz (57.7 kg)    BMI 20.83 kg/m  Wt Readings from Last 3 Encounters:  02/09/21 127 lb 2 oz (57.7 kg)  12/30/20 127 lb (57.6 kg)  12/11/20 127 lb 9.6 oz (57.9 kg)  GEN: NAD, appears stated age, nontoxic PSYCH: Cooperative, without pressured speech EYE: Conjunctivae pink, sclerae anicteric ENT: MMM CV: Nontachycardic RESP: No audible wheezing GI: NABS, soft, nontender throughout, no rebound or guarding MSK/EXT: No lower extremity edema SKIN: No jaundice NEURO:  Alert & Oriented x 3, no focal deficits   REVIEW OF DATA  I reviewed the following data at the time of this encounter:  GI Procedures and Studies  Previously reviewed  Laboratory Studies  Reviewed those in epic and care everywhere  Imaging Studies  December 2022 CT abdomen pelvis without contrast IMPRESSION: 1. Mild contraction of low-density fluid collection expanding the body of the pancreas. Findings most consistent with persistent pseudocyst of the pancreatic parenchyma. 2. No evidence of acute pancreatitis. 3. 3 cm infrarenal abdominal aortic aneurysm. Recommend follow-up every 3  years. Reference: J Am Coll Radiol 6195;09:326-712.   ASSESSMENT  Ms. Spelman is a 76 y.o. 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  The patient is seen today for evaluation and management of:  1. Pancreatic cyst   2. History of pancreatitis   3. Early satiety   4. Abdominal pressure   5. Colon cancer screening   6. Left hip pain    The patient is clinically and hemodynamically stable.  Early satiety symptoms persist though thankfully she has been able to maintain her weight.  She has had increased abdominal pressure when bending over which I suspect is a result of the pseudocyst somewhat.  Her pseudocyst is large only because it extends across the entire pancreatic parenchyma.  It is not just in one particular location.  Typical AXIOS stenting may not be possible but I think it is worth a repeat attempt.  If it is not possible then we will refer her for evaluation and attempt at double-pigtail stenting at one of the quaternary centers.  I do think it is worth pursuing in the setting of her early satiety with the hope that we can increase her weight as well.  We will need to be thoughtful as she has a chronic splenic vein thrombosis so the potential of gastric varices that could make it difficult to perform an EUS cyst gastrostomy needs also be considered.  If we do drain the cyst and she has recurrence, we will need to consider pancreatic duct disruption.  The risks of an EUS, including intestinal perforation, bleeding, infection, aspiration, and medication effects were discussed.  When a cystgastrostomy/cystenterostomy is performed as part of the EUS, there is an  additional risk of pancreatitis at the rate of about 1-2%.  It was explained that procedure related pancreatitis is typically mild, although at times it can be severe and even life threatening.  The risks and benefits of endoscopic evaluation were discussed with the patient; these include but are not limited to the risk of perforation, infection, bleeding, missed lesions, lack of  diagnosis, severe illness requiring hospitalization, as well as anesthesia and sedation related illnesses.  The patient and/or family is agreeable to proceed.  We are going to be refilling her Creon at her current dose.  We did discuss the need for colon cancer screening, and today she feels that she does not want to even consider colonoscopy at this point.  She is not an average risk individual due to her young son who had colon cancer but she is not sure that she ever wants to undergo a colonoscopy.  We did discuss the consideration of Cologuard testing, even though she would not be the typical person as she is not an average risk individual, but she is more amenable to considering that.  She wants to try and get through this pancreas issue first and then talk about this further later in the year.  All patient questions were answered to the best of my ability, and the patient agrees to the aforementioned plan of action with follow-up as indicated.   PLAN  New prescription for Creon 72,000 units with each meal and 36,000 units with each snack Continue low-fat diet moving forward Continue more frequent smaller meals during the day Proceed with scheduled EUS cystgastrostomy attempt later this month - If unable to perform via AXIOS may need to send to [place] center for double-pigtail stent placement Try to stop all tobacco use Have held anticoagulation due to chronic nature splenic vein thrombosis Colon cancer screening be reassessed again at follow-up, she is not sure that she wants to undergo colonoscopy but could consider  Cologuard (though with her son having had colon cancer she is not a typical person that we would use Cologuard on but at least it could be a means of trying to get her screened) Follow-up with PCP if left hip pain persist Follow-up with PCP in regards to diabetes management   No orders of the defined types were placed in this encounter.   New Prescriptions   LIPASE/PROTEASE/AMYLASE (CREON) 36000 UNITS CPEP CAPSULE    Take 2 capsules (72,000 Units total) by mouth 3 (three) times daily with meals. May also take 1 capsule (36,000 Units total) as needed (with snacks).   Modified Medications   No medications on file    Planned Follow Up No follow-ups on file.   Total Time in Face-to-Face and in Coordination of Care for patient including independent/personal interpretation/review of prior testing, medical history, examination, medication adjustment, review of imaging, communicating results with the patient directly, and documentation with the EHR is 25 minutes.   Justice Britain, MD [facility] Office # [phone]

## 2021-02-11 ENCOUNTER — Other Ambulatory Visit: Payer: Self-pay | Admitting: Internal Medicine

## 2021-02-11 DIAGNOSIS — I10 Essential (primary) hypertension: Secondary | ICD-10-CM

## 2021-02-11 DIAGNOSIS — Z8719 Personal history of other diseases of the digestive system: Secondary | ICD-10-CM

## 2021-02-11 DIAGNOSIS — R739 Hyperglycemia, unspecified: Secondary | ICD-10-CM

## 2021-02-11 MED ORDER — ATENOLOL 50 MG PO TABS: 50.0000 mg | ORAL_TABLET | Freq: Every day | ORAL | 1 refills | Status: DC

## 2021-02-11 MED ORDER — LISINOPRIL 10 MG PO TABS: 10.0000 mg | ORAL_TABLET | Freq: Every day | ORAL | 1 refills | Status: DC

## 2021-02-11 MED ORDER — TRESIBA FLEXTOUCH 100 UNIT/ML ~~LOC~~ SOPN: 7.0000 [IU] | PEN_INJECTOR | Freq: Every day | SUBCUTANEOUS | 2 refills | Status: DC

## 2021-02-11 NOTE — Telephone Encounter (Signed)
Medication Refill - Medication:  atenolol (TENORMIN) 50 MG tablet  TRESIBA FLEXTOUCH 100 UNIT/ML FlexTouch Pen  lisinopril (ZESTRIL) 10 MG tablet  fluticasone (FLONASE) 50 MCG/ACT nasal spray   Has the patient contacted their pharmacy? Yes.   Contact PCP  Preferred Pharmacy (with phone number or street name):  [facility] [address] ([address]), [place] - [address]  [address]) [place] [address]  Phone:  [phone]  Fax:  [phone]   Has the patient been seen for an appointment in the last year OR does the patient have an upcoming appointment? Yes.    Agent: Please be advised that RX refills may take up to 3 business days. We ask that you follow-up with your pharmacy.

## 2021-02-11 NOTE — Telephone Encounter (Signed)
Requested Prescriptions  Pending Prescriptions Disp Refills   TRESIBA FLEXTOUCH 100 UNIT/ML FlexTouch Pen 3 mL 2    Sig: Inject 7 Units into the skin daily. Adjust dose as advised.     Endocrinology:  Diabetes - Insulins Passed - 02/11/2021  1:27 PM      Passed - HBA1C is between 0 and 7.9 and within 180 days    Hgb A1c MFr Bld  Date Value Ref Range Status  12/11/2020 7.3 (H) <5.7 % of total Hgb Final    Comment:    For someone without known diabetes, a hemoglobin A1c value of 6.5% or greater indicates that they may have  diabetes and this should be confirmed with a follow-up  test. . For someone with known diabetes, a value <7% indicates  that their diabetes is well controlled and a value  greater than or equal to 7% indicates suboptimal  control. A1c targets should be individualized based on  duration of diabetes, age, comorbid conditions, and  other considerations. . Currently, no consensus exists regarding use of hemoglobin A1c for diagnosis of diabetes for children. Renella Cunas - Valid encounter within last 6 months    Recent Outpatient Visits          2 months ago Medicare annual wellness visit, subsequent   [facility] [place], [place], NP   7 months ago Acute on chronic pancreatitis [facility])   [facility] [place], Coralie Keens, NP   8 months ago Gastroesophageal reflux disease, unspecified whether esophagitis present   [facility] [place], Coralie Keens, NP   10 months ago Hyperglycemia   [place], DO   11 months ago Acute necrotizing pancreatitis   [place], DO              atenolol (TENORMIN) 50 MG tablet 90 tablet 1    Sig: Take 1 tablet (50 mg total) by mouth daily.     Cardiovascular: Beta Blockers 2 Failed - 02/11/2021  1:27 PM      Failed - Cr in normal range and within 360 days    Creat  Date Value Ref Range Status   12/11/2020 1.24 (H) 0.60 - 1.00 mg/dL Final         Failed - Last BP in normal range    BP Readings from Last 1 Encounters:  02/09/21 (!) 170/90         Passed - Last Heart Rate in normal range    Pulse Readings from Last 1 Encounters:  02/09/21 60         Passed - Valid encounter within last 6 months    Recent Outpatient Visits          2 months ago Medicare annual wellness visit, subsequent   [facility] [place], Coralie Keens, NP   7 months ago Acute on chronic pancreatitis [facility])   [facility] [place], Coralie Keens, NP   8 months ago Gastroesophageal reflux disease, unspecified whether esophagitis present   [facility] [place], Coralie Keens, NP   10 months ago Hyperglycemia   [place], DO   11 months ago Acute necrotizing pancreatitis   [place], DO              lisinopril (ZESTRIL) 10 MG tablet 90  tablet 1    Sig: Take 1 tablet (10 mg total) by mouth daily.     Cardiovascular:  ACE Inhibitors Failed - 02/11/2021  1:27 PM      Failed - Cr in normal range and within 180 days    Creat  Date Value Ref Range Status  12/11/2020 1.24 (H) 0.60 - 1.00 mg/dL Final         Failed - Last BP in normal range    BP Readings from Last 1 Encounters:  02/09/21 (!) 170/90         Passed - K in normal range and within 180 days    Potassium  Date Value Ref Range Status  12/11/2020 4.3 3.5 - 5.3 mmol/L Final         Passed - Patient is not pregnant      Passed - Valid encounter within last 6 months    Recent Outpatient Visits          2 months ago Medicare annual wellness visit, subsequent   [facility] [place], Coralie Keens, NP   7 months ago Acute on chronic pancreatitis [facility])   [facility] [place], Coralie Keens, NP   8 months ago Gastroesophageal reflux disease, unspecified whether esophagitis present   [facility]  [place], Coralie Keens, NP   10 months ago Hyperglycemia   [place], DO   11 months ago Acute necrotizing pancreatitis   [place], DO              fluticasone [facility]) 50 MCG/ACT nasal spray 15.8 mL 2    Sig: Place 2 sprays into both nostrils daily.     Not Delegated - Ear, Nose, and Throat: Nasal Preparations - Corticosteroids Failed - 02/11/2021  1:27 PM      Failed - This refill cannot be delegated      Passed - Valid encounter within last 12 months    Recent Outpatient Visits          2 months ago Medicare annual wellness visit, subsequent   [facility] [place], Coralie Keens, NP   7 months ago Acute on chronic pancreatitis [facility])   [facility] [place], Coralie Keens, NP   8 months ago Gastroesophageal reflux disease, unspecified whether esophagitis present   [facility] [place], Coralie Keens, NP   10 months ago Hyperglycemia   [place], DO   11 months ago Acute necrotizing pancreatitis   [place], DO

## 2021-02-11 NOTE — Telephone Encounter (Signed)
Requested medications are due for refill today.  yes  Requested medications are on the active medications list.  yes  Last refill. 09/28/2020 15.8/2 refills  Future visit scheduled.   no  Notes to clinic.  Medication not delegated.    Requested Prescriptions  Pending Prescriptions Disp Refills   fluticasone (FLONASE) 50 MCG/ACT nasal spray 15.8 mL 2    Sig: Place 2 sprays into both nostrils daily.     Not Delegated - Ear, Nose, and Throat: Nasal Preparations - Corticosteroids Failed - 02/11/2021  1:27 PM      Failed - This refill cannot be delegated      Passed - Valid encounter within last 12 months    Recent Outpatient Visits           2 months ago Medicare annual wellness visit, subsequent   [facility] [place], Salv[place], NP   7 months ago Acute on chronic pancreatitis [facility])   [facility] [place], Salvadore [place], NP   8 months ago Gastroesophageal reflux disease, unspecified whether esophagitis present   [facility] [place], Salvadore [place], NP   10 months ago Hyperglycemia   [facility] [place], DO   11 months ago Acute necrotizing pancreatitis   [facility] [place], Netta Neat, DO              Signed Prescriptions Disp Refills   TRESIBA FLEXTOUCH 100 UNIT/ML FlexTouch Pen 3 mL 2    Sig: Inject 7 Units into the skin daily. Adjust dose as advised.     Endocrinology:  Diabetes - Insulins Passed - 02/11/2021  1:27 PM      Passed - HBA1C is between 0 and 7.9 and within 180 days    Hgb A1c MFr Bld  Date Value Ref Range Status  12/11/2020 7.3 (H) <5.7 % of total Hgb Final    Comment:    For someone without known diabetes, a hemoglobin A1c value of 6.5% or greater indicates that they may have  diabetes and this should be confirmed with a follow-up  test. . For someone with known diabetes, a value <7% indicates  that their diabetes is well controlled and a value  greater than or equal  to 7% indicates suboptimal  control. A1c targets should be individualized based on  duration of diabetes, age, comorbid conditions, and  other considerations. . Currently, no consensus exists regarding use of hemoglobin A1c for diagnosis of diabetes for children. Verna Czech - Valid encounter within last 6 months    Recent Outpatient Visits           2 months ago Medicare annual wellness visit, subsequent   [facility] [place], [place], NP   7 months ago Acute on chronic pancreatitis [facility])   [facility] [place], Salvadore [place], NP   8 months ago Gastroesophageal reflux disease, unspecified whether esophagitis present   [facility] [place], Salvadore Oxford, NP   10 months ago Hyperglycemia   [facility] [place] [facility] [place], DO   11 months ago Acute necrotizing pancreatitis   [facility], Netta Neat, DO               atenolol (TENORMIN) 50 MG tablet 90 tablet 1    Sig: Take 1 tablet (50 mg total) by mouth daily.     Cardiovascular: Beta  Blockers 2 Failed - 02/11/2021  1:27 PM      Failed - Cr in normal range and within 360 days    Creat  Date Value Ref Range Status  12/11/2020 1.24 (H) 0.60 - 1.00 mg/dL Final          Failed - Last BP in normal range    BP Readings from Last 1 Encounters:  02/09/21 (!) 170/90          Passed - Last Heart Rate in normal range    Pulse Readings from Last 1 Encounters:  02/09/21 60          Passed - Valid encounter within last 6 months    Recent Outpatient Visits           2 months ago Medicare annual wellness visit, subsequent   [facility] [place], Salvadore [place], NP   7 months ago Acute on chronic pancreatitis [facility])   [facility] [place], Salvadore [place], NP   8 months ago Gastroesophageal reflux disease, unspecified whether esophagitis present   [facility] [place], Salvadore [place], NP   10 months ago  Hyperglycemia   [facility], DO   11 months ago Acute necrotizing pancreatitis   [facility], Netta Neat, DO               lisinopril (ZESTRIL) 10 MG tablet 90 tablet 1    Sig: Take 1 tablet (10 mg total) by mouth daily.     Cardiovascular:  ACE Inhibitors Failed - 02/11/2021  1:27 PM      Failed - Cr in normal range and within 180 days    Creat  Date Value Ref Range Status  12/11/2020 1.24 (H) 0.60 - 1.00 mg/dL Final          Failed - Last BP in normal range    BP Readings from Last 1 Encounters:  02/09/21 (!) 170/90          Passed - K in normal range and within 180 days    Potassium  Date Value Ref Range Status  12/11/2020 4.3 3.5 - 5.3 mmol/L Final          Passed - Patient is not pregnant      Passed - Valid encounter within last 6 months    Recent Outpatient Visits           2 months ago Medicare annual wellness visit, subsequent   [facility] [place], Salvadore Oxford, NP   7 months ago Acute on chronic pancreatitis [facility])   [facility] [place], Salvadore Oxford, NP   8 months ago Gastroesophageal reflux disease, unspecified whether esophagitis present   [facility] [place], Salvadore Oxford, NP   10 months ago Hyperglycemia   [facility] Smit[place] Cords, DO   11 months ago Acute necrotizing pancreatitis   [facility] [place], Netta Neat, DO

## 2021-02-11 NOTE — Telephone Encounter (Signed)
[place] called and spoke to [place], [facility] about the refill(s) Atenolol requested. Advised it was sent on 12/11/20 #90/1 refill(s). She says it wasn't received.

## 2021-02-12 MED ORDER — FLUTICASONE PROPIONATE 50 MCG/ACT NA SUSP: 2.0000 | Freq: Every day | NASAL | 1 refills | Status: AC | PRN

## 2021-02-17 ENCOUNTER — Encounter (HOSPITAL_COMMUNITY): Payer: Self-pay | Admitting: Gastroenterology

## 2021-02-24 NOTE — Anesthesia Preprocedure Evaluation (Addendum)
Anesthesia Evaluation  Patient identified by MRN, date of birth, ID band Patient awake    Reviewed: Allergy & Precautions, NPO status , Patient's Chart, lab work & pertinent test results, reviewed documented beta blocker date and time   Airway Mallampati: II  TM Distance: >3 FB Neck ROM: Full    Dental  (+) Dental Advisory Given, Edentulous Upper, Partial Lower, Missing,    Pulmonary neg pulmonary ROS, Current Smoker and Patient abstained from smoking., former smoker,    Pulmonary exam normal breath sounds clear to auscultation       Cardiovascular hypertension, Pt. on medications and Pt. on home beta blockers negative cardio ROS Normal cardiovascular exam Rhythm:Regular Rate:Normal  EKG 02/29/20 NSR, LAD, anteroseptal MI age undetermined  Echo 03/02/20 1. Left ventricular ejection fraction, by estimation, is 65 to 70%. The left ventricle has normal function. The left ventricle has no regional wall motion abnormalities. Left ventricular diastolic parameters are consistent with Grade I diastolic dysfunction (impaired relaxation).  2. Right ventricular systolic function is normal. The right ventricular size is normal. Tricuspid regurgitation signal is inadequate for assessing PA pressure.  3. The mitral valve is normal in structure. No evidence of mitral valve regurgitation. No evidence of mitral stenosis.  4. The aortic valve is normal in structure. Aortic valve regurgitation is not visualized. Mild aortic valve stenosis. Aortic valve area, by VTI measures 2.07 cm. Aortic valve mean gradient measures 6.0 mmHg.  5. The inferior vena cava is normal in size with greater than 50% respiratory variability, suggesting right atrial pressure of 3 mmHg.   Neuro/Psych Seizures -, Well Controlled,  Anxiety negative neurological ROS  negative psych ROS   GI/Hepatic negative GI ROS, Neg liver ROS, PUD, Pancreatic pseudocyst Hx/o idiopathic  necrotizing pancreatitis   Endo/Other  negative endocrine ROSdiabetesHyperlipidemia  Renal/GU Renal InsufficiencyRenal diseasenegative Renal ROS  negative genitourinary   Musculoskeletal negative musculoskeletal ROS (+) Arthritis , Osteoarthritis,    Abdominal   Peds negative pediatric ROS (+)  Hematology negative hematology ROS (+) Blood dyscrasia, anemia ,   Anesthesia Other Findings   Reproductive/Obstetrics negative OB ROS                            Anesthesia Physical  Anesthesia Plan  ASA: 3  Anesthesia Plan: General   Post-op Pain Management: Minimal or no pain anticipated   Induction: Intravenous  PONV Risk Score and Plan: 4 or greater and Treatment may vary due to age or medical condition and Propofol infusion  Airway Management Planned: Oral ETT  Additional Equipment: None  Intra-op Plan:   Post-operative Plan: Extubation in OR  Informed Consent: I have reviewed the patients History and Physical, chart, labs and discussed the procedure including the risks, benefits and alternatives for the proposed anesthesia with the patient or authorized representative who has indicated his/her understanding and acceptance.     Dental advisory given  Plan Discussed with: CRNA and Anesthesiologist  Anesthesia Plan Comments: (  )      Anesthesia Quick Evaluation

## 2021-02-25 ENCOUNTER — Encounter (HOSPITAL_COMMUNITY): Payer: Self-pay | Admitting: Gastroenterology

## 2021-02-25 ENCOUNTER — Other Ambulatory Visit: Payer: Self-pay

## 2021-02-25 ENCOUNTER — Encounter (HOSPITAL_COMMUNITY)
Admission: RE | Disposition: A | Discharge status: Home / Self Care | Payer: Self-pay | Source: Home / Self Care | Attending: Gastroenterology

## 2021-02-25 ENCOUNTER — Ambulatory Visit (HOSPITAL_COMMUNITY)
Admission: RE | Admit: 2021-02-25 | Discharge: 2021-02-25 | Disposition: A | Discharge status: Home / Self Care | Payer: Medicare Other | Attending: Gastroenterology | Admitting: Gastroenterology

## 2021-02-25 ENCOUNTER — Ambulatory Visit (HOSPITAL_COMMUNITY): Payer: Medicare Other | Admitting: Anesthesiology

## 2021-02-25 ENCOUNTER — Ambulatory Visit (HOSPITAL_BASED_OUTPATIENT_CLINIC_OR_DEPARTMENT_OTHER): Payer: Medicare Other | Admitting: Anesthesiology

## 2021-02-25 DIAGNOSIS — Z794 Long term (current) use of insulin: Secondary | ICD-10-CM | POA: Diagnosis not present

## 2021-02-25 DIAGNOSIS — K3189 Other diseases of stomach and duodenum: Secondary | ICD-10-CM | POA: Insufficient documentation

## 2021-02-25 DIAGNOSIS — F419 Anxiety disorder, unspecified: Secondary | ICD-10-CM | POA: Insufficient documentation

## 2021-02-25 DIAGNOSIS — Z800 Family history of malignant neoplasm of digestive organs: Secondary | ICD-10-CM | POA: Insufficient documentation

## 2021-02-25 DIAGNOSIS — E785 Hyperlipidemia, unspecified: Secondary | ICD-10-CM | POA: Diagnosis not present

## 2021-02-25 DIAGNOSIS — Z8711 Personal history of peptic ulcer disease: Secondary | ICD-10-CM | POA: Insufficient documentation

## 2021-02-25 DIAGNOSIS — I10 Essential (primary) hypertension: Secondary | ICD-10-CM | POA: Diagnosis not present

## 2021-02-25 DIAGNOSIS — K862 Cyst of pancreas: Secondary | ICD-10-CM | POA: Insufficient documentation

## 2021-02-25 DIAGNOSIS — K8689 Other specified diseases of pancreas: Secondary | ICD-10-CM

## 2021-02-25 DIAGNOSIS — K222 Esophageal obstruction: Secondary | ICD-10-CM | POA: Insufficient documentation

## 2021-02-25 DIAGNOSIS — K859 Acute pancreatitis without necrosis or infection, unspecified: Secondary | ICD-10-CM | POA: Diagnosis not present

## 2021-02-25 DIAGNOSIS — K449 Diaphragmatic hernia without obstruction or gangrene: Secondary | ICD-10-CM

## 2021-02-25 DIAGNOSIS — E119 Type 2 diabetes mellitus without complications: Secondary | ICD-10-CM | POA: Insufficient documentation

## 2021-02-25 DIAGNOSIS — R109 Unspecified abdominal pain: Secondary | ICD-10-CM | POA: Diagnosis present

## 2021-02-25 DIAGNOSIS — R6881 Early satiety: Secondary | ICD-10-CM | POA: Diagnosis present

## 2021-02-25 DIAGNOSIS — M1990 Unspecified osteoarthritis, unspecified site: Secondary | ICD-10-CM | POA: Diagnosis not present

## 2021-02-25 DIAGNOSIS — K319 Disease of stomach and duodenum, unspecified: Secondary | ICD-10-CM | POA: Diagnosis not present

## 2021-02-25 HISTORY — PX: ESOPHAGOGASTRODUODENOSCOPY (EGD) WITH PROPOFOL: SHX5813

## 2021-02-25 HISTORY — PX: UPPER ESOPHAGEAL ENDOSCOPIC ULTRASOUND (EUS): SHX6562

## 2021-02-25 HISTORY — PX: BIOPSY: SHX5522

## 2021-02-25 LAB — GLUCOSE, CAPILLARY
Glucose-Capillary: 87 mg/dL (ref 70–99)
Glucose-Capillary: 138 mg/dL — ABNORMAL HIGH (ref 70–99)

## 2021-02-25 SURGERY — ESOPHAGOGASTRODUODENOSCOPY (EGD) WITH PROPOFOL
Anesthesia: Monitor Anesthesia Care

## 2021-02-25 MED ORDER — LIDOCAINE 2% (20 MG/ML) 5 ML SYRINGE
INTRAMUSCULAR | Status: DC | PRN
  Administered 2021-02-25: 100 mg via INTRAVENOUS

## 2021-02-25 MED ORDER — SODIUM CHLORIDE 0.9 % IV SOLN: INTRAVENOUS | Status: DC

## 2021-02-25 MED ORDER — PHENYLEPHRINE HCL-NACL 20-0.9 MG/250ML-% IV SOLN
INTRAVENOUS | Status: DC | PRN
  Administered 2021-02-25: 25 ug/min via INTRAVENOUS

## 2021-02-25 MED ORDER — CIPROFLOXACIN IN D5W 400 MG/200ML IV SOLN
INTRAVENOUS | Status: AC
  Filled 2021-02-25: qty 200

## 2021-02-25 MED ORDER — PROPOFOL 10 MG/ML IV BOLUS
INTRAVENOUS | Status: DC | PRN
  Administered 2021-02-25: 30 mg via INTRAVENOUS
  Administered 2021-02-25: 20 mg via INTRAVENOUS
  Administered 2021-02-25: 30 mg via INTRAVENOUS
  Administered 2021-02-25: 100 mg via INTRAVENOUS

## 2021-02-25 MED ORDER — LACTATED RINGERS IV SOLN: INTRAVENOUS | Status: DC | PRN

## 2021-02-25 MED ORDER — FENTANYL CITRATE (PF) 100 MCG/2ML IJ SOLN
INTRAMUSCULAR | Status: AC
  Filled 2021-02-25: qty 2

## 2021-02-25 MED ORDER — SUCCINYLCHOLINE CHLORIDE 200 MG/10ML IV SOSY
PREFILLED_SYRINGE | INTRAVENOUS | Status: DC | PRN
  Administered 2021-02-25: 120 mg via INTRAVENOUS

## 2021-02-25 MED ORDER — ONDANSETRON HCL 4 MG/2ML IJ SOLN
INTRAMUSCULAR | Status: DC | PRN
  Administered 2021-02-25: 4 mg via INTRAVENOUS

## 2021-02-25 MED ORDER — DEXAMETHASONE SODIUM PHOSPHATE 10 MG/ML IJ SOLN
INTRAMUSCULAR | Status: DC | PRN
  Administered 2021-02-25: 4 mg via INTRAVENOUS

## 2021-02-25 SURGICAL SUPPLY — 15 items

## 2021-02-25 NOTE — Anesthesia Procedure Notes (Signed)
Procedure Name: Intubation Date/Time: 02/25/2021 7:43 AM Performed by: Lowella Dell, CRNA Pre-anesthesia Checklist: Patient identified, Emergency Drugs available, Suction available and Patient being monitored Patient Re-evaluated:Patient Re-evaluated prior to induction Oxygen Delivery Method: Circle System Utilized Preoxygenation: Pre-oxygenation with 100% oxygen Induction Type: IV induction Ventilation: Mask ventilation without difficulty Laryngoscope Size: Mac and 3 Grade View: Grade I Tube type: Oral Tube size: 7.0 mm Number of attempts: 1 Airway Equipment and Method: Stylet Placement Confirmation: ETT inserted through vocal cords under direct vision, positive ETCO2 and breath sounds checked- equal and bilateral Secured at: 22 cm Tube secured with: Tape Dental Injury: Teeth and Oropharynx as per pre-operative assessment

## 2021-02-25 NOTE — Anesthesia Postprocedure Evaluation (Signed)
Anesthesia Post Note  Patient: Holly Navarro  Procedure(s) Performed: ESOPHAGOGASTRODUODENOSCOPY (EGD) WITH PROPOFOL UPPER ESOPHAGEAL ENDOSCOPIC ULTRASOUND (EUS) BIOPSY     Anesthesia Type: MAC Anesthetic complications: no   No notable events documented.  Last Vitals:  Vitals:   02/25/21 0856 02/25/21 0907  BP: (!) 145/84 (!) 141/76  Pulse: 72 64  Resp: 12 12  Temp: 36.8 C 36.8 C  SpO2: 96% 96%    Last Pain:  Vitals:   02/25/21 0856  TempSrc:   PainSc: 0-No pain                 Sehar Sedano

## 2021-02-25 NOTE — Op Note (Signed)
[facility] Patient Name: Holly Navarro Procedure Date : 02/25/2021 MRN: [id number] Attending MD: Justice Britain , MD Date of Birth: 1945-05-27 CSN: [id number] Age: 76 Admit Type: Inpatient Procedure:                Upper EUS Indications:              Pancreatic cyst on CT scan, For cyst enterostomy,                            For necrosectomy, For stent placement, Upper                            abdominal pain, Early satiety, Weight loss has                            stabilized over months Providers:                Justice Britain, MD, Jeanella Cara, RN,                            Tyna Jaksch Technician Referring MD:             Jearld Fenton Medicines:                General Anesthesia Complications:            No immediate complications. Estimated Blood Loss:     Estimated blood loss was minimal. Procedure:                Pre-Anesthesia Assessment:                           - Prior to the procedure, a History and Physical                            was performed, and patient medications and                            allergies were reviewed. The patient's tolerance of                            previous anesthesia was also reviewed. The risks                            and benefits of the procedure and the sedation                            options and risks were discussed with the patient.                            All questions were answered, and informed consent                            was obtained. Prior Anticoagulants: The patient has  taken no previous anticoagulant or antiplatelet                            agents. ASA Grade Assessment: III - A patient with                            severe systemic disease. After reviewing the risks                            and benefits, the patient was deemed in                            satisfactory condition to undergo the procedure.                           After  obtaining informed consent, the endoscope was                            passed under direct vision. Throughout the                            procedure, the patient's blood pressure, pulse, and                            oxygen saturations were monitored continuously. The                            [id number]-[id number] ([id number]) Olympus endoscope was                            introduced through the mouth, and advanced to the                            second part of duodenum. The [id number] ([id number])                            [place] was introduced through the                            mouth, and advanced to the area of papilla. The                            [id number] ([id number]) Olympus linear ultrasound scope                            was introduced through the mouth, and advanced to                            the duodenum for ultrasound examination from the                            stomach and duodenum. The upper EUS was  accomplished without difficulty. The patient                            tolerated the procedure. Scope In: Scope Out: Findings:      ENDOSCOPIC FINDING: :      No gross lesions were noted in the entire esophagus.      The Z-line was regular and was found 37 cm from the incisors.      A non-obstructing Schatzki ring was found at the gastroesophageal       junction.      A 3 cm hiatal hernia was present.      Patchy mildly erythematous mucosa without bleeding was found in the       entire examined stomach. Biopsies were taken with a cold forceps for       histology and Helicobacter pylori testing.      No gross lesions were noted in the duodenal bulb, in the first portion       of the duodenum and in the second portion of the duodenum.      The major papilla was normal.      ENDOSONOGRAPHIC FINDING: :      A large anechoic cavity consistent with a pseudocyst was identified in       the entirety of the pancreas (pancreatic head, genu  of the pancreas,       pancreatic body and pancreatic tail). The lesion on CT measures >15 cm.       On today's EUS, one of the largest compartments in the TOP region       measured 36 mm by 56 mm in maximal cross-sectional diameter. There was       findings of thin septation within the cyst. The outer wall of the lesion       was thick. There was no associated mass. There was internal debris       within the fluid-filled cavity consistent with walled off necrosis.       Evaluation to attempt at EUS Cystgastrostomy/Cystenterostomy was       performed. I found that at 47 cm (at the TOP) that there was a possible       window for a DP cystgastrostomy. I was concerned with arterial blood       vessels and tributaries from the splenic hilum that were very close to       this region for an AXIOS LAMS stent placement, though the wall was       adequate in thickness for a 1 cm stent. In the antral region of the       stomach towards the NOP region and confluence region a larger portion of       the cyst was identified, however, I could not get the range of the wall       thickness to be less than 1 cm, though I did not see as much blood       vessel issues present there (I do not have a 15 x 15 mm AXIOS available       at this time). In the HOP region, there are blood vessels that are       within the way, though DP cystentostomy could be considered a       possibility I believe based on the wall length. As noted below I did not       proceed with AXIOS LAMS today.  There was no sign of significant endosonographic abnormality in the       gallbladder. No stones were identified.      Endosonographic imaging in the visualized portion of the liver showed no       mass.      The celiac region was visualized. Impression:               EGD Impression:                           - No gross lesions in esophagus. Z-line regular, 37                            cm from the incisors. Non-obstructing  Schatzki ring.                           - 3 cm hiatal hernia.                           - Erythematous mucosa in the stomach. Biopsied for                            HP.                           - No gross lesions in the duodenal bulb, in the                            first portion of the duodenum and in the second                            portion of the duodenum.                           - Normal major papilla.                           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 As I document above, I think a                            cystgastrostomy/cystenterostomy may be possible,                            but AXIOS stenting may be higher risk with the                            amount of blood vessels in the  way and so DP                            cystgastrostomy may need to be considered. Although                            there could be a possible good area for drainage                            attempt in the antral region straddling the                            NOP/Confluence region a longer 15 mm x 15 mm AXIOS                            may be needed and is not available currently                            Unfortunately, I do not feel that I can drain this                            as safely as I would like.                           - There was no sign of significant pathology in the                            gallbladder. Recommendation:           - The patient will be observed post-procedure,                            until all discharge criteria are met.                           - Discharge patient to home.                           - Patient has a contact number available for                            emergencies. The signs and symptoms of potential                            delayed complications  were discussed with the                            patient. Return to normal activities tomorrow.                            Written discharge instructions were provided to the                            patient.                           -  Low fat diet.                           - Await path results.                           - Observe patient's clinical course.                           - Refer to a [facility] with [facility]                            Endoscopy to repeat attempt at                            Cystgastrostomy/Cystenterostomy creation in the                            setting of improving the patient's early satiety                            and abdominal upper pain. Her weight has stabilized                            for months now, but with drainage, she may see                            further improvement in her weight so, I think there                            are reasons to consider repeat attempt. Appreciate                            the help with this patient.                           - Perform CT scan (computed tomography) of the                            abdomen with contrast at appointment to be                            scheduled to follow up in the coming weeks (prior                            to patient's visit at an academic center).                           - The findings and recommendations were discussed                            with the patient.                           - The findings and recommendations  were discussed                            with the patient's family. Procedure Code(s):        --- Professional ---                           [phone], Esophagogastroduodenoscopy, flexible,                            transoral; with endoscopic ultrasound examination                            limited to the esophagus, stomach or duodenum, and                            adjacent structures                           43239,  Esophagogastroduodenoscopy, flexible,                            transoral; with biopsy, single or multiple Diagnosis Code(s):        --- Professional ---                           K22.2, Esophageal obstruction                           K44.9, Diaphragmatic hernia without obstruction or                            gangrene                           K31.89, Other diseases of stomach and duodenum                           K86.2, Cyst of pancreas CPT copyright 2019 [employer]. All rights reserved. The codes documented in this report are preliminary and upon coder review may  be revised to meet current compliance requirements. Justice Britain, MD 02/25/2021 9:06:50 AM Number of Addenda: 0

## 2021-02-25 NOTE — Transfer of Care (Signed)
Immediate Anesthesia Transfer of Care Note  Patient: Holly Navarro  Procedure(s) Performed: ESOPHAGOGASTRODUODENOSCOPY (EGD) WITH PROPOFOL UPPER ESOPHAGEAL ENDOSCOPIC ULTRASOUND (EUS) BIOPSY  Patient Location: PACU  Anesthesia Type:General  Level of Consciousness: awake, alert  and oriented  Airway & Oxygen Therapy: Patient Spontanous Breathing  Post-op Assessment: Report given to RN, Post -op Vital signs reviewed and stable and Patient moving all extremities X 4  Post vital signs: Reviewed and stable  Last Vitals:  Vitals Value Taken Time  BP 145/84 02/25/21 0856  Temp    Pulse 70 02/25/21 0857  Resp 13 02/25/21 0858  SpO2 96 % 02/25/21 0857  Vitals shown include unvalidated device data.  Last Pain:  Vitals:   02/25/21 0707  TempSrc: Temporal  PainSc: 0-No pain         Complications: No notable events documented.

## 2021-02-25 NOTE — Interval H&P Note (Signed)
History and Physical Interval Note:  02/25/2021 7:29 AM  Holly Navarro A Brazell  has presented today for surgery, with the diagnosis of pancreatic neocrosis pancreatic cyst.  The various methods of treatment have been discussed with the patient and family. After consideration of risks, benefits and other options for treatment, the patient has consented to  Procedure(s): ESOPHAGOGASTRODUODENOSCOPY (EGD) WITH PROPOFOL (N/A) UPPER ESOPHAGEAL ENDOSCOPIC ULTRASOUND (EUS) (N/A) as a surgical intervention.  The patient's history has been reviewed, patient examined, no change in status, stable for surgery.  I have reviewed the patient's chart and labs.  Questions were answered to the patient's satisfaction.    The risks of an EUS including intestinal perforation, bleeding, infection, aspiration, and medication effects were discussed as was the possibility it may not give a definitive diagnosis if a biopsy is performed.  When a biopsy of the pancreas is done as part of the EUS, there is an additional risk of pancreatitis at the rate of about 1-2%.  It was explained that procedure related pancreatitis is typically mild, although it can be severe and even life threatening, which is why we do not perform random pancreatic biopsies and only biopsy a lesion/area we feel is concerning enough to warrant the risk.   [employer]

## 2021-02-26 ENCOUNTER — Encounter (HOSPITAL_COMMUNITY): Payer: Self-pay | Admitting: Gastroenterology

## 2021-02-26 ENCOUNTER — Encounter: Payer: Self-pay | Admitting: Gastroenterology

## 2021-02-26 LAB — SURGICAL PATHOLOGY

## 2021-03-01 ENCOUNTER — Encounter: Payer: Self-pay | Admitting: Internal Medicine

## 2021-03-04 ENCOUNTER — Encounter: Payer: Self-pay | Admitting: Gastroenterology

## 2021-03-05 NOTE — Telephone Encounter (Signed)
Patty, ?Please offer the patient a virtual visit at 11:50 PM next Tuesday or a 3:50 PM visit next Friday to discuss next steps. ?Thanks. ?GM ?

## 2021-03-09 ENCOUNTER — Telehealth (INDEPENDENT_AMBULATORY_CARE_PROVIDER_SITE_OTHER): Payer: Medicare Other | Admitting: Gastroenterology

## 2021-03-09 DIAGNOSIS — K8501 Idiopathic acute pancreatitis with uninfected necrosis: Secondary | ICD-10-CM | POA: Diagnosis not present

## 2021-03-09 DIAGNOSIS — Z1211 Encounter for screening for malignant neoplasm of colon: Secondary | ICD-10-CM

## 2021-03-09 DIAGNOSIS — K862 Cyst of pancreas: Secondary | ICD-10-CM

## 2021-03-09 DIAGNOSIS — I82890 Acute embolism and thrombosis of other specified veins: Secondary | ICD-10-CM

## 2021-03-09 DIAGNOSIS — K863 Pseudocyst of pancreas: Secondary | ICD-10-CM | POA: Diagnosis not present

## 2021-03-09 DIAGNOSIS — R6881 Early satiety: Secondary | ICD-10-CM | POA: Diagnosis not present

## 2021-03-09 NOTE — Progress Notes (Addendum)
[facility] VISIT   Primary Care Provider Jearld Fenton, NP [place] [address] [phone]   Patient Profile: Holly Navarro is a 76 y.o. 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  The patient presents to the [facility] for an evaluation and management of problem(s) noted below:  Problem List 1. Pancreatic cyst   2. Early satiety   3. Pseudocyst/walled off necrosis of pancreas   4. Idiopathic acute pancreatitis with uninfected necrosis   5. Splenic vein thrombosis   6. Colon cancer screening      I connected with  York Ram on 98/33/82. I verified that I was speaking with the correct person using two identifiers. Due to the COVID-19 Pandemic, this service was provided via telemedicine using audio/visual media-caregility. The patient was located at home. The provider was located in the office. The patient did consent to this visit and is aware of charges through their insurance as well as the limitations of evaluation and management by telemedicine. The patient is an established patient with our practice. Other persons participating in this telemedicine service were none. Time spent on visit was 25 minutes.   History of Present Illness Please see prior notes for full details of HPI.  Interval History We are doing today's visit via telemedicine.  I last saw the patient a few weeks ago for an EUS cystgastrostomy attempt.  My EUS report is noted below.  I do not feel comfortable with the available windows for typical AXIOS stenting.  I offered the patient an opportunity to be  evaluated at one of the quaternary centers.  She is maintaining her weight.  She still has significant early satiety.  Abdominal discomfort and pressure occurs when she bends forward.  She has significant abdominal varices that are noted on the EUS report.  She is not having any fevers or chills.  She is not having significant abdominal pain otherwise.  She has noted that her blood sugars have been a little bit more difficult to manage over the course the last week.  GI Review of Systems Positive as above including bloating at times Negative for odynophagia, dysphagia, change in bowel habits, melena, hematochezia   Review of Systems General: Denies fevers/chills/unintentional weight loss (she is pushing herself to eat) Cardiovascular: Denies chest pain/palpitations Pulmonary: Denies shortness of breath Gastroenterological: See HPI Genitourinary: Denies darkened urine Hematological: Denies easy bruising/bleeding Dermatological: Denies jaundice Psychological: Mood is stable   Medications Current Outpatient Medications  Medication Sig Dispense Refill   atenolol (TENORMIN) 50 MG tablet Take 1 tablet (50 mg total) by mouth daily. 90 tablet 1   blood glucose meter kit and supplies KIT Dispense based on patient and insurance preference. Use up to four times daily as directed. 1 each 0   cimetidine (TAGAMET) 200 MG tablet Take 200 mg by mouth 2 (two) times daily as needed (indigestion/heartburn.).     Coenzyme Q10 (COQ10) 100 MG CAPS Take 100 mg by mouth in the morning.     ECHINACEA PO Take 900 mg by mouth in the morning.     fluticasone (FLONASE) 50 MCG/ACT nasal spray Place 2 sprays into both nostrils daily as needed. 16 g 1   Insulin Pen Needle (NOVOFINE PEN NEEDLE)  32G X 6 MM MISC Use to inject insulin daily as directed. 90 each 1   lipase/protease/amylase (CREON) 36000 UNITS CPEP capsule Take 2 capsules (72,000 Units total) by mouth 3 (three) times daily with meals. May also take 1 capsule  (36,000 Units total) as needed (with snacks). 720 capsule 2   lisinopril (ZESTRIL) 10 MG tablet Take 1 tablet (10 mg total) by mouth daily. 90 tablet 1   Multiple Vitamins-Minerals (VISTA ADVANCED AREDS2 FORMULA) CAPS Take 2 tablets by mouth daily with lunch. [place] Formula     pantoprazole (PROTONIX) 40 MG tablet TAKE 1 TABLET BY MOUTH AT BEDTIME 90 tablet 1   Polyethyl Glycol-Propyl Glycol (LUBRICANT EYE DROPS) 0.4-0.3 % SOLN Place 1 drop into both eyes in the morning and at bedtime.     Red Yeast Rice 600 MG CAPS Take 600 mg by mouth 2 (two) times daily.     simethicone (MYLICON) 294 MG chewable tablet Chew 125 mg by mouth in the morning, at noon, in the evening, and at bedtime.     TRESIBA FLEXTOUCH 100 UNIT/ML FlexTouch Pen Inject 7 Units into the skin daily. Adjust dose as advised. (Patient taking differently: Inject 9 Units into the skin daily. Adjust dose as advised.) 3 mL 2   No current facility-administered medications for this visit.    Allergies Allergies  Allergen Reactions   Fentanyl Other (See Comments)    Patient is unsure of reaction type    Morphine And Related Other (See Comments)    Patient is unsure of reaction type      Histories Past Medical History:  Diagnosis Date   Anemia    Anxiety    Arthritis    Cardiac arrhythmia    Diabetes mellitus without complication ([place])    Hyperlipidemia    Hypertension    Pancreatic pseudocyst    Seizure ([place])    Stomach ulcer    Past Surgical History:  Procedure Laterality Date   BIOPSY  05/20/2020   Procedure: BIOPSY;  Surgeon: Irving Copas., MD;  Location: [facility];  Service: Gastroenterology;;   BIOPSY  02/25/2021   Procedure: BIOPSY;  Surgeon: Irving Copas., MD;  Location: [facility];  Service: Gastroenterology;;   ESOPHAGOGASTRODUODENOSCOPY N/A 05/20/2020   Procedure: ESOPHAGOGASTRODUODENOSCOPY (EGD);  Surgeon: Irving Copas., MD;  Location: [facility];   Service: Gastroenterology;  Laterality: N/A;   ESOPHAGOGASTRODUODENOSCOPY (EGD) WITH PROPOFOL N/A 02/25/2021   Procedure: ESOPHAGOGASTRODUODENOSCOPY (EGD) WITH PROPOFOL;  Surgeon: Rush Landmark Telford Nab., MD;  Location: [facility];  Service: Gastroenterology;  Laterality: N/A;   LAPAROSCOPIC OVARIAN CYSTECTOMY     TONSILLECTOMY     UPPER ESOPHAGEAL ENDOSCOPIC ULTRASOUND (EUS) N/A 05/20/2020   Procedure: UPPER ESOPHAGEAL ENDOSCOPIC ULTRASOUND (EUS)  ;  Surgeon: Irving Copas., MD;  Location: [facility];  Service: Gastroenterology;  Laterality: N/A;   UPPER ESOPHAGEAL ENDOSCOPIC ULTRASOUND (EUS) N/A 02/25/2021   Procedure: UPPER ESOPHAGEAL ENDOSCOPIC ULTRASOUND (EUS);  Surgeon: Irving Copas., MD;  Location: [facility];  Service: Gastroenterology;  Laterality: N/A;   Social History   Socioeconomic History   Marital status: Widowed    Spouse name: Not on file   Number of children: 4   Years of education: Not on file   Highest education level: Not on file  Occupational History   Occupation: retired  Tobacco Use   Smoking status: Every Day    Packs/day: 0.25    Types: Cigarettes   Smokeless tobacco: Never  Vaping Use  Vaping Use: Never used  Substance and Sexual Activity   Alcohol use: Not Currently    Alcohol/week: 0.0 standard drinks    Comment: occasionally    Drug use: No   Sexual activity: Not on file  Other Topics Concern   Not on file  Social History Narrative   Not on file   Social Determinants of Health   Financial Resource Strain: Not on file  Food Insecurity: Not on file  Transportation Needs: Not on file  Physical Activity: Not on file  Stress: Not on file  Social Connections: Not on file  Intimate Partner Violence: Not on file   Family History  Problem Relation Age of Onset   Heart disease Mother    Kidney disease Mother    Diabetes Father    Pancreatic cancer Brother        Agent Orange   Gallstones Daughter    Colon cancer Son 50    Diabetes Paternal Uncle        x 2   Esophageal cancer Neg Hx    Inflammatory bowel disease Neg Hx    Liver disease Neg Hx    Stomach cancer Neg Hx    I have reviewed her medical, social, and family history in detail and updated the electronic medical record as necessary.    PHYSICAL EXAMINATION  Telemedicine visit  REVIEW OF DATA  I reviewed the following data at the time of this encounter:  GI Procedures and Studies  February 2023 EGD/EUS EGD Impression: - No gross lesions in esophagus. Z-line regular, 37 cm from the incisors. Non-obstructing Schatzki ring. - 3 cm hiatal hernia. - Erythematous mucosa in the stomach. Biopsied for HP. - No gross lesions in the duodenal bulb, in the first portion of the duodenum and in the second portion of the duodenum. - Normal major papilla. EUS Impression: - A pseudocyst with evidence of walled off sterile necrosis is present along the entirety of the pancreas. As I document above, I think a cystgastrostomy/cystenterostomy may be possible, but AXIOS stenting may be higher risk with the amount of blood vessels in the way and so DP cystgastrostomy may need to be considered. Although there could be a possible good area for drainage attempt in the antral region straddling the NOP/[place] region a longer 15 mm x 15 mm AXIOS may be needed and is not available currently Unfortunately, I do not feel that I can drain this as safely as I would like. - There was no sign of significant pathology in the gallbladder.  Laboratory Studies  Reviewed those in epic and care everywhere  Imaging Studies  Previously reviewed   ASSESSMENT  Ms. Awadallah is a 76 y.o. 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  The patient is seen today for evaluation and management of:  1. Pancreatic cyst   2. Early satiety   3. Pseudocyst/walled off necrosis of pancreas   4. Idiopathic acute pancreatitis with uninfected necrosis   5. Splenic vein thrombosis   6. Colon cancer screening    The patient is hemodynamically stable.  Clinically she does continue to have symptoms of early satiety though she is able to maintain her weight still.  The abdominal discomfort and pressure as well or reasons that I wanted to  attempt an EUS cystgastrostomy.  Based on the access and availability of appropriate windows, I would feel more comfortable for this patient to have another attempt at possible cystgastrostomy at one of the quaternary centers where double-pigtail stenting may be required with cystgastrostomy creation rather than typical AXIOS stenting.  The varices within the abdominal cavity do make potential attempted drainage more difficult.  Time will tell.  She would like to be evaluated at [facility] and we will move forward with placing referral.  I hope they will be able to help her if not we will continue to monitor and manage her as able.  Etiology of her symptoms remains unclear of her initial pancreatitis though hard for [place] to remove her gallbladder at this point without sludge or microlithiasis or choledocholithiasis being found.  All patient questions were answered to the best of my ability, and the patient agrees to the aforementioned plan of action with follow-up as indicated.   PLAN  Continue Creon 72,000 units with each meal and 36,000 units with each snack Continue LFT Continue smaller meals more frequently during the day Referral to [facility] for attempted EUS cystgastrostomy We will plan a CT abdomen/pelvis at least 1 to 2 weeks before [facility] clinic visit Continue to try to stop all tobacco use She  remains unsure about colon cancer screening right now while dealing with her pancreas but hopefully we can further discuss this during the course of this year (increased risk individual due to her son having had colon cancer)   No orders of the defined types were placed in this encounter.   New Prescriptions   No medications on file   Modified Medications   No medications on file    Planned Follow Up No follow-ups on file.   Total Time in Face-to-Face and in Coordination of Care for patient including independent/personal interpretation/review of prior testing, medical history, examination, medication adjustment, review of imaging, communicating results with the patient directly, and documentation with the EHR is 25 minutes.   Justice Britain, MD [facility] Office # [phone]

## 2021-03-14 ENCOUNTER — Encounter: Payer: Self-pay | Admitting: Gastroenterology

## 2021-03-19 ENCOUNTER — Encounter: Payer: Self-pay | Admitting: Gastroenterology

## 2021-03-22 ENCOUNTER — Other Ambulatory Visit: Payer: Self-pay

## 2021-03-22 DIAGNOSIS — K862 Cyst of pancreas: Secondary | ICD-10-CM

## 2021-03-22 NOTE — Telephone Encounter (Signed)
Holly Navarro, ?Please let the patient know that now that we know when her date of consultation is, we will proceed with a CT-Abdomen to be done at least 1 week prior.  This will be with IV contrast/PO contrast.  If her GFR is too low, then we will do a Pancreatic Protocolled CT-Abdomen. ?Thanks ?GM ?

## 2021-04-12 ENCOUNTER — Other Ambulatory Visit: Payer: Self-pay

## 2021-04-12 DIAGNOSIS — K862 Cyst of pancreas: Secondary | ICD-10-CM

## 2021-04-13 ENCOUNTER — Ambulatory Visit
Admission: RE | Admit: 2021-04-13 | Discharge: 2021-04-13 | Disposition: A | Discharge status: Home / Self Care | Payer: Medicare Other | Source: Ambulatory Visit | Attending: Gastroenterology | Admitting: Gastroenterology

## 2021-04-13 DIAGNOSIS — K862 Cyst of pancreas: Secondary | ICD-10-CM | POA: Diagnosis not present

## 2021-04-13 DIAGNOSIS — K8681 Exocrine pancreatic insufficiency: Secondary | ICD-10-CM | POA: Insufficient documentation

## 2021-04-13 DIAGNOSIS — K8689 Other specified diseases of pancreas: Secondary | ICD-10-CM | POA: Insufficient documentation

## 2021-04-13 DIAGNOSIS — R935 Abnormal findings on diagnostic imaging of other abdominal regions, including retroperitoneum: Secondary | ICD-10-CM | POA: Insufficient documentation

## 2021-04-13 LAB — POCT I-STAT CREATININE: Creatinine, Ser: 1.6 mg/dL — ABNORMAL HIGH (ref 0.44–1.00)

## 2021-04-13 MED ORDER — IOHEXOL 300 MG/ML  SOLN
100.0000 mL | Freq: Once | INTRAMUSCULAR | Status: AC | PRN
  Administered 2021-04-13: 100 mL via INTRAVENOUS

## 2021-04-19 DIAGNOSIS — K861 Other chronic pancreatitis: Secondary | ICD-10-CM | POA: Diagnosis not present

## 2021-04-19 DIAGNOSIS — K863 Pseudocyst of pancreas: Secondary | ICD-10-CM | POA: Diagnosis not present

## 2021-04-20 ENCOUNTER — Encounter: Payer: Self-pay | Admitting: Gastroenterology

## 2021-04-20 NOTE — Telephone Encounter (Signed)
They should have had everything, but in anycase, once we know who the provider that will be doing the repeat EUS is, I can forward them the pictures of the endoscopic ultrasound.  Just find out whom that is or she can let you know whom that was scheduled with. ?Thanks. ?GM ?

## 2021-04-22 ENCOUNTER — Encounter: Payer: Self-pay | Admitting: Internal Medicine

## 2021-04-22 ENCOUNTER — Ambulatory Visit (INDEPENDENT_AMBULATORY_CARE_PROVIDER_SITE_OTHER): Payer: Medicare Other | Admitting: Internal Medicine

## 2021-04-22 DIAGNOSIS — I10 Essential (primary) hypertension: Secondary | ICD-10-CM | POA: Diagnosis not present

## 2021-04-22 DIAGNOSIS — I714 Abdominal aortic aneurysm, without rupture, unspecified: Secondary | ICD-10-CM

## 2021-04-22 DIAGNOSIS — E119 Type 2 diabetes mellitus without complications: Secondary | ICD-10-CM | POA: Diagnosis not present

## 2021-04-22 DIAGNOSIS — N1831 Chronic kidney disease, stage 3a: Secondary | ICD-10-CM | POA: Diagnosis not present

## 2021-04-22 DIAGNOSIS — I700 Atherosclerosis of aorta: Secondary | ICD-10-CM

## 2021-04-22 DIAGNOSIS — K861 Other chronic pancreatitis: Secondary | ICD-10-CM

## 2021-04-22 DIAGNOSIS — I129 Hypertensive chronic kidney disease with stage 1 through stage 4 chronic kidney disease, or unspecified chronic kidney disease: Secondary | ICD-10-CM

## 2021-04-22 DIAGNOSIS — Z87898 Personal history of other specified conditions: Secondary | ICD-10-CM | POA: Diagnosis not present

## 2021-04-22 DIAGNOSIS — E782 Mixed hyperlipidemia: Secondary | ICD-10-CM

## 2021-04-22 DIAGNOSIS — Z794 Long term (current) use of insulin: Secondary | ICD-10-CM | POA: Diagnosis not present

## 2021-04-22 DIAGNOSIS — K862 Cyst of pancreas: Secondary | ICD-10-CM

## 2021-04-22 MED ORDER — LISINOPRIL 20 MG PO TABS: 20.0000 mg | ORAL_TABLET | Freq: Every day | ORAL | 0 refills | Status: DC

## 2021-04-22 NOTE — Patient Instructions (Signed)
Hypertension, Adult ?Hypertension is another name for high blood pressure. High blood pressure forces your heart to work harder to pump blood. This can cause problems over time. ?There are two numbers in a blood pressure reading. There is a top number (systolic) over a bottom number (diastolic). It is best to have a blood pressure that is below 120/80. ?What are the causes? ?The cause of this condition is not known. Some other conditions can lead to high blood pressure. ?What increases the risk? ?Some lifestyle factors can make you more likely to develop high blood pressure: ?Smoking. ?Not getting enough exercise or physical activity. ?Being overweight. ?Having too much fat, sugar, calories, or salt (sodium) in your diet. ?Drinking too much alcohol. ?Other risk factors include: ?Having any of these conditions: ?Heart disease. ?Diabetes. ?High cholesterol. ?Kidney disease. ?Obstructive sleep apnea. ?Having a family history of high blood pressure and high cholesterol. ?Age. The risk increases with age. ?Stress. ?What are the signs or symptoms? ?High blood pressure may not cause symptoms. Very high blood pressure (hypertensive crisis) may cause: ?Headache. ?Fast or uneven heartbeats (palpitations). ?Shortness of breath. ?Nosebleed. ?Vomiting or feeling like you may vomit (nauseous). ?Changes in how you see. ?Very bad chest pain. ?Feeling dizzy. ?Seizures. ?How is this treated? ?This condition is treated by making healthy lifestyle changes, such as: ?Eating healthy foods. ?Exercising more. ?Drinking less alcohol. ?Your doctor may prescribe medicine if lifestyle changes do not help enough and if: ?Your top number is above 130. ?Your bottom number is above 80. ?Your personal target blood pressure may vary. ?Follow these instructions at home: ?Eating and drinking ? ?If told, follow the DASH eating plan. To follow this plan: ?Fill one half of your plate at each meal with fruits and vegetables. ?Fill one fourth of your plate  at each meal with whole grains. Whole grains include whole-wheat pasta, brown rice, and whole-grain bread. ?Eat or drink low-fat dairy products, such as skim milk or low-fat yogurt. ?Fill one fourth of your plate at each meal with low-fat (lean) proteins. Low-fat proteins include fish, chicken without skin, eggs, beans, and tofu. ?Avoid fatty meat, cured and processed meat, or chicken with skin. ?Avoid pre-made or processed food. ?Limit the amount of salt in your diet to less than 1,500 mg each day. ?Do not drink alcohol if: ?Your doctor tells you not to drink. ?You are pregnant, may be pregnant, or are planning to become pregnant. ?If you drink alcohol: ?Limit how much you have to: ?0-1 drink a day for women. ?0-2 drinks a day for men. ?Know how much alcohol is in your drink. In the [place] one drink equals one 12 oz bottle of beer (355 mL), one 5 oz glass of wine (148 mL), or one 1? oz glass of hard liquor (44 mL). ?Lifestyle ? ?Work with your doctor to stay at a healthy weight or to lose weight. Ask your doctor what the best weight is for you. ?Get at least 30 minutes of exercise that causes your heart to beat faster (aerobic exercise) most days of the week. This may include walking, swimming, or biking. ?Get at least 30 minutes of exercise that strengthens your muscles (resistance exercise) at least 3 days a week. This may include lifting weights or doing Pilates. ?Do not smoke or use any products that contain nicotine or tobacco. If you need help quitting, ask your doctor. ?Check your blood pressure at home as told by your doctor. ?Keep all follow-up visits. ?Medicines ?Take over-the-counter and prescription medicines   only as told by your doctor. Follow directions carefully. ?Do not skip doses of blood pressure medicine. The medicine does not work as well if you skip doses. Skipping doses also puts you at risk for problems. ?Ask your doctor about side effects or reactions to medicines that you should watch  for. ?Contact a doctor if: ?You think you are having a reaction to the medicine you are taking. ?You have headaches that keep coming back. ?You feel dizzy. ?You have swelling in your ankles. ?You have trouble with your vision. ?Get help right away if: ?You get a very bad headache. ?You start to feel mixed up (confused). ?You feel weak or numb. ?You feel faint. ?You have very bad pain in your: ?Chest. ?Belly (abdomen). ?You vomit more than once. ?You have trouble breathing. ?These symptoms may be an emergency. Get help right away. Call 911. ?Do not wait to see if the symptoms will go away. ?Do not drive yourself to the hospital. ?Summary ?Hypertension is another name for high blood pressure. ?High blood pressure forces your heart to work harder to pump blood. ?For most people, a normal blood pressure is less than 120/80. ?Making healthy choices can help lower blood pressure. If your blood pressure does not get lower with healthy choices, you may need to take medicine. ?This information is not intended to replace advice given to you by your health care provider. Make sure you discuss any questions you have with your health care provider. ?Document Revised: 10/08/2020 Document Reviewed: 10/08/2020 ?Elsevier Patient Education ? 2023 [employer] ? ?

## 2021-04-22 NOTE — Progress Notes (Addendum)
? ?Subjective:  ? ? Patient ID: Holly Navarro, female    DOB: 18-Feb-1945, 76 y.o.   MRN: [id number] ? ?HPI ? ?Pt presents to the clinic today for 3 month follow up of chronic conditions. ? ?AAA without Rupture: CT abd/pelvis from 04/2021. She is taking Atenolol as prescribed. She follow with GI ? ?Chronic Pancreatitis/Pancreatic Cyst: She is taking Pantoprazole, Tagament and Creon as prescribed. She follows with GI. ? ?CKD: Her last creatinine was 1.24, triglycerides 45, 12/2020. She is on Lisinopril for renal protection. She does not follow with nephrology. ? ?HTN: Her BP today is 176/98.She is taking Lisinopril and Atenolol as prescribed. ECG from 06/2020 reviewed. ? ?HLD with Aortic Atherosclerosis: Her last LDL was 132, triglycerides 244, 12/2020. She is taking Red Yeats Rice OTC. She is not taking ASA due to PUD. She tries to consume a low fat diet. ? ?Hx of Seizure: Remote, medication induced. She is not taking any medications for seizures at this time. She does not follow with neurology. ? ?DM 2: Her last A1C was 7.3%, 12/2020. She is taking [place] as prescribed. Her sugars range 127-240. She checks her feet routinely. Her last eye exam was 06/2020. Flu 12/2020. Pneumovax never. Prevnar never. Covid Pfizer x 3. ? ?Review of Systems ? ?Past Medical History:  ?Diagnosis Date  ? Anemia   ? Anxiety   ? Arthritis   ? Cardiac arrhythmia   ? Diabetes mellitus without complication ([place])   ? Hyperlipidemia   ? Hypertension   ? Pancreatic pseudocyst   ? Seizure ([place])   ? Stomach ulcer   ? ? ?Current Outpatient Medications  ?Medication Sig Dispense Refill  ? atenolol (TENORMIN) 50 MG tablet Take 1 tablet (50 mg total) by mouth daily. 90 tablet 1  ? blood glucose meter kit and supplies KIT Dispense based on patient and insurance preference. Use up to four times daily as directed. 1 each 0  ? cimetidine (TAGAMET) 200 MG tablet Take 200 mg by mouth 2 (two) times daily as needed (indigestion/heartburn.).    ? Coenzyme Q10  (COQ10) 100 MG CAPS Take 100 mg by mouth in the morning.    ? ECHINACEA PO Take 900 mg by mouth in the morning.    ? fluticasone (FLONASE) 50 MCG/ACT nasal spray Place 2 sprays into both nostrils daily as needed. 16 g 1  ? Insulin Pen Needle (NOVOFINE PEN NEEDLE) 32G X 6 MM MISC Use to inject insulin daily as directed. 90 each 1  ? lipase/protease/amylase (CREON) 36000 UNITS CPEP capsule Take 2 capsules (72,000 Units total) by mouth 3 (three) times daily with meals. May also take 1 capsule (36,000 Units total) as needed (with snacks). 720 capsule 2  ? lisinopril (ZESTRIL) 10 MG tablet Take 1 tablet (10 mg total) by mouth daily. 90 tablet 1  ? Multiple Vitamins-Minerals (VISTA ADVANCED AREDS2 FORMULA) CAPS Take 2 tablets by mouth daily with lunch. [place]oper Formula    ? pantoprazole (PROTONIX) 40 MG tablet TAKE 1 TABLET BY MOUTH AT BEDTIME 90 tablet 1  ? Polyethyl Glycol-Propyl Glycol (LUBRICANT EYE DROPS) 0.4-0.3 % SOLN Place 1 drop into both eyes in the morning and at bedtime.    ? Red Yeast Rice 600 MG CAPS Take 600 mg by mouth 2 (two) times daily.    ? simethicone (MYLICON) 539 MG chewable tablet Chew 125 mg by mouth in the morning, at noon, in the evening, and at bedtime.    ? TRESIBA FLEXTOUCH  100 UNIT/ML FlexTouch Pen Inject 7 Units into the skin daily. Adjust dose as advised. (Patient taking differently: Inject 9 Units into the skin daily. Adjust dose as advised.) 3 mL 2  ? ?No current facility-administered medications for this visit.  ? ? ?Allergies  ?Allergen Reactions  ? Fentanyl Other (See Comments)  ?  Patient is unsure of reaction type   ? Morphine And Related Other (See Comments)  ?  Patient is unsure of reaction type  ?  ? ? ?Family History  ?Problem Relation Age of Onset  ? Heart disease Mother   ? Kidney disease Mother   ? Diabetes Father   ? Pancreatic cancer Brother   ?     Agent Orange  ? Gallstones Daughter   ? Colon cancer Son 71  ? Diabetes Paternal Uncle   ?     x 2  ?  Esophageal cancer Neg Hx   ? Inflammatory bowel disease Neg Hx   ? Liver disease Neg Hx   ? Stomach cancer Neg Hx   ? ? ?Social History  ? ?Socioeconomic History  ? Marital status: Widowed  ?  Spouse name: Not on file  ? Number of children: 4  ? Years of education: Not on file  ? Highest education level: Not on file  ?Occupational History  ? Occupation: retired  ?Tobacco Use  ? Smoking status: Every Day  ?  Packs/day: 0.25  ?  Types: Cigarettes  ? Smokeless tobacco: Never  ?Vaping Use  ? Vaping Use: Never used  ?Substance and Sexual Activity  ? Alcohol use: Not Currently  ?  Alcohol/week: 0.0 standard drinks  ?  Comment: occasionally   ? Drug use: No  ? Sexual activity: Not on file  ?Other Topics Concern  ? Not on file  ?Social History Narrative  ? Not on file  ? ?Social Determinants of Health  ? ?Financial Resource Strain: Not on file  ?Food Insecurity: Not on file  ?Transportation Needs: Not on file  ?Physical Activity: Not on file  ?Stress: Not on file  ?Social Connections: Not on file  ?Intimate Partner Violence: Not on file  ? ? ? ?Constitutional: Denies fever, malaise, fatigue, headache or abrupt weight changes.  ?HEENT: Denies eye pain, eye redness, ear pain, ringing in the ears, wax buildup, runny nose, nasal congestion, bloody nose, or sore throat. ?Respiratory: Denies difficulty breathing, shortness of breath, cough or sputum production.   ?Cardiovascular: Denies chest pain, chest tightness, palpitations or swelling in the hands or feet.  ?Gastrointestinal: Pt reports intermittent abdominal pain. Denies bloating, constipation, diarrhea or blood in the stool.  ?GU: Denies urgency, frequency, pain with urination, burning sensation, blood in urine, odor or discharge. ?Musculoskeletal: Denies decrease in range of motion, difficulty with gait, muscle pain or joint pain and swelling.  ?Skin: Denies redness, rashes, lesions or ulcercations.  ?Neurological: Denies dizziness, difficulty with memory, difficulty with  speech or problems with balance and coordination.  ?Psych: Denies anxiety, depression, SI/HI. ? ?No other specific complaints in a complete review of systems (except as listed in HPI above). ? ?   ?Objective:  ? Physical Exam ? ?BP (!) 176/98 (BP Location: Left Arm, Patient Position: Sitting, Cuff Size: Normal)   Pulse 63   Temp (!) 97.1 ?F (36.2 ?C) (Temporal)   Wt 123 lb (55.8 kg)   SpO2 98%   BMI 20.47 kg/m?  ? ?Wt Readings from Last 3 Encounters:  ?02/25/21 127 lb (57.6 kg)  ?02/09/21 127 lb  2 oz (57.7 kg)  ?12/30/20 127 lb (57.6 kg)  ? ? ?General: Appears her stated age, well developed, well nourished in NAD. ?Skin: Warm, dry and intact. No ulcerations noted. ?HEENT: Head: normal shape and size; Eyes: sclera white, no icterus, conjunctiva pink, PERRLA and EOMs intact;  ?Cardiovascular: Normal rate and rhythm. S1,S2 noted.  No murmur, rubs or gallops noted. No JVD or BLE edema. No carotid bruits noted. ?Pulmonary/Chest: Normal effort and positive vesicular breath sounds. No respiratory distress. No wheezes, rales or ronchi noted.  ?Abdomen: Soft and nontender. Normal bowel sounds.  ?Musculoskeletal:  No difficulty with gait.  ?Neurological: Alert and oriented.  ?Psychiatric: Mood and affect normal. Behavior is normal. Judgment and thought content normal.  ? ? ? ?BMET ?   ?Component Value Date/Time  ? NA 137 12/11/2020 1429  ? NA 137 03/30/2020 1406  ? K 4.3 12/11/2020 1429  ? CL 104 12/11/2020 1429  ? CO2 25 12/11/2020 1429  ? GLUCOSE 300 (H) 12/11/2020 1429  ? BUN 13 12/11/2020 1429  ? BUN 12 03/30/2020 1406  ? CREATININE 1.60 (H) 04/13/2021 0956  ? CREATININE 1.24 (H) 12/11/2020 1429  ? CALCIUM 9.1 12/11/2020 1429  ? GFRNONAA 54 (L) 06/19/2020 0444  ? GFRNONAA 49 (L) 12/11/2019 1142  ? GFRAA 57 (L) 12/11/2019 1142  ? ? ?Lipid Panel  ?   ?Component Value Date/Time  ? CHOL 200 (H) 12/11/2020 1429  ? CHOL 262 (H) 10/22/2014 1304  ? TRIG 244 (H) 12/11/2020 1429  ? HDL 29 (L) 12/11/2020 1429  ? HDL 43  10/22/2014 1304  ? CHOLHDL 6.9 (H) 12/11/2020 1429  ? LDLCALC 132 (H) 12/11/2020 1429  ? ? ?CBC ?   ?Component Value Date/Time  ? WBC 7.4 12/11/2020 1429  ? RBC 4.51 12/11/2020 1429  ? HGB 13.5 12/11/2020 1429  ? HGB

## 2021-04-22 NOTE — Assessment & Plan Note (Signed)
Uncontrolled ?Increase Lisinopril to 20 mg daily ?Reinforced DASH diet ? ?RTC in 2 weeks for BP check ?

## 2021-04-22 NOTE — Assessment & Plan Note (Signed)
C-Met and lipid profile today ?Encouraged her to consume a low-fat diet ?She declines statin therapy ?Continue Red Yeast Rice for now ?

## 2021-04-22 NOTE — Assessment & Plan Note (Signed)
C-Met today ?On Lisinopril for renal protection ?

## 2021-04-22 NOTE — Assessment & Plan Note (Signed)
No recent seizures

## 2021-04-22 NOTE — Assessment & Plan Note (Signed)
Monitoring CTabdomen/pelvis yearly ?

## 2021-04-22 NOTE — Assessment & Plan Note (Signed)
C-Met and lipid profile today ?Encouraged her to consume a low-fat diet ?She declines statin medications or Aspirin ?Continue Red Yeast Rice for now ?

## 2021-04-22 NOTE — Assessment & Plan Note (Signed)
A1c and urine microalbumin today ?Encouraged her to consume a low-carb diet ?Continue Evaristo Bury ?We will request copy of eye exam ?Encourage routine foot exam ?Encouraged her to get a flu shot in the fall ?Encouraged her to get her COVID booster ?She declines pneumonia vaccines ?

## 2021-04-23 LAB — CBC
HCT: 44.8 % (ref 35.0–45.0)
Hemoglobin: 14.7 g/dL (ref 11.7–15.5)
MCH: 28.9 pg (ref 27.0–33.0)
MCHC: 32.8 g/dL (ref 32.0–36.0)
MCV: 88.2 fL (ref 80.0–100.0)
MPV: 11.2 fL (ref 7.5–12.5)
Platelets: 255 10*3/uL (ref 140–400)
RBC: 5.08 10*6/uL (ref 3.80–5.10)
RDW: 13.1 % (ref 11.0–15.0)
WBC: 8 10*3/uL (ref 3.8–10.8)

## 2021-04-23 LAB — COMPLETE METABOLIC PANEL WITH GFR
AG Ratio: 1.4 (calc) (ref 1.0–2.5)
ALT: 11 U/L (ref 6–29)
AST: 12 U/L (ref 10–35)
Albumin: 4 g/dL (ref 3.6–5.1)
Alkaline phosphatase (APISO): 73 U/L (ref 37–153)
BUN/Creatinine Ratio: 11 (calc) (ref 6–22)
BUN: 15 mg/dL (ref 7–25)
CO2: 27 mmol/L (ref 20–32)
Calcium: 9.6 mg/dL (ref 8.6–10.4)
Chloride: 97 mmol/L — ABNORMAL LOW (ref 98–110)
Creat: 1.34 mg/dL — ABNORMAL HIGH (ref 0.60–1.00)
Globulin: 2.9 g/dL (calc) (ref 1.9–3.7)
Glucose, Bld: 362 mg/dL — ABNORMAL HIGH (ref 65–139)
Potassium: 4.4 mmol/L (ref 3.5–5.3)
Sodium: 133 mmol/L — ABNORMAL LOW (ref 135–146)
Total Bilirubin: 0.5 mg/dL (ref 0.2–1.2)
Total Protein: 6.9 g/dL (ref 6.1–8.1)
eGFR: 41 mL/min/{1.73_m2} — ABNORMAL LOW (ref >=60)

## 2021-04-23 LAB — MICROALBUMIN / CREATININE URINE RATIO
Creatinine, Urine: 19 mg/dL — ABNORMAL LOW (ref 20–275)
Microalb Creat Ratio: 42 mcg/mg creat — ABNORMAL HIGH (ref <30)
Microalb, Ur: 0.8 mg/dL

## 2021-04-23 LAB — LIPID PANEL
Cholesterol: 238 mg/dL — ABNORMAL HIGH (ref <200)
HDL: 38 mg/dL — ABNORMAL LOW (ref >=50)
LDL Cholesterol (Calc): 154 mg/dL (calc) — ABNORMAL HIGH
Non-HDL Cholesterol (Calc): 200 mg/dL (calc) — ABNORMAL HIGH (ref <130)
Total CHOL/HDL Ratio: 6.3 (calc) — ABNORMAL HIGH (ref <5.0)
Triglycerides: 278 mg/dL — ABNORMAL HIGH (ref <150)

## 2021-04-23 LAB — HEMOGLOBIN A1C
Hgb A1c MFr Bld: 10.2 % of total Hgb — ABNORMAL HIGH (ref <5.7)
Mean Plasma Glucose: 246 mg/dL
eAG (mmol/L): 13.6 mmol/L

## 2021-04-23 NOTE — Assessment & Plan Note (Signed)
Ongoing abdominal issues ?Continue Pantoprazole, Tagamet and Creon ?She is having some difficulty with financial responsibility for Creon, advised her to reach out to GI about this. ?Now following with [facility] ?

## 2021-04-23 NOTE — Assessment & Plan Note (Signed)
Ongoing abdominal issues ?Continue Pantoprazole, Tagamet and Creon ?She is having some difficulty with financial responsibility for Creon, advised her to reach out to GI about this. ?

## 2021-05-06 ENCOUNTER — Ambulatory Visit: Payer: Medicare Other | Admitting: Internal Medicine

## 2021-05-06 DIAGNOSIS — I10 Essential (primary) hypertension: Secondary | ICD-10-CM

## 2021-05-06 NOTE — Progress Notes (Signed)
Hypertension, follow-up ? ?BP Readings from Last 3 Encounters:  ?04/22/21 (!) 176/98  ?02/25/21 (!) 141/76  ?02/09/21 (!) 170/90  ? Wt Readings from Last 3 Encounters:  ?04/22/21 123 lb (55.8 kg)  ?02/25/21 127 lb (57.6 kg)  ?02/09/21 127 lb 2 oz (57.7 kg)  ?  ? ?She was last seen for hypertension 04/22/2021 ?BP at that visit was 176/98. ?Management since that visit includes increasing lisinopril to 20mg . ? ?She reports excellent compliance with treatment. ?She is not having side effects.  ? ?Today's BP: ? ?130/86 left arm ?134/86 right arm ? ?

## 2021-05-07 NOTE — Progress Notes (Signed)
BP looks so much better. Continue current regimen! ?

## 2021-05-07 NOTE — Progress Notes (Signed)
Pt advised.   Thanks,   -Holly Navarro  

## 2021-05-24 ENCOUNTER — Encounter: Payer: Self-pay | Admitting: Gastroenterology

## 2021-05-25 DIAGNOSIS — K863 Pseudocyst of pancreas: Secondary | ICD-10-CM | POA: Diagnosis not present

## 2021-05-25 DIAGNOSIS — Z9089 Acquired absence of other organs: Secondary | ICD-10-CM | POA: Diagnosis not present

## 2021-05-25 DIAGNOSIS — K8591 Acute pancreatitis with uninfected necrosis, unspecified: Secondary | ICD-10-CM | POA: Diagnosis not present

## 2021-05-25 DIAGNOSIS — F17290 Nicotine dependence, other tobacco product, uncomplicated: Secondary | ICD-10-CM | POA: Diagnosis not present

## 2021-05-25 DIAGNOSIS — I10 Essential (primary) hypertension: Secondary | ICD-10-CM | POA: Diagnosis not present

## 2021-05-25 DIAGNOSIS — Z9889 Other specified postprocedural states: Secondary | ICD-10-CM | POA: Diagnosis not present

## 2021-05-25 DIAGNOSIS — F17210 Nicotine dependence, cigarettes, uncomplicated: Secondary | ICD-10-CM | POA: Diagnosis not present

## 2021-05-25 DIAGNOSIS — Z794 Long term (current) use of insulin: Secondary | ICD-10-CM | POA: Diagnosis not present

## 2021-05-25 DIAGNOSIS — K449 Diaphragmatic hernia without obstruction or gangrene: Secondary | ICD-10-CM | POA: Diagnosis not present

## 2021-05-25 DIAGNOSIS — K862 Cyst of pancreas: Secondary | ICD-10-CM | POA: Diagnosis not present

## 2021-05-25 DIAGNOSIS — E119 Type 2 diabetes mellitus without complications: Secondary | ICD-10-CM | POA: Diagnosis not present

## 2021-05-25 DIAGNOSIS — K3189 Other diseases of stomach and duodenum: Secondary | ICD-10-CM | POA: Diagnosis not present

## 2021-05-25 DIAGNOSIS — Z79899 Other long term (current) drug therapy: Secondary | ICD-10-CM | POA: Diagnosis not present

## 2021-05-26 ENCOUNTER — Encounter: Payer: Self-pay | Admitting: Internal Medicine

## 2021-06-02 DIAGNOSIS — R933 Abnormal findings on diagnostic imaging of other parts of digestive tract: Secondary | ICD-10-CM | POA: Diagnosis not present

## 2021-06-02 DIAGNOSIS — T182XXA Foreign body in stomach, initial encounter: Secondary | ICD-10-CM | POA: Diagnosis not present

## 2021-06-02 DIAGNOSIS — Z79899 Other long term (current) drug therapy: Secondary | ICD-10-CM | POA: Diagnosis not present

## 2021-06-02 DIAGNOSIS — K863 Pseudocyst of pancreas: Secondary | ICD-10-CM | POA: Diagnosis not present

## 2021-06-02 DIAGNOSIS — F17210 Nicotine dependence, cigarettes, uncomplicated: Secondary | ICD-10-CM | POA: Diagnosis not present

## 2021-06-02 DIAGNOSIS — Z4689 Encounter for fitting and adjustment of other specified devices: Secondary | ICD-10-CM | POA: Diagnosis not present

## 2021-06-07 ENCOUNTER — Other Ambulatory Visit: Payer: Self-pay | Admitting: Internal Medicine

## 2021-06-07 NOTE — Telephone Encounter (Unsigned)
Copied from CRM [id number]. Topic: General - Other >> Jun 07, 2021 12:19 PM Gaetana Michaelis A wrote: Reason for CRM: Medication Refill - Medication: lisinopril (ZESTRIL) 20 MG tablet [[id number]]   Has the patient contacted their pharmacy? Yes.  The patient was directed to contact their PCP (Agent: If no, request that the patient contact the pharmacy for the refill. If patient does not wish to contact the pharmacy document the reason why and proceed with request.) (Agent: If yes, when and what did the pharmacy advise?)  Preferred Pharmacy (with phone number or street name): [facility] [address]), [place] - [address]) [place] [address] Phone: [phone] Fax: [phone] Hours: Not open 24 hours   Has the patient been seen for an appointment in the last year OR does the patient have an upcoming appointment? Yes.    Agent: Please be advised that RX refills may take up to 3 business days. We ask that you follow-up with your pharmacy.

## 2021-06-08 DIAGNOSIS — I10 Essential (primary) hypertension: Secondary | ICD-10-CM | POA: Diagnosis not present

## 2021-06-08 DIAGNOSIS — K8689 Other specified diseases of pancreas: Secondary | ICD-10-CM | POA: Diagnosis not present

## 2021-06-08 DIAGNOSIS — Z79899 Other long term (current) drug therapy: Secondary | ICD-10-CM | POA: Diagnosis not present

## 2021-06-08 DIAGNOSIS — R933 Abnormal findings on diagnostic imaging of other parts of digestive tract: Secondary | ICD-10-CM | POA: Diagnosis not present

## 2021-06-08 DIAGNOSIS — E119 Type 2 diabetes mellitus without complications: Secondary | ICD-10-CM | POA: Diagnosis not present

## 2021-06-08 DIAGNOSIS — T182XXA Foreign body in stomach, initial encounter: Secondary | ICD-10-CM | POA: Diagnosis not present

## 2021-06-08 NOTE — Telephone Encounter (Signed)
Requested Prescriptions  Pending Prescriptions Disp Refills  . lisinopril (ZESTRIL) 20 MG tablet [Pharmacy Med Name: Lisinopril 20 MG Oral Tablet] 30 tablet 0    Sig: Take 1 tablet by mouth once daily     Cardiovascular:  ACE Inhibitors Failed - 06/07/2021 12:16 PM      Failed - Cr in normal range and within 180 days    Creat  Date Value Ref Range Status  04/22/2021 1.34 (H) 0.60 - 1.00 mg/dL Final   Creatinine, Urine  Date Value Ref Range Status  04/22/2021 19 (L) 20 - 275 mg/dL Final         Failed - Last BP in normal range    BP Readings from Last 1 Encounters:  04/22/21 (!) 176/98         Passed - K in normal range and within 180 days    Potassium  Date Value Ref Range Status  04/22/2021 4.4 3.5 - 5.3 mmol/L Final         Passed - Patient is not pregnant      Passed - Valid encounter within last 6 months    Recent Outpatient Visits          1 month ago Aortic atherosclerosis (HCC)   [facility], Salvadore Oxford, NP   5 months ago Medicare annual wellness visit, subsequent   [facility] [place], [place], NP   10 months ago Acute on chronic pancreatitis [facility])   [facility] [place], Salvadore Oxford, NP   12 months ago Gastroesophageal reflux disease, unspecified whether esophagitis present   [facility] [place], Salvadore Oxford, NP   1 year ago Hyperglycemia   [facility] [place], Netta Neat, DO      Future Appointments            In 4 months [place], Salvadore Oxford, NP [facility], [facility]

## 2021-06-10 ENCOUNTER — Encounter: Payer: Self-pay | Admitting: *Deleted

## 2021-06-10 ENCOUNTER — Encounter: Payer: Medicare Other | Attending: Internal Medicine | Admitting: *Deleted

## 2021-06-10 VITALS — BP 158/90 | Ht 66.0 in | Wt 123.0 lb

## 2021-06-10 DIAGNOSIS — Z794 Long term (current) use of insulin: Secondary | ICD-10-CM | POA: Diagnosis not present

## 2021-06-10 DIAGNOSIS — E119 Type 2 diabetes mellitus without complications: Secondary | ICD-10-CM | POA: Insufficient documentation

## 2021-06-10 DIAGNOSIS — Z713 Dietary counseling and surveillance: Secondary | ICD-10-CM | POA: Insufficient documentation

## 2021-06-10 DIAGNOSIS — E1165 Type 2 diabetes mellitus with hyperglycemia: Secondary | ICD-10-CM

## 2021-06-10 NOTE — Patient Instructions (Signed)
Check blood sugars before breakfast or 2 hrs after one meal every day Bring blood sugar records to the next class  Exercise: Continue exercise program   for    20  minutes   7  days a week  Eat 3 meals day 1-2  snacks a day Space meals 4-6 hours apart Allow 2-3 hours between meals and snack Avoid sugar sweetened drinks (juices) unless treating a low blood sugar  Make an eye doctor appointment  Carry fast acting glucose and a snack at all times Rotate injection sites  Return for classes on:

## 2021-06-10 NOTE — Progress Notes (Signed)
Diabetes Self-Management Education  Visit Type: First/Initial  Appt. Start Time: 1330 Appt. End Time: 1450  06/10/2021  [name], identified by name and date of birth, is a 75 y.o. female with a diagnosis of Diabetes: Type 2.   ASSESSMENT  Blood pressure (!) 158/90, height 5\' 6"  (1.676 m), weight 123 lb (55.8 kg). Body mass index is 19.85 kg/m.   Diabetes Self-Management Education - 06/10/21 1501       Visit Information   Visit Type First/Initial      Initial Visit   Diabetes Type Type 2    Date Diagnosed 1 year ago    Are you currently following a meal plan? Yes    What type of meal plan do you follow? "related to her cyst on her pancreas"    Are you taking your medications as prescribed? Yes      Health Coping   How would you rate your overall health? Good      Psychosocial Assessment   Patient Belief/Attitude about Diabetes Other (comment)   "no concerns"   What is the hardest part about your diabetes right now, causing you the most concern, or is the most worrisome to you about your diabetes?   Making healty food and beverage choices;Other (comment)   gaining weight   Self-care barriers None    Self-management support Doctor's office    Patient Concerns Nutrition/Meal planning;Healthy Lifestyle    Special Needs None    Preferred Learning Style Auditory;Visual    Learning Readiness Change in progress    How often do you need to have someone help you when you read instructions, pamphlets, or other written materials from your doctor or pharmacy? 1 - Never    What is the last grade level you completed in school? college      Pre-Education Assessment   Patient understands the diabetes disease and treatment process. Needs Instruction    Patient understands incorporating nutritional management into lifestyle. Needs Instruction    Patient undertands incorporating physical activity into lifestyle. Needs Review    Patient understands using medications safely. Needs  Review    Patient understands monitoring blood glucose, interpreting and using results Needs Review    Patient understands prevention, detection, and treatment of acute complications. Needs Instruction    Patient understands prevention, detection, and treatment of chronic complications. Needs Review    Patient understands how to develop strategies to address psychosocial issues. Needs Instruction    Patient understands how to develop strategies to promote health/change behavior. Needs Instruction      Complications   Last HgB A1C per patient/outside source 10.2 %   04/22/2021   How often do you check your blood sugar? 1-2 times/day    Fasting Blood glucose range (mg/dL) 70-129;130-179   Pt reports fasting blood sugars of 125-150 mg/dL with reading of 165 mg/dL today.   Have you had a dilated eye exam in the past 12 months? Yes    Have you had a dental exam in the past 12 months? No   dentures   Are you checking your feet? Yes    How many days per week are you checking your feet? 7      Dietary Intake   Breakfast oatmeal, honey and berries; egg and toast; omelet with spinach, onions, peppers, grapes; cereal, milk and banana    Lunch [place] or roast beef sandwich    Snack (afternoon) corn chips, pretzels    Dinner chicken, beef, pork, fish; sweet or regular  potatoes, peas, carrots, fruit - melon, mango, green beans, brown rice, wheat pasta, zucchini, spinach, cabbage, tomatoes, cuccumbers, lettuce    Snack (evening) nuts    Beverage(s) water, unsweetened tea, coffee, fruit juice      Activity / Exercise   Activity / Exercise Type Light (walking / raking leaves)   walking, knee lifts, side bends, run in place   How many days per week do you exercise? 7    How many minutes per day do you exercise? 20    Total minutes per week of exercise 140      Patient Education   Previous Diabetes Education No    Disease Pathophysiology Definition of diabetes, type 1 and 2, and the diagnosis of  diabetes;Factors that contribute to the development of diabetes;Explored patient's options for treatment of their diabetes    Healthy Eating Role of diet in the treatment of diabetes and the relationship between the three main macronutrients and blood glucose level;Food label reading, portion sizes and measuring food.;Reviewed blood glucose goals for pre and post meals and how to evaluate the patients' food intake on their blood glucose level.;Meal timing in regards to the patients' current diabetes medication.    Being Active Role of exercise on diabetes management, blood pressure control and cardiac health.    Medications Taught/reviewed insulin/injectables, injection, site rotation, insulin/injectables storage and needle disposal.;Reviewed patients medication for diabetes, action, purpose, timing of dose and side effects.    Monitoring Purpose and frequency of SMBG.;Taught/discussed recording of test results and interpretation of SMBG.;Identified appropriate SMBG and/or A1C goals.    Acute complications Taught prevention, symptoms, and  treatment of hypoglycemia - the 15 rule.    Chronic complications Relationship between chronic complications and blood glucose control;Retinopathy and reason for yearly dilated eye exams    Diabetes Stress and Support Identified and addressed patients feelings and concerns about diabetes;Role of stress on diabetes      Individualized Goals (developed by patient)   Reducing Risk Other (comment)   lead a healthier lifestyle, become more fit     Outcomes   Expected Outcomes Demonstrated interest in learning. Expect positive outcomes         Individualized Plan for Diabetes Self-Management Training:   Learning Objective:  Patient will have a greater understanding of diabetes self-management. Patient education plan is to attend individual and/or group sessions per assessed needs and concerns.   Plan:   Patient Instructions  Check blood sugars before breakfast  or 2 hrs after one meal every day Bring blood sugar records to the next class  Exercise: Continue exercise program   for    20  minutes   7  days a week  Eat 3 meals day 1-2  snacks a day Space meals 4-6 hours apart Allow 2-3 hours between meals and snack Avoid sugar sweetened drinks (juices) unless treating a low blood sugar  Make an eye doctor appointment  Carry fast acting glucose and a snack at all times Rotate injection sites  Return for classes on:     Expected Outcomes:  Demonstrated interest in learning. Expect positive outcomes  Education material provided:  General Meal Planning Guidelines Simple Meal Plan Glucose tablets Symptoms, causes and treatments of Hypoglycemia   If problems or questions, patient to contact team via:   Holly Drilling, RN, [place], [place] [phone]  Future DSME appointment:  Pt to check her calendar and call back to schedule Diabetes classes.

## 2021-06-15 ENCOUNTER — Other Ambulatory Visit: Payer: Self-pay

## 2021-06-15 DIAGNOSIS — K862 Cyst of pancreas: Secondary | ICD-10-CM

## 2021-06-15 NOTE — Telephone Encounter (Signed)
Case discussed last week with Dr. Nanda Quinton from [facility] advanced endoscopy. Please schedule CT scan at the end of this week or by Monday of next week. CT abdomen/pelvis with IV contrast. Please tentatively set her up for EGD with stent pull AXIOS on June 22. Thanks. GM

## 2021-06-18 ENCOUNTER — Telehealth: Payer: Self-pay | Admitting: *Deleted

## 2021-06-18 NOTE — Telephone Encounter (Signed)
Phone call from patient. She reports she is not able to attend Diabetes classes due to upcoming tests and being out of the state in the next month. She requested to come to the 2 Hour Refresher Program. Her next appointment is scheduled for June 27 with the educator.

## 2021-06-22 ENCOUNTER — Encounter (HOSPITAL_COMMUNITY): Payer: Self-pay | Admitting: Gastroenterology

## 2021-06-22 ENCOUNTER — Ambulatory Visit
Admission: RE | Admit: 2021-06-22 | Discharge: 2021-06-22 | Disposition: A | Discharge status: Home / Self Care | Payer: Medicare Other | Source: Ambulatory Visit | Attending: Gastroenterology | Admitting: Gastroenterology

## 2021-06-22 DIAGNOSIS — K862 Cyst of pancreas: Secondary | ICD-10-CM | POA: Diagnosis not present

## 2021-06-22 DIAGNOSIS — K8689 Other specified diseases of pancreas: Secondary | ICD-10-CM | POA: Diagnosis not present

## 2021-06-22 LAB — POCT I-STAT CREATININE: Creatinine, Ser: 1.2 mg/dL — ABNORMAL HIGH (ref 0.44–1.00)

## 2021-06-22 MED ORDER — IOHEXOL 300 MG/ML  SOLN
100.0000 mL | Freq: Once | INTRAMUSCULAR | Status: AC | PRN
Start: 2021-06-22 — End: 2021-06-22
  Administered 2021-06-22: 100 mL via INTRAVENOUS

## 2021-06-23 ENCOUNTER — Encounter: Payer: Self-pay | Admitting: Gastroenterology

## 2021-06-24 ENCOUNTER — Ambulatory Visit (HOSPITAL_COMMUNITY): Payer: Medicare Other | Admitting: Anesthesiology

## 2021-06-24 ENCOUNTER — Telehealth: Payer: Self-pay | Admitting: Internal Medicine

## 2021-06-24 ENCOUNTER — Other Ambulatory Visit: Payer: Self-pay

## 2021-06-24 ENCOUNTER — Ambulatory Visit (HOSPITAL_BASED_OUTPATIENT_CLINIC_OR_DEPARTMENT_OTHER): Payer: Medicare Other | Admitting: Anesthesiology

## 2021-06-24 ENCOUNTER — Encounter (HOSPITAL_COMMUNITY): Payer: Self-pay | Admitting: Gastroenterology

## 2021-06-24 ENCOUNTER — Ambulatory Visit (HOSPITAL_COMMUNITY)
Admission: RE | Admit: 2021-06-24 | Discharge: 2021-06-24 | Disposition: A | Discharge status: Home / Self Care | Payer: Medicare Other | Attending: Gastroenterology | Admitting: Gastroenterology

## 2021-06-24 ENCOUNTER — Encounter (HOSPITAL_COMMUNITY)
Admission: RE | Disposition: A | Discharge status: Home / Self Care | Payer: Self-pay | Source: Home / Self Care | Attending: Gastroenterology

## 2021-06-24 DIAGNOSIS — K259 Gastric ulcer, unspecified as acute or chronic, without hemorrhage or perforation: Secondary | ICD-10-CM | POA: Diagnosis not present

## 2021-06-24 DIAGNOSIS — K449 Diaphragmatic hernia without obstruction or gangrene: Secondary | ICD-10-CM

## 2021-06-24 DIAGNOSIS — E785 Hyperlipidemia, unspecified: Secondary | ICD-10-CM

## 2021-06-24 DIAGNOSIS — Z09 Encounter for follow-up examination after completed treatment for conditions other than malignant neoplasm: Secondary | ICD-10-CM | POA: Insufficient documentation

## 2021-06-24 DIAGNOSIS — K2970 Gastritis, unspecified, without bleeding: Secondary | ICD-10-CM

## 2021-06-24 DIAGNOSIS — R739 Hyperglycemia, unspecified: Secondary | ICD-10-CM

## 2021-06-24 DIAGNOSIS — R569 Unspecified convulsions: Secondary | ICD-10-CM | POA: Diagnosis not present

## 2021-06-24 DIAGNOSIS — K862 Cyst of pancreas: Secondary | ICD-10-CM | POA: Diagnosis not present

## 2021-06-24 DIAGNOSIS — F17210 Nicotine dependence, cigarettes, uncomplicated: Secondary | ICD-10-CM | POA: Insufficient documentation

## 2021-06-24 DIAGNOSIS — Z4659 Encounter for fitting and adjustment of other gastrointestinal appliance and device: Secondary | ICD-10-CM | POA: Diagnosis not present

## 2021-06-24 DIAGNOSIS — K863 Pseudocyst of pancreas: Secondary | ICD-10-CM | POA: Diagnosis not present

## 2021-06-24 DIAGNOSIS — I10 Essential (primary) hypertension: Secondary | ICD-10-CM | POA: Insufficient documentation

## 2021-06-24 DIAGNOSIS — K8689 Other specified diseases of pancreas: Secondary | ICD-10-CM | POA: Diagnosis not present

## 2021-06-24 DIAGNOSIS — K222 Esophageal obstruction: Secondary | ICD-10-CM | POA: Insufficient documentation

## 2021-06-24 DIAGNOSIS — Z8719 Personal history of other diseases of the digestive system: Secondary | ICD-10-CM

## 2021-06-24 HISTORY — PX: STENT REMOVAL: SHX6421

## 2021-06-24 HISTORY — PX: BIOPSY: SHX5522

## 2021-06-24 HISTORY — PX: FOREIGN BODY REMOVAL: SHX962

## 2021-06-24 HISTORY — PX: CYST GASTROSTOMY: SHX6862

## 2021-06-24 HISTORY — PX: ESOPHAGOGASTRODUODENOSCOPY (EGD) WITH PROPOFOL: SHX5813

## 2021-06-24 LAB — GLUCOSE, CAPILLARY: Glucose-Capillary: 151 mg/dL — ABNORMAL HIGH (ref 70–99)

## 2021-06-24 SURGERY — ESOPHAGOGASTRODUODENOSCOPY (EGD) WITH PROPOFOL
Anesthesia: Monitor Anesthesia Care

## 2021-06-24 MED ORDER — OMEPRAZOLE 40 MG PO CPDR: 40.0000 mg | DELAYED_RELEASE_CAPSULE | Freq: Two times a day (BID) | ORAL | 12 refills | Status: DC

## 2021-06-24 MED ORDER — PANTOPRAZOLE SODIUM 40 MG PO TBEC: 40.0000 mg | DELAYED_RELEASE_TABLET | Freq: Two times a day (BID) | ORAL | 6 refills | Status: DC

## 2021-06-24 MED ORDER — PROPOFOL 500 MG/50ML IV EMUL
INTRAVENOUS | Status: DC | PRN
  Administered 2021-06-24: 100 ug/kg/min via INTRAVENOUS
  Administered 2021-06-24: 20 mg via INTRAVENOUS
  Administered 2021-06-24: 30 mg via INTRAVENOUS
  Administered 2021-06-24: 20 mg via INTRAVENOUS

## 2021-06-24 MED ORDER — LACTATED RINGERS IV SOLN: INTRAVENOUS | Status: DC

## 2021-06-24 MED ORDER — CIPROFLOXACIN IN D5W 400 MG/200ML IV SOLN
INTRAVENOUS | Status: DC | PRN
  Administered 2021-06-24: 400 mg via INTRAVENOUS

## 2021-06-24 MED ORDER — PHENYLEPHRINE 80 MCG/ML (10ML) SYRINGE FOR IV PUSH (FOR BLOOD PRESSURE SUPPORT)
PREFILLED_SYRINGE | INTRAVENOUS | Status: DC | PRN
  Administered 2021-06-24 (×2): 80 ug via INTRAVENOUS

## 2021-06-24 MED ORDER — CIPROFLOXACIN IN D5W 400 MG/200ML IV SOLN
INTRAVENOUS | Status: AC
  Filled 2021-06-24: qty 200

## 2021-06-24 MED ORDER — CIPROFLOXACIN HCL 500 MG PO TABS: 500.0000 mg | ORAL_TABLET | Freq: Two times a day (BID) | ORAL | 0 refills | Status: AC

## 2021-06-24 MED ORDER — SODIUM CHLORIDE 0.9 % IV SOLN: INTRAVENOUS | Status: DC

## 2021-06-24 MED ORDER — ESMOLOL HCL 100 MG/10ML IV SOLN
INTRAVENOUS | Status: DC | PRN
  Administered 2021-06-24 (×3): 10 mg via INTRAVENOUS

## 2021-06-24 MED ORDER — TRESIBA FLEXTOUCH 100 UNIT/ML ~~LOC~~ SOPN: 15.0000 [IU] | PEN_INJECTOR | Freq: Every day | SUBCUTANEOUS | 0 refills | Status: DC

## 2021-06-24 SURGICAL SUPPLY — 15 items

## 2021-06-24 NOTE — Op Note (Signed)
[facility] Patient Name: Holly Navarro Procedure Date : 06/24/2021 MRN: [id number] Attending MD: Justice Britain , MD Date of Birth: 1945-03-30 CSN: [id number] Age: 76 Admit Type: Outpatient Procedure:                Upper GI endoscopy Indications:              Stent removal, Stent follow-up, Pancreatic                            necrosis, Pancreatic pseudocyst Providers:                Justice Britain, MD, Burtis Junes, RN, Cletis Athens,                            Technician Referring MD:             Jearld Fenton, Lenetta Quaker. Cephas Darby, MD, Murray Hodgkins                            Naomie Dean Medicines:                Monitored Anesthesia Care, Cipro 975 mg IV Complications:            No immediate complications. Estimated Blood Loss:     Estimated blood loss was minimal. Procedure:                Pre-Anesthesia Assessment:                           - Prior to the procedure, a History and Physical                            was performed, and patient medications and                            allergies were reviewed. The patient's tolerance of                            previous anesthesia was also reviewed. The risks                            and benefits of the procedure and the sedation                            options and risks were discussed with the patient.                            All questions were answered, and informed consent                            was obtained. Prior Anticoagulants: The patient has                            taken no previous anticoagulant or antiplatelet  agents. ASA Grade Assessment: III - A patient with                            severe systemic disease. After reviewing the risks                            and benefits, the patient was deemed in                            satisfactory condition to undergo the procedure.                           After obtaining informed consent, the endoscope  was                            passed under direct vision. Throughout the                            procedure, the patient's blood pressure, pulse, and                            oxygen saturations were monitored continuously. The                            [id number]-[id number] ([id number]) Therapeutic endoscope was                            introduced through the mouth, and advanced to the                            second part of duodenum. The [id number]-[id number] ([id number])                            Olympus duodenoscope was introduced through the                            mouth, and advanced to the area of papilla. The                            upper GI endoscopy was accomplished without                            difficulty. The patient tolerated the procedure. Scope In: Scope Out: Findings:      No gross lesions were noted in the entire esophagus.      A non-obstructing Schatzki ring was found at the gastroesophageal       junction.      The Z-line was regular and was found 38 cm from the incisors.      A 2 cm hiatal hernia was present.      Three non-bleeding cratered gastric ulcers with a clean ulcer base       (Forrest Class III) were found in the gastric body and in the gastric       antrum. The largest lesion was 10 mm in largest dimension.  Diffuse moderate inflammation characterized by erosions, erythema and       friability was found in the entire examined stomach. Biopsies were taken       with a cold forceps for histology and Helicobacter pylori testing.      A previously placed AXIOS cystgastrostomy stent with a double pigtail       through it were found on the posterior wall of the gastric body. The one       double pigtail stent was removed from the cystgastrostomy with a snare.       The cystgastrostomy tract was entered into the pseudocyst/walled off       necrosis cavity under direct vision. The cyst was partially filled with       fluid and black necrotic tissue that was pasty and  adherent to the cyst       wall. Necrosectomy was performed with a Raptor grasping device and       rat-toothed forceps, significant lavage, requiring multiple intubations       of the cyst. At the conclusion of the procedure, very little necrotic       tissue and a medium amount of pink, viable tissue was found within the       pseudocyst on direct vision. AXIOS stent removal was accomplished with a       Raptor grasping device.      No gross lesions were noted in the duodenal bulb, in the first portion       of the duodenum and in the second portion of the duodenum.      The ampulla was normal. Impression:               - No gross lesions in esophagus. Non-obstructing                            Schatzki ring. Z-line regular, 38 cm from the                            incisors.                           - 2 cm hiatal hernia.                           - Non-bleeding gastric ulcers with a clean ulcer                            base (Forrest Class III). Gastritis. Biopsied.                           - Pre-existing AXIOS cystgastostomy and double                            pigtail stents in placed.                           - Necrosectomy was performed.                           - No gross lesions in the duodenal bulb, in the  first portion of the duodenum and in the second                            portion of the duodenum. Normal ampulla. Recommendation:           - The patient will be observed post-procedure,                            until all discharge criteria are met.                           - Discharge patient to home.                           - Patient has a contact number available for                            emergencies. The signs and symptoms of potential                            delayed complications were discussed with the                            patient. Return to normal activities tomorrow.                            Written discharge  instructions were provided to the                            patient.                           - Low fat diet.                           - Ciprofloxacin 500 mg twice daily for 1-week.                           - Repeat EGD in 43-month to ensure healing of                            gastric ulcers.                           - Start Omeprazole 40 mg twice daily to aid in                            healing of gastritis and gastric ulcers.                           - Continue present medications otherwise.                           - Repeat CT-Abdomen Pancreas Protocol in 6-8 weeks  with follow up in clinic thereafter.                           - Await pathology results.                           - The findings and recommendations were discussed                            with the patient.                           - The findings and recommendations were discussed                            with the designated responsible adult. Procedure Code(s):        --- Professional ---                           [phone], Esophagogastroduodenoscopy, flexible,                            transoral; with removal of foreign body(s)                           43239, Esophagogastroduodenoscopy, flexible,                            transoral; with biopsy, single or multiple                           48999, Unlisted procedure, pancreas Diagnosis Code(s):        --- Professional ---                           K22.2, Esophageal obstruction                           K44.9, Diaphragmatic hernia without obstruction or                            gangrene                           K25.9, Gastric ulcer, unspecified as acute or                            chronic, without hemorrhage or perforation                           K29.70, Gastritis, unspecified, without bleeding                           Z97.8, Presence of other specified devices                           Z46.59, Encounter for fitting and  adjustment of  other gastrointestinal appliance and device                           Z09, Encounter for follow-up examination after                            completed treatment for conditions other than                            malignant neoplasm                           K86.89, Other specified diseases of pancreas                           K86.3, Pseudocyst of pancreas CPT copyright 2019 American Medical Association. All rights reserved. The codes documented in this report are preliminary and upon coder review may  be revised to meet current compliance requirements. Justice Britain, MD 06/24/2021 12:19:43 PM Number of Addenda: 0

## 2021-06-24 NOTE — Anesthesia Preprocedure Evaluation (Signed)
Anesthesia Evaluation  Patient identified by MRN, date of birth, ID band Patient awake    Reviewed: Allergy & Precautions, NPO status , Patient's Chart, lab work & pertinent test results, reviewed documented beta blocker date and time   Airway Mallampati: II  TM Distance: >3 FB Neck ROM: Full    Dental  (+) Dental Advisory Given, Edentulous Upper, Partial Lower, Missing,    Pulmonary neg pulmonary ROS, former smoker,    Pulmonary exam normal breath sounds clear to auscultation       Cardiovascular hypertension, Pt. on medications and Pt. on home beta blockers negative cardio ROS Normal cardiovascular exam Rhythm:Regular Rate:Normal  EKG 02/29/20 NSR, LAD, anteroseptal MI age undetermined  Echo 03/02/20 1. Left ventricular ejection fraction, by estimation, is 65 to 70%. The left ventricle has normal function. The left ventricle has no regional wall motion abnormalities. Left ventricular diastolic parameters are consistent with Grade I diastolic dysfunction (impaired relaxation).  2. Right ventricular systolic function is normal. The right ventricular size is normal. Tricuspid regurgitation signal is inadequate for assessing PA pressure.  3. The mitral valve is normal in structure. No evidence of mitral valve regurgitation. No evidence of mitral stenosis.  4. The aortic valve is normal in structure. Aortic valve regurgitation is not visualized. Mild aortic valve stenosis. Aortic valve area, by VTI measures 2.07 cm. Aortic valve mean gradient measures 6.0 mmHg.  5. The inferior vena cava is normal in size with greater than 50% respiratory variability, suggesting right atrial pressure of 3 mmHg.   Neuro/Psych Seizures -, Well Controlled,  Anxiety negative neurological ROS  negative psych ROS   GI/Hepatic negative GI ROS, Neg liver ROS, PUD, Pancreatic pseudocyst Hx/o idiopathic necrotizing pancreatitis   Endo/Other  negative  endocrine ROSdiabetesHyperlipidemia  Renal/GU Renal InsufficiencyRenal diseasenegative Renal ROS  negative genitourinary   Musculoskeletal negative musculoskeletal ROS (+) Arthritis , Osteoarthritis,    Abdominal   Peds negative pediatric ROS (+)  Hematology negative hematology ROS (+) Blood dyscrasia, anemia ,   Anesthesia Other Findings   Reproductive/Obstetrics negative OB ROS                             Anesthesia Physical  Anesthesia Plan  ASA: 3  Anesthesia Plan: MAC   Post-op Pain Management: Minimal or no pain anticipated   Induction: Intravenous  PONV Risk Score and Plan: 2 and Treatment may vary due to age or medical condition and Propofol infusion  Airway Management Planned: Nasal Cannula  Additional Equipment: None  Intra-op Plan:   Post-operative Plan:   Informed Consent: I have reviewed the patients History and Physical, chart, labs and discussed the procedure including the risks, benefits and alternatives for the proposed anesthesia with the patient or authorized representative who has indicated his/her understanding and acceptance.     Dental advisory given  Plan Discussed with: CRNA and Anesthesiologist  Anesthesia Plan Comments: (  )        Anesthesia Quick Evaluation

## 2021-06-24 NOTE — Transfer of Care (Signed)
Immediate Anesthesia Transfer of Care Note  Patient: Holly Navarro  Procedure(s) Performed: ESOPHAGOGASTRODUODENOSCOPY (EGD) WITH PROPOFOL STENT REMOVAL BIOPSY FOREIGN BODY REMOVAL  Patient Location: PACU  Anesthesia Type:MAC  Level of Consciousness: drowsy  Airway & Oxygen Therapy: Patient Spontanous Breathing and Patient connected to nasal cannula oxygen  Post-op Assessment: Report given to RN and Post -op Vital signs reviewed and stable  Post vital signs: Reviewed and stable  Last Vitals:  Vitals Value Taken Time  BP    Temp    Pulse    Resp    SpO2      Last Pain:  Vitals:   06/24/21 1038  TempSrc: Temporal  PainSc: 0-No pain         Complications: No notable events documented.

## 2021-06-24 NOTE — Telephone Encounter (Signed)
Pt is calling to report that the pharmacy will not refill her script of her TRESIBA FLEXTOUCH 100 UNIT/ML FlexTouch Pen [[id number]]  Pt reports that she is up to 15 units. Please advise CB- [phone] or [phone]

## 2021-06-24 NOTE — Addendum Note (Signed)
Addended by: Lorre Munroe on: 06/24/2021 02:32 PM   Modules accepted: Orders

## 2021-06-24 NOTE — H&P (Signed)
GASTROENTEROLOGY PROCEDURE H&P NOTE   Primary Care Physician: Lorre Munroe, NP  HPI: Holly Navarro is a 75 y.o. female who presents for EGD for AXIOS stent pull cystgastrostomy in setting of previous pancreas necrosectomy.  Past Medical History:  Diagnosis Date   Anemia    Anxiety    Arthritis    Cardiac arrhythmia    Diabetes mellitus without complication (HCC)    Hyperlipidemia    Hypertension    Pancreatic pseudocyst    Seizure (HCC)    Stomach ulcer    Past Surgical History:  Procedure Laterality Date   BIOPSY  05/20/2020   Procedure: BIOPSY;  Surgeon: Lemar Lofty., MD;  Location: [facility];  Service: Gastroenterology;;   BIOPSY  02/25/2021   Procedure: BIOPSY;  Surgeon: Lemar Lofty., MD;  Location: [facility] ENDOSCOPY;  Service: Gastroenterology;;   ESOPHAGOGASTRODUODENOSCOPY N/A 05/20/2020   Procedure: ESOPHAGOGASTRODUODENOSCOPY (EGD);  Surgeon: Lemar Lofty., MD;  Location: [facility];  Service: Gastroenterology;  Laterality: N/A;   ESOPHAGOGASTRODUODENOSCOPY (EGD) WITH PROPOFOL N/A 02/25/2021   Procedure: ESOPHAGOGASTRODUODENOSCOPY (EGD) WITH PROPOFOL;  Surgeon: Meridee Score Netty Starring., MD;  Location: [facility] ENDOSCOPY;  Service: Gastroenterology;  Laterality: N/A;   LAPAROSCOPIC OVARIAN CYSTECTOMY     TONSILLECTOMY     UPPER ESOPHAGEAL ENDOSCOPIC ULTRASOUND (EUS) N/A 05/20/2020   Procedure: UPPER ESOPHAGEAL ENDOSCOPIC ULTRASOUND (EUS)  ;  Surgeon: Lemar Lofty., MD;  Location: [facility];  Service: Gastroenterology;  Laterality: N/A;   UPPER ESOPHAGEAL ENDOSCOPIC ULTRASOUND (EUS) N/A 02/25/2021   Procedure: UPPER ESOPHAGEAL ENDOSCOPIC ULTRASOUND (EUS);  Surgeon: Lemar Lofty., MD;  Location: [facility] ENDOSCOPY;  Service: Gastroenterology;  Laterality: N/A;   Current Facility-Administered Medications  Medication Dose Route Frequency Provider Last Rate Last Admin   0.9 %  sodium chloride infusion   Intravenous Continuous  Mansouraty, Netty Starring., MD       lactated ringers infusion   Intravenous Continuous Mansouraty, Netty Starring., MD 20 mL/hr at 06/24/21 1059 New Bag at 06/24/21 1059    Current Facility-Administered Medications:    0.9 %  sodium chloride infusion, , Intravenous, Continuous, Mansouraty, Netty Starring., MD   lactated ringers infusion, , Intravenous, Continuous, Mansouraty, Netty Starring., MD, Last Rate: 20 mL/hr at 06/24/21 1059, New Bag at 06/24/21 1059 Allergies  Allergen Reactions   Fentanyl Other (See Comments)    Patient preference, not an allergy    Morphine And Related Other (See Comments)    Patient preference, not an allergy     Family History  Problem Relation Age of Onset   Heart disease Mother    Kidney disease Mother    Diabetes Father    Pancreatic cancer Brother        Agent Orange   Gallstones Daughter    Colon cancer Son 38   Diabetes Paternal Uncle        x 2   Esophageal cancer Neg Hx    Inflammatory bowel disease Neg Hx    Liver disease Neg Hx    Stomach cancer Neg Hx    Social History   Socioeconomic History   Marital status: Widowed    Spouse name: Not on file   Number of children: 4   Years of education: Not on file   Highest education level: Not on file  Occupational History   Occupation: retired  Tobacco Use   Smoking status: Every Day    Packs/day: 0.25    Years: 60.00    Total pack years: 15.00  Types: Cigarettes   Smokeless tobacco: Never  Vaping Use   Vaping Use: Never used  Substance and Sexual Activity   Alcohol use: Not Currently    Alcohol/week: 0.0 standard drinks of alcohol    Comment: occasionally    Drug use: No   Sexual activity: Not on file  Other Topics Concern   Not on file  Social History Narrative   Not on file   Social Determinants of Health   Financial Resource Strain: Not on file  Food Insecurity: Not on file  Transportation Needs: Not on file  Physical Activity: Not on file  Stress: Not on file  Social  Connections: Not on file  Intimate Partner Violence: Not on file    Physical Exam: Today's Vitals   06/24/21 1038  BP: (!) 110/58  Pulse: (!) 57  Resp: 15  Temp: (!) 97.1 F (36.2 C)  TempSrc: Temporal  SpO2: 99%  Weight: 55.8 kg  Height: 5\' 6"  (1.676 m)  PainSc: 0-No pain   Body mass index is 19.85 kg/m. GEN: NAD EYE: Sclerae anicteric ENT: MMM CV: Non-tachycardic GI: Soft, NT/ND NEURO:  Alert & Oriented x 3  Lab Results: No results for input(s): "WBC", "HGB", "HCT", "PLT" in the last 72 hours. BMET Recent Labs    06/22/21 1114  CREATININE 1.20*   LFT No results for input(s): "PROT", "ALBUMIN", "AST", "ALT", "ALKPHOS", "BILITOT", "BILIDIR", "IBILI" in the last 72 hours. PT/INR No results for input(s): "LABPROT", "INR" in the last 72 hours.   Impression / Plan: This is a 76 y.o.female who presents for EGD for AXIOS stent pull cystgastrostomy in setting of previous pancreas necrosectomy.  The risks and benefits of endoscopic evaluation/treatment were discussed with the patient and/or family; these include but are not limited to the risk of perforation, infection, bleeding, missed lesions, lack of diagnosis, severe illness requiring hospitalization, as well as anesthesia and sedation related illnesses.  The patient's history has been reviewed, patient examined, no change in status, and deemed stable for procedure.  The patient and/or family is agreeable to proceed.    73, MD [facility] Office # [place]

## 2021-06-25 ENCOUNTER — Encounter: Payer: Self-pay | Admitting: Internal Medicine

## 2021-06-25 ENCOUNTER — Encounter (HOSPITAL_COMMUNITY): Payer: Self-pay | Admitting: Gastroenterology

## 2021-06-25 ENCOUNTER — Other Ambulatory Visit: Payer: Self-pay | Admitting: Internal Medicine

## 2021-06-25 DIAGNOSIS — Z8719 Personal history of other diseases of the digestive system: Secondary | ICD-10-CM

## 2021-06-25 DIAGNOSIS — R739 Hyperglycemia, unspecified: Secondary | ICD-10-CM

## 2021-06-25 LAB — SURGICAL PATHOLOGY

## 2021-06-29 ENCOUNTER — Encounter: Payer: Medicare Other | Admitting: *Deleted

## 2021-06-29 ENCOUNTER — Encounter: Payer: Self-pay | Admitting: *Deleted

## 2021-06-29 VITALS — BP 120/80 | Wt 120.0 lb

## 2021-06-29 DIAGNOSIS — Z713 Dietary counseling and surveillance: Secondary | ICD-10-CM | POA: Diagnosis not present

## 2021-06-29 DIAGNOSIS — E119 Type 2 diabetes mellitus without complications: Secondary | ICD-10-CM | POA: Diagnosis not present

## 2021-06-29 DIAGNOSIS — Z794 Long term (current) use of insulin: Secondary | ICD-10-CM | POA: Diagnosis not present

## 2021-06-29 DIAGNOSIS — E1165 Type 2 diabetes mellitus with hyperglycemia: Secondary | ICD-10-CM

## 2021-07-05 ENCOUNTER — Other Ambulatory Visit: Payer: Self-pay | Admitting: Internal Medicine

## 2021-07-05 NOTE — Telephone Encounter (Signed)
Medication Refill - Medication: lisinopril (ZESTRIL) 20 MG tablet   Pt needs 90 day supply, she is going away until the middle of September  Has the patient contacted their pharmacy? Yes.   (Agent: If no, request that the patient contact the pharmacy for the refill. If patient does not wish to contact the pharmacy document the reason why and proceed with request.) (Agent: If yes, when and what did the pharmacy advise?)  Preferred Pharmacy (with phone number or street name):  [facility] [address]), [place] - [address]  [address]) [place] [address]  Phone: [phone] Fax: [phone]   Has the patient been seen for an appointment in the last year OR does the patient have an upcoming appointment? Yes.    Agent: Please be advised that RX refills may take up to 3 business days. We ask that you follow-up with your pharmacy.

## 2021-07-06 ENCOUNTER — Encounter: Payer: Self-pay | Admitting: Gastroenterology

## 2021-07-07 MED ORDER — LISINOPRIL 20 MG PO TABS: 20.0000 mg | ORAL_TABLET | Freq: Every day | ORAL | 0 refills | Status: DC

## 2021-07-07 NOTE — Telephone Encounter (Signed)
Requested Prescriptions  Pending Prescriptions Disp Refills  . lisinopril (ZESTRIL) 20 MG tablet 30 tablet 0    Sig: Take 1 tablet (20 mg total) by mouth daily.     Cardiovascular:  ACE Inhibitors Failed - 07/05/2021  2:07 PM      Failed - Cr in normal range and within 180 days    Creat  Date Value Ref Range Status  04/22/2021 1.34 (H) 0.60 - 1.00 mg/dL Final   Creatinine, Ser  Date Value Ref Range Status  06/22/2021 1.20 (H) 0.44 - 1.00 mg/dL Final   Creatinine, Urine  Date Value Ref Range Status  04/22/2021 19 (L) 20 - 275 mg/dL Final         Passed - K in normal range and within 180 days    Potassium  Date Value Ref Range Status  04/22/2021 4.4 3.5 - 5.3 mmol/L Final         Passed - Patient is not pregnant      Passed - Last BP in normal range    BP Readings from Last 1 Encounters:  06/29/21 120/80         Passed - Valid encounter within last 6 months    Recent Outpatient Visits          2 months ago Aortic atherosclerosis [facility])   [facility], Salvadore Oxford, NP   6 months ago Medicare annual wellness visit, subsequent   [facility] [place], [place], NP   11 months ago Acute on chronic pancreatitis [facility])   [facility] [place], Salvadore Oxford, NP   1 year ago Gastroesophageal reflux disease, unspecified whether esophagitis present   [facility] [place], Salvadore Oxford, NP   1 year ago Hyperglycemia   [facility] [place], Netta Neat, DO      Future Appointments            In 3 months [place], Salvadore Oxford, NP [facility], [facility]

## 2021-07-08 MED ORDER — LISINOPRIL 20 MG PO TABS: 20.0000 mg | ORAL_TABLET | Freq: Every day | ORAL | 0 refills | Status: DC

## 2021-07-08 NOTE — Telephone Encounter (Signed)
Pt is calling to request a 90 day script. Pt states that she will be traveling up [place] and will not return until September. Please advise

## 2021-07-08 NOTE — Telephone Encounter (Signed)
Requested medication (s) are due for refill today:   Was approved for 30 days on 07/07/2021.   Pt requesting 90 day supply because going up [place] and won't be back until Sept.  She has appt. In Sept. For f/u.  Requested medication (s) are on the active medication list:   Yes  Future visit scheduled:   Yes   Last ordered: 07/07/2021 #30, 0 refills but requesting 90 day supply due to traveling.  Returned for provider to review for 90 day supply.      Requested Prescriptions  Pending Prescriptions Disp Refills   lisinopril (ZESTRIL) 20 MG tablet 30 tablet 0    Sig: Take 1 tablet (20 mg total) by mouth daily.     Cardiovascular:  ACE Inhibitors Failed - 07/08/2021  1:37 PM      Failed - Cr in normal range and within 180 days    Creat  Date Value Ref Range Status  04/22/2021 1.34 (H) 0.60 - 1.00 mg/dL Final   Creatinine, Ser  Date Value Ref Range Status  06/22/2021 1.20 (H) 0.44 - 1.00 mg/dL Final   Creatinine, Urine  Date Value Ref Range Status  04/22/2021 19 (L) 20 - 275 mg/dL Final         Passed - K in normal range and within 180 days    Potassium  Date Value Ref Range Status  04/22/2021 4.4 3.5 - 5.3 mmol/L Final         Passed - Patient is not pregnant      Passed - Last BP in normal range    BP Readings from Last 1 Encounters:  06/29/21 120/80         Passed - Valid encounter within last 6 months    Recent Outpatient Visits           2 months ago Aortic atherosclerosis [facility])   [facility], Salvadore Oxford, NP   6 months ago Medicare annual wellness visit, subsequent   [facility] [place], [place], NP   11 months ago Acute on chronic pancreatitis [facility])   [facility] [place], Salvadore Oxford, NP   1 year ago Gastroesophageal reflux disease, unspecified whether esophagitis present   [facility] [place], Salvadore Oxford, NP   1 year ago Hyperglycemia   [facility] [place], Netta Neat, DO        Future Appointments             In 3 months Baity, Salvadore Oxford, NP [facility]            Signed Prescriptions Disp Refills   lisinopril (ZESTRIL) 20 MG tablet 30 tablet 0    Sig: Take 1 tablet (20 mg total) by mouth daily.     Cardiovascular:  ACE Inhibitors Failed - 07/05/2021  2:07 PM      Failed - Cr in normal range and within 180 days    Creat  Date Value Ref Range Status  04/22/2021 1.34 (H) 0.60 - 1.00 mg/dL Final   Creatinine, Ser  Date Value Ref Range Status  06/22/2021 1.20 (H) 0.44 - 1.00 mg/dL Final   Creatinine, Urine  Date Value Ref Range Status  04/22/2021 19 (L) 20 - 275 mg/dL Final         Passed - K in normal range and within 180 days    Potassium  Date Value Ref Range Status  04/22/2021 4.4 3.5 - 5.3  mmol/L Final         Passed - Patient is not pregnant      Passed - Last BP in normal range    BP Readings from Last 1 Encounters:  06/29/21 120/80         Passed - Valid encounter within last 6 months    Recent Outpatient Visits           2 months ago Aortic atherosclerosis [facility])   [facility], Salvadore Oxford, NP   6 months ago Medicare annual wellness visit, subsequent   [facility] [place], Salvadore Oxford, NP   11 months ago Acute on chronic pancreatitis [facility])   [facility] [place], Salvadore Oxford, NP   1 year ago Gastroesophageal reflux disease, unspecified whether esophagitis present   [facility] [place], Salvadore Oxford, NP   1 year ago Hyperglycemia   [facility] [place], Netta Neat, DO       Future Appointments             In 3 months Baity, Salvadore Oxford, NP [facility], [facility]

## 2021-07-08 NOTE — Addendum Note (Signed)
Addended by: Elby Beck F on: 07/08/2021 01:37 PM   Modules accepted: Orders

## 2021-07-19 ENCOUNTER — Encounter: Payer: Self-pay | Admitting: Gastroenterology

## 2021-07-19 NOTE — Telephone Encounter (Signed)
Holly Navarro, Please let the patient know we wish her the best on her vacation. She can get ciprofloxacin 500 mg twice daily x3 days to have available if she has fevers/chills/progressive abdominal pain to take until she can be evaluated at a local ED or urgent care and should also send [place] a message if that is the case to try to decrease risk of postinfectious complications. Please also ensure she has follow-up in clinic when she returns with a repeat CT abdomen with IV and oral contrast a few days or a week before her follow-up appointment. Thanks. GM

## 2021-07-20 ENCOUNTER — Other Ambulatory Visit: Payer: Self-pay

## 2021-07-20 DIAGNOSIS — K863 Pseudocyst of pancreas: Secondary | ICD-10-CM

## 2021-07-20 MED ORDER — CIPROFLOXACIN HCL 500 MG PO TABS: 500.0000 mg | ORAL_TABLET | Freq: Two times a day (BID) | ORAL | 0 refills | Status: AC

## 2021-08-27 ENCOUNTER — Encounter: Payer: Self-pay | Admitting: Gastroenterology

## 2021-09-01 ENCOUNTER — Ambulatory Visit: Payer: Medicare Other | Admitting: Gastroenterology

## 2021-09-07 ENCOUNTER — Telehealth: Payer: Self-pay

## 2021-09-07 NOTE — Telephone Encounter (Signed)
-----   Message from Loretha Stapler, RN sent at 07/07/2021  9:22 AM EDT ----- Set up a pancreas protocol CT abdomen in 2 months.  Clinic visit to follow within a week or 2 after CT.  I believe she will be going on vacation to the [place] so this can be scheduled after she returns if she wishes.

## 2021-09-07 NOTE — Telephone Encounter (Signed)
Message sent to the pt via My Chart asking her to let us know if we can order CT and make follow up appt.  Will await response.

## 2021-09-07 NOTE — Telephone Encounter (Signed)
The pt has been scheduled for CT and appt.

## 2021-09-13 ENCOUNTER — Ambulatory Visit (HOSPITAL_COMMUNITY)
Admission: RE | Admit: 2021-09-13 | Discharge: 2021-09-13 | Disposition: A | Discharge status: Home / Self Care | Payer: Medicare Other | Source: Ambulatory Visit | Attending: Gastroenterology | Admitting: Gastroenterology

## 2021-09-13 DIAGNOSIS — Z794 Long term (current) use of insulin: Secondary | ICD-10-CM | POA: Diagnosis not present

## 2021-09-13 DIAGNOSIS — E119 Type 2 diabetes mellitus without complications: Secondary | ICD-10-CM | POA: Insufficient documentation

## 2021-09-13 DIAGNOSIS — K863 Pseudocyst of pancreas: Secondary | ICD-10-CM | POA: Diagnosis not present

## 2021-09-13 DIAGNOSIS — I700 Atherosclerosis of aorta: Secondary | ICD-10-CM | POA: Diagnosis not present

## 2021-09-13 DIAGNOSIS — I714 Abdominal aortic aneurysm, without rupture, unspecified: Secondary | ICD-10-CM | POA: Diagnosis not present

## 2021-09-13 LAB — POCT I-STAT CREATININE: Creatinine, Ser: 1.3 mg/dL — ABNORMAL HIGH (ref 0.44–1.00)

## 2021-09-13 MED ORDER — SODIUM CHLORIDE (PF) 0.9 % IJ SOLN
INTRAMUSCULAR | Status: AC
  Filled 2021-09-13: qty 50

## 2021-09-13 MED ORDER — IOHEXOL 300 MG/ML  SOLN
80.0000 mL | Freq: Once | INTRAMUSCULAR | Status: AC | PRN
  Administered 2021-09-13: 80 mL via INTRAVENOUS

## 2021-09-15 ENCOUNTER — Encounter: Payer: Self-pay | Admitting: Gastroenterology

## 2021-09-15 ENCOUNTER — Ambulatory Visit: Payer: Medicare Other | Admitting: Gastroenterology

## 2021-09-15 VITALS — BP 150/88 | HR 71 | Ht 66.5 in | Wt 119.2 lb

## 2021-09-15 DIAGNOSIS — Z1211 Encounter for screening for malignant neoplasm of colon: Secondary | ICD-10-CM | POA: Diagnosis not present

## 2021-09-15 DIAGNOSIS — K862 Cyst of pancreas: Secondary | ICD-10-CM | POA: Diagnosis not present

## 2021-09-15 DIAGNOSIS — Z800 Family history of malignant neoplasm of digestive organs: Secondary | ICD-10-CM

## 2021-09-15 DIAGNOSIS — Z8719 Personal history of other diseases of the digestive system: Secondary | ICD-10-CM

## 2021-09-15 DIAGNOSIS — R933 Abnormal findings on diagnostic imaging of other parts of digestive tract: Secondary | ICD-10-CM | POA: Diagnosis not present

## 2021-09-15 NOTE — Progress Notes (Unsigned)
[place] VISIT   Primary Care Provider Jearld Fenton, NP [place] [address] [phone]   Patient Profile: Holly Navarro is a 76 y.o. female with a pmh significant for hypertension, hyperlipidemia anxiety, arthritis peptic ulcer disease, seizures, AAA, insulin-dependent diabetes (secondary to severe pancreatitis), family history colon cancer (son), family history of pancreas cancer (brother), idiopathic pancreatitis in 6553 (complicated pancreatic pseudocyst with walled off necrosis, splenic vein thrombosis-now decompressed and prior necrosectomy but now with recurrent pseudocyst).  The patient presents to the [facility] for an evaluation and management of problem(s) noted below:  Problem List 1. Pancreatic cyst   2. Abnormal CT scan, pancreas   3. History of pancreatitis   4. Colon cancer screening   5. Family history of colon cancer     History of Present Illness Please see prior notes for full details of HPI.  Interval History The patient returns for follow-up.  Since our last visit, the patient has undergone a pancreatic pseudocyst gastrostomy with necrosectomy at [facility] and then further necrosectomy and stent removal by me earlier this summer.  She did well post procedure.  She enjoyed her summer vacation in the [place].  She underwent a follow-up CAT scan with findings as below.  She is feeling and doing well.  She will have infrequent episodes upon certain positions that she is in where she will feel a slight discomfort in her left flank/left back area.  Her weight has stabilized.  She is not experiencing changes in her bowel habits otherwise.  She has not noted any blood in her stools.  She knows that she is overdue for colon cancer screening but is not sure that she wants to undergo colonoscopy even though she had a son with early colon cancer.    GI Review of Systems Positive as above Negative for dysphagia,  odynophagia, alteration of bowel habits, melena, hematochezia   Review of Systems General: Denies fevers/chills/unintentional weight loss  Cardiovascular: Denies chest pain/palpitations Pulmonary: Denies shortness of breath Gastroenterological: See HPI Genitourinary: Denies darkened urine Hematological: Denies easy bruising/bleeding Dermatological: Denies jaundice Psychological: Mood is stable   Medications Current Outpatient Medications  Medication Sig Dispense Refill   atenolol (TENORMIN) 50 MG tablet Take 1 tablet (50 mg total) by mouth daily. 90 tablet 1   blood glucose meter kit and supplies KIT Dispense based on patient and insurance preference. Use up to four times daily as directed. 1 each 0   cimetidine (TAGAMET) 200 MG tablet Take 200 mg by mouth 2 (two) times daily as needed (indigestion/heartburn.).     Coenzyme Q10 (COQ10) 100 MG CAPS Take 100 mg by mouth in the morning.     diphenhydrAMINE (BENADRYL) 25 mg capsule Take 25 mg by mouth every 6 (six) hours as needed.     ECHINACEA PO Take 500 mg by mouth in the morning.     fluticasone (FLONASE) 50 MCG/ACT nasal spray Place 2 sprays into both nostrils daily as needed. (Patient taking differently: Place 2 sprays into both nostrils daily.) 16 g 1   Insulin Pen Needle (NOVOFINE PEN NEEDLE) 32G X 6 MM MISC Use to inject insulin daily as directed. 90 each 1   lipase/protease/amylase (CREON) 36000 UNITS CPEP capsule Take 2 capsules (72,000 Units total) by mouth 3 (three) times daily with meals. May also take 1 capsule (36,000 Units total) as needed (with snacks). 720 capsule 2   lisinopril (ZESTRIL) 20 MG tablet Take 1 tablet (20 mg  total) by mouth daily. 90 tablet 0   MILK THISTLE PO Take 1 tablet by mouth daily.     Multiple Vitamins-Minerals (MULTIVITAMIN ADULTS 50+) TABS Take 1 tablet by mouth daily in the afternoon.     pantoprazole (PROTONIX) 40 MG tablet Take 1 tablet (40 mg total) by mouth 2 (two) times daily before a meal.  60 tablet 6   Polyethyl Glycol-Propyl Glycol (LUBRICANT EYE DROPS) 0.4-0.3 % SOLN Place 1 drop into both eyes in the morning and at bedtime.     Red Yeast Rice 600 MG CAPS Take 600 mg by mouth 2 (two) times daily.     simethicone (MYLICON) 161 MG chewable tablet Chew 125 mg by mouth in the morning, at noon, in the evening, and at bedtime.     TRESIBA FLEXTOUCH 100 UNIT/ML FlexTouch Pen Inject 15 Units into the skin daily. Adjust dose as advised. 15 mL 0   DANDELION ROOT PO Take by mouth daily as needed. tea     No current facility-administered medications for this visit.    Allergies Allergies  Allergen Reactions   Fentanyl Other (See Comments)    Patient preference, not an allergy    Morphine And Related Other (See Comments)    Patient preference, not an allergy      Histories Past Medical History:  Diagnosis Date   Anemia    Anxiety    Arthritis    Cardiac arrhythmia    Diabetes mellitus without complication ([place])    Hyperlipidemia    Hypertension    Pancreatic pseudocyst    Seizure ([place])    Stomach ulcer    Past Surgical History:  Procedure Laterality Date   BIOPSY  05/20/2020   Procedure: BIOPSY;  Surgeon: Irving Copas., MD;  [facility];  Service: Gastroenterology;;   BIOPSY  02/25/2021   Procedure: BIOPSY;  Surgeon: Irving Copas., MD;  Location: [place];  Service: Gastroenterology;;   BIOPSY  06/24/2021   Procedure: BIOPSY;  Surgeon: Irving Copas., MD;  Location: [place];  Service: Gastroenterology;;   CYST GASTROSTOMY  06/24/2021   Procedure: NECROSECTOMY;  Surgeon: Irving Copas., MD;  Location: [place];  Service: Gastroenterology;;   ESOPHAGOGASTRODUODENOSCOPY N/A 05/20/2020   Procedure: ESOPHAGOGASTRODUODENOSCOPY (EGD);  Surgeon: Irving Copas., MD;  Location: [facility];  Service: Gastroenterology;  Laterality: N/A;   ESOPHAGOGASTRODUODENOSCOPY (EGD) WITH PROPOFOL N/A 02/25/2021    Procedure: ESOPHAGOGASTRODUODENOSCOPY (EGD) WITH PROPOFOL;  Surgeon: Rush Landmark Telford Nab., MD;  Location: [place];  Service: Gastroenterology;  Laterality: N/A;   ESOPHAGOGASTRODUODENOSCOPY (EGD) WITH PROPOFOL N/A 06/24/2021   Procedure: ESOPHAGOGASTRODUODENOSCOPY (EGD) WITH PROPOFOL;  Surgeon: Rush Landmark Telford Nab., MD;  Location: [place];  Service: Gastroenterology;  Laterality: N/A;   FOREIGN BODY REMOVAL  06/24/2021   Procedure: FOREIGN BODY REMOVAL;  Surgeon: Rush Landmark Telford Nab., MD;  Location: [place];  Service: Gastroenterology;;   [place] REMOVAL  06/24/2021   Procedure: STENT REMOVAL;  Surgeon: Irving Copas., MD;  Location: [place];  Service: Gastroenterology;;   TONSILLECTOMY     UPPER ESOPHAGEAL ENDOSCOPIC ULTRASOUND (EUS) N/A 05/20/2020   Procedure: UPPER ESOPHAGEAL ENDOSCOPIC ULTRASOUND (EUS)  ;  Surgeon: Irving Copas., MD;  [facility];  Service: Gastroenterology;  Laterality: N/A;   UPPER ESOPHAGEAL ENDOSCOPIC ULTRASOUND (EUS) N/A 02/25/2021   Procedure: UPPER ESOPHAGEAL ENDOSCOPIC ULTRASOUND (EUS);  Surgeon: Irving Copas., MD;  Location: [place];  Service: Gastroenterology;  Laterality: N/A;   Social History  Socioeconomic History   Marital status: Widowed    Spouse name: Not on file   Number of children: 4   Years of education: Not on file   Highest education level: Not on file  Occupational History   Occupation: retired  Tobacco Use   Smoking status: Every Day    Packs/day: 0.25    Years: 60.00    Total pack years: 15.00    Types: Cigarettes   Smokeless tobacco: Never   Tobacco comments:    0.25 a pack a day  Vaping Use   Vaping Use: Never used  Substance and Sexual Activity   Alcohol use: Not Currently   Drug use: No   Sexual activity: Not on file  Other Topics Concern   Not on file  Social History Narrative   Not on file   Social Determinants of  Health   Financial Resource Strain: Not on file  Food Insecurity: Not on file  Transportation Needs: Not on file  Physical Activity: Not on file  Stress: Not on file  Social Connections: Not on file  Intimate Partner Violence: Not on file   Family History  Problem Relation Age of Onset   Heart disease Mother    Kidney disease Mother    Diabetes Father    Pancreatic cancer Brother        Agent Orange   Gallstones Daughter    Colon cancer Son 73   Diabetes Paternal Uncle        x 2   Esophageal cancer Neg Hx    Inflammatory bowel disease Neg Hx    Liver disease Neg Hx    Stomach cancer Neg Hx    I have reviewed her medical, social, and family history in detail and updated the electronic medical record as necessary.    PHYSICAL EXAMINATION  BP (!) 150/88   Pulse 71   Ht 5' 6.5" (1.689 m)   Wt 119 lb 4 oz (54.1 kg)   BMI 18.96 kg/m  GEN: NAD, appears stated age, doesn't appear chronically ill PSYCH: Cooperative, without pressured speech EYE: Conjunctivae pink, sclerae anicteric ENT: MMM CV: Nontachycardic RESP: No audible wheezing GI: NABS, soft, NT/ND, without rebound MSK/EXT: No lower extremity edema SKIN: No jaundice NEURO:  Alert & Oriented x 3, no focal deficits   REVIEW OF DATA  I reviewed the following data at the time of this encounter:  GI Procedures and Studies  June 2023 EGD - No gross lesions in esophagus. Non-obstructing Schatzki ring. Z-line regular, 38 cm from the incisors. - 2 cm hiatal hernia. - Non-bleeding gastric ulcers with a clean ulcer base ([place] Class III). Gastritis. Biopsied. - Pre-existing AXIOS cystgastrostomy and double pigtail stents in placed. - Necrosectomy was performed. - No gross lesions in the duodenal bulb, in the first portion of the duodenum and in the second portion of the duodenum. Normal ampulla.  Laboratory Studies  Reviewed those in epic and care everywhere  Imaging Studies  September 2023 CT  abdomen IMPRESSION: 1. Interval growth of complex 5.5 x 2.9 cm cystic lesion in the pancreatic body, compatible with increasing pancreatic pseudocyst, with interval removal of the cystogastrostomy tube. 2. Infrarenal 3.2 cm abdominal aortic aneurysm. Recommend follow-up every 3 years. Reference: J Am Coll Radiol 5597;41:638-453. 3. Aortic Atherosclerosis (ICD10-I70.0) and Emphysema (ICD10-J43.9).   ASSESSMENT  Ms. Giesler is a 76 y.o. 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  The patient is seen today for evaluation and management of:  1. Pancreatic cyst   2. Abnormal CT scan, pancreas   3. History of pancreatitis   4. Colon cancer screening   5. Family history of colon cancer    The patient is hemodynamically and clinically stable at this time.  The most recent cross-sectional imaging is concerning to me with potential recurrence of the pancreatic pseudocyst.  Certainly is not the size that it was before drainage and necrosectomy.  With this being said, his recurrence in the short amount of time does concern me as to whether a pancreatic duct disruption could be a reason for its recurrence.  Time will tell.  We will plan to repeat imaging in approximately 6 weeks to see where things are since she is doing well otherwise.  We will plan an MRCP as her next imaging study.  If she does have evidence of progressive pseudocyst formation, then we will need to consider potential repeat attempt at pseudocyst gastrostomy as well as pancreatic ERCP to try and evaluate the duct.  This carries increased  risks via ERCP.  The risks of an ERCP were discussed at length, including but not limited to the risk of perforation, bleeding, abdominal pain, post-ERCP pancreatitis (while usually mild can be severe and even life threatening).  Hopefully will not need to do that.  As well, the patient is overdue for colon cancer screening.  She would be an appropriate candidate for colonoscopy but she is very hesitant about this.  Although Cologuard is not normally the test I would treat her personal has a family history of colon cancer, if she is not willing at this time to undergo a colonoscopy but may consider that if Cologuard were to return positive I think Cologuard testing gives [place] our best chance of getting her up-to-date from a screening standpoint.  We will move forward with placing the order for Cologuard.  She understands that Cologuard in patients were not average risk individuals is not as well verified that colonoscopy is the gold standard but she still does not want to undergo colonoscopy upfront.  All patient questions were answered to the best of my ability, and the patient agrees to the aforementioned plan of action with follow-up as indicated.   PLAN  Continue Creon 72,000 units with each meal and 36,000 units with each snack Proceed with MRI/MRCP in 6 weeks to reevaluate pancreatic cyst -Hopefully pseudocyst does not continue to progress where we will need to consider repeat pseudocyst gastrostomy/pancreatic ERCP Continue to decrease all tobacco use Proceed with colon cancer screening via Cologuard testing   Orders Placed This Encounter  Procedures   MR ABDOMEN MRCP W WO CONTAST   Cologuard   Basic metabolic panel    New Prescriptions   No medications on file   Modified Medications   No medications on file    Planned Follow Up No follow-ups on file.   Total Time in Face-to-Face and in Coordination of Care for patient including independent/personal interpretation/review of prior  testing, medical history, examination, medication adjustment, review of imaging, communicating results with the patient directly, and documentation with the EHR is 25 minutes.   Justice Britain, MD [facility] Office # [phone]

## 2021-09-15 NOTE — Patient Instructions (Addendum)
_______________________________________________________  If you are age 76 or older, your body mass index should be between 23-30. Your Body mass index is 18.96 kg/m. If this is out of the aforementioned range listed, please consider follow up with your Primary Care Provider.  If you are age 69 or younger, your body mass index should be between 19-25. Your Body mass index is 18.96 kg/m. If this is out of the aformentioned range listed, please consider follow up with your Primary Care Provider.   ________________________________________________________  The [place] [facility] providers would like to encourage you to use [facility] to communicate with providers for non-urgent requests or questions.  Due to long hold times on the telephone, sending your provider a message by [facility] may be a faster and more efficient way to get a response.  Please allow 48 business hours for a response.  Please remember that this is for non-urgent requests.  _______________________________________________________  Please have a lab drawn a week or two before your MRI.   You have been scheduled [facility]. Your appointment time is 9:00am. Please arrive to admitting (at main entrance of the hospital) 15 minutes prior to your appointment time for registration purposes. Please make certain not to have anything to eat or drink 6 hours prior to your test. In addition, if you have any metal in your body, have a pacemaker or defibrillator, please be sure to let your ordering physician know. This test typically takes 45 minutes to 1 hour to complete. Should you need to reschedule, please call [phone] to do so.  Your provider has ordered Cologuard testing as an option for colon cancer screening. This is performed by [employer] and may be out of network with your insurance. PRIOR to completing the test, it is YOUR responsibility to contact your insurance about covered  benefits for this test. Your out of pocket expense could be anywhere from $0.00 to $649.00.   When you call to check coverage with your insurer, please provide the following information:   -The ONLY provider of Cologuard is Opt[facility], dispensing  - CPT code for Cologuard is [phone].  [employer] NPI # [id number]  -[employer] Tax ID # [id number]   We have already sent your demographic and insurance information to [employer] (phone number [phone]) and they should contact you within the next week regarding your test. If you have not heard from them within the next week, please call our office at [phone].

## 2021-09-18 ENCOUNTER — Encounter: Payer: Self-pay | Admitting: Gastroenterology

## 2021-09-18 DIAGNOSIS — R933 Abnormal findings on diagnostic imaging of other parts of digestive tract: Secondary | ICD-10-CM | POA: Insufficient documentation

## 2021-10-06 DIAGNOSIS — Z1211 Encounter for screening for malignant neoplasm of colon: Secondary | ICD-10-CM | POA: Diagnosis not present

## 2021-10-09 ENCOUNTER — Other Ambulatory Visit: Payer: Self-pay | Admitting: Internal Medicine

## 2021-10-09 DIAGNOSIS — Z8719 Personal history of other diseases of the digestive system: Secondary | ICD-10-CM

## 2021-10-09 DIAGNOSIS — R739 Hyperglycemia, unspecified: Secondary | ICD-10-CM

## 2021-10-11 NOTE — Telephone Encounter (Signed)
Requested Prescriptions  Pending Prescriptions Disp Refills  . TRESIBA FLEXTOUCH 100 UNIT/ML FlexTouch Pen [Pharmacy Med Name: Tyler Aas FlexTouch 100 UNIT/ML Subcutaneous Solution Pen-injector] 15 mL 0    Sig: INJECT 15 UNITS INTO THE SKIN DAILY. ADJUST DOSE AS ADVISED     Endocrinology:  Diabetes - Insulins Failed - 10/09/2021  1:41 PM      Failed - HBA1C is between 0 and 7.9 and within 180 days    Hgb A1c MFr Bld  Date Value Ref Range Status  04/22/2021 10.2 (H) <5.7 % of total Hgb Final    Comment:    For someone without known diabetes, a hemoglobin A1c value of 6.5% or greater indicates that they may have  diabetes and this should be confirmed with a follow-up  test. . For someone with known diabetes, a value <7% indicates  that their diabetes is well controlled and a value  greater than or equal to 7% indicates suboptimal  control. A1c targets should be individualized based on  duration of diabetes, age, comorbid conditions, and  other considerations. . Currently, no consensus exists regarding use of hemoglobin A1c for diagnosis of diabetes for children. Renella Cunas - Valid encounter within last 6 months    Recent Outpatient Visits          5 months ago Aortic atherosclerosis [facility])   [facility] [place], Coralie Keens, NP   10 months ago Medicare annual wellness visit, subsequent   [facility] [place], Coralie Keens, NP   1 year ago Acute on chronic pancreatitis [facility])   [facility] [place], Coralie Keens, NP   1 year ago Gastroesophageal reflux disease, unspecified whether esophagitis present   [facility] [place], Coralie Keens, NP   1 year ago Hyperglycemia   [place], DO      Future Appointments            In 2 weeks Garnette Gunner, Coralie Keens, NP [facility], [facility]

## 2021-10-13 LAB — COLOGUARD: COLOGUARD: NEGATIVE

## 2021-10-19 ENCOUNTER — Other Ambulatory Visit
Admission: RE | Admit: 2021-10-19 | Discharge: 2021-10-19 | Disposition: A | Discharge status: Home / Self Care | Payer: Medicare Other | Attending: Gastroenterology | Admitting: Gastroenterology

## 2021-10-19 DIAGNOSIS — Z1211 Encounter for screening for malignant neoplasm of colon: Secondary | ICD-10-CM | POA: Diagnosis not present

## 2021-10-19 DIAGNOSIS — K862 Cyst of pancreas: Secondary | ICD-10-CM | POA: Diagnosis not present

## 2021-10-19 DIAGNOSIS — Z800 Family history of malignant neoplasm of digestive organs: Secondary | ICD-10-CM | POA: Insufficient documentation

## 2021-10-19 LAB — BASIC METABOLIC PANEL
Anion gap: 10 (ref 5–15)
BUN: 21 mg/dL (ref 8–23)
CO2: 27 mmol/L (ref 22–32)
Calcium: 10 mg/dL (ref 8.9–10.3)
Chloride: 101 mmol/L (ref 98–111)
Creatinine, Ser: 1.18 mg/dL — ABNORMAL HIGH (ref 0.44–1.00)
GFR, Estimated: 48 mL/min — ABNORMAL LOW (ref >60)
Glucose, Bld: 189 mg/dL — ABNORMAL HIGH (ref 70–99)
Potassium: 4.3 mmol/L (ref 3.5–5.1)
Sodium: 138 mmol/L (ref 135–145)

## 2021-10-26 ENCOUNTER — Encounter: Payer: Self-pay | Admitting: Gastroenterology

## 2021-10-26 ENCOUNTER — Ambulatory Visit (INDEPENDENT_AMBULATORY_CARE_PROVIDER_SITE_OTHER): Payer: Medicare Other | Admitting: Internal Medicine

## 2021-10-26 ENCOUNTER — Encounter: Payer: Self-pay | Admitting: Internal Medicine

## 2021-10-26 VITALS — BP 138/82 | HR 65 | Temp 97.7°F | Ht 66.5 in | Wt 121.0 lb

## 2021-10-26 DIAGNOSIS — Z23 Encounter for immunization: Secondary | ICD-10-CM | POA: Diagnosis not present

## 2021-10-26 DIAGNOSIS — E119 Type 2 diabetes mellitus without complications: Secondary | ICD-10-CM

## 2021-10-26 DIAGNOSIS — Z0000 Encounter for general adult medical examination without abnormal findings: Secondary | ICD-10-CM

## 2021-10-26 DIAGNOSIS — Z794 Long term (current) use of insulin: Secondary | ICD-10-CM | POA: Diagnosis not present

## 2021-10-26 MED ORDER — ATENOLOL 50 MG PO TABS: 50.0000 mg | ORAL_TABLET | Freq: Every day | ORAL | 0 refills | Status: DC

## 2021-10-26 MED ORDER — LISINOPRIL 20 MG PO TABS: 20.0000 mg | ORAL_TABLET | Freq: Every day | ORAL | 0 refills | Status: DC

## 2021-10-26 NOTE — Patient Instructions (Signed)
Health Maintenance for Postmenopausal Women Menopause is a normal process in which your ability to get pregnant comes to an end. This process happens slowly over many months or years, usually between the ages of 48 and 55. Menopause is complete when you have missed your menstrual period for 12 months. It is important to talk with your health care provider about some of the most common conditions that affect women after menopause (postmenopausal women). These include heart disease, cancer, and bone loss (osteoporosis). Adopting a healthy lifestyle and getting preventive care can help to promote your health and wellness. The actions you take can also lower your chances of developing some of these common conditions. What are the signs and symptoms of menopause? During menopause, you may have the following symptoms: Hot flashes. These can be moderate or severe. Night sweats. Decrease in sex drive. Mood swings. Headaches. Tiredness (fatigue). Irritability. Memory problems. Problems falling asleep or staying asleep. Talk with your health care provider about treatment options for your symptoms. Do I need hormone replacement therapy? Hormone replacement therapy is effective in treating symptoms that are caused by menopause, such as hot flashes and night sweats. Hormone replacement carries certain risks, especially as you become older. If you are thinking about using estrogen or estrogen with progestin, discuss the benefits and risks with your health care provider. How can I reduce my risk for heart disease and stroke? The risk of heart disease, heart attack, and stroke increases as you age. One of the causes may be a change in the body's hormones during menopause. This can affect how your body uses dietary fats, triglycerides, and cholesterol. Heart attack and stroke are medical emergencies. There are many things that you can do to help prevent heart disease and stroke. Watch your blood pressure High  blood pressure causes heart disease and increases the risk of stroke. This is more likely to develop in people who have high blood pressure readings or are overweight. Have your blood pressure checked: Every 3-5 years if you are 18-39 years of age. Every year if you are 40 years old or older. Eat a healthy diet  Eat a diet that includes plenty of vegetables, fruits, low-fat dairy products, and lean protein. Do not eat a lot of foods that are high in solid fats, added sugars, or sodium. Get regular exercise Get regular exercise. This is one of the most important things you can do for your health. Most adults should: Try to exercise for at least 150 minutes each week. The exercise should increase your heart rate and make you sweat (moderate-intensity exercise). Try to do strengthening exercises at least twice each week. Do these in addition to the moderate-intensity exercise. Spend less time sitting. Even light physical activity can be beneficial. Other tips Work with your health care provider to achieve or maintain a healthy weight. Do not use any products that contain nicotine or tobacco. These products include cigarettes, chewing tobacco, and vaping devices, such as e-cigarettes. If you need help quitting, ask your health care provider. Know your numbers. Ask your health care provider to check your cholesterol and your blood sugar (glucose). Continue to have your blood tested as directed by your health care provider. Do I need screening for cancer? Depending on your health history and family history, you may need to have cancer screenings at different stages of your life. This may include screening for: Breast cancer. Cervical cancer. Lung cancer. Colorectal cancer. What is my risk for osteoporosis? After menopause, you may be   at increased risk for osteoporosis. Osteoporosis is a condition in which bone destruction happens more quickly than new bone creation. To help prevent osteoporosis or  the bone fractures that can happen because of osteoporosis, you may take the following actions: If you are 19-50 years old, get at least 1,000 mg of calcium and at least 600 international units (IU) of vitamin D per day. If you are older than age 50 but younger than age 70, get at least 1,200 mg of calcium and at least 600 international units (IU) of vitamin D per day. If you are older than age 70, get at least 1,200 mg of calcium and at least 800 international units (IU) of vitamin D per day. Smoking and drinking excessive alcohol increase the risk of osteoporosis. Eat foods that are rich in calcium and vitamin D, and do weight-bearing exercises several times each week as directed by your health care provider. How does menopause affect my mental health? Depression may occur at any age, but it is more common as you become older. Common symptoms of depression include: Feeling depressed. Changes in sleep patterns. Changes in appetite or eating patterns. Feeling an overall lack of motivation or enjoyment of activities that you previously enjoyed. Frequent crying spells. Talk with your health care provider if you think that you are experiencing any of these symptoms. General instructions See your health care provider for regular wellness exams and vaccines. This may include: Scheduling regular health, dental, and eye exams. Getting and maintaining your vaccines. These include: Influenza vaccine. Get this vaccine each year before the flu season begins. Pneumonia vaccine. Shingles vaccine. Tetanus, diphtheria, and pertussis (Tdap) booster vaccine. Your health care provider may also recommend other immunizations. Tell your health care provider if you have ever been abused or do not feel safe at home. Summary Menopause is a normal process in which your ability to get pregnant comes to an end. This condition causes hot flashes, night sweats, decreased interest in sex, mood swings, headaches, or lack  of sleep. Treatment for this condition may include hormone replacement therapy. Take actions to keep yourself healthy, including exercising regularly, eating a healthy diet, watching your weight, and checking your blood pressure and blood sugar levels. Get screened for cancer and depression. Make sure that you are up to date with all your vaccines. This information is not intended to replace advice given to you by your health care provider. Make sure you discuss any questions you have with your health care provider. Document Revised: 05/11/2020 Document Reviewed: 05/11/2020 Elsevier Patient Education  2023 Elsevier Inc.  

## 2021-10-26 NOTE — Progress Notes (Signed)
Subjective:    Patient ID: Holly Navarro, female    DOB: [birth year], 76 y.o.   MRN: [id number]  HPI  Patient presents to clinic today for her annual exam.  Flu: 12/2020 Tetanus: unsure COVID: Pfizer x4 Pneumovax: never Prevnar: never Shingrix: never Pap smear: no longer screening Mammogram: no longer screening Bone density: never Colon screening: 10/2021, Cologuard Vision screening: annually Dentist: dentures  Diet: She does eat meat. She consumes fruits and veggies. She tries to avoid fried foods. She drinks mostly coffee, water, tea and juice. Exercise: Walking  Review of Systems  Past Medical History:  Diagnosis Date   Anemia    Anxiety    Arthritis    Cardiac arrhythmia    Diabetes mellitus without complication (HCC)    Hyperlipidemia    Hypertension    Pancreatic pseudocyst    Seizure (HCC)    Stomach ulcer     Current Outpatient Medications  Medication Sig Dispense Refill   atenolol (TENORMIN) 50 MG tablet Take 1 tablet (50 mg total) by mouth daily. 90 tablet 1   blood glucose meter kit and supplies KIT Dispense based on patient and insurance preference. Use up to four times daily as directed. 1 each 0   cimetidine (TAGAMET) 200 MG tablet Take 200 mg by mouth 2 (two) times daily as needed (indigestion/heartburn.).     Coenzyme Q10 (COQ10) 100 MG CAPS Take 100 mg by mouth in the morning.     diphenhydrAMINE (BENADRYL) 25 mg capsule Take 25 mg by mouth every 6 (six) hours as needed.     ECHINACEA PO Take 500 mg by mouth in the morning.     fluticasone (FLONASE) 50 MCG/ACT nasal spray Place 2 sprays into both nostrils daily as needed. (Patient taking differently: Place 2 sprays into both nostrils daily.) 16 g 1   Insulin Pen Needle (NOVOFINE PEN NEEDLE) 32G X 6 MM MISC Use to inject insulin daily as directed. 90 each 1   lipase/protease/amylase (CREON) 36000 UNITS CPEP capsule Take 2 capsules (72,000 Units total) by mouth 3 (three) times daily with meals. May  also take 1 capsule (36,000 Units total) as needed (with snacks). 720 capsule 2   lisinopril (ZESTRIL) 20 MG tablet Take 1 tablet (20 mg total) by mouth daily. 90 tablet 0   MILK THISTLE PO Take 1 tablet by mouth daily.     Multiple Vitamins-Minerals (MULTIVITAMIN ADULTS 50+) TABS Take 1 tablet by mouth daily in the afternoon.     pantoprazole (PROTONIX) 40 MG tablet Take 1 tablet (40 mg total) by mouth 2 (two) times daily before a meal. 60 tablet 6   Polyethyl Glycol-Propyl Glycol (LUBRICANT EYE DROPS) 0.4-0.3 % SOLN Place 1 drop into both eyes in the morning and at bedtime.     Red Yeast Rice 600 MG CAPS Take 600 mg by mouth 2 (two) times daily.     simethicone (MYLICON) 947 MG chewable tablet Chew 125 mg by mouth in the morning, at noon, in the evening, and at bedtime.     TRESIBA FLEXTOUCH 100 UNIT/ML FlexTouch Pen INJECT 15 UNITS INTO THE SKIN DAILY. ADJUST DOSE AS ADVISED 15 mL 0   No current facility-administered medications for this visit.    Allergies  Allergen Reactions   Fentanyl Other (See Comments)    Patient preference, not an allergy    Morphine And Related Other (See Comments)    Patient preference, not an allergy      Family History  Problem   Subjective:    Patient ID: Holly Navarro, female    DOB: 12/10/1945, 76 y.o.   MRN: [id number]  HPI  Patient presents to clinic today for her annual exam.  Flu: 12/2020 Tetanus: unsure COVID: Pfizer x4 Pneumovax: never Prevnar: never Shingrix: never Pap smear: no longer screening Mammogram: no longer screening Bone density: never Colon screening: 10/2021, Cologuard Vision screening: annually Dentist: dentures  Diet: She does eat meat. She consumes fruits and veggies. She tries to avoid fried foods. She drinks mostly coffee, water, tea and juice. Exercise: Walking  Review of Systems  Past Medical History:  Diagnosis Date   Anemia    Anxiety    Arthritis    Cardiac arrhythmia    Diabetes mellitus without complication (HCC)    Hyperlipidemia    Hypertension    Pancreatic pseudocyst    Seizure (HCC)    Stomach ulcer     Current Outpatient Medications  Medication Sig Dispense Refill   atenolol (TENORMIN) 50 MG tablet Take 1 tablet (50 mg total) by mouth daily. 90 tablet 1   blood glucose meter kit and supplies KIT Dispense based on patient and insurance preference. Use up to four times daily as directed. 1 each 0   cimetidine (TAGAMET) 200 MG tablet Take 200 mg by mouth 2 (two) times daily as needed (indigestion/heartburn.).     Coenzyme Q10 (COQ10) 100 MG CAPS Take 100 mg by mouth in the morning.     diphenhydrAMINE (BENADRYL) 25 mg capsule Take 25 mg by mouth every 6 (six) hours as needed.     ECHINACEA PO Take 500 mg by mouth in the morning.     fluticasone (FLONASE) 50 MCG/ACT nasal spray Place 2 sprays into both nostrils daily as needed. (Patient taking differently: Place 2 sprays into both nostrils daily.) 16 g 1   Insulin Pen Needle (NOVOFINE PEN NEEDLE) 32G X 6 MM MISC Use to inject insulin daily as directed. 90 each 1   lipase/protease/amylase (CREON) 36000 UNITS CPEP capsule Take 2 capsules (72,000 Units total) by mouth 3 (three) times daily with meals. May  also take 1 capsule (36,000 Units total) as needed (with snacks). 720 capsule 2   lisinopril (ZESTRIL) 20 MG tablet Take 1 tablet (20 mg total) by mouth daily. 90 tablet 0   MILK THISTLE PO Take 1 tablet by mouth daily.     Multiple Vitamins-Minerals (MULTIVITAMIN ADULTS 50+) TABS Take 1 tablet by mouth daily in the afternoon.     pantoprazole (PROTONIX) 40 MG tablet Take 1 tablet (40 mg total) by mouth 2 (two) times daily before a meal. 60 tablet 6   Polyethyl Glycol-Propyl Glycol (LUBRICANT EYE DROPS) 0.4-0.3 % SOLN Place 1 drop into both eyes in the morning and at bedtime.     Red Yeast Rice 600 MG CAPS Take 600 mg by mouth 2 (two) times daily.     simethicone (MYLICON) 125 MG chewable tablet Chew 125 mg by mouth in the morning, at noon, in the evening, and at bedtime.     TRESIBA FLEXTOUCH 100 UNIT/ML FlexTouch Pen INJECT 15 UNITS INTO THE SKIN DAILY. ADJUST DOSE AS ADVISED 15 mL 0   No current facility-administered medications for this visit.    Allergies  Allergen Reactions   Fentanyl Other (See Comments)    Patient preference, not an allergy    Morphine And Related Other (See Comments)    Patient preference, not an allergy      Family History  Problem    Subjective:    Patient ID: Holly Navarro, female    DOB: 12/10/1945, 76 y.o.   MRN: [id number]  HPI  Patient presents to clinic today for her annual exam.  Flu: 12/2020 Tetanus: unsure COVID: Pfizer x4 Pneumovax: never Prevnar: never Shingrix: never Pap smear: no longer screening Mammogram: no longer screening Bone density: never Colon screening: 10/2021, Cologuard Vision screening: annually Dentist: dentures  Diet: She does eat meat. She consumes fruits and veggies. She tries to avoid fried foods. She drinks mostly coffee, water, tea and juice. Exercise: Walking  Review of Systems  Past Medical History:  Diagnosis Date   Anemia    Anxiety    Arthritis    Cardiac arrhythmia    Diabetes mellitus without complication (HCC)    Hyperlipidemia    Hypertension    Pancreatic pseudocyst    Seizure (HCC)    Stomach ulcer     Current Outpatient Medications  Medication Sig Dispense Refill   atenolol (TENORMIN) 50 MG tablet Take 1 tablet (50 mg total) by mouth daily. 90 tablet 1   blood glucose meter kit and supplies KIT Dispense based on patient and insurance preference. Use up to four times daily as directed. 1 each 0   cimetidine (TAGAMET) 200 MG tablet Take 200 mg by mouth 2 (two) times daily as needed (indigestion/heartburn.).     Coenzyme Q10 (COQ10) 100 MG CAPS Take 100 mg by mouth in the morning.     diphenhydrAMINE (BENADRYL) 25 mg capsule Take 25 mg by mouth every 6 (six) hours as needed.     ECHINACEA PO Take 500 mg by mouth in the morning.     fluticasone (FLONASE) 50 MCG/ACT nasal spray Place 2 sprays into both nostrils daily as needed. (Patient taking differently: Place 2 sprays into both nostrils daily.) 16 g 1   Insulin Pen Needle (NOVOFINE PEN NEEDLE) 32G X 6 MM MISC Use to inject insulin daily as directed. 90 each 1   lipase/protease/amylase (CREON) 36000 UNITS CPEP capsule Take 2 capsules (72,000 Units total) by mouth 3 (three) times daily with meals. May  also take 1 capsule (36,000 Units total) as needed (with snacks). 720 capsule 2   lisinopril (ZESTRIL) 20 MG tablet Take 1 tablet (20 mg total) by mouth daily. 90 tablet 0   MILK THISTLE PO Take 1 tablet by mouth daily.     Multiple Vitamins-Minerals (MULTIVITAMIN ADULTS 50+) TABS Take 1 tablet by mouth daily in the afternoon.     pantoprazole (PROTONIX) 40 MG tablet Take 1 tablet (40 mg total) by mouth 2 (two) times daily before a meal. 60 tablet 6   Polyethyl Glycol-Propyl Glycol (LUBRICANT EYE DROPS) 0.4-0.3 % SOLN Place 1 drop into both eyes in the morning and at bedtime.     Red Yeast Rice 600 MG CAPS Take 600 mg by mouth 2 (two) times daily.     simethicone (MYLICON) 125 MG chewable tablet Chew 125 mg by mouth in the morning, at noon, in the evening, and at bedtime.     TRESIBA FLEXTOUCH 100 UNIT/ML FlexTouch Pen INJECT 15 UNITS INTO THE SKIN DAILY. ADJUST DOSE AS ADVISED 15 mL 0   No current facility-administered medications for this visit.    Allergies  Allergen Reactions   Fentanyl Other (See Comments)    Patient preference, not an allergy    Morphine And Related Other (See Comments)    Patient preference, not an allergy      Family History  Problem

## 2021-10-27 ENCOUNTER — Other Ambulatory Visit: Payer: Self-pay | Admitting: Gastroenterology

## 2021-10-27 ENCOUNTER — Ambulatory Visit
Admission: RE | Admit: 2021-10-27 | Discharge: 2021-10-27 | Disposition: A | Discharge status: Home / Self Care | Payer: Medicare Other | Source: Ambulatory Visit | Attending: Gastroenterology | Admitting: Gastroenterology

## 2021-10-27 ENCOUNTER — Telehealth: Payer: Self-pay | Admitting: *Deleted

## 2021-10-27 DIAGNOSIS — K862 Cyst of pancreas: Secondary | ICD-10-CM | POA: Insufficient documentation

## 2021-10-27 DIAGNOSIS — Z800 Family history of malignant neoplasm of digestive organs: Secondary | ICD-10-CM | POA: Diagnosis not present

## 2021-10-27 DIAGNOSIS — R935 Abnormal findings on diagnostic imaging of other abdominal regions, including retroperitoneum: Secondary | ICD-10-CM | POA: Diagnosis not present

## 2021-10-27 DIAGNOSIS — N1330 Unspecified hydronephrosis: Secondary | ICD-10-CM | POA: Diagnosis not present

## 2021-10-27 DIAGNOSIS — Z1211 Encounter for screening for malignant neoplasm of colon: Secondary | ICD-10-CM

## 2021-10-27 DIAGNOSIS — I714 Abdominal aortic aneurysm, without rupture, unspecified: Secondary | ICD-10-CM | POA: Diagnosis not present

## 2021-10-27 LAB — CBC
HCT: 45.3 % — ABNORMAL HIGH (ref 35.0–45.0)
Hemoglobin: 15.2 g/dL (ref 11.7–15.5)
MCH: 29.7 pg (ref 27.0–33.0)
MCHC: 33.6 g/dL (ref 32.0–36.0)
MCV: 88.5 fL (ref 80.0–100.0)
MPV: 11.3 fL (ref 7.5–12.5)
Platelets: 217 10*3/uL (ref 140–400)
RBC: 5.12 10*6/uL — ABNORMAL HIGH (ref 3.80–5.10)
RDW: 13.2 % (ref 11.0–15.0)
WBC: 8.2 10*3/uL (ref 3.8–10.8)

## 2021-10-27 LAB — HEMOGLOBIN A1C
Hgb A1c MFr Bld: 10.8 % of total Hgb — ABNORMAL HIGH (ref <5.7)
Mean Plasma Glucose: 263 mg/dL
eAG (mmol/L): 14.6 mmol/L

## 2021-10-27 LAB — COMPLETE METABOLIC PANEL WITH GFR
AG Ratio: 1.3 (calc) (ref 1.0–2.5)
ALT: 12 U/L (ref 6–29)
AST: 15 U/L (ref 10–35)
Albumin: 4 g/dL (ref 3.6–5.1)
Alkaline phosphatase (APISO): 88 U/L (ref 37–153)
BUN/Creatinine Ratio: 15 (calc) (ref 6–22)
BUN: 18 mg/dL (ref 7–25)
CO2: 27 mmol/L (ref 20–32)
Calcium: 9.8 mg/dL (ref 8.6–10.4)
Chloride: 98 mmol/L (ref 98–110)
Creat: 1.23 mg/dL — ABNORMAL HIGH (ref 0.60–1.00)
Globulin: 3 g/dL (calc) (ref 1.9–3.7)
Glucose, Bld: 405 mg/dL — ABNORMAL HIGH (ref 65–99)
Potassium: 4.7 mmol/L (ref 3.5–5.3)
Sodium: 133 mmol/L — ABNORMAL LOW (ref 135–146)
Total Bilirubin: 0.6 mg/dL (ref 0.2–1.2)
Total Protein: 7 g/dL (ref 6.1–8.1)
eGFR: 46 mL/min/{1.73_m2} — ABNORMAL LOW (ref >=60)

## 2021-10-27 LAB — LIPID PANEL
Cholesterol: 235 mg/dL — ABNORMAL HIGH (ref <200)
HDL: 46 mg/dL — ABNORMAL LOW (ref >=50)
LDL Cholesterol (Calc): 151 mg/dL (calc) — ABNORMAL HIGH
Non-HDL Cholesterol (Calc): 189 mg/dL (calc) — ABNORMAL HIGH (ref <130)
Total CHOL/HDL Ratio: 5.1 (calc) — ABNORMAL HIGH (ref <5.0)
Triglycerides: 250 mg/dL — ABNORMAL HIGH (ref <150)

## 2021-10-27 MED ORDER — GADOBUTROL 1 MMOL/ML IV SOLN
5.0000 mL | Freq: Once | INTRAVENOUS | Status: AC | PRN
  Administered 2021-10-27: 5 mL via INTRAVENOUS

## 2021-10-27 NOTE — Telephone Encounter (Signed)
[facility] called to make sure we got the results after repeat analysis. Glucose 405. Webb Silversmith, NP has already made comment on results.

## 2021-10-28 ENCOUNTER — Other Ambulatory Visit: Payer: Self-pay | Admitting: Internal Medicine

## 2021-10-28 DIAGNOSIS — I714 Abdominal aortic aneurysm, without rupture, unspecified: Secondary | ICD-10-CM

## 2021-10-31 NOTE — Telephone Encounter (Signed)
Patty, Please see below. Please move forward with pancreas protocol CT abdomen to be done at least a few days before my clinic visit. Please schedule at [facility] if possible. Thanks. GM

## 2021-11-01 ENCOUNTER — Telehealth: Payer: Self-pay

## 2021-11-01 DIAGNOSIS — K862 Cyst of pancreas: Secondary | ICD-10-CM

## 2021-11-01 NOTE — Telephone Encounter (Signed)
Li Fragoso, Please see below. Please move forward with pancreas protocol CT abdomen to be done at least a few days before my clinic visit. Please schedule at [facility] if possible. Thanks. GM      Note    You routed conversation to Mansouraty, Netty Starring., MD 3 days ago   Sunny Schlein A Villamor  [facility] (supporting Lemar Lofty., MD) 3 days ago    [employer] said they will cover another CT scan. I would like it to be at [facility], hopefully some time in November. Thank you and have a good day!    Lemar Lofty., MD  [name] 4 days ago    Dear Ms. Spellman, If your insurance will allow [place], we can certainly get a CT scan before the appointment.  With things being stable over the course of multiple months, I think the likelihood of having issues would be quite low but if you remain concerned and we have the ability to get the follow-up scan before your clinic visit that is fine we can certainly try to work on arranging that. It would be quite unlikely that the area would become more "necrotic" when you had a complete necrosectomy. You are not a pain in the neck. Please reply back and let me know what you would like and then if you want to have the CAT scan done we will ask to see if we can get that arranged before your follow-up in December.   Good luck and good health.   Corliss Parish, MD    You routed conversation to Mansouraty, Netty Starring., MD 4 days ago   [name]  [facility] Clinical Pool (supporting Lemar Lofty., MD) 4 days ago    Hi! My children want to know why you can't schedule a CT scan before the appointment. Also, could the magnets have dislodged the clip? Since view was distorted by clip there may be something there that couldn't be seen that would be necessary to save my life. This way by December 6th (appt) you would already know. If we waited til after appt,it could be 3 months before you would see a problem. My children ask a lot  of questions too. I am feeling good at this point. Could the necrotic tail get infected and cause problems like before? I know I could be a pain in the neck, but all these are important to [place]. Thank you for your patience and expertise.     Lemar Lofty., MD  [name] 6 days ago    Dear Ms. Samet, I am glad to hear that you have been doing well. When I went back to my EGD report when we took out your AXIOS stent, I had noted the clips that had been placed when they first attempted the stent placement and then were unsuccessful and had to go in the second region.  I however did not write it in the note and I apologize for that. That clip is from [place]. Nothing to worry about.  It will either fall off in time or just hang out there. Lets see what the MRI/MRCP shows. Good luck and good health.   Corliss Parish, MD    You routed conversation to Mansouraty, Netty Starring., MD 6 days ago   [name]  [facility] Clinical Pool (supporting Lemar Lofty., MD) 6 days ago    Hi! Haven't bothered you in awhile. Just wanted you to  know that I have been feeling very well.  I have not had any ulcer pain, my weight has been stablizing from 119 - 123. I'm hoping that the area of pink viable tissue on my pancreas has been healing. Also, in test reports it states I have a clip in my stomach...could that be clamped in the area where there is a blocked vent? I still can bend over without pain and discomfort. Happy about the cologuard results.  The MRI is scheduled for tomorrow morning and hope nothing unexpected will result from it. I have restarted taking the protein drinks but only about 3 x a week. Hoping all goes well tomorrow and to hear from you soon.

## 2021-11-01 NOTE — Telephone Encounter (Signed)
Order entered for CT scan.  Sent to the schedulers and pt aware to expect a call.

## 2021-11-03 NOTE — Telephone Encounter (Signed)
Holly Navarro can you help with this?

## 2021-11-09 DIAGNOSIS — E119 Type 2 diabetes mellitus without complications: Secondary | ICD-10-CM | POA: Diagnosis not present

## 2021-11-09 DIAGNOSIS — H47022 Hemorrhage in optic nerve sheath, left eye: Secondary | ICD-10-CM | POA: Diagnosis not present

## 2021-11-09 DIAGNOSIS — H2513 Age-related nuclear cataract, bilateral: Secondary | ICD-10-CM | POA: Diagnosis not present

## 2021-11-09 DIAGNOSIS — H25043 Posterior subcapsular polar age-related cataract, bilateral: Secondary | ICD-10-CM | POA: Diagnosis not present

## 2021-12-01 ENCOUNTER — Ambulatory Visit
Admission: RE | Admit: 2021-12-01 | Discharge: 2021-12-01 | Disposition: A | Discharge status: Home / Self Care | Payer: Medicare Other | Source: Ambulatory Visit | Attending: Gastroenterology | Admitting: Gastroenterology

## 2021-12-01 DIAGNOSIS — K862 Cyst of pancreas: Secondary | ICD-10-CM | POA: Insufficient documentation

## 2021-12-01 MED ORDER — IOHEXOL 300 MG/ML  SOLN
80.0000 mL | Freq: Once | INTRAMUSCULAR | Status: AC | PRN
  Administered 2021-12-01: 75 mL via INTRAVENOUS

## 2021-12-02 ENCOUNTER — Telehealth: Payer: Self-pay

## 2021-12-02 NOTE — Telephone Encounter (Signed)
Thanks for doing this Patty. GM

## 2021-12-02 NOTE — Telephone Encounter (Signed)
-----   Message from Lemar Lofty., MD sent at 12/02/2021 12:29 PM EST ----- Regarding: Power share needed Holly Navarro, Please get the recent CT and MRIs Powershared to [facility]. I need to talk with my colleagues so they can help me come up with a plan for her. Thanks. GM

## 2021-12-02 NOTE — Telephone Encounter (Signed)
I spoke with Ashely at [facility] today. She has power shared the CT and MRI.  FYI Dr Meridee Score

## 2021-12-04 ENCOUNTER — Encounter: Payer: Self-pay | Admitting: Gastroenterology

## 2021-12-08 ENCOUNTER — Encounter: Payer: Self-pay | Admitting: Gastroenterology

## 2021-12-08 ENCOUNTER — Ambulatory Visit: Payer: Medicare Other | Admitting: Gastroenterology

## 2021-12-08 VITALS — BP 122/64 | HR 61 | Ht 65.0 in | Wt 125.2 lb

## 2021-12-08 DIAGNOSIS — K862 Cyst of pancreas: Secondary | ICD-10-CM | POA: Diagnosis not present

## 2021-12-08 DIAGNOSIS — Z8719 Personal history of other diseases of the digestive system: Secondary | ICD-10-CM | POA: Diagnosis not present

## 2021-12-08 DIAGNOSIS — Z7689 Persons encountering health services in other specified circumstances: Secondary | ICD-10-CM

## 2021-12-08 DIAGNOSIS — R933 Abnormal findings on diagnostic imaging of other parts of digestive tract: Secondary | ICD-10-CM

## 2021-12-08 NOTE — Progress Notes (Signed)
[facility] VISIT   Primary Care Provider Jearld Fenton, NP [place] [id number] [phone]   Patient Profile: Holly Navarro is a 76 y.o. female with a pmh significant for hypertension, hyperlipidemia anxiety, arthritis peptic ulcer disease, seizures, AAA, insulin-dependent diabetes (secondary to severe pancreatitis), family history colon cancer (son), family history of pancreas cancer (brother), idiopathic pancreatitis in 3500 (complicated pancreatic pseudocyst with walled off necrosis, splenic vein thrombosis-now decompressed and prior necrosectomy but now with recurrent pseudocyst).  The patient presents to the [facility] for an evaluation and management of problem(s) noted below:  Problem List 1. Pancreatic cyst   2. History of pancreatitis   3. Abnormal CT scan, gastrointestinal tract   4. Encounter to establish care      History of Present Illness Please see prior notes for full details of HPI.  Interval History The patient returns for a scheduled follow-up.  She has just undergone her repeat imaging study with results as below that show an overall stable peripancreatic cyst/pseudocyst having recurred.  Her weight has actually gone up by 1 to 2 pounds.  She has minimal discomforts depending on rotation or lateral movements of her abdomen.  She is eating as she normally would without significant early satiety.  Her A1c still remains elevated.  Her Cologuard testing was negative and she was placed for 3-year follow-up.  GI Review of Systems Positive as above Negative for dysphagia, odynophagia, alteration of bowel habits, melena, hematochezia   Review of Systems General: Denies fevers/chills/unintentional weight loss  Cardiovascular: Denies chest pain/palpitations Pulmonary: Denies shortness of breath Gastroenterological: See HPI Genitourinary: Denies darkened urine Hematological: Denies easy  bruising/bleeding Dermatological: Denies jaundice Psychological: Mood is stable   Medications Current Outpatient Medications  Medication Sig Dispense Refill   atenolol (TENORMIN) 50 MG tablet Take 1 tablet (50 mg total) by mouth daily. 90 tablet 0   blood glucose meter kit and supplies KIT Dispense based on patient and insurance preference. Use up to four times daily as directed. 1 each 0   cimetidine (TAGAMET) 200 MG tablet Take 200 mg by mouth 2 (two) times daily as needed (indigestion/heartburn.).     Coenzyme Q10 (COQ10) 100 MG CAPS Take 100 mg by mouth in the morning.     diphenhydrAMINE (BENADRYL) 25 mg capsule Take 25 mg by mouth every 6 (six) hours as needed.     ECHINACEA PO Take 500 mg by mouth in the morning.     fluticasone (FLONASE) 50 MCG/ACT nasal spray Place 2 sprays into both nostrils daily as needed. (Patient taking differently: Place 2 sprays into both nostrils daily.) 16 g 1   Insulin Pen Needle (NOVOFINE PEN NEEDLE) 32G X 6 MM MISC Use to inject insulin daily as directed. 90 each 1   lipase/protease/amylase (CREON) 36000 UNITS CPEP capsule Take 2 capsules (72,000 Units total) by mouth 3 (three) times daily with meals. May also take 1 capsule (36,000 Units total) as needed (with snacks). 720 capsule 2   lisinopril (ZESTRIL) 20 MG tablet Take 1 tablet (20 mg total) by mouth daily. 90 tablet 0   MILK THISTLE PO Take 1 tablet by mouth daily.     Multiple Vitamins-Minerals (MULTIVITAMIN ADULTS 50+) TABS Take 1 tablet by mouth daily in the afternoon.     pantoprazole (PROTONIX) 40 MG tablet Take 1 tablet (40 mg total) by mouth 2 (two) times daily before a meal. 60 tablet 6   Polyethyl Glycol-Propyl Glycol (LUBRICANT EYE  DROPS) 0.4-0.3 % SOLN Place 1 drop into both eyes in the morning and at bedtime.     Red Yeast Rice 600 MG CAPS Take 600 mg by mouth 2 (two) times daily.     simethicone (MYLICON) 008 MG chewable tablet Chew 125 mg by mouth in the morning, at noon, in the  evening, and at bedtime.     TRESIBA FLEXTOUCH 100 UNIT/ML FlexTouch Pen INJECT 15 UNITS INTO THE SKIN DAILY. ADJUST DOSE AS ADVISED 15 mL 0   No current facility-administered medications for this visit.    Allergies Allergies  Allergen Reactions   Fentanyl Other (See Comments)    Patient preference, not an allergy    Morphine And Related Other (See Comments)    Patient preference, not an allergy      Histories Past Medical History:  Diagnosis Date   Anemia    Anxiety    Arthritis    Cardiac arrhythmia    Diabetes mellitus without complication ([place])    Hyperlipidemia    Hypertension    Pancreatic pseudocyst    Seizure ([place])    Stomach ulcer    Past Surgical History:  Procedure Laterality Date   BIOPSY  05/20/2020   Procedure: BIOPSY;  Surgeon: Irving Copas., MD;  Location[facility];  Service: Gastroenterology;;   BIOPSY  02/25/2021   Procedure: BIOPSY;  Surgeon: Irving Copas., MD;  Location: [place];  Service: Gastroenterology;;   BIOPSY  06/24/2021   Procedure: BIOPSY;  Surgeon: Irving Copas., MD;  Location: [place];  Service: Gastroenterology;;   CYST GASTROSTOMY  06/24/2021   Procedure: NECROSECTOMY;  Surgeon: Irving Copas., MD;  Location: [place];  Service: Gastroenterology;;   ESOPHAGOGASTRODUODENOSCOPY N/A 05/20/2020   Procedure: ESOPHAGOGASTRODUODENOSCOPY (EGD);  Surgeon: Irving Copas., MD;  Location: [facility];  Service: Gastroenterology;  Laterality: N/A;   ESOPHAGOGASTRODUODENOSCOPY (EGD) WITH PROPOFOL N/A 02/25/2021   Procedure: ESOPHAGOGASTRODUODENOSCOPY (EGD) WITH PROPOFOL;  Surgeon: Rush Landmark Telford Nab., MD;  Location: [place];  Service: Gastroenterology;  Laterality: N/A;   ESOPHAGOGASTRODUODENOSCOPY (EGD) WITH PROPOFOL N/A 06/24/2021   Procedure: ESOPHAGOGASTRODUODENOSCOPY (EGD) WITH PROPOFOL;  Surgeon: Rush Landmark Telford Nab., MD;  Location: [place];  Service: Gastroenterology;   Laterality: N/A;   FOREIGN BODY REMOVAL  06/24/2021   Procedure: FOREIGN BODY REMOVAL;  Surgeon: Rush Landmark Telford Nab., MD;  Location: [place];  Service: Gastroenterology;;   [place] REMOVAL  06/24/2021   Procedure: STENT REMOVAL;  Surgeon: Irving Copas., MD;  Location: [place];  Service: Gastroenterology;;   TONSILLECTOMY     UPPER ESOPHAGEAL ENDOSCOPIC ULTRASOUND (EUS) N/A 05/20/2020   Procedure: UPPER ESOPHAGEAL ENDOSCOPIC ULTRASOUND (EUS)  ;  Surgeon: Irving Copas., MD;  Location: [facility];  Service: Gastroenterology;  Laterality: N/A;   UPPER ESOPHAGEAL ENDOSCOPIC ULTRASOUND (EUS) N/A 02/25/2021   Procedure: UPPER ESOPHAGEAL ENDOSCOPIC ULTRASOUND (EUS);  Surgeon: Irving Copas., MD;  Location: [place];  Service: Gastroenterology;  Laterality: N/A;   Social History   Socioeconomic History   Marital status: Widowed    Spouse name: Not on file   Number of children: 4   Years of education: Not on file   Highest education level: Not on file  Occupational History   Occupation: retired  Tobacco Use   Smoking status: Every Day    Packs/day: 0.25    Years: 60.00    Total pack years: 15.00    Types: Cigarettes   Smokeless tobacco: Never   Tobacco  comments:    0.25 a pack a day  Vaping Use   Vaping Use: Never used  Substance and Sexual Activity   Alcohol use: Not Currently   Drug use: No   Sexual activity: Not on file  Other Topics Concern   Not on file  Social History Narrative   Not on file   Social Determinants of Health   Financial Resource Strain: Not on file  Food Insecurity: Not on file  Transportation Needs: Not on file  Physical Activity: Not on file  Stress: Not on file  Social Connections: Not on file  Intimate Partner Violence: Not on file   Family History  Problem Relation Age of Onset   Heart disease Mother    Kidney disease Mother    Diabetes Father    Pancreatic cancer  Brother        Agent Orange   Gallstones Daughter    Colon cancer Son 43   Diabetes Paternal Uncle        x 2   Esophageal cancer Neg Hx    Inflammatory bowel disease Neg Hx    Liver disease Neg Hx    Stomach cancer Neg Hx    I have reviewed her medical, social, and family history in detail and updated the electronic medical record as necessary.    PHYSICAL EXAMINATION  BP 122/64   Pulse 61   Ht _0  (1.651 m)   Wt 125 lb 3 oz (56.8 kg)   BMI 20.83 kg/m  GEN: NAD, appears stated age, doesn't appear chronically ill PSYCH: Cooperative, without pressured speech EYE: Conjunctivae pink, sclerae anicteric ENT: MMM CV: Nontachycardic RESP: No audible wheezing GI: NABS, soft, NT/ND, without rebound MSK/EXT: No lower extremity edema SKIN: No jaundice NEURO:  Alert & Oriented x 3, no focal deficits   REVIEW OF DATA  I reviewed the following data at the time of this encounter:  GI Procedures and Studies  No new studies to review  Laboratory Studies  Reviewed those in epic and care everywhere  Imaging Studies  November 2023 CT abdomen with without contrast pancreas protocol IMPRESSION: 1. Thick-walled cystic lesion of the pancreatic body is decreased in size when compared with prior CT and likely a pseudocyst. 2. Abdominal aortic aneurysm, measuring up to 3.2 cm. Recommend follow-up ultrasound every 3 years. This recommendation follows ACR consensus guidelines: White Paper of the ACR Incidental Findings Committee II on Vascular Findings. J Am Coll Radiol 2013; 16:109-604. 3.  Aortic Atherosclerosis (ICD10-I70.0).   ASSESSMENT  Ms. Timberlake is a 76 y.o. 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  The patient is seen today for evaluation and management of:  1. Pancreatic cyst   2. History of pancreatitis   3. Abnormal CT scan, gastrointestinal tract   4. Encounter to establish care    The patient is hemodynamically and clinically stable.  The pseudocyst remains stable at this point but with the overall recurrence in a similar region to where her previous was, I am most concerned about pancreatic duct disruption.  This was not noted on MRI/MRCP however but certainly still concern for me.  We discussed the option of continuing to monitor versus cyst aspiration versus pancreatic ERCP  versus cyst aspiration and pancreatic ERCP.  Patient understands the risks and benefits of each.  She has decided to move forward with cyst aspiration attempt and then monitoring/surveillance.  We will try to do this.  I do not expect to prove able to perform a true pseudocyst gastrostomy due to the significant mount of blood vessels in that particular area but we will see.  At least a cyst aspiration makes sense.  The risks of an EUS including intestinal perforation, bleeding, infection, aspiration, and medication effects were discussed as was the possibility it may not give a definitive diagnosis if a biopsy is performed.  When a biopsy of the pancreas is done as part of the EUS, there is an additional risk of pancreatitis at the rate of about 1-2%.  It was explained that procedure related pancreatitis is typically mild, although it can be severe and even life threatening, which is why we do not perform random pancreatic biopsies and only biopsy a lesion/area we feel is concerning enough to warrant the risk.  /The risks and benefits of endoscopic evaluation were discussed with the patient; these include but are not limited to the risk of perforation, infection, bleeding, missed lesions, lack of  diagnosis, severe illness requiring hospitalization, as well as anesthesia and sedation related illnesses.  The patient and/or family is agreeable to proceed.  All patient questions were answered to the best of my ability, and the patient agrees to the aforementioned plan of action with follow-up as indicated.   PLAN  Proceed with EUS cyst aspiration attempt (not clear if there will be a window based on her significant venous plexus in that area) Consider pancreatic ERCP attempt in future (patient declines at this time) Continue Creon 72,000 units with each meal and 36,000 units with each snack Continue to decrease all tobacco use Cologuard repeat testing in 2026 (2023 was negative) - This was not the ideal test for her (due to family history) but she did not want to undergo colonoscopy so we accepted this as her cancer screening   Orders Placed This Encounter  Procedures   Procedural/ Surgical Case Request: UPPER ENDOSCOPIC ULTRASOUND (EUS) LINEAR   Ambulatory referral to Gastroenterology   Ambulatory referral to Internal Medicine    New Prescriptions   No medications on file   Modified Medications   No medications on file    Planned Follow Up No follow-ups on file.   Total Time in Face-to-Face and in Coordination of Care for patient including independent/personal interpretation/review of prior testing, medical history, examination, medication adjustment, review of imaging, communicating results with the patient directly, and documentation with the EHR is 25 minutes.   Justice Britain, MD [facility] Office # [phone]

## 2021-12-08 NOTE — Patient Instructions (Addendum)
You have been scheduled for an endoscopy. Please follow written instructions given to you at your visit today. If you use inhalers (even only as needed), please bring them with you on the day of your procedure.  You have been referred to [facility]. Their office will contact you for an appointment. If you have not heard from their office in 1-2 weeks, please let us know.   Due to recent changes in healthcare laws, you may see the results of your imaging and laboratory studies on MyChart before your provider has had a chance to review them.  We understand that in some cases there may be results that are confusing or concerning to you. Not all laboratory results come back in the same time frame and the provider may be waiting for multiple results in order to interpret others.  Please give [place] 48 hours in order for your provider to thoroughly review all the results before contacting the office for clarification of your results.   _______________________________________________________  If you are age 60 or older, your body mass index should be between 23-30. Your Body mass index is 20.83 kg/m. If this is out of the aforementioned range listed, please consider follow up with your Primary Care Provider.  If you are age 55 or younger, your body mass index should be between 19-25. Your Body mass index is 20.83 kg/m. If this is out of the aformentioned range listed, please consider follow up with your Primary Care Provider.   _____________________________________________________ The [facility] providers would like to encourage you to use [facility] to communicate with providers for non-urgent requests or questions.  Due to long hold times on the telephone, sending your provider a message by [facility] may be a faster and more efficient way to get a response.  Please allow 48 business hours for a response.  Please remember that this is for non-urgent requests.   _____________________________________________________  Thank you for choosing me and [facility].  Dr. Meridee Score

## 2021-12-10 ENCOUNTER — Telehealth: Payer: Self-pay | Admitting: Internal Medicine

## 2021-12-10 NOTE — Telephone Encounter (Signed)
Left message for patient to call back and schedule the Medicare Annual Wellness Visit (AWV) virtually or by telephone.  Last AWV 12/11/21  Please schedule at anytime with [facility] Advisor.    Any questions, please call me at [phone]

## 2021-12-12 ENCOUNTER — Encounter: Payer: Self-pay | Admitting: Gastroenterology

## 2021-12-16 DIAGNOSIS — H43812 Vitreous degeneration, left eye: Secondary | ICD-10-CM | POA: Diagnosis not present

## 2021-12-16 LAB — HM DIABETES EYE EXAM

## 2021-12-22 ENCOUNTER — Other Ambulatory Visit: Payer: Self-pay | Admitting: Internal Medicine

## 2021-12-22 DIAGNOSIS — Z0000 Encounter for general adult medical examination without abnormal findings: Secondary | ICD-10-CM

## 2021-12-23 ENCOUNTER — Other Ambulatory Visit: Payer: Self-pay

## 2021-12-23 MED ORDER — PANTOPRAZOLE SODIUM 40 MG PO TBEC: 40.0000 mg | DELAYED_RELEASE_TABLET | Freq: Two times a day (BID) | ORAL | 1 refills | Status: DC

## 2021-12-23 NOTE — Telephone Encounter (Signed)
Requested Prescriptions  Pending Prescriptions Disp Refills   lisinopril (ZESTRIL) 20 MG tablet [Pharmacy Med Name: Lisinopril 20 MG Oral Tablet] 90 tablet 0    Sig: Take 1 tablet by mouth once daily     Cardiovascular:  ACE Inhibitors Failed - 12/22/2021  4:37 PM      Failed - Cr in normal range and within 180 days    Creat  Date Value Ref Range Status  10/26/2021 1.23 (H) 0.60 - 1.00 mg/dL Final   Creatinine, Urine  Date Value Ref Range Status  04/22/2021 19 (L) 20 - 275 mg/dL Final         Passed - K in normal range and within 180 days    Potassium  Date Value Ref Range Status  10/26/2021 4.7 3.5 - 5.3 mmol/L Final         Passed - Patient is not pregnant      Passed - Last BP in normal range    BP Readings from Last 1 Encounters:  12/08/21 122/64         Passed - Valid encounter within last 6 months    Recent Outpatient Visits           1 month ago Encounter for general adult medical examination without abnormal findings   [facility] [place], Salvadore Oxford, NP   8 months ago Aortic atherosclerosis [facility])   [facility] [place], Salvadore Oxford, NP   1 year ago Medicare annual wellness visit, subsequent   [facility] [place], Salvadore Oxford, NP   1 year ago Acute on chronic pancreatitis [facility])   [facility] [place], Salvadore Oxford, NP   1 year ago Gastroesophageal reflux disease, unspecified whether esophagitis present   [facility] [place], Salvadore Oxford, NP

## 2022-01-11 DIAGNOSIS — H2511 Age-related nuclear cataract, right eye: Secondary | ICD-10-CM | POA: Diagnosis not present

## 2022-01-12 ENCOUNTER — Encounter (HOSPITAL_COMMUNITY): Payer: Self-pay | Admitting: Gastroenterology

## 2022-01-17 ENCOUNTER — Telehealth: Payer: Self-pay | Admitting: Gastroenterology

## 2022-01-17 NOTE — Telephone Encounter (Signed)
Returned call to patient. Confirmed time of procedure tomorrow. Patient had some concerns of bad weather in the morning. Advised patient that if she did not feel safe coming in the morning to please contact hospital directly, if its before our office opens.

## 2022-01-17 NOTE — Telephone Encounter (Signed)
Patient has a procedure tomorrow and asked to speak with you urgently.  Please call.  Thank you.

## 2022-01-18 ENCOUNTER — Ambulatory Visit (HOSPITAL_COMMUNITY): Payer: Medicare Other | Admitting: Registered Nurse

## 2022-01-18 ENCOUNTER — Encounter (HOSPITAL_COMMUNITY): Payer: Self-pay | Admitting: Gastroenterology

## 2022-01-18 ENCOUNTER — Ambulatory Visit (HOSPITAL_BASED_OUTPATIENT_CLINIC_OR_DEPARTMENT_OTHER): Payer: Medicare Other | Admitting: Registered Nurse

## 2022-01-18 ENCOUNTER — Ambulatory Visit (HOSPITAL_COMMUNITY)
Admission: RE | Admit: 2022-01-18 | Discharge: 2022-01-18 | Disposition: A | Discharge status: Home / Self Care | Payer: Medicare Other | Attending: Gastroenterology | Admitting: Gastroenterology

## 2022-01-18 ENCOUNTER — Encounter (HOSPITAL_COMMUNITY)
Admission: RE | Disposition: A | Discharge status: Home / Self Care | Payer: Self-pay | Source: Home / Self Care | Attending: Gastroenterology

## 2022-01-18 DIAGNOSIS — F419 Anxiety disorder, unspecified: Secondary | ICD-10-CM | POA: Insufficient documentation

## 2022-01-18 DIAGNOSIS — F17210 Nicotine dependence, cigarettes, uncomplicated: Secondary | ICD-10-CM | POA: Diagnosis not present

## 2022-01-18 DIAGNOSIS — Z8719 Personal history of other diseases of the digestive system: Secondary | ICD-10-CM

## 2022-01-18 DIAGNOSIS — K862 Cyst of pancreas: Secondary | ICD-10-CM

## 2022-01-18 DIAGNOSIS — E119 Type 2 diabetes mellitus without complications: Secondary | ICD-10-CM | POA: Insufficient documentation

## 2022-01-18 DIAGNOSIS — I10 Essential (primary) hypertension: Secondary | ICD-10-CM

## 2022-01-18 DIAGNOSIS — K3189 Other diseases of stomach and duodenum: Secondary | ICD-10-CM | POA: Diagnosis not present

## 2022-01-18 DIAGNOSIS — Z800 Family history of malignant neoplasm of digestive organs: Secondary | ICD-10-CM | POA: Insufficient documentation

## 2022-01-18 DIAGNOSIS — R933 Abnormal findings on diagnostic imaging of other parts of digestive tract: Secondary | ICD-10-CM

## 2022-01-18 DIAGNOSIS — I899 Noninfective disorder of lymphatic vessels and lymph nodes, unspecified: Secondary | ICD-10-CM | POA: Diagnosis not present

## 2022-01-18 DIAGNOSIS — M1990 Unspecified osteoarthritis, unspecified site: Secondary | ICD-10-CM | POA: Diagnosis not present

## 2022-01-18 DIAGNOSIS — R9389 Abnormal findings on diagnostic imaging of other specified body structures: Secondary | ICD-10-CM | POA: Diagnosis not present

## 2022-01-18 DIAGNOSIS — K863 Pseudocyst of pancreas: Secondary | ICD-10-CM | POA: Diagnosis not present

## 2022-01-18 DIAGNOSIS — D378 Neoplasm of uncertain behavior of other specified digestive organs: Secondary | ICD-10-CM | POA: Diagnosis not present

## 2022-01-18 DIAGNOSIS — E785 Hyperlipidemia, unspecified: Secondary | ICD-10-CM | POA: Diagnosis not present

## 2022-01-18 DIAGNOSIS — F17200 Nicotine dependence, unspecified, uncomplicated: Secondary | ICD-10-CM | POA: Diagnosis not present

## 2022-01-18 DIAGNOSIS — K2289 Other specified disease of esophagus: Secondary | ICD-10-CM | POA: Diagnosis not present

## 2022-01-18 HISTORY — PX: FINE NEEDLE ASPIRATION: SHX5430

## 2022-01-18 HISTORY — PX: ESOPHAGOGASTRODUODENOSCOPY: SHX5428

## 2022-01-18 HISTORY — PX: EUS: SHX5427

## 2022-01-18 LAB — GLUCOSE, CAPILLARY
Glucose-Capillary: 76 mg/dL (ref 70–99)
Glucose-Capillary: 159 mg/dL — ABNORMAL HIGH (ref 70–99)

## 2022-01-18 SURGERY — EGD (ESOPHAGOGASTRODUODENOSCOPY)
Anesthesia: General

## 2022-01-18 MED ORDER — PROPOFOL 500 MG/50ML IV EMUL: INTRAVENOUS | Status: DC | PRN

## 2022-01-18 MED ORDER — PHENYLEPHRINE HCL (PRESSORS) 10 MG/ML IV SOLN
INTRAVENOUS | Status: AC
  Filled 2022-01-18: qty 1

## 2022-01-18 MED ORDER — LIDOCAINE 2% (20 MG/ML) 5 ML SYRINGE
INTRAMUSCULAR | Status: DC | PRN
  Administered 2022-01-18: 60 mg via INTRAVENOUS

## 2022-01-18 MED ORDER — ONDANSETRON HCL 4 MG/2ML IJ SOLN
INTRAMUSCULAR | Status: DC | PRN
  Administered 2022-01-18: 4 mg via INTRAVENOUS

## 2022-01-18 MED ORDER — PROPOFOL 1000 MG/100ML IV EMUL
INTRAVENOUS | Status: AC
  Filled 2022-01-18: qty 100

## 2022-01-18 MED ORDER — EPHEDRINE SULFATE-NACL 50-0.9 MG/10ML-% IV SOSY
PREFILLED_SYRINGE | INTRAVENOUS | Status: DC | PRN
  Administered 2022-01-18: 5 mg via INTRAVENOUS

## 2022-01-18 MED ORDER — SODIUM CHLORIDE 0.9 % IV SOLN: INTRAVENOUS | Status: DC

## 2022-01-18 MED ORDER — CIPROFLOXACIN HCL 500 MG PO TABS: 500.0000 mg | ORAL_TABLET | Freq: Two times a day (BID) | ORAL | 0 refills | Status: AC

## 2022-01-18 MED ORDER — CIPROFLOXACIN IN D5W 400 MG/200ML IV SOLN
INTRAVENOUS | Status: AC
  Filled 2022-01-18: qty 200

## 2022-01-18 MED ORDER — SUGAMMADEX SODIUM 200 MG/2ML IV SOLN
INTRAVENOUS | Status: DC | PRN
  Administered 2022-01-18: 120 mg via INTRAVENOUS

## 2022-01-18 MED ORDER — PROPOFOL 500 MG/50ML IV EMUL
INTRAVENOUS | Status: AC
  Filled 2022-01-18: qty 50

## 2022-01-18 MED ORDER — PROPOFOL 500 MG/50ML IV EMUL
INTRAVENOUS | Status: DC | PRN
  Administered 2022-01-18 (×2): 20 mg via INTRAVENOUS
  Administered 2022-01-18: 130 mg via INTRAVENOUS

## 2022-01-18 MED ORDER — ROCURONIUM BROMIDE 10 MG/ML (PF) SYRINGE
PREFILLED_SYRINGE | INTRAVENOUS | Status: DC | PRN
  Administered 2022-01-18: 50 mg via INTRAVENOUS

## 2022-01-18 MED ORDER — CIPROFLOXACIN IN D5W 400 MG/200ML IV SOLN
INTRAVENOUS | Status: DC | PRN
  Administered 2022-01-18: 400 mg via INTRAVENOUS

## 2022-01-18 MED ORDER — LACTATED RINGERS IV SOLN: INTRAVENOUS | Status: DC

## 2022-01-18 NOTE — Anesthesia Procedure Notes (Signed)
Procedure Name: Intubation Date/Time: 01/18/2022 7:48 AM  Performed by: Victoriano Lain, CRNAPre-anesthesia Checklist: Patient identified, Emergency Drugs available, Suction available, Patient being monitored and Timeout performed Patient Re-evaluated:Patient Re-evaluated prior to induction Oxygen Delivery Method: Circle system utilized Preoxygenation: Pre-oxygenation with 100% oxygen Induction Type: IV induction Ventilation: Mask ventilation without difficulty Laryngoscope Size: Mac and 4 Grade View: Grade I Tube type: Oral Tube size: 7.5 mm Number of attempts: 1 Airway Equipment and Method: Stylet Placement Confirmation: ETT inserted through vocal cords under direct vision, positive ETCO2 and breath sounds checked- equal and bilateral Secured at: 21 cm Tube secured with: Tape Dental Injury: Teeth and Oropharynx as per pre-operative assessment

## 2022-01-18 NOTE — Transfer of Care (Signed)
Immediate Anesthesia Transfer of Care Note  Patient: Holly Navarro  Procedure(s) Performed: UPPER ENDOSCOPIC ULTRASOUND (EUS) LINEAR ESOPHAGOGASTRODUODENOSCOPY (EGD) FINE NEEDLE ASPIRATION (FNA) LINEAR  Patient Location: PACU and Endoscopy Unit  Anesthesia Type:General  Level of Consciousness: awake, alert , oriented, and patient cooperative  Airway & Oxygen Therapy: Patient Spontanous Breathing and Patient connected to face mask oxygen  Post-op Assessment: Report given to RN, Post -op Vital signs reviewed and stable, and Patient moving all extremities  Post vital signs: Reviewed and stable  Last Vitals:  Vitals Value Taken Time  BP 194/77 01/18/22 0905  Temp 36.2 C 01/18/22 0904  Pulse 62 01/18/22 0907  Resp 20 01/18/22 0907  SpO2 100 % 01/18/22 0907  Vitals shown include unvalidated device data.  Last Pain:  Vitals:   01/18/22 0904  TempSrc: Tympanic  PainSc: 0-No pain         Complications: No notable events documented.

## 2022-01-18 NOTE — Op Note (Signed)
[facility] Patient Name: Holly Navarro Procedure Date: 01/18/2022 MRN: [id number] Attending MD: Justice Britain , MD, [id number] Date of Birth: 1945/03/01 CSN: [id number] Age: 77 Admit Type: Outpatient Procedure:                Upper EUS Indications:              Pancreatic cyst on CT scan, Pancreatic cyst on MRCP Providers:                Justice Britain, MD, Vista Lawman, RN, Fanny Skates RN, RN, Frazier Richards, Technician Referring MD:             Jearld Fenton Medicines:                General Anesthesia, Cipro [id number] mg IV Complications:            No immediate complications. Estimated Blood Loss:     Estimated blood loss was minimal. Procedure:                Pre-Anesthesia Assessment:                           - Prior to the procedure, a History and Physical                            was performed, and patient medications and                            allergies were reviewed. The patient's tolerance of                            previous anesthesia was also reviewed. The risks                            and benefits of the procedure and the sedation                            options and risks were discussed with the patient.                            All questions were answered, and informed consent                            was obtained. Prior Anticoagulants: The patient has                            taken no anticoagulant or antiplatelet agents. ASA                            Grade Assessment: III - A patient with severe                            systemic disease. After reviewing the risks and  benefits, the patient was deemed in satisfactory                            condition to undergo the procedure.                           After obtaining informed consent, the endoscope was                            passed under direct vision. Throughout the                            procedure, the  patient's blood pressure, pulse, and                            oxygen saturations were monitored continuously. The                            [id number]) Olympus therapeutic endoscope                            was introduced through the mouth, and advanced to                            the second part of duodenum. The [id number]                            [id number]) Olympus duodenoscope was introduced                            through the mouth, and advanced to the area of                            papilla. The [id number]) Olympus linear                            ultrasound scope was introduced through the mouth,                            and advanced to the duodenum for ultrasound                            examination from the stomach and duodenum. The                            upper EUS was accomplished without difficulty. The                            patient tolerated the procedure. Scope In: Scope Out: Findings:      ENDOSCOPIC FINDING: :      No gross lesions were noted in the entire esophagus.      The Z-line was irregular and was found 40 cm from the incisors.      No gross lesions were noted in the entire examined  stomach. I did not       see any evidence of gastric varices.      A moderate extrinsic deformity was found in the duodenal sweep.      The major papilla was congested but otherwise no other concerning       findings.      No other gross mucosal lesions were noted in the duodenal bulb, in the       first portion of the duodenum and in the second portion of the duodenum.      ENDOSONOGRAPHIC FINDING: :      An heterogenous lesion suggestive of a cyst was identified in the       pancreatic tail. It is not in obvious communication with the pancreatic       duct. The lesion measured 25 mm by 21 mm in maximal cross-sectional       diameter. However when you scroll through the rest of the tail/body area       you can see that the diameter is at  least 5 cm in overall length. There       was a single compartment without septae. The outer wall of the lesion       was thick. There was no associated mass. There was internal debris       within the fluid-filled cavity. 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 We had a very small window at 49 cm. A diagnostic       and therapeutic needle aspiration for fluid was performed. Color Doppler       imaging was utilized prior to needle puncture to confirm a lack of       significant vascular structures within the needle path. One pass was       made with the Evan expect 22 gauge needle using a       transgastric approach. No stylet was used. The amount of fluid collected       was 5 mL. The fluid was cloudy, purulent and viscous. Sample(s) were       sent for amylase concentration, cytology, bacterial cultures and CEA.      Pancreatic parenchymal abnormalities were noted in the entire pancreas.       These consisted of atrophy. There was no significant pancreatic duct       abnormality. I could not go into station for as a result of the       significant angulation at the duodenal sweep.      Endosonographic imaging in the common bile duct showed no evidence of       any masses or ductal dilation. The gallbladder showed no evidence of       stone disease.      No malignant-appearing lymph nodes were visualized in the celiac region       (level 20) and peripancreatic region.      Endosonographic imaging in the visualized portion of the liver showed no       mass.      There was increased amount of vascular flow in the perisplenic area as       noted on prior imaging concerning for varices/collateralization, based       on endosonographic  examination.      The celiac region was visualized. Impression:                EGD impression:                           - No gross lesions in the entire esophagus. Z-line                            irregular, 40 cm from the incisors.                           - No gross lesions in the entire stomach. No                            evidence of any varices.                           - Duodenal angulation deformity at the sweep.                           - Congested major papilla                           - No other gross lesions in the duodenal bulb, in                            the first portion of the duodenum and in the second                            portion of the duodenum.                           EUS impression:                           - A cystic lesion was seen in the pancreatic tail.                            Tissue has not been obtained. However, the                            endosonographic appearance is consistent with a                            pancreatic pseudocyst/walled off necrosis. Fine                            needle aspiration for fluid performed. This was                            quite difficult as a result of the significant                            collateralization and varices in the area.                           -  Pancreatic parenchymal abnormalities consisting                            of atrophy were noted in the entire pancreas. No                            pancreatic duct dilation was noted.                           - No malignant-appearing lymph nodes were                            visualized in the celiac region (level 20) and                            peripancreatic region. Moderate Sedation:      Not Applicable - Patient had care per Anesthesia. Recommendation:           - The patient will be observed post-procedure,                            until all discharge criteria are met.                           - The patient will be observed post-procedure,                            until all discharge criteria  are met.                           - Patient has a contact number available for                            emergencies. The signs and symptoms of potential                            delayed complications were discussed with the                            patient. Return to normal activities tomorrow.                            Written discharge instructions were provided to the                            patient.                           - Low fat diet.                           - Monitor for signs/symptoms of bleeding,                            perforation, and infection. If issues please call  our number to get further assistance as needed.                           - Ciprofloxacin 500 mg twice daily for 5 days.                            Depending on culture data may need to extend that                            further.                           - Timing of repeat imaging to be discussed. Likely                            in 4 to 6 weeks.                           - If the pancreatic cyst/pseudocys persists, will                            need to consider pancreatic ERCP as previously                            discussed for the concern of pancreatic duct                            disruption.                           - Continue present medications.                           - The findings and recommendations were discussed                            with the patient.                           - The findings and recommendations were discussed                            with the designated responsible adult. Procedure Code(s):        --- Professional ---                           [phone], Esophagogastroduodenoscopy, flexible,                            transoral; with transendoscopic ultrasound-guided                            intramural or transmural fine needle                            aspiration/biopsy(s), (includes endoscopic  ultrasound examination limited to the esophagus,                            stomach or duodenum, and adjacent structures) Diagnosis Code(s):        --- Professional ---                           K22.89, Other specified disease of esophagus                           K31.89, Other diseases of stomach and duodenum                           K86.2, Cyst of pancreas                           K86.9, Disease of pancreas, unspecified                           I89.9, Noninfective disorder of lymphatic vessels                            and lymph nodes, unspecified                           R93.89, Abnormal findings on diagnostic imaging of                            other specified body structures CPT copyright 2022 American [employer] Association. All rights reserved. The codes documented in this report are preliminary and upon coder review may  be revised to meet current compliance requirements. Justice Britain, MD 01/18/2022 9:15:37 AM Number of Addenda: 0

## 2022-01-18 NOTE — Anesthesia Preprocedure Evaluation (Signed)
Anesthesia Evaluation  Patient identified by MRN, date of birth, ID band Patient awake    Reviewed: Allergy & Precautions, H&P , NPO status , Patient's Chart, lab work & pertinent test results  Airway Mallampati: II   Neck ROM: full    Dental   Pulmonary Current Smoker and Patient abstained from smoking.   breath sounds clear to auscultation       Cardiovascular hypertension,  Rhythm:regular Rate:Normal     Neuro/Psych Seizures -,  PSYCHIATRIC DISORDERS Anxiety        GI/Hepatic PUD,,,  Endo/Other  diabetes, Type 2  Pancreatic pseudocyst  Renal/GU      Musculoskeletal  (+) Arthritis ,    Abdominal   Peds  Hematology   Anesthesia Other Findings   Reproductive/Obstetrics                             Anesthesia Physical Anesthesia Plan  ASA: 3  Anesthesia Plan: MAC   Post-op Pain Management:    Induction: Intravenous  PONV Risk Score and Plan: 1 and Propofol infusion and Treatment may vary due to age or medical condition  Airway Management Planned: Nasal Cannula  Additional Equipment:   Intra-op Plan:   Post-operative Plan:   Informed Consent: I have reviewed the patients History and Physical, chart, labs and discussed the procedure including the risks, benefits and alternatives for the proposed anesthesia with the patient or authorized representative who has indicated his/her understanding and acceptance.     Dental advisory given  Plan Discussed with: CRNA, Anesthesiologist and Surgeon  Anesthesia Plan Comments:        Anesthesia Quick Evaluation

## 2022-01-18 NOTE — Discharge Instructions (Signed)
YOU HAD AN ENDOSCOPIC PROCEDURE TODAY: Refer to the procedure report and other information in the discharge instructions given to you for any specific questions about what was found during the examination. If this information does not answer your questions, please call [place] office at [phone] to clarify.  ° °YOU SHOULD EXPECT: Some feelings of bloating in the abdomen. Passage of more gas than usual. Walking can help get rid of the air that was put into your GI tract during the procedure and reduce the bloating. If you had a lower endoscopy (such as a colonoscopy or flexible sigmoidoscopy) you may notice spotting of blood in your stool or on the toilet paper. Some abdominal soreness may be present for a day or two, also. ° °DIET: Your first meal following the procedure should be a light meal and then it is ok to progress to your normal diet. A half-sandwich or bowl of soup is an example of a good first meal. Heavy or fried foods are harder to digest and may make you feel nauseous or bloated. Drink plenty of fluids but you should avoid alcoholic beverages for 24 hours. If you had a esophageal dilation, please see attached instructions for diet.   ° °ACTIVITY: Your care partner should take you home directly after the procedure. You should plan to take it easy, moving slowly for the rest of the day. You can resume normal activity the day after the procedure however YOU SHOULD NOT DRIVE, use power tools, machinery or perform tasks that involve climbing or major physical exertion for 24 hours (because of the sedation medicines used during the test).  ° °SYMPTOMS TO REPORT IMMEDIATELY: °A gastroenterologist can be reached at any hour. Please call [phone]  for any of the following symptoms:  °Following lower endoscopy (colonoscopy, flexible sigmoidoscopy) °Excessive amounts of blood in the stool  °Significant tenderness, worsening of abdominal pains  °Swelling of the abdomen that is new, acute  °Fever of 100° or  higher  °Following upper endoscopy (EGD, EUS, ERCP, esophageal dilation) °Vomiting of blood or coffee ground material  °New, significant abdominal pain  °New, significant chest pain or pain under the shoulder blades  °Painful or persistently difficult swallowing  °New shortness of breath  °Black, tarry-looking or red, bloody stools ° °FOLLOW UP:  °If any biopsies were taken you will be contacted by phone or by letter within the next 1-3 weeks. Call [phone]  if you have not heard about the biopsies in 3 weeks.  °Please also call with any specific questions about appointments or follow up tests. ° °

## 2022-01-18 NOTE — H&P (Signed)
GASTROENTEROLOGY PROCEDURE H&P NOTE   Primary Care Physician: Jearld Fenton, NP  HPI: Holly Navarro is a 77 y.o. female who presents for EUS for cyst aspiration vs cystgastrostomy in what appears to be recurrent pseudocyst with possible ductal disruption (negative on imaging).  Past Medical History:  Diagnosis Date   Anemia    Anxiety    Arthritis    Cardiac arrhythmia    Diabetes mellitus without complication ([place])    Hyperlipidemia    Hypertension    Pancreatic pseudocyst    Seizure [facility])    pt doesnt remember but was told had one in hospital 2022   Stomach ulcer    Past Surgical History:  Procedure Laterality Date   BIOPSY  05/20/2020   Procedure: BIOPSY;  Surgeon: Irving Copas., MD;  Location: [facility];  Service: Gastroenterology;;   BIOPSY  02/25/2021   Procedure: BIOPSY;  Surgeon: Irving Copas., MD;  Location: [place];  Service: Gastroenterology;;   BIOPSY  06/24/2021   Procedure: BIOPSY;  Surgeon: Irving Copas., MD;  Location: [place];  Service: Gastroenterology;;   CYST GASTROSTOMY  06/24/2021   Procedure: NECROSECTOMY;  Surgeon: Irving Copas., MD;  Location: [place];  Service: Gastroenterology;;   ESOPHAGOGASTRODUODENOSCOPY N/A 05/20/2020   Procedure: ESOPHAGOGASTRODUODENOSCOPY (EGD);  Surgeon: Irving Copas., MD;  Location: [facility];  Service: Gastroenterology;  Laterality: N/A;   ESOPHAGOGASTRODUODENOSCOPY (EGD) WITH PROPOFOL N/A 02/25/2021   Procedure: ESOPHAGOGASTRODUODENOSCOPY (EGD) WITH PROPOFOL;  Surgeon: Rush Landmark Telford Nab., MD;  Location: [place];  Service: Gastroenterology;  Laterality: N/A;   ESOPHAGOGASTRODUODENOSCOPY (EGD) WITH PROPOFOL N/A 06/24/2021   Procedure: ESOPHAGOGASTRODUODENOSCOPY (EGD) WITH PROPOFOL;  Surgeon: Rush Landmark Telford Nab., MD;  Location: [place];  Service: Gastroenterology;  Laterality: N/A;   FOREIGN BODY REMOVAL  06/24/2021   Procedure: FOREIGN  BODY REMOVAL;  Surgeon: Rush Landmark Telford Nab., MD;  Location: [place];  Service: Gastroenterology;;   [place] REMOVAL  06/24/2021   Procedure: STENT REMOVAL;  Surgeon: Irving Copas., MD;  Location: [place];  Service: Gastroenterology;;   TONSILLECTOMY     UPPER ESOPHAGEAL ENDOSCOPIC ULTRASOUND (EUS) N/A 05/20/2020   Procedure: UPPER ESOPHAGEAL ENDOSCOPIC ULTRASOUND (EUS)  ;  Surgeon: Irving Copas., MD;  Location: [facility];  Service: Gastroenterology;  Laterality: N/A;   UPPER ESOPHAGEAL ENDOSCOPIC ULTRASOUND (EUS) N/A 02/25/2021   Procedure: UPPER ESOPHAGEAL ENDOSCOPIC ULTRASOUND (EUS);  Surgeon: Irving Copas., MD;  Location: [place];  Service: Gastroenterology;  Laterality: N/A;   Current Facility-Administered Medications  Medication Dose Route Frequency Provider Last Rate Last Admin   0.9 %  sodium chloride infusion   Intravenous Continuous Mansouraty, Telford Nab., MD       lactated ringers infusion   Intravenous Continuous Mansouraty, Telford Nab., MD 50 mL/hr at 01/18/22 0718 Continued from Pre-op at 01/18/22 0718    Current Facility-Administered Medications:    0.9 %  sodium chloride infusion, , Intravenous, Continuous, Mansouraty, Telford Nab., MD   lactated ringers infusion, , Intravenous, Continuous, Mansouraty, Telford Nab., MD, Last Rate: 50 mL/hr at 01/18/22 3846, Continued from Pre-op at 01/18/22 6599 Allergies  Allergen Reactions   Fentanyl Other (See Comments)    Patient preference, not an allergy    Morphine And Related Other (See Comments)    Patient preference, not an allergy     Family History  Problem Relation Age of Onset   Heart disease Mother    Kidney disease Mother    Diabetes  Father    Pancreatic cancer Brother        Agent Orange   Gallstones Daughter    Colon cancer Son 56   Diabetes Paternal Uncle        x 2   Esophageal cancer Neg Hx    Inflammatory bowel disease Neg Hx     Liver disease Neg Hx    Stomach cancer Neg Hx    Social History   Socioeconomic History   Marital status: Widowed    Spouse name: Not on file   Number of children: 4   Years of education: Not on file   Highest education level: Not on file  Occupational History   Occupation: retired  Tobacco Use   Smoking status: Every Day    Packs/day: 0.25    Years: 60.00    Total pack years: 15.00    Types: Cigarettes   Smokeless tobacco: Never   Tobacco comments:    0.25 a pack a day  Vaping Use   Vaping Use: Never used  Substance and Sexual Activity   Alcohol use: Not Currently   Drug use: No   Sexual activity: Not on file  Other Topics Concern   Not on file  Social History Narrative   Not on file   Social Determinants of Health   Financial Resource Strain: Not on file  Food Insecurity: Not on file  Transportation Needs: Not on file  Physical Activity: Not on file  Stress: Not on file  Social Connections: Not on file  Intimate Partner Violence: Not on file    Physical Exam: Today's Vitals   01/12/22 1116 01/18/22 0700  BP:  (!) 158/69  Pulse:  (!) 51  Resp:  19  Temp:  (!) 97.2 F (36.2 C)  TempSrc:  Temporal  SpO2:  97%  Weight: 56.2 kg 56.2 kg  Height:  5\' 6"  (1.676 m)  PainSc:  2    Body mass index is 20.01 kg/m. GEN: NAD EYE: Sclerae anicteric ENT: MMM CV: Non-tachycardic GI: Soft, NT/ND NEURO:  Alert & Oriented x 3  Lab Results: No results for input(s): "WBC", "HGB", "HCT", "PLT" in the last 72 hours. BMET No results for input(s): "NA", "K", "CL", "CO2", "GLUCOSE", "BUN", "CREATININE", "CALCIUM" in the last 72 hours. LFT No results for input(s): "PROT", "ALBUMIN", "AST", "ALT", "ALKPHOS", "BILITOT", "BILIDIR", "IBILI" in the last 72 hours. PT/INR No results for input(s): "LABPROT", "INR" in the last 72 hours.   Impression / Plan: This is a 78 y.o.female who presents for EUS for cyst aspiration vs cystgastrostomy in what appears to be recurrent  pseudocyst with possible ductal disruption (negative on imaging).   The risks of an EUS including intestinal perforation, bleeding, infection, aspiration, and medication effects were discussed as was the possibility it may not give a definitive diagnosis if a biopsy is performed.  When a biopsy of the pancreas is done as part of the EUS, there is an additional risk of pancreatitis at the rate of about 1-2%.  It was explained that procedure related pancreatitis is typically mild, although it can be severe and even life threatening, which is why we do not perform random pancreatic biopsies and only biopsy a lesion/area we feel is concerning enough to warrant the risk.  The risks of an EUS, including intestinal perforation, bleeding, infection, aspiration, and medication effects were discussed.  When a cystgastrostomy/cystenterostomy is performed as part of the EUS, there is an additional risk of pancreatitis at the rate of about  1-2%.  It was explained that procedure related pancreatitis is typically mild, although at times it can be severe and even life threatening.   The risks and benefits of endoscopic evaluation/treatment were discussed with the patient and/or family; these include but are not limited to the risk of perforation, infection, bleeding, missed lesions, lack of diagnosis, severe illness requiring hospitalization, as well as anesthesia and sedation related illnesses.  The patient's history has been reviewed, patient examined, no change in status, and deemed stable for procedure.  The patient and/or family is agreeable to proceed.    Corliss Parish, MD [facility] Office # [phone]

## 2022-01-20 NOTE — Anesthesia Postprocedure Evaluation (Addendum)
Anesthesia Post Note  Patient: Denya A Shuman  Procedure(s) Performed: UPPER ENDOSCOPIC ULTRASOUND (EUS) LINEAR ESOPHAGOGASTRODUODENOSCOPY (EGD) FINE NEEDLE ASPIRATION (FNA) LINEAR     Patient location during evaluation: PACU Anesthesia Type: General Level of consciousness: awake and alert Pain management: pain level controlled Vital Signs Assessment: post-procedure vital signs reviewed and stable Respiratory status: spontaneous breathing, nonlabored ventilation, respiratory function stable and patient connected to nasal cannula oxygen Cardiovascular status: blood pressure returned to baseline and stable Postop Assessment: no apparent nausea or vomiting Anesthetic complications: no   No notable events documented.  Last Vitals:  Vitals:   01/18/22 0932 01/18/22 0934  BP: (!) 163/73   Pulse: (!) 56 (!) 57  Resp: 12 14  Temp:    SpO2: 94% 95%    Last Pain:  Vitals:   01/18/22 0930  TempSrc:   PainSc: 0-No pain                 Bernyce Brimley S

## 2022-01-21 ENCOUNTER — Encounter: Payer: Self-pay | Admitting: Gastroenterology

## 2022-01-21 ENCOUNTER — Other Ambulatory Visit: Payer: Self-pay

## 2022-01-21 DIAGNOSIS — K862 Cyst of pancreas: Secondary | ICD-10-CM

## 2022-01-21 MED ORDER — METRONIDAZOLE 500 MG PO TABS: 500.0000 mg | ORAL_TABLET | Freq: Two times a day (BID) | ORAL | 0 refills | Status: AC

## 2022-01-21 MED ORDER — CIPROFLOXACIN HCL 500 MG PO TABS: 500.0000 mg | ORAL_TABLET | Freq: Two times a day (BID) | ORAL | 0 refills | Status: AC

## 2022-01-22 ENCOUNTER — Encounter: Payer: Self-pay | Admitting: Gastroenterology

## 2022-01-22 ENCOUNTER — Encounter (HOSPITAL_COMMUNITY): Payer: Self-pay | Admitting: Gastroenterology

## 2022-01-22 LAB — AEROBIC/ANAEROBIC CULTURE W GRAM STAIN (SURGICAL/DEEP WOUND)

## 2022-01-22 NOTE — Progress Notes (Signed)
[facility] pancreatic fluid analysis  CEA 3.7 ng/mL Amylase 1345 units/L Glucose 294 mg/dL  CEA 19 ng/mL Amylase 5193 units/L Glucose 62 mg/dL  Issues as a result of shipping problems led to the glucose levels not being completely transparent to potentially real numbers.  However, these findings on CEA and amylase are most suggestive of this peripancreatic fluid collection/walled off necrosis most likely being a remnant pseudocyst.  Further documentation in the chart as per the findings of culture data.  These results will be scanned into the chart and mailed to the patient for her records.   Justice Britain, MD [facility] Office # [phone]

## 2022-01-26 ENCOUNTER — Encounter: Payer: Self-pay | Admitting: Gastroenterology

## 2022-01-27 NOTE — Progress Notes (Signed)
Tomasita Morrow, NP-C Phone: [phone]  Holly Navarro is a 77 y.o. female who presents today for transfer of care. She has no complaints or new concerns today. She is doing well on all of her medications and requesting refills.   HYPERTENSION Disease Monitoring: Blood pressure range- 120-130/80 Chest pain- No      Dyspnea- No Medications: Compliance- Lisinopril and Atenolol Lightheadedness- No   Edema- No  Lab Results  Component Value Date   NA 133 (L) 10/26/2021   K 4.7 10/26/2021   CO2 27 10/26/2021   GLUCOSE 405 (H) 10/26/2021   BUN 18 10/26/2021   CREATININE 1.23 (H) 10/26/2021   CALCIUM 9.8 10/26/2021   EGFR 46 (L) 10/26/2021   GFRNONAA 48 (L) 10/19/2021    DIABETES Disease Monitoring: Blood Sugar ranges- 200s Polyuria/phagia/dipsia- No      Optho- Yes Medications: Compliance- Tresiba 15 u daily Hypoglycemic symptoms- No  Lab Results  Component Value Date   HGBA1C 10.8 (H) 10/26/2021     HYPERLIPIDEMIA Disease Monitoring: See symptoms for Hypertension Medications: Compliance- Red Yeast Rice  Right upper quadrant pain- No  Muscle aches- No  Lab Results  Component Value Date   CHOL 235 (H) 10/26/2021   HDL 46 (L) 10/26/2021   LDLCALC 151 (H) 10/26/2021   TRIG 250 (H) 10/26/2021   CHOLHDL 5.1 (H) 10/26/2021     Social History   Tobacco Use  Smoking Status Every Day   Packs/day: 0.25   Years: 60.00   Total pack years: 15.00   Types: Cigarettes  Smokeless Tobacco Never  Tobacco Comments   0.25 a pack a day    Current Outpatient Medications on File Prior to Visit  Medication Sig Dispense Refill   atenolol (TENORMIN) 50 MG tablet Take 1 tablet (50 mg total) by mouth daily. 90 tablet 0   blood glucose meter kit and supplies KIT Dispense based on patient and insurance preference. Use up to four times daily as directed. 1 each 0   cimetidine (TAGAMET) 200 MG tablet Take 200 mg by mouth 2 (two) times daily as needed (indigestion/heartburn.).      ciprofloxacin (CIPRO) 500 MG tablet Take 1 tablet (500 mg total) by mouth 2 (two) times daily for 7 days. 14 tablet 0   Coenzyme Q10 (COQ10) 100 MG CAPS Take 100 mg by mouth in the morning.     diphenhydrAMINE (BENADRYL) 25 mg capsule Take 25 mg by mouth every 6 (six) hours as needed for allergies.     ECHINACEA-GOLDENSEAL PO Take 1 capsule by mouth in the morning and at bedtime.     fluticasone (FLONASE) 50 MCG/ACT nasal spray Place 2 sprays into both nostrils daily as needed. 16 g 1   Insulin Pen Needle (NOVOFINE PEN NEEDLE) 32G X 6 MM MISC Use to inject insulin daily as directed. 90 each 1   lipase/protease/amylase (CREON) 36000 UNITS CPEP capsule Take 2 capsules (72,000 Units total) by mouth 3 (three) times daily with meals. May also take 1 capsule (36,000 Units total) as needed (with snacks). 720 capsule 2   metroNIDAZOLE (FLAGYL) 500 MG tablet Take 1 tablet (500 mg total) by mouth 2 (two) times daily for 7 days. 14 tablet 0   MILK THISTLE PO Take 1 tablet by mouth in the morning and at bedtime.     Multiple Vitamins-Minerals (MULTIVITAMIN ADULTS 50+) TABS Take 1 tablet by mouth daily in the afternoon.     pantoprazole (PROTONIX) 40 MG tablet Take 1 tablet (40 mg total)  by mouth 2 (two) times daily before a meal. 180 tablet 1   simethicone (MYLICON) 80 MG chewable tablet Chew 80 mg by mouth in the morning, at noon, in the evening, and at bedtime.     Simethicone 125 MG CAPS Take 125 mg by mouth in the morning, at noon, in the evening, and at bedtime.     No current facility-administered medications on file prior to visit.     ROS see history of present illness  Objective  Physical Exam Vitals:   01/28/22 1004  BP: 124/82  Pulse: 82  Temp: 97.6 F (36.4 C)  SpO2: 97%    BP Readings from Last 3 Encounters:  01/28/22 124/82  01/18/22 (!) 163/73  12/08/21 122/64   Wt Readings from Last 3 Encounters:  01/28/22 124 lb 3.2 oz (56.3 kg)  01/18/22 124 lb (56.2 kg)  12/08/21 125 lb  3 oz (56.8 kg)    Physical Exam Constitutional:      General: She is not in acute distress.    Appearance: Normal appearance.  HENT:     Head: Normocephalic.  Cardiovascular:     Rate and Rhythm: Normal rate and regular rhythm.     Heart sounds: Normal heart sounds.  Pulmonary:     Effort: Pulmonary effort is normal.     Breath sounds: Normal breath sounds.  Abdominal:     General: Abdomen is flat. Bowel sounds are normal.     Palpations: Abdomen is soft.  Skin:    General: Skin is warm and dry.  Neurological:     General: No focal deficit present.     Mental Status: She is alert.  Psychiatric:        Mood and Affect: Mood normal.        Behavior: Behavior normal.    Assessment/Plan: Please see individual problem list.  Type 2 diabetes mellitus with hyperglycemia, with long-term current use of insulin (HCC) Assessment & Plan: Uncontrolled. Last A1c- 10.8. Long discussion with patient about diet and insulin needs. She is agreeable to referral to Endocrinology. Diabetic diet information provided to patient.   Orders: -     Tyler Aas FlexTouch; Inject 15 Units into the skin daily. Adjust dose as advised.  Dispense: 15 mL; Refill: 3 -     Ambulatory referral to Endocrinology  Mixed dyslipidemia Assessment & Plan: Chronic. Uncontrolled. The 10-year ASCVD risk score (Arnett DK, et al., 2019) is: 49.9%. Long discussion with patient about increased risk for heart disease and stroke especially with uncontrolled diabetes. Patient agreeable to starting statin. She will discontinue Red yeast Rice and start on Crestor 10 mg daily. Encouraged healthy diet and exercise.    Orders: -     Rosuvastatin Calcium; Take 1 tablet (10 mg total) by mouth daily.  Dispense: 90 tablet; Refill: 3  Essential hypertension Assessment & Plan: Chronic. Stable on Lisinopril 20 mg daily and Atenolol 50 mg daily. Continue.   Orders: -     Lisinopril; Take 1 tablet (20 mg total) by mouth daily.  Dispense:  90 tablet; Refill: 3  Idiopathic chronic pancreatitis (HCC) Assessment & Plan: Continue current medication regimen- Tagamet, Protonix and Creon. Follow up with GI.    Pancreatic cyst Assessment & Plan: Recent cyst aspiration. On Cipro BID and Flagyl BID. Encouraged patient to complete both antibiotics. Follow up with GI.      Return in about 3 months (around 04/29/2022) for Follow up. Please complete fasting lab work 2-3 days prior to appointment.  [name], NP-C [facility] [employer]

## 2022-01-28 ENCOUNTER — Ambulatory Visit (INDEPENDENT_AMBULATORY_CARE_PROVIDER_SITE_OTHER): Payer: Medicare Other | Admitting: Nurse Practitioner

## 2022-01-28 ENCOUNTER — Encounter: Payer: Self-pay | Admitting: Nurse Practitioner

## 2022-01-28 VITALS — BP 124/82 | HR 82 | Temp 97.6°F | Ht 66.0 in | Wt 124.2 lb

## 2022-01-28 DIAGNOSIS — K861 Other chronic pancreatitis: Secondary | ICD-10-CM | POA: Diagnosis not present

## 2022-01-28 DIAGNOSIS — Z794 Long term (current) use of insulin: Secondary | ICD-10-CM

## 2022-01-28 DIAGNOSIS — E119 Type 2 diabetes mellitus without complications: Secondary | ICD-10-CM

## 2022-01-28 DIAGNOSIS — E782 Mixed hyperlipidemia: Secondary | ICD-10-CM

## 2022-01-28 DIAGNOSIS — I10 Essential (primary) hypertension: Secondary | ICD-10-CM | POA: Diagnosis not present

## 2022-01-28 DIAGNOSIS — E1165 Type 2 diabetes mellitus with hyperglycemia: Secondary | ICD-10-CM

## 2022-01-28 DIAGNOSIS — K862 Cyst of pancreas: Secondary | ICD-10-CM

## 2022-01-28 DIAGNOSIS — Z0000 Encounter for general adult medical examination without abnormal findings: Secondary | ICD-10-CM

## 2022-01-28 MED ORDER — ROSUVASTATIN CALCIUM 10 MG PO TABS: 10.0000 mg | ORAL_TABLET | Freq: Every day | ORAL | 3 refills | Status: DC

## 2022-01-28 MED ORDER — TRESIBA FLEXTOUCH 100 UNIT/ML ~~LOC~~ SOPN: 15.0000 [IU] | PEN_INJECTOR | Freq: Every day | SUBCUTANEOUS | 3 refills | Status: DC

## 2022-01-28 MED ORDER — LISINOPRIL 20 MG PO TABS: 20.0000 mg | ORAL_TABLET | Freq: Every day | ORAL | 3 refills | Status: DC

## 2022-01-28 NOTE — Patient Instructions (Signed)
It was nice to meet you!  I have refilled your Lisinopril and Tresiba. Please stop taking the Red Yeast Rice and start the Crestor 10 mg daily.   I have placed the referral to Endocrinology.   I have attached diet information for your diabetes.

## 2022-01-28 NOTE — Assessment & Plan Note (Signed)
Recent cyst aspiration. On Cipro BID and Flagyl BID. Encouraged patient to complete both antibiotics. Follow up with GI.

## 2022-01-28 NOTE — Assessment & Plan Note (Signed)
Uncontrolled. Last A1c- 10.8. Long discussion with patient about diet and insulin needs. She is agreeable to referral to Endocrinology. Diabetic diet information provided to patient.

## 2022-01-28 NOTE — Assessment & Plan Note (Signed)
Chronic. Stable on Lisinopril 20 mg daily and Atenolol 50 mg daily. Continue.

## 2022-01-28 NOTE — Assessment & Plan Note (Addendum)
Continue current medication regimen- Tagamet, Protonix and Creon. Follow up with GI.

## 2022-01-28 NOTE — Assessment & Plan Note (Signed)
Chronic. Uncontrolled. The 10-year ASCVD risk score (Arnett DK, et al., 2019) is: 49.9%. Long discussion with patient about increased risk for heart disease and stroke especially with uncontrolled diabetes. Patient agreeable to starting statin. She will discontinue Red yeast Rice and start on Crestor 10 mg daily. Encouraged healthy diet and exercise.

## 2022-01-28 NOTE — Progress Notes (Signed)
Cytology results from fluid analysis Specimen reveals acute inflammatory exudate; no epithelial cells are seen.  Findings may represent abscess associated with obstructive duct, pancreatitis or inflammation.  This was found on both fluid collection and fluid collection B.  These results will be scanned into the chart and mailed to the patient for her records  No change in plan of action.   Justice Britain, MD [facility] Office # [phone]

## 2022-01-31 ENCOUNTER — Encounter: Payer: Self-pay | Admitting: Ophthalmology

## 2022-02-01 NOTE — Discharge Instructions (Signed)

## 2022-02-03 ENCOUNTER — Ambulatory Visit: Payer: Medicare Other | Admitting: Anesthesiology

## 2022-02-03 ENCOUNTER — Encounter
Admission: RE | Disposition: A | Discharge status: Home / Self Care | Payer: Self-pay | Source: Home / Self Care | Attending: Ophthalmology

## 2022-02-03 ENCOUNTER — Ambulatory Visit
Admission: RE | Admit: 2022-02-03 | Discharge: 2022-02-03 | Disposition: A | Discharge status: Home / Self Care | Payer: Medicare Other | Attending: Ophthalmology | Admitting: Ophthalmology

## 2022-02-03 ENCOUNTER — Other Ambulatory Visit: Payer: Self-pay

## 2022-02-03 DIAGNOSIS — E1122 Type 2 diabetes mellitus with diabetic chronic kidney disease: Secondary | ICD-10-CM | POA: Diagnosis not present

## 2022-02-03 DIAGNOSIS — F17210 Nicotine dependence, cigarettes, uncomplicated: Secondary | ICD-10-CM | POA: Diagnosis not present

## 2022-02-03 DIAGNOSIS — H25041 Posterior subcapsular polar age-related cataract, right eye: Secondary | ICD-10-CM | POA: Diagnosis not present

## 2022-02-03 DIAGNOSIS — E1136 Type 2 diabetes mellitus with diabetic cataract: Secondary | ICD-10-CM | POA: Diagnosis not present

## 2022-02-03 DIAGNOSIS — Z794 Long term (current) use of insulin: Secondary | ICD-10-CM | POA: Diagnosis not present

## 2022-02-03 DIAGNOSIS — H2511 Age-related nuclear cataract, right eye: Secondary | ICD-10-CM | POA: Insufficient documentation

## 2022-02-03 DIAGNOSIS — I10 Essential (primary) hypertension: Secondary | ICD-10-CM | POA: Diagnosis not present

## 2022-02-03 DIAGNOSIS — E785 Hyperlipidemia, unspecified: Secondary | ICD-10-CM | POA: Insufficient documentation

## 2022-02-03 DIAGNOSIS — K219 Gastro-esophageal reflux disease without esophagitis: Secondary | ICD-10-CM | POA: Insufficient documentation

## 2022-02-03 DIAGNOSIS — N183 Chronic kidney disease, stage 3 unspecified: Secondary | ICD-10-CM | POA: Diagnosis not present

## 2022-02-03 DIAGNOSIS — I129 Hypertensive chronic kidney disease with stage 1 through stage 4 chronic kidney disease, or unspecified chronic kidney disease: Secondary | ICD-10-CM | POA: Diagnosis not present

## 2022-02-03 HISTORY — DX: Presence of dental prosthetic device (complete) (partial): Z97.2

## 2022-02-03 HISTORY — PX: CATARACT EXTRACTION W/PHACO: SHX586

## 2022-02-03 LAB — GLUCOSE, CAPILLARY: Glucose-Capillary: 137 mg/dL — ABNORMAL HIGH (ref 70–99)

## 2022-02-03 SURGERY — PHACOEMULSIFICATION, CATARACT, WITH IOL INSERTION
Anesthesia: Monitor Anesthesia Care | Site: Eye | Laterality: Right

## 2022-02-03 MED ORDER — SIGHTPATH DOSE#1 BSS IO SOLN
INTRAOCULAR | Status: DC | PRN
  Administered 2022-02-03 (×2): 15 mL

## 2022-02-03 MED ORDER — SIGHTPATH DOSE#1 BSS IO SOLN: INTRAOCULAR | Status: DC | PRN

## 2022-02-03 MED ORDER — TETRACAINE HCL 0.5 % OP SOLN
1.0000 [drp] | OPHTHALMIC | Status: DC | PRN
  Administered 2022-02-03 (×3): 1 [drp] via OPHTHALMIC

## 2022-02-03 MED ORDER — BRIMONIDINE TARTRATE-TIMOLOL 0.2-0.5 % OP SOLN
OPHTHALMIC | Status: DC | PRN
  Administered 2022-02-03: 1 [drp] via OPHTHALMIC

## 2022-02-03 MED ORDER — LIDOCAINE HCL (PF) 2 % IJ SOLN
INTRAOCULAR | Status: DC | PRN
  Administered 2022-02-03: 1 mL via INTRAOCULAR

## 2022-02-03 MED ORDER — ARMC OPHTHALMIC DILATING DROPS
1.0000 | OPHTHALMIC | Status: DC | PRN
  Administered 2022-02-03 (×3): 1 via OPHTHALMIC

## 2022-02-03 MED ORDER — PHENYLEPHRINE-KETOROLAC 1-0.3 % IO SOLN
INTRAOCULAR | Status: DC | PRN
  Administered 2022-02-03: 167 mL via OPHTHALMIC

## 2022-02-03 MED ORDER — MOXIFLOXACIN HCL 0.5 % OP SOLN
OPHTHALMIC | Status: DC | PRN
  Administered 2022-02-03: .2 mL via OPHTHALMIC

## 2022-02-03 MED ORDER — DEXMEDETOMIDINE HCL IN NACL 200 MCG/50ML IV SOLN
INTRAVENOUS | Status: DC | PRN
  Administered 2022-02-03: 4 ug via INTRAVENOUS

## 2022-02-03 MED ORDER — LACTATED RINGERS IV SOLN: INTRAVENOUS | Status: DC

## 2022-02-03 MED ORDER — NA CHONDROIT SULF-NA HYALURON 40-30 MG/ML IO SOSY
INTRAOCULAR | Status: DC | PRN
  Administered 2022-02-03: .5 mL via INTRAOCULAR

## 2022-02-03 MED ORDER — SIGHTPATH DOSE#1 NA HYALUR & NA CHOND-NA HYALUR IO KIT
PACK | INTRAOCULAR | Status: DC | PRN
  Administered 2022-02-03: 1 via OPHTHALMIC

## 2022-02-03 MED ORDER — MIDAZOLAM HCL 2 MG/2ML IJ SOLN
INTRAMUSCULAR | Status: DC | PRN
  Administered 2022-02-03 (×2): 1 mg via INTRAVENOUS

## 2022-02-03 SURGICAL SUPPLY — 12 items
CATARACT SUITE SIGHTPATH (MISCELLANEOUS) ×1 IMPLANT
DISSECTOR HYDRO NUCLEUS 50X22 (MISCELLANEOUS) ×1 IMPLANT
DRSG TEGADERM 2-3/8X2-3/4 SM (GAUZE/BANDAGES/DRESSINGS) ×1 IMPLANT
FEE CATARACT SUITE SIGHTPATH (MISCELLANEOUS) ×1 IMPLANT
GLOVE SURG SYN 7.5  E (GLOVE) ×1
GLOVE SURG SYN 7.5 E (GLOVE) ×1 IMPLANT
GLOVE SURG SYN 7.5 PF PI (GLOVE) ×1 IMPLANT
GLOVE SURG SYN 8.5  E (GLOVE) ×1
GLOVE SURG SYN 8.5 E (GLOVE) ×1 IMPLANT
GLOVE SURG SYN 8.5 PF PI (GLOVE) ×1 IMPLANT
LENS IOL TECNIS EYHANCE 19.5 (Intraocular Lens) IMPLANT
WATER STERILE IRR 250ML POUR (IV SOLUTION) ×1 IMPLANT

## 2022-02-03 NOTE — Anesthesia Preprocedure Evaluation (Addendum)
Anesthesia Evaluation  Patient identified by MRN, date of birth, ID band Patient awake    Reviewed: Allergy & Precautions, NPO status , Patient's Chart, lab work & pertinent test results  History of Anesthesia Complications Negative for: history of anesthetic complications  Airway Mallampati: III  TM Distance: <3 FB Neck ROM: full    Dental  (+) Chipped, Upper Dentures, Partial Lower   Pulmonary neg shortness of breath, Current Smoker and Patient abstained from smoking.   Pulmonary exam normal        Cardiovascular Exercise Tolerance: Good hypertension, Normal cardiovascular exam     Neuro/Psych Seizures -,   negative psych ROS   GI/Hepatic Neg liver ROS, PUD,GERD  Controlled,,  Endo/Other  diabetes, Type 2    Renal/GU Renal disease     Musculoskeletal   Abdominal   Peds  Hematology negative hematology ROS (+)   Anesthesia Other Findings Past Medical History: No date: Anemia No date: Anxiety No date: Arthritis No date: Cardiac arrhythmia No date: Diabetes mellitus without complication (HCC) No date: Hyperlipidemia No date: Hypertension 02/2020: Necrotizing pancreatitis No date: Pancreatic pseudocyst No date: Seizure [facility])     Comment:  pt doesnt remember but was told had one in hospital 2022 No date: Stomach ulcer No date: Wears dentures     Comment:  full upper, partial lower  Past Surgical History: 05/20/2020: BIOPSY     Comment:  Procedure: BIOPSY;  Surgeon: Irving Copas.,               MD;  Location: [facility];  Service: Gastroenterology;; 02/25/2021: BIOPSY     Comment:  Procedure: BIOPSY;  Surgeon: Irving Copas.,               MD;  Location: [place];  Service: Gastroenterology;; 06/24/2021: BIOPSY     Comment:  Procedure: BIOPSY;  Surgeon: Irving Copas.,               MD;  Location: [place];  Service: Gastroenterology;; 06/24/2021: CYST GASTROSTOMY      Comment:  Procedure: NECROSECTOMY;  Surgeon: Irving Copas., MD;  Location: [facility];  Service:               Gastroenterology;; 05/20/2020: ESOPHAGOGASTRODUODENOSCOPY; N/A     Comment:  Procedure: ESOPHAGOGASTRODUODENOSCOPY (EGD);  Surgeon:               Irving Copas., MD;  Location: [facility];                Service: Gastroenterology;  Laterality: N/A; 01/18/2022: ESOPHAGOGASTRODUODENOSCOPY; N/A     Comment:  Procedure: ESOPHAGOGASTRODUODENOSCOPY (EGD);  Surgeon:               Irving Copas., MD;  Location: [facility];                Service: Gastroenterology;  Laterality: N/A; 02/25/2021: ESOPHAGOGASTRODUODENOSCOPY (EGD) WITH PROPOFOL; N/A     Comment:  Procedure: ESOPHAGOGASTRODUODENOSCOPY (EGD) WITH               PROPOFOL;  Surgeon: Rush Landmark Telford Nab., MD;                Location: [place];  Service: Gastroenterology;                Laterality: N/A; 06/24/2021: ESOPHAGOGASTRODUODENOSCOPY (EGD) WITH PROPOFOL; N/A     Comment:  Procedure: ESOPHAGOGASTRODUODENOSCOPY (EGD)  WITH               PROPOFOL;  Surgeon: Mansouraty, Telford Nab., MD;                Location: [place];  Service: Gastroenterology;                Laterality: N/A; 01/18/2022: EUS; N/A     Comment:  Procedure: UPPER ENDOSCOPIC ULTRASOUND (EUS) LINEAR;                Surgeon: Irving Copas., MD;  Location: [facility]               ENDOSCOPY;  Service: Gastroenterology;  Laterality: N/A; 01/18/2022: FINE NEEDLE ASPIRATION; N/A     Comment:  Procedure: FINE NEEDLE ASPIRATION (FNA) LINEAR;                Surgeon: Irving Copas., MD;  Location: [facility]               ENDOSCOPY;  Service: Gastroenterology;  Laterality: N/A; 06/24/2021: FOREIGN BODY REMOVAL     Comment:  Procedure: FOREIGN BODY REMOVAL;  Surgeon: Rush Landmark               Telford Nab., MD;  Location: [place];  Service:               Gastroenterology;; No date: LAPAROSCOPIC OVARIAN  CYSTECTOMY 06/24/2021: STENT REMOVAL     Comment:  Procedure: STENT REMOVAL;  Surgeon: Irving Copas., MD;  Location: [place];  Service:               Gastroenterology;; No date: TONSILLECTOMY 05/20/2020: UPPER ESOPHAGEAL ENDOSCOPIC ULTRASOUND (EUS); N/A     Comment:  Procedure: UPPER ESOPHAGEAL ENDOSCOPIC ULTRASOUND (EUS)               ;  Surgeon: Irving Copas., MD;  Location: [facility];  Service: Gastroenterology;  Laterality: N/A; 02/25/2021: UPPER ESOPHAGEAL ENDOSCOPIC ULTRASOUND (EUS); N/A     Comment:  Procedure: UPPER ESOPHAGEAL ENDOSCOPIC ULTRASOUND (EUS);              Surgeon: Irving Copas., MD;  Location: [place];  Service: Gastroenterology;  Laterality: N/A;  BMI    Body Mass Index: 20.01 kg/m      Reproductive/Obstetrics negative OB ROS                             Anesthesia Physical Anesthesia Plan  ASA: 3  Anesthesia Plan: MAC   Post-op Pain Management:    Induction: Intravenous  PONV Risk Score and Plan:   Airway Management Planned: Natural Airway and Nasal Cannula  Additional Equipment:   Intra-op Plan:   Post-operative Plan:   Informed Consent: I have reviewed the patients History and Physical, chart, labs and discussed the procedure including the risks, benefits and alternatives for the proposed anesthesia with the patient or authorized representative who has indicated his/her understanding and acceptance.     Dental Advisory Given  Plan Discussed with: Anesthesiologist, CRNA and Surgeon  Anesthesia Plan Comments: (Plan to try to minimize fentanyl per patient request   Patient consented for risks of anesthesia including but not limited to:  -  adverse reactions to medications - damage to eyes, teeth, lips or other oral mucosa - nerve damage due to positioning  - sore throat or hoarseness - Damage to heart, brain, nerves, lungs, other  parts of body or loss of life  Patient voiced understanding.)       Anesthesia Quick Evaluation

## 2022-02-03 NOTE — H&P (Signed)
[facility]   Primary Care Physician:  Tomasita Morrow, NP Ophthalmologist: Dr. Merleen Nicely  Pre-Procedure History & Physical: HPI:  Holly Navarro is a 77 y.o. female here for cataract surgery.   Past Medical History:  Diagnosis Date   Anemia    Anxiety    Arthritis    Cardiac arrhythmia    Diabetes mellitus without complication ([place])    Hyperlipidemia    Hypertension    Necrotizing pancreatitis 02/2020   Pancreatic pseudocyst    Seizure [facility])    pt doesnt remember but was told had one in hospital 2022   Stomach ulcer    Wears dentures    full upper, partial lower    Past Surgical History:  Procedure Laterality Date   BIOPSY  05/20/2020   Procedure: BIOPSY;  Surgeon: Irving Copas., MD;  Location: [facility];  Service: Gastroenterology;;   BIOPSY  02/25/2021   Procedure: BIOPSY;  Surgeon: Irving Copas., MD;  Location: [place];  Service: Gastroenterology;;   BIOPSY  06/24/2021   Procedure: BIOPSY;  Surgeon: Irving Copas., MD;  Location: [place];  Service: Gastroenterology;;   CYST GASTROSTOMY  06/24/2021   Procedure: NECROSECTOMY;  Surgeon: Irving Copas., MD;  Location: [place];  Service: Gastroenterology;;   ESOPHAGOGASTRODUODENOSCOPY N/A 05/20/2020   Procedure: ESOPHAGOGASTRODUODENOSCOPY (EGD);  Surgeon: Irving Copas., MD;  Location: [facility];  Service: Gastroenterology;  Laterality: N/A;   ESOPHAGOGASTRODUODENOSCOPY N/A 01/18/2022   Procedure: ESOPHAGOGASTRODUODENOSCOPY (EGD);  Surgeon: Irving Copas., MD;  Location: [facility];  Service: Gastroenterology;  Laterality: N/A;   ESOPHAGOGASTRODUODENOSCOPY (EGD) WITH PROPOFOL N/A 02/25/2021   Procedure: ESOPHAGOGASTRODUODENOSCOPY (EGD) WITH PROPOFOL;  Surgeon: Rush Landmark Telford Nab., MD;  Location: [place];  Service: Gastroenterology;  Laterality: N/A;   ESOPHAGOGASTRODUODENOSCOPY (EGD) WITH PROPOFOL N/A 06/24/2021   Procedure:  ESOPHAGOGASTRODUODENOSCOPY (EGD) WITH PROPOFOL;  Surgeon: Rush Landmark Telford Nab., MD;  Location: [place];  Service: Gastroenterology;  Laterality: N/A;   EUS N/A 01/18/2022   Procedure: UPPER ENDOSCOPIC ULTRASOUND (EUS) LINEAR;  Surgeon: Irving Copas., MD;  Location: [facility];  Service: Gastroenterology;  Laterality: N/A;   FINE NEEDLE ASPIRATION N/A 01/18/2022   Procedure: FINE NEEDLE ASPIRATION (FNA) LINEAR;  Surgeon: Irving Copas., MD;  Location: [facility];  Service: Gastroenterology;  Laterality: N/A;   FOREIGN BODY REMOVAL  06/24/2021   Procedure: FOREIGN BODY REMOVAL;  Surgeon: Rush Landmark Telford Nab., MD;  Location: [place];  Service: Gastroenterology;;   [place] REMOVAL  06/24/2021   Procedure: STENT REMOVAL;  Surgeon: Irving Copas., MD;  Location: [place];  Service: Gastroenterology;;   TONSILLECTOMY     UPPER ESOPHAGEAL ENDOSCOPIC ULTRASOUND (EUS) N/A 05/20/2020   Procedure: UPPER ESOPHAGEAL ENDOSCOPIC ULTRASOUND (EUS)  ;  Surgeon: Irving Copas., MD;  Location: [facility];  Service: Gastroenterology;  Laterality: N/A;   UPPER ESOPHAGEAL ENDOSCOPIC ULTRASOUND (EUS) N/A 02/25/2021   Procedure: UPPER ESOPHAGEAL ENDOSCOPIC ULTRASOUND (EUS);  Surgeon: Irving Copas., MD;  Location: [place];  Service: Gastroenterology;  Laterality: N/A;    Prior to Admission medications   Medication Sig Start Date End Date Taking? Authorizing Provider  atenolol (TENORMIN) 50 MG tablet Take 1 tablet (50 mg total) by mouth daily. 10/26/21  Yes Jearld Fenton, NP  cimetidine (TAGAMET) 200 MG tablet Take 200 mg by mouth 2 (two) times daily as needed (indigestion/heartburn.).   Yes [provider]  Coenzyme Q10 (COQ10) 100 MG CAPS Take 100 mg by mouth  in the morning.   Yes [provider]  diphenhydrAMINE (BENADRYL) 25 mg capsule Take 25 mg by mouth every 6 (six) hours as needed for  allergies.   Yes [provider]  ECHINACEA-GOLDENSEAL PO Take 1 capsule by mouth in the morning and at bedtime.   Yes [provider]  fluticasone (FLONASE) 50 MCG/ACT nasal spray Place 2 sprays into both nostrils daily as needed. 02/12/21  Yes Baity, Coralie Keens, NP  insulin degludec (TRESIBA FLEXTOUCH) 100 UNIT/ML FlexTouch Pen Inject 15 Units into the skin daily. Adjust dose as advised. 01/28/22  Yes Tomasita Morrow, NP  lipase/protease/amylase (CREON) 36000 UNITS CPEP capsule Take 2 capsules (72,000 Units total) by mouth 3 (three) times daily with meals. May also take 1 capsule (36,000 Units total) as needed (with snacks). 02/09/21  Yes Mansouraty, Telford Nab., MD  lisinopril (ZESTRIL) 20 MG tablet Take 1 tablet (20 mg total) by mouth daily. 01/28/22  Yes Tomasita Morrow, NP  MILK THISTLE PO Take 1 tablet by mouth in the morning and at bedtime.   Yes [provider]  Multiple Vitamins-Minerals (MULTIVITAMIN ADULTS 50+) TABS Take 1 tablet by mouth daily in the afternoon.   Yes [provider]  pantoprazole (PROTONIX) 40 MG tablet Take 1 tablet (40 mg total) by mouth 2 (two) times daily before a meal. 12/23/21 06/21/22 Yes Baity, Coralie Keens, NP  rosuvastatin (CRESTOR) 10 MG tablet Take 1 tablet (10 mg total) by mouth daily. 01/28/22  Yes Tomasita Morrow, NP  simethicone (MYLICON) 80 MG chewable tablet Chew 80 mg by mouth in the morning, at noon, in the evening, and at bedtime.   Yes [provider]  Simethicone 125 MG CAPS Take 125 mg by mouth in the morning, at noon, in the evening, and at bedtime.   Yes [provider]  blood glucose meter kit and supplies KIT Dispense based on patient and insurance preference. Use up to four times daily as directed. 03/13/20   Geradine Girt, DO  Insulin Pen Needle (NOVOFINE PEN NEEDLE) 32G X 6 MM MISC Use to inject insulin daily as directed. 09/28/20   Jearld Fenton, NP    Allergies as of 12/17/2021 - Review Complete  12/12/2021  Allergen Reaction Noted   Fentanyl Other (See Comments) 09/08/2020   Morphine and related Other (See Comments) 09/08/2020    Family History  Problem Relation Age of Onset   Heart disease Mother    Kidney disease Mother    Diabetes Father    Pancreatic cancer Brother        Agent Orange   Gallstones Daughter    Colon cancer Son 66   Diabetes Paternal Uncle        x 2   Esophageal cancer Neg Hx    Inflammatory bowel disease Neg Hx    Liver disease Neg Hx    Stomach cancer Neg Hx     Social History   Socioeconomic History   Marital status: Widowed    Spouse name: Not on file   Number of children: 4   Years of education: Not on file   Highest education level: Not on file  Occupational History   Occupation: retired  Tobacco Use   Smoking status: Every Day    Packs/day: 0.25    Years: 60.00    Total pack years: 15.00    Types: Cigarettes   Smokeless tobacco: Never   Tobacco comments:    0.25 a pack a day (started age 78)  Vaping Use   Vaping Use: Never used  Substance and Sexual Activity   Alcohol use: Not Currently   Drug use: No   Sexual activity: Not on file  Other Topics Concern   Not on file  Social History Narrative   Not on file   Social Determinants of Health   Financial Resource Strain: Not on file  Food Insecurity: Not on file  Transportation Needs: Not on file  Physical Activity: Not on file  Stress: Not on file  Social Connections: Not on file  Intimate Partner Violence: Not on file    Review of Systems: See HPI, otherwise negative ROS  Physical Exam: Ht 5\' 6"  (1.676 m)   Wt 56.2 kg   BMI 20.01 kg/m  General:   Alert, cooperative in NAD Head:  Normocephalic and atraumatic. Respiratory:  Normal work of breathing. Cardiovascular:  RRR  Impression/Plan: Holly Navarro is here for cataract surgery.  Risks, benefits, limitations, and alternatives regarding cataract surgery have been reviewed with the patient.  Questions have  been answered.  All parties agreeable.   Norvel Richards, MD  02/03/2022, 11:31 AM

## 2022-02-03 NOTE — Transfer of Care (Signed)
Immediate Anesthesia Transfer of Care Note  Patient: Holly Navarro  Procedure(s) Performed: CATARACT EXTRACTION PHACO AND INTRAOCULAR LENS PLACEMENT (IOC) RIGHT DIABETIC  9.42  01:08.4 (Right: Eye)  Patient Location: PACU  Anesthesia Type: MAC  Level of Consciousness: awake, alert  and patient cooperative  Airway and Oxygen Therapy: Patient Spontanous Breathing and Patient connected to supplemental oxygen  Post-op Assessment: Post-op Vital signs reviewed, Patient's Cardiovascular Status Stable, Respiratory Function Stable, Patent Airway and No signs of Nausea or vomiting  Post-op Vital Signs: Reviewed and stable  Complications: No notable events documented.

## 2022-02-03 NOTE — Anesthesia Postprocedure Evaluation (Signed)
Anesthesia Post Note  Patient: Holly Navarro  Procedure(s) Performed: CATARACT EXTRACTION PHACO AND INTRAOCULAR LENS PLACEMENT (IOC) RIGHT DIABETIC  9.42  01:08.4 (Right: Eye)  Patient location during evaluation: PACU Anesthesia Type: MAC Level of consciousness: awake and alert Pain management: pain level controlled Vital Signs Assessment: post-procedure vital signs reviewed and stable Respiratory status: spontaneous breathing, nonlabored ventilation and respiratory function stable Cardiovascular status: blood pressure returned to baseline and stable Postop Assessment: no apparent nausea or vomiting Anesthetic complications: no   No notable events documented.   Last Vitals:  Vitals:   02/03/22 1351 02/03/22 1356  BP: 126/82 (!) 161/88  Pulse: 61 (!) 59  Resp: 14 19  Temp: (!) 36.4 C (!) 36.4 C  SpO2: 98% 98%    Last Pain:  Vitals:   02/03/22 1356  TempSrc:   PainSc: 0-No pain                 Precious Haws [place]

## 2022-02-03 NOTE — Op Note (Signed)
OPERATIVE NOTE  Holly Navarro [id number] 02/03/2022   PREOPERATIVE DIAGNOSIS: Nuclear sclerotic cataract right eye. H25.11   POSTOPERATIVE DIAGNOSIS: Nuclear sclerotic cataract right eye. H25.11   PROCEDURE:  Phacoemusification with posterior chamber intraocular lens placement of the right eye  Ultrasound time: Procedure(s) with comments: CATARACT EXTRACTION PHACO AND INTRAOCULAR LENS PLACEMENT (IOC) RIGHT DIABETIC  9.42  01:08.4 (Right) - Diabetic  LENS:   Implant Name Type Inv. Item Serial No. Manufacturer Lot No. LRB No. Used Action  LENS IOL TECNIS EYHANCE 19.5 - [id number] Intraocular Lens LENS IOL TECNIS EYHANCE 19.5 [id number] [employer]  Right 1 Implanted      SURGEON:  Courtney Heys. Lazarus Salines, MD   ANESTHESIA:  Topical with tetracaine drops, augmented with 1% preservative-free intracameral lidocaine.   COMPLICATIONS:  None.   DESCRIPTION OF PROCEDURE:  The patient was identified in the holding room and transported to the operating room and placed in the supine position under the operating microscope.  The right eye was identified as the operative eye, which was prepped and draped in the usual sterile ophthalmic fashion.   A 1 millimeter clear-corneal paracentesis was made superotemporally. Preservative-free 1% lidocaine mixed with 1:1,000 bisulfite-free aqueous solution of epinephrine was injected into the anterior chamber. The anterior chamber was then filled with Viscoat viscoelastic. A 2.4 millimeter keratome was used to make a clear-corneal incision inferotemporally. A curvilinear capsulorrhexis was made with a cystotome and capsulorrhexis forceps. Balanced salt solution was used to hydrodissect and hydrodelineate the nucleus. Phacoemulsification was then used to remove the lens nucleus and epinucleus. The remaining cortex was then removed using the irrigation and aspiration handpiece. Provisc was then placed into the capsular bag to distend it for lens placement. A +19.50 D DIB00  intraocular lens was then injected into the capsular bag. The remaining viscoelastic was aspirated.   Wounds were hydrated with balanced salt solution.  The anterior chamber was inflated to a physiologic pressure with balanced salt solution.  No wound leaks were noted. Vigamox was injected intracamerally.  Timolol and Brimonidine drops were applied to the eye.  The patient was taken to the recovery room in stable condition without complications of anesthesia or surgery.  [employer] 02/03/2022, 1:50 PM

## 2022-02-04 ENCOUNTER — Encounter: Payer: Self-pay | Admitting: Ophthalmology

## 2022-02-08 ENCOUNTER — Encounter: Payer: Self-pay | Admitting: Nurse Practitioner

## 2022-02-10 DIAGNOSIS — H2512 Age-related nuclear cataract, left eye: Secondary | ICD-10-CM | POA: Diagnosis not present

## 2022-02-16 ENCOUNTER — Other Ambulatory Visit: Payer: Self-pay | Admitting: Nurse Practitioner

## 2022-02-16 DIAGNOSIS — E782 Mixed hyperlipidemia: Secondary | ICD-10-CM

## 2022-02-16 DIAGNOSIS — Z1329 Encounter for screening for other suspected endocrine disorder: Secondary | ICD-10-CM

## 2022-02-16 DIAGNOSIS — I10 Essential (primary) hypertension: Secondary | ICD-10-CM

## 2022-02-16 DIAGNOSIS — Z0000 Encounter for general adult medical examination without abnormal findings: Secondary | ICD-10-CM

## 2022-02-16 DIAGNOSIS — E119 Type 2 diabetes mellitus without complications: Secondary | ICD-10-CM

## 2022-02-18 ENCOUNTER — Ambulatory Visit
Admission: RE | Admit: 2022-02-18 | Discharge: 2022-02-18 | Disposition: A | Discharge status: Home / Self Care | Payer: Medicare Other | Source: Ambulatory Visit | Attending: Gastroenterology | Admitting: Gastroenterology

## 2022-02-18 DIAGNOSIS — K863 Pseudocyst of pancreas: Secondary | ICD-10-CM | POA: Diagnosis not present

## 2022-02-18 DIAGNOSIS — I868 Varicose veins of other specified sites: Secondary | ICD-10-CM | POA: Diagnosis not present

## 2022-02-18 DIAGNOSIS — K862 Cyst of pancreas: Secondary | ICD-10-CM | POA: Diagnosis not present

## 2022-02-18 DIAGNOSIS — I700 Atherosclerosis of aorta: Secondary | ICD-10-CM | POA: Diagnosis not present

## 2022-02-18 LAB — POCT I-STAT CREATININE: Creatinine, Ser: 1.2 mg/dL — ABNORMAL HIGH (ref 0.44–1.00)

## 2022-02-18 MED ORDER — IOHEXOL 300 MG/ML  SOLN
100.0000 mL | Freq: Once | INTRAMUSCULAR | Status: AC | PRN
  Administered 2022-02-18: 100 mL via INTRAVENOUS

## 2022-02-21 ENCOUNTER — Telehealth: Payer: Self-pay | Admitting: Gastroenterology

## 2022-02-21 ENCOUNTER — Encounter: Payer: Self-pay | Admitting: Gastroenterology

## 2022-02-21 NOTE — Telephone Encounter (Signed)
See results not dated 2/19

## 2022-02-21 NOTE — Telephone Encounter (Signed)
Patient is returning your call to obtain CT results.

## 2022-02-22 ENCOUNTER — Encounter: Payer: Self-pay | Admitting: Ophthalmology

## 2022-02-28 ENCOUNTER — Telehealth: Payer: Self-pay | Admitting: Nurse Practitioner

## 2022-02-28 ENCOUNTER — Other Ambulatory Visit: Payer: Self-pay | Admitting: Internal Medicine

## 2022-02-28 DIAGNOSIS — Z0000 Encounter for general adult medical examination without abnormal findings: Secondary | ICD-10-CM

## 2022-02-28 MED ORDER — ATENOLOL 50 MG PO TABS: 50.0000 mg | ORAL_TABLET | Freq: Every day | ORAL | 0 refills | Status: DC

## 2022-02-28 NOTE — Addendum Note (Signed)
Addended by: Gracy Racer on: 02/28/2022 03:26 PM   Modules accepted: Orders

## 2022-02-28 NOTE — Telephone Encounter (Signed)
Prescription Request  02/28/2022  Is this a "Controlled Substance" medicine? No  LOV: 01/28/2022  What is the name of the medication or equipment? atenolol (TENORMIN) 50 MG tablet  Have you contacted your pharmacy to request a refill? Yes   Which pharmacy would you like this sent to?  [facility] ([facility] [place] - [address] [place] ([place])  [id number] Phone: [phone] Fax: [phone]    Patient notified that their request is being sent to the clinical staff for review and that they should receive a response within 2 business days.   Please advise at [place]

## 2022-02-28 NOTE — Telephone Encounter (Signed)
Refill sent enough to cover until 04/29/22 appt

## 2022-03-01 NOTE — Telephone Encounter (Signed)
No longer under providers care.

## 2022-03-01 NOTE — Discharge Instructions (Signed)

## 2022-03-03 ENCOUNTER — Ambulatory Visit: Payer: Medicare Other | Admitting: Anesthesiology

## 2022-03-03 ENCOUNTER — Encounter: Payer: Self-pay | Admitting: Ophthalmology

## 2022-03-03 ENCOUNTER — Encounter
Admission: RE | Disposition: A | Discharge status: Home / Self Care | Payer: Self-pay | Source: Home / Self Care | Attending: Ophthalmology

## 2022-03-03 ENCOUNTER — Ambulatory Visit
Admission: RE | Admit: 2022-03-03 | Discharge: 2022-03-03 | Disposition: A | Discharge status: Home / Self Care | Payer: Medicare Other | Attending: Ophthalmology | Admitting: Ophthalmology

## 2022-03-03 ENCOUNTER — Other Ambulatory Visit: Payer: Self-pay

## 2022-03-03 DIAGNOSIS — I10 Essential (primary) hypertension: Secondary | ICD-10-CM | POA: Diagnosis not present

## 2022-03-03 DIAGNOSIS — E1136 Type 2 diabetes mellitus with diabetic cataract: Secondary | ICD-10-CM | POA: Diagnosis not present

## 2022-03-03 DIAGNOSIS — Z794 Long term (current) use of insulin: Secondary | ICD-10-CM | POA: Insufficient documentation

## 2022-03-03 DIAGNOSIS — K219 Gastro-esophageal reflux disease without esophagitis: Secondary | ICD-10-CM | POA: Insufficient documentation

## 2022-03-03 DIAGNOSIS — I129 Hypertensive chronic kidney disease with stage 1 through stage 4 chronic kidney disease, or unspecified chronic kidney disease: Secondary | ICD-10-CM | POA: Diagnosis not present

## 2022-03-03 DIAGNOSIS — E1122 Type 2 diabetes mellitus with diabetic chronic kidney disease: Secondary | ICD-10-CM | POA: Diagnosis not present

## 2022-03-03 DIAGNOSIS — N183 Chronic kidney disease, stage 3 unspecified: Secondary | ICD-10-CM | POA: Diagnosis not present

## 2022-03-03 DIAGNOSIS — E785 Hyperlipidemia, unspecified: Secondary | ICD-10-CM | POA: Diagnosis not present

## 2022-03-03 DIAGNOSIS — H2512 Age-related nuclear cataract, left eye: Secondary | ICD-10-CM | POA: Diagnosis not present

## 2022-03-03 DIAGNOSIS — F17210 Nicotine dependence, cigarettes, uncomplicated: Secondary | ICD-10-CM | POA: Insufficient documentation

## 2022-03-03 HISTORY — PX: CATARACT EXTRACTION W/PHACO: SHX586

## 2022-03-03 LAB — GLUCOSE, CAPILLARY: Glucose-Capillary: 94 mg/dL (ref 70–99)

## 2022-03-03 SURGERY — PHACOEMULSIFICATION, CATARACT, WITH IOL INSERTION
Anesthesia: Monitor Anesthesia Care | Site: Eye | Laterality: Left

## 2022-03-03 MED ORDER — SIGHTPATH DOSE#1 BSS IO SOLN
INTRAOCULAR | Status: DC | PRN
  Administered 2022-03-03: 15 mL

## 2022-03-03 MED ORDER — ARMC OPHTHALMIC DILATING DROPS
1.0000 | OPHTHALMIC | Status: DC | PRN
  Administered 2022-03-03 (×3): 1 via OPHTHALMIC

## 2022-03-03 MED ORDER — LACTATED RINGERS IV SOLN: INTRAVENOUS | Status: DC

## 2022-03-03 MED ORDER — TETRACAINE HCL 0.5 % OP SOLN
1.0000 [drp] | OPHTHALMIC | Status: DC | PRN
  Administered 2022-03-03 (×3): 1 [drp] via OPHTHALMIC

## 2022-03-03 MED ORDER — LIDOCAINE HCL (PF) 2 % IJ SOLN
INTRAOCULAR | Status: DC | PRN
  Administered 2022-03-03: 1 mL via INTRAOCULAR

## 2022-03-03 MED ORDER — SIGHTPATH DOSE#1 NA HYALUR & NA CHOND-NA HYALUR IO KIT
PACK | INTRAOCULAR | Status: DC | PRN
  Administered 2022-03-03: 1 via OPHTHALMIC

## 2022-03-03 MED ORDER — SIGHTPATH DOSE#1 BSS IO SOLN
INTRAOCULAR | Status: DC | PRN
  Administered 2022-03-03: 92 mL via OPHTHALMIC

## 2022-03-03 MED ORDER — MIDAZOLAM HCL 2 MG/2ML IJ SOLN
INTRAMUSCULAR | Status: DC | PRN
  Administered 2022-03-03 (×2): 1 mg via INTRAVENOUS

## 2022-03-03 MED ORDER — MOXIFLOXACIN HCL 0.5 % OP SOLN
OPHTHALMIC | Status: DC | PRN
  Administered 2022-03-03: .2 mL

## 2022-03-03 MED ORDER — BRIMONIDINE TARTRATE-TIMOLOL 0.2-0.5 % OP SOLN
OPHTHALMIC | Status: DC | PRN
  Administered 2022-03-03: 1 [drp] via OPHTHALMIC

## 2022-03-03 SURGICAL SUPPLY — 12 items
CATARACT SUITE SIGHTPATH (MISCELLANEOUS) ×1 IMPLANT
DISSECTOR HYDRO NUCLEUS 50X22 (MISCELLANEOUS) ×1 IMPLANT
DRSG TEGADERM 2-3/8X2-3/4 SM (GAUZE/BANDAGES/DRESSINGS) ×1 IMPLANT
FEE CATARACT SUITE SIGHTPATH (MISCELLANEOUS) ×1 IMPLANT
GLOVE SURG SYN 7.5  E (GLOVE) ×1
GLOVE SURG SYN 7.5 E (GLOVE) ×1 IMPLANT
GLOVE SURG SYN 7.5 PF PI (GLOVE) ×1 IMPLANT
GLOVE SURG SYN 8.5  E (GLOVE) ×1
GLOVE SURG SYN 8.5 E (GLOVE) ×1 IMPLANT
GLOVE SURG SYN 8.5 PF PI (GLOVE) ×1 IMPLANT
LENS IOL TECNIS EYHANCE 19.0 (Intraocular Lens) IMPLANT
WATER STERILE IRR 250ML POUR (IV SOLUTION) ×1 IMPLANT

## 2022-03-03 NOTE — Op Note (Signed)
OPERATIVE NOTE  Holly Navarro [id number] 03/03/2022   PREOPERATIVE DIAGNOSIS: Nuclear sclerotic cataract left eye. H25.12   POSTOPERATIVE DIAGNOSIS: Nuclear sclerotic cataract left eye. H25.12   PROCEDURE:  Phacoemusification with posterior chamber intraocular lens placement of the left eye  Ultrasound time: Procedure(s): CATARACT EXTRACTION PHACO AND INTRAOCULAR LENS PLACEMENT (IOC) LEFT DIABETIC  12.57  01:09.8 (Left)  LENS:   Implant Name Type Inv. Item Serial No. Manufacturer Lot No. LRB No. Used Action  LENS IOL TECNIS EYHANCE 19.0 - [id number] Intraocular Lens LENS IOL TECNIS EYHANCE 19.0 [id number] [employer]  Left 1 Implanted      SURGEON:  Courtney Heys. Lazarus Salines, MD   ANESTHESIA:  Topical with tetracaine drops, augmented with 1% preservative-free intracameral lidocaine.   COMPLICATIONS:  None.   DESCRIPTION OF PROCEDURE:  The patient was identified in the holding room and transported to the operating room and placed in the supine position under the operating microscope.  The left eye was identified as the operative eye, which was prepped and draped in the usual sterile ophthalmic fashion.   A 1 millimeter clear-corneal paracentesis was made inferotemporally. Preservative-free 1% lidocaine mixed with 1:1,000 bisulfite-free aqueous solution of epinephrine was injected into the anterior chamber. The anterior chamber was then filled with Viscoat viscoelastic. A 2.4 millimeter keratome was used to make a clear-corneal incision superotemporally. A curvilinear capsulorrhexis was made with a cystotome and capsulorrhexis forceps. Balanced salt solution was used to hydrodissect and hydrodelineate the nucleus. Phacoemulsification was then used to remove the lens nucleus and epinucleus. The remaining cortex was then removed using the irrigation and aspiration handpiece. Provisc was then placed into the capsular bag to distend it for lens placement. A +19.00 D DIB00 intraocular lens was then  injected into the capsular bag. The remaining viscoelastic was aspirated.   Wounds were hydrated with balanced salt solution.  The anterior chamber was inflated to a physiologic pressure with balanced salt solution.  No wound leaks were noted. Vigamox was injected intracamerally.  Timolol and Brimonidine drops were applied to the eye.  The patient was taken to the recovery room in stable condition without complications of anesthesia or surgery.  [name] 03/03/2022, 12:43 PM

## 2022-03-03 NOTE — Anesthesia Preprocedure Evaluation (Signed)
Anesthesia Evaluation  Patient identified by MRN, date of birth, ID band Patient awake    Reviewed: Allergy & Precautions, NPO status , Patient's Chart, lab work & pertinent test results  History of Anesthesia Complications Negative for: history of anesthetic complications  Airway Mallampati: III  TM Distance: <3 FB Neck ROM: full    Dental  (+) Chipped, Upper Dentures, Partial Lower   Pulmonary neg shortness of breath, Current Smoker and Patient abstained from smoking.   Pulmonary exam normal        Cardiovascular Exercise Tolerance: Good hypertension, Normal cardiovascular exam     Neuro/Psych Seizures -,   negative psych ROS   GI/Hepatic Neg liver ROS, PUD,GERD  Controlled,,  Endo/Other  diabetes, Type 2    Renal/GU Renal disease     Musculoskeletal   Abdominal   Peds  Hematology negative hematology ROS (+)   Anesthesia Other Findings Past Medical History: No date: Anemia No date: Anxiety No date: Arthritis No date: Cardiac arrhythmia No date: Diabetes mellitus without complication (HCC) No date: Hyperlipidemia No date: Hypertension 02/2020: Necrotizing pancreatitis No date: Pancreatic pseudocyst No date: Seizure (HCC)     Comment:  pt doesnt remember but was told had one in hospital 2022 No date: Stomach ulcer No date: Wears dentures     Comment:  full upper, partial lower  Past Surgical History: 05/20/2020: BIOPSY     Comment:  Procedure: BIOPSY;  Surgeon: Mansouraty, Gabriel Jr.,               MD;  Location: [facility];  Service: Gastroenterology;; 02/25/2021: BIOPSY     Comment:  Procedure: BIOPSY;  Surgeon: Mansouraty, Gabriel Jr.,               MD;  [facility];  Service: Gastroenterology;; 06/24/2021: BIOPSY     Comment:  Procedure: BIOPSY;  Surgeon: Mansouraty, Gabriel Jr.,               MD;  [facility];  Service: Gastroenterology;; 06/24/2021: CYST GASTROSTOMY      Comment:  Procedure: NECROSECTOMY;  Surgeon: Mansouraty, Gabriel               Jr., MD;  [facility];  Service:               Gastroenterology;; 05/20/2020: ESOPHAGOGASTRODUODENOSCOPY; N/A     Comment:  Procedure: ESOPHAGOGASTRODUODENOSCOPY (EGD);  Surgeon:               Mansouraty, Gabriel Jr., MD;  [facility]: [facility];                Service: Gastroenterology;  Laterality: N/A; 01/18/2022: ESOPHAGOGASTRODUODENOSCOPY; N/A     Comment:  Procedure: ESOPHAGOGASTRODUODENOSCOPY (EGD);  Surgeon:               Mansouraty, Gabriel Jr., MD;  [facility];                Service: Gastroenterology;  Laterality: N/A; 02/25/2021: ESOPHAGOGASTRODUODENOSCOPY (EGD) WITH PROPOFOL; N/A     Comment:  Procedure: ESOPHAGOGASTRODUODENOSCOPY (EGD) WITH               PROPOFOL;  Surgeon: Mansouraty, Gabriel Jr., MD;                [facility];  Service: Gastroenterology;                Laterality: N/A; 06/24/2021: ESOPHAGOGASTRODUODENOSCOPY (EGD) WITH PROPOFOL; N/A     Comment:  Procedure: ESOPHAGOGASTRODUODENOSCOPY (EGD)   WITH               PROPOFOL;  Surgeon: Mansouraty, Gabriel Jr., MD;                [facility];  Service: Gastroenterology;                Laterality: N/A; 01/18/2022: EUS; N/A     Comment:  Procedure: UPPER ENDOSCOPIC ULTRASOUND (EUS) LINEAR;                Surgeon: Mansouraty, Gabriel Jr., MD;  [facility];  Service: Gastroenterology;  Laterality: N/A; 01/18/2022: FINE NEEDLE ASPIRATION; N/A     Comment:  Procedure: FINE NEEDLE ASPIRATION (FNA) LINEAR;                Surgeon: Mansouraty, Gabriel Jr., MD;  [facility];  Service: Gastroenterology;  Laterality: N/A; 06/24/2021: FOREIGN BODY REMOVAL     Comment:  Procedure: FOREIGN BODY REMOVAL;  Surgeon: Mansouraty,               Gabriel Jr., MD;  [facility];  Service:               Gastroenterology;; No date: LAPAROSCOPIC OVARIAN  CYSTECTOMY 06/24/2021: STENT REMOVAL     Comment:  Procedure: STENT REMOVAL;  Surgeon: Mansouraty, Gabriel               Jr., MD;  [facility];  Service:               Gastroenterology;; No date: TONSILLECTOMY 05/20/2020: UPPER ESOPHAGEAL ENDOSCOPIC ULTRASOUND (EUS); N/A     Comment:  Procedure: UPPER ESOPHAGEAL ENDOSCOPIC ULTRASOUND (EUS)               ;  Surgeon: Mansouraty, Gabriel Jr., MD;  [facility];  Service: Gastroenterology;  Laterality: N/A; 02/25/2021: UPPER ESOPHAGEAL ENDOSCOPIC ULTRASOUND (EUS); N/A     Comment:  Procedure: UPPER ESOPHAGEAL ENDOSCOPIC ULTRASOUND (EUS);              Surgeon: Mansouraty, Gabriel Jr., MD;  [facility];  Service: Gastroenterology;  Laterality: N/A;  BMI    Body Mass Index: 20.01 kg/m      Reproductive/Obstetrics negative OB ROS                             Anesthesia Physical Anesthesia Plan  ASA: 3  Anesthesia Plan: MAC   Post-op Pain Management:    Induction: Intravenous  PONV Risk Score and Plan:   Airway Management Planned: Natural Airway and Nasal Cannula  Additional Equipment:   Intra-op Plan:   Post-operative Plan:   Informed Consent: I have reviewed the patients History and Physical, chart, labs and discussed the procedure including the risks, benefits and alternatives for the proposed anesthesia with the patient or authorized representative who has indicated his/her understanding and acceptance.     Dental Advisory Given  Plan Discussed with: Anesthesiologist, CRNA and Surgeon  Anesthesia Plan Comments: (Plan to try to minimize fentanyl per patient request   Patient consented for risks of anesthesia including but not limited to:  -   adverse reactions to medications - damage to eyes, teeth, lips or other oral mucosa - nerve damage due to positioning  - sore throat or hoarseness - Damage to heart, brain, nerves, lungs, other  parts of body or loss of life  Patient voiced understanding.)       Anesthesia Quick Evaluation  

## 2022-03-03 NOTE — Anesthesia Postprocedure Evaluation (Signed)
Anesthesia Post Note  Patient: Holly Navarro  Procedure(s) Performed: CATARACT EXTRACTION PHACO AND INTRAOCULAR LENS PLACEMENT (IOC) LEFT DIABETIC  12.57  01:09.8 (Left: Eye)  Patient location during evaluation: PACU Anesthesia Type: MAC Level of consciousness: awake and alert Pain management: pain level controlled Vital Signs Assessment: post-procedure vital signs reviewed and stable Respiratory status: spontaneous breathing, nonlabored ventilation, respiratory function stable and patient connected to nasal cannula oxygen Cardiovascular status: stable and blood pressure returned to baseline Postop Assessment: no apparent nausea or vomiting Anesthetic complications: no   No notable events documented.   Last Vitals:  Vitals:   03/03/22 1244 03/03/22 1248  BP: 107/76 126/75  Pulse: (!) 57 62  Resp: 12 15  Temp: (!) 36.1 C (!) 36.1 C  SpO2: 100% 100%    Last Pain:  Vitals:   03/03/22 1248  TempSrc:   PainSc: 0-No pain                 Arita Miss

## 2022-03-03 NOTE — Transfer of Care (Signed)
Immediate Anesthesia Transfer of Care Note  Patient: Holly Navarro  Procedure(s) Performed: CATARACT EXTRACTION PHACO AND INTRAOCULAR LENS PLACEMENT (IOC) LEFT DIABETIC  12.57  01:09.8 (Left: Eye)  Patient Location: PACU  Anesthesia Type: MAC  Level of Consciousness: awake, alert  and patient cooperative  Airway and Oxygen Therapy: Patient Spontanous Breathing and Patient connected to supplemental oxygen  Post-op Assessment: Post-op Vital signs reviewed, Patient's Cardiovascular Status Stable, Respiratory Function Stable, Patent Airway and No signs of Nausea or vomiting  Post-op Vital Signs: Reviewed and stable  Complications: No notable events documented.

## 2022-03-03 NOTE — H&P (Signed)
[facility]   Primary Care Physician:  Tomasita Morrow, NP Ophthalmologist: Dr. Merleen Nicely  Pre-Procedure History & Physical: HPI:  Holly Navarro is a 77 y.o. female here for cataract surgery.   Past Medical History:  Diagnosis Date   Anemia    Anxiety    Arthritis    Cardiac arrhythmia    Diabetes mellitus without complication ([place])    Hyperlipidemia    Hypertension    Necrotizing pancreatitis 02/2020   Pancreatic pseudocyst    Seizure [facility])    pt doesnt remember but was told had one in hospital 2022   Stomach ulcer    Wears dentures    full upper, partial lower    Past Surgical History:  Procedure Laterality Date   BIOPSY  05/20/2020   Procedure: BIOPSY;  Surgeon: Irving Copas., MD;  Location: [facility];  Service: Gastroenterology;;   BIOPSY  02/25/2021   Procedure: BIOPSY;  Surgeon: Irving Copas., MD;  Location: [facility] END[facility]COPY;  Service: Gastroenterology;;   BIOPSY  06/24/2021   Procedure: BIOPSY;  Surgeon: Irving Copas., MD;  Location: [place];  Service: Gastroenterology;;   CATARACT EXTRACTION W/PHACO Right 02/03/2022   Procedure: CATARACT EXTRACTION PHACO AND INTRAOCULAR LENS PLACEMENT ([place]) RIGHT DIABETIC  9.42  01:08.4;  Surgeon: Norvel Richards, MD;  Location: [place];  Service: Ophthalmology;  Laterality: Right;  Diabetic   CYST GASTROSTOMY  06/24/2021   Procedure: NECROSECTOMY;  Surgeon: Rush Landmark Telford Nab., MD;  Location: [place];  Service: Gastroenterology;;   ESOPHAGOGASTRODUODENOSCOPY N/A 05/20/2020   Procedure: ESOPHAGOGASTRODUODENOSCOPY (EGD);  Surgeon: Irving Copas., MD;  Location: [facility];  Service: Gastroenterology;  Laterality: N/A;   ESOPHAGOGASTRODUODENOSCOPY N/A 01/18/2022   Procedure: ESOPHAGOGASTRODUODENOSCOPY (EGD);  Surgeon: Irving Copas., MD;  Location: [facility];  Service: Gastroenterology;  Laterality: N/A;   ESOPHAGOGASTRODUODENOSCOPY (EGD) WITH  PROPOFOL N/A 02/25/2021   Procedure: ESOPHAGOGASTRODUODENOSCOPY (EGD) WITH PROPOFOL;  Surgeon: Rush Landmark Telford Nab., MD;  Location: [place];  Service: Gastroenterology;  Laterality: N/A;   ESOPHAGOGASTRODUODENOSCOPY (EGD) WITH PROPOFOL N/A 06/24/2021   Procedure: ESOPHAGOGASTRODUODENOSCOPY (EGD) WITH PROPOFOL;  Surgeon: Rush Landmark Telford Nab., MD;  Location: [place];  Service: Gastroenterology;  Laterality: N/A;   EUS N/A 01/18/2022   Procedure: UPPER ENDOSCOPIC ULTRASOUND (EUS) LINEAR;  Surgeon: Irving Copas., MD;  [facility];  Service: Gastroenterology;  Laterality: N/A;   FINE NEEDLE ASPIRATION N/A 01/18/2022   Procedure: FINE NEEDLE ASPIRATION (FNA) LINEAR;  Surgeon: Irving Copas., MD;  [facility];  Service: Gastroenterology;  Laterality: N/A;   FOREIGN BODY REMOVAL  06/24/2021   Procedure: FOREIGN BODY REMOVAL;  Surgeon: Rush Landmark Telford Nab., MD;  Location: [place];  Service: Gastroenterology;;   [place] REMOVAL  06/24/2021   Procedure: STENT REMOVAL;  Surgeon: Irving Copas., MD;  Location: [place];  Service: Gastroenterology;;   TONSILLECTOMY     UPPER ESOPHAGEAL ENDOSCOPIC ULTRASOUND (EUS) N/A 05/20/2020   Procedure: UPPER ESOPHAGEAL ENDOSCOPIC ULTRASOUND (EUS)  ;  Surgeon: Irving Copas., MD;  Location: [facility];  Service: Gastroenterology;  Laterality: N/A;   UPPER ESOPHAGEAL ENDOSCOPIC ULTRASOUND (EUS) N/A 02/25/2021   Procedure: UPPER ESOPHAGEAL ENDOSCOPIC ULTRASOUND (EUS);  Surgeon: Irving Copas., MD;  Location: [place];  Service: Gastroenterology;  Laterality: N/A;    Prior to Admission medications   Medication Sig Start Date End Date Taking? Authorizing Provider  atenolol (TENORMIN) 50 MG tablet Take 1 tablet (50 mg total) by mouth daily.  02/28/22  Yes Tomasita Morrow, NP  cimetidine (TAGAMET) 200 MG tablet Take 200 mg by mouth 2 (two) times daily as  needed (indigestion/heartburn.).   Yes [provider]  Coenzyme Q10 (COQ10) 100 MG CAPS Take 100 mg by mouth in the morning.   Yes [provider]  diphenhydrAMINE (BENADRYL) 25 mg capsule Take 25 mg by mouth every 6 (six) hours as needed for allergies.   Yes [provider]  ECHINACEA-GOLDENSEAL PO Take 1 capsule by mouth in the morning and at bedtime.   Yes [provider]  fluticasone (FLONASE) 50 MCG/ACT nasal spray Place 2 sprays into both nostrils daily as needed. 02/12/21  Yes Baity, Coralie Keens, NP  insulin degludec (TRESIBA FLEXTOUCH) 100 UNIT/ML FlexTouch Pen Inject 15 Units into the skin daily. Adjust dose as advised. 01/28/22  Yes Tomasita Morrow, NP  lipase/protease/amylase (CREON) 36000 UNITS CPEP capsule Take 2 capsules (72,000 Units total) by mouth 3 (three) times daily with meals. May also take 1 capsule (36,000 Units total) as needed (with snacks). 02/09/21  Yes Mansouraty, Telford Nab., MD  lisinopril (ZESTRIL) 20 MG tablet Take 1 tablet (20 mg total) by mouth daily. 01/28/22  Yes Tomasita Morrow, NP  blood glucose meter kit and supplies KIT Dispense based on patient and insurance preference. Use up to four times daily as directed. 03/13/20   Geradine Girt, DO  Insulin Pen Needle (NOVOFINE PEN NEEDLE) 32G X 6 MM MISC Use to inject insulin daily as directed. 09/28/20   Jearld Fenton, NP  MILK THISTLE PO Take 1 tablet by mouth in the morning and at bedtime.    [provider]  Multiple Vitamins-Minerals (MULTIVITAMIN ADULTS 50+) TABS Take 1 tablet by mouth daily in the afternoon.    [provider]  pantoprazole (PROTONIX) 40 MG tablet Take 1 tablet (40 mg total) by mouth 2 (two) times daily before a meal. 12/23/21 06/21/22  Jearld Fenton, NP  rosuvastatin (CRESTOR) 10 MG tablet Take 1 tablet (10 mg total) by mouth daily. 01/28/22   Tomasita Morrow, NP  simethicone (MYLICON) 80 MG chewable tablet Chew 80 mg by mouth in the morning, at noon, in the  evening, and at bedtime.    [provider]  Simethicone 125 MG CAPS Take 125 mg by mouth in the morning, at noon, in the evening, and at bedtime.    [provider]    Allergies as of 02/11/2022 - Review Complete 02/03/2022  Allergen Reaction Noted   Fentanyl Other (See Comments) 09/08/2020   Morphine and related Other (See Comments) 09/08/2020    Family History  Problem Relation Age of Onset   Heart disease Mother    Kidney disease Mother    Diabetes Father    Pancreatic cancer Brother        Agent Orange   Gallstones Daughter    Colon cancer Son 42   Diabetes Paternal Uncle        x 2   Esophageal cancer Neg Hx    Inflammatory bowel disease Neg Hx    Liver disease Neg Hx    Stomach cancer Neg Hx     Social History   Socioeconomic History   Marital status: Widowed    Spouse name: Not on file   Number of children: 4   Years of education: Not on file   Highest education level: Not on file  Occupational History   Occupation: retired  Tobacco Use   Smoking status: Every Day  Packs/day: 0.25    Years: 60.00    Total pack years: 15.00    Types: Cigarettes   Smokeless tobacco: Never   Tobacco comments:    0.25 a pack a day (started age 80)  Vaping Use   Vaping Use: Never used  Substance and Sexual Activity   Alcohol use: Not Currently   Drug use: No   Sexual activity: Not on file  Other Topics Concern   Not on file  Social History Narrative   Not on file   Social Determinants of Health   Financial Resource Strain: Not on file  Food Insecurity: Not on file  Transportation Needs: Not on file  Physical Activity: Not on file  Stress: Not on file  Social Connections: Not on file  Intimate Partner Violence: Not on file    Review of Systems: See HPI, otherwise negative ROS  Physical Exam: BP (!) 150/80   Pulse 61   Temp 97.9 F (36.6 C) (Temporal)   Resp 18   Ht '5\' 6"'$  (1.676 m)   Wt 55.3 kg   SpO2 97%   BMI 19.69 kg/m  General:    Alert, cooperative in NAD Head:  Normocephalic and atraumatic. Respiratory:  Normal work of breathing. Cardiovascular:  RRR  Impression/Plan: Branden A Kirchner is here for cataract surgery.  Risks, benefits, limitations, and alternatives regarding cataract surgery have been reviewed with the patient.  Questions have been answered.  All parties agreeable.   Norvel Richards, MD  03/03/2022, 12:04 PM

## 2022-03-04 ENCOUNTER — Encounter: Payer: Self-pay | Admitting: Ophthalmology

## 2022-03-08 ENCOUNTER — Telehealth: Payer: Self-pay

## 2022-03-08 ENCOUNTER — Other Ambulatory Visit: Payer: Self-pay

## 2022-03-08 DIAGNOSIS — Z0000 Encounter for general adult medical examination without abnormal findings: Secondary | ICD-10-CM

## 2022-03-08 MED ORDER — ATENOLOL 50 MG PO TABS: 50.0000 mg | ORAL_TABLET | Freq: Every day | ORAL | 3 refills | Status: DC

## 2022-03-08 NOTE — Telephone Encounter (Signed)
Called pt in regards to [facility] faxing [place] back about our request for her Diabetic eye exam they wrote:  "Please Call to schedule DM exam. March 27th will be her last postop for 03-03-22 cataract surgery on left eye; cataract surgery right eye 02-03-22"   Pt stated she will talk to them about it on March 27th as she would like to stay with [facility] after her f/u to have her normal eye exams.

## 2022-03-30 DIAGNOSIS — Z961 Presence of intraocular lens: Secondary | ICD-10-CM | POA: Diagnosis not present

## 2022-04-12 ENCOUNTER — Telehealth: Payer: Self-pay | Admitting: Nurse Practitioner

## 2022-04-12 NOTE — Telephone Encounter (Signed)
Contacted Holly Navarro to schedule their annual wellness visit. Appointment made for 04/15/2022.  Thank you,  [facility] Support [facility] Direct dial  [phone]

## 2022-04-15 ENCOUNTER — Ambulatory Visit (INDEPENDENT_AMBULATORY_CARE_PROVIDER_SITE_OTHER): Payer: Medicare Other

## 2022-04-15 VITALS — Ht 66.0 in | Wt 124.0 lb

## 2022-04-15 DIAGNOSIS — Z0000 Encounter for general adult medical examination without abnormal findings: Secondary | ICD-10-CM

## 2022-04-15 NOTE — Patient Instructions (Addendum)
Holly Navarro , Thank you for taking time to come for your Medicare Wellness Visit. I appreciate your ongoing commitment to your health goals. Please review the following plan we discussed and let me know if I can assist you in the future.   These are the goals we discussed:  Goals      Increase physical activity     Walk more for exercise        This is a list of the screening recommended for you and due dates:  Health Maintenance  Topic Date Due   DTaP/Tdap/Td vaccine (1 - Tdap) Never done   Complete foot exam   12/11/2021   Yearly kidney health urinalysis for diabetes  04/23/2022   Zoster (Shingles) Vaccine (1 of 2) 04/29/2022*   COVID-19 Vaccine (5 - 2023-24 season) 05/01/2022*   Pneumonia Vaccine (1 of 2 - PCV) 01/29/2023*   DEXA scan (bone density measurement)  01/29/2023*   Hemoglobin A1C  04/27/2022   Flu Shot  08/04/2022   Yearly kidney function blood test for diabetes  10/27/2022   Eye exam for diabetics  12/17/2022   Medicare Annual Wellness Visit  04/15/2023   Hepatitis C Screening: USPSTF Recommendation to screen - Ages 18-79 yo.  Completed   HPV Vaccine  Aged Out  *Topic was postponed. The date shown is not the original due date.    Advanced directives: on file  Conditions/risks identified: none new  Next appointment: Follow up in one year for your annual wellness visit    Preventive Care 65 Years and Older, Female Preventive care refers to lifestyle choices and visits with your health care provider that can promote health and wellness. What does preventive care include? A yearly physical exam. This is also called an annual well check. Dental exams once or twice a year. Routine eye exams. Ask your health care provider how often you should have your eyes checked. Personal lifestyle choices, including: Daily care of your teeth and gums. Regular physical activity. Eating a healthy diet. Avoiding tobacco and drug use. Limiting alcohol use. Practicing safe  sex. Taking low-dose aspirin every day. Taking vitamin and mineral supplements as recommended by your health care provider. What happens during an annual well check? The services and screenings done by your health care provider during your annual well check will depend on your age, overall health, lifestyle risk factors, and family history of disease. Counseling  Your health care provider may ask you questions about your: Alcohol use. Tobacco use. Drug use. Emotional well-being. Home and relationship well-being. Sexual activity. Eating habits. History of falls. Memory and ability to understand (cognition). Work and work Astronomer. Reproductive health. Screening  You may have the following tests or measurements: Height, weight, and BMI. Blood pressure. Lipid and cholesterol levels. These may be checked every 5 years, or more frequently if you are over 34 years old. Skin check. Lung cancer screening. You may have this screening every year starting at age 40 if you have a 30-pack-year history of smoking and currently smoke or have quit within the past 15 years. Fecal occult blood test (FOBT) of the stool. You may have this test every year starting at age 71. Flexible sigmoidoscopy or colonoscopy. You may have a sigmoidoscopy every 5 years or a colonoscopy every 10 years starting at age 109. Hepatitis C blood test. Hepatitis B blood test. Sexually transmitted disease (STD) testing. Diabetes screening. This is done by checking your blood sugar (glucose) after you have not eaten for a while (  fasting). You may have this done every 1-3 years. Bone density scan. This is done to screen for osteoporosis. You may have this done starting at age 5. Mammogram. This may be done every 1-2 years. Talk to your health care provider about how often you should have regular mammograms. Talk with your health care provider about your test results, treatment options, and if necessary, the need for more  tests. Vaccines  Your health care provider may recommend certain vaccines, such as: Influenza vaccine. This is recommended every year. Tetanus, diphtheria, and acellular pertussis (Tdap, Td) vaccine. You may need a Td booster every 10 years. Zoster vaccine. You may need this after age 59. Pneumococcal 13-valent conjugate (PCV13) vaccine. One dose is recommended after age 64. Pneumococcal polysaccharide (PPSV23) vaccine. One dose is recommended after age 74. Talk to your health care provider about which screenings and vaccines you need and how often you need them. This information is not intended to replace advice given to you by your health care provider. Make sure you discuss any questions you have with your health care provider. Document Released: 01/16/2015 Document Revised: 09/09/2015 Document Reviewed: 10/21/2014 Elsevier Interactive Patient Education  2017 [place] Prevention in the Home Falls can cause injuries. They can happen to people of all ages. There are many things you can do to make your home safe and to help prevent falls. What can I do on the outside of my home? Regularly fix the edges of walkways and driveways and fix any cracks. Remove anything that might make you trip as you walk through a door, such as a raised step or threshold. Trim any bushes or trees on the path to your home. Use bright outdoor lighting. Clear any walking paths of anything that might make someone trip, such as rocks or tools. Regularly check to see if handrails are loose or broken. Make sure that both sides of any steps have handrails. Any raised decks and porches should have guardrails on the edges. Have any leaves, snow, or ice cleared regularly. Use sand or salt on walking paths during winter. Clean up any spills in your garage right away. This includes oil or grease spills. What can I do in the bathroom? Use night lights. Install grab bars by the toilet and in the tub and shower.  Do not use towel bars as grab bars. Use non-skid mats or decals in the tub or shower. If you need to sit down in the shower, use a plastic, non-slip stool. Keep the floor dry. Clean up any water that spills on the floor as soon as it happens. Remove soap buildup in the tub or shower regularly. Attach bath mats securely with double-sided non-slip rug tape. Do not have throw rugs and other things on the floor that can make you trip. What can I do in the bedroom? Use night lights. Make sure that you have a light by your bed that is easy to reach. Do not use any sheets or blankets that are too big for your bed. They should not hang down onto the floor. Have a firm chair that has side arms. You can use this for support while you get dressed. Do not have throw rugs and other things on the floor that can make you trip. What can I do in the kitchen? Clean up any spills right away. Avoid walking on wet floors. Keep items that you use a lot in easy-to-reach places. If you need to reach something above you, use  a strong step stool that has a grab bar. Keep electrical cords out of the way. Do not use floor polish or wax that makes floors slippery. If you must use wax, use non-skid floor wax. Do not have throw rugs and other things on the floor that can make you trip. What can I do with my stairs? Do not leave any items on the stairs. Make sure that there are handrails on both sides of the stairs and use them. Fix handrails that are broken or loose. Make sure that handrails are as long as the stairways. Check any carpeting to make sure that it is firmly attached to the stairs. Fix any carpet that is loose or worn. Avoid having throw rugs at the top or bottom of the stairs. If you do have throw rugs, attach them to the floor with carpet tape. Make sure that you have a light switch at the top of the stairs and the bottom of the stairs. If you do not have them, ask someone to add them for you. What else  can I do to help prevent falls? Wear shoes that: Do not have high heels. Have rubber bottoms. Are comfortable and fit you well. Are closed at the toe. Do not wear sandals. If you use a stepladder: Make sure that it is fully opened. Do not climb a closed stepladder. Make sure that both sides of the stepladder are locked into place. Ask someone to hold it for you, if possible. Clearly mark and make sure that you can see: Any grab bars or handrails. First and last steps. Where the edge of each step is. Use tools that help you move around (mobility aids) if they are needed. These include: Canes. Walkers. Scooters. Crutches. Turn on the lights when you go into a dark area. Replace any light bulbs as soon as they burn out. Set up your furniture so you have a clear path. Avoid moving your furniture around. If any of your floors are uneven, fix them. If there are any pets around you, be aware of where they are. Review your medicines with your doctor. Some medicines can make you feel dizzy. This can increase your chance of falling. Ask your doctor what other things that you can do to help prevent falls. This information is not intended to replace advice given to you by your health care provider. Make sure you discuss any questions you have with your health care provider. Document Released: 10/16/2008 Document Revised: 05/28/2015 Document Reviewed: 01/24/2014 Elsevier Interactive Patient Education  2017 [employer].

## 2022-04-15 NOTE — Progress Notes (Signed)
Subjective:   Holly Navarro is a 77 y.o. female who presents for Medicare Annual (Subsequent) preventive examination.  Review of Systems    No ROS.  Medicare Wellness Virtual Visit.  Visual/audio telehealth visit, UTA vital signs.   See social history for additional risk factors.   Cardiac Risk Factors include: advanced age (>75men, >65 women)     Objective:    Today's Vitals   04/15/22 1248  Weight: 124 lb (56.2 kg)  Height: 5\' 6"  (1.676 m)   Body mass index is 20.01 kg/m.     04/15/2022   12:53 PM 03/03/2022   10:57 AM 02/03/2022   12:17 PM 01/18/2022    6:59 AM 06/10/2021    1:41 PM 02/25/2021    7:02 AM 06/18/2020    2:51 PM  Advanced Directives  Does Patient Have a Medical Advance Directive? Yes Yes Yes Yes Yes Yes No  Type of Estate agent of [place];Living will Healthcare Power of [place];Living will Healthcare Power of [place];Living will  Healthcare Power of [place];Living will Living will   Does patient want to make changes to medical advance directive? No - Patient declined No - Patient declined No - Patient declined  No - Patient declined    Copy of Healthcare Power of Attorney in Chart? Yes - validated most recent copy scanned in chart (See row information) No - copy requested No - copy requested      Would patient like information on creating a medical advance directive?      No - Patient declined No - Patient declined    Current Medications (verified) Outpatient Encounter Medications as of 04/15/2022  Medication Sig   atenolol (TENORMIN) 50 MG tablet Take 1 tablet (50 mg total) by mouth daily.   blood glucose meter kit and supplies KIT Dispense based on patient and insurance preference. Use up to four times daily as directed.   cimetidine (TAGAMET) 200 MG tablet Take 200 mg by mouth 2 (two) times daily as needed (indigestion/heartburn.).   Coenzyme Q10 (COQ10) 100 MG CAPS Take 100 mg by mouth in the morning.   diphenhydrAMINE (BENADRYL)  25 mg capsule Take 25 mg by mouth every 6 (six) hours as needed for allergies.   ECHINACEA-GOLDENSEAL PO Take 1 capsule by mouth in the morning and at bedtime.   fluticasone (FLONASE) 50 MCG/ACT nasal spray Place 2 sprays into both nostrils daily as needed.   insulin degludec (TRESIBA FLEXTOUCH) 100 UNIT/ML FlexTouch Pen Inject 15 Units into the skin daily. Adjust dose as advised.   Insulin Pen Needle (NOVOFINE PEN NEEDLE) 32G X 6 MM MISC Use to inject insulin daily as directed.   lipase/protease/amylase (CREON) 36000 UNITS CPEP capsule Take 2 capsules (72,000 Units total) by mouth 3 (three) times daily with meals. May also take 1 capsule (36,000 Units total) as needed (with snacks).   lisinopril (ZESTRIL) 20 MG tablet Take 1 tablet (20 mg total) by mouth daily.   MILK THISTLE PO Take 1 tablet by mouth in the morning and at bedtime.   Multiple Vitamins-Minerals (MULTIVITAMIN ADULTS 50+) TABS Take 1 tablet by mouth daily in the afternoon.   pantoprazole (PROTONIX) 40 MG tablet Take 1 tablet (40 mg total) by mouth 2 (two) times daily before a meal.   rosuvastatin (CRESTOR) 10 MG tablet Take 1 tablet (10 mg total) by mouth daily.   simethicone (MYLICON) 80 MG chewable tablet Chew 80 mg by mouth in the morning, at noon, in the evening, and at  bedtime.   Simethicone 125 MG CAPS Take 125 mg by mouth in the morning, at noon, in the evening, and at bedtime.   No facility-administered encounter medications on file as of 04/15/2022.    Allergies (verified) Fentanyl and Morphine and related   History: Past Medical History:  Diagnosis Date   Anemia    Anxiety    Arthritis    Cardiac arrhythmia    Diabetes mellitus without complication    Hyperlipidemia    Hypertension    Necrotizing pancreatitis 02/2020   Pancreatic pseudocyst    Seizure    pt doesnt remember but was told had one in hospital 2022   Stomach ulcer    Wears dentures    full upper, partial lower   Past Surgical History:   Procedure Laterality Date   BIOPSY  05/20/2020   Procedure: BIOPSY;  Surgeon: Lemar Lofty., MD;  Location: [facility];  Service: Gastroenterology;;   BIOPSY  02/25/2021   Procedure: BIOPSY;  Surgeon: Lemar Lofty., MD;  Location: [facility] END[facility];  Service: Gastroenterology;;   BIOPSY  06/24/2021   Procedure: BIOPSY;  Surgeon: Lemar Lofty., MD;  Location: [facility] ENDOSCOPY;  Service: Gastroenterology;;   CATARACT EXTRACTION W/PHACO Right 02/03/2022   Procedure: CATARACT EXTRACTION PHACO AND INTRAOCULAR LENS PLACEMENT (IOC) RIGHT DIABETIC  9.42  01:08.4;  Surgeon: Estanislado Pandy, MD;  Location: [facility];  Service: Ophthalmology;  Laterality: Right;  Diabetic   CATARACT EXTRACTION W/PHACO Left 03/03/2022   Procedure: CATARACT EXTRACTION PHACO AND INTRAOCULAR LENS PLACEMENT (IOC) LEFT DIABETIC  12.57  01:09.8;  Surgeon: Estanislado Pandy, MD;  Location: [facility];  Service: Ophthalmology;  Laterality: Left;   CYST GASTROSTOMY  06/24/2021   Procedure: NECROSECTOMY;  Surgeon: Meridee Score Netty Starring., MD;  Location: [facility];  Service: Gastroenterology;;   ESOPHAGOGASTRODUODENOSCOPY N/A 05/20/2020   Procedure: ESOPHAGOGASTRODUODENOSCOPY (EGD);  Surgeon: Lemar Lofty., MD;  Location: [facility];  Service: Gastroenterology;  Laterality: N/A;   ESOPHAGOGASTRODUODENOSCOPY N/A 01/18/2022   Procedure: ESOPHAGOGASTRODUODENOSCOPY (EGD);  Surgeon: Lemar Lofty., MD;  Location: [facility];  Service: Gastroenterology;  Laterality: N/A;   ESOPHAGOGASTRODUODENOSCOPY (EGD) WITH PROPOFOL N/A 02/25/2021   Procedure: ESOPHAGOGASTRODUODENOSCOPY (EGD) WITH PROPOFOL;  Surgeon: Meridee Score Netty Starring., MD;  Location: [facility] ENDOSCOPY;  Service: Gastroenterology;  Laterality: N/A;   ESOPHAGOGASTRODUODENOSCOPY (EGD) WITH PROPOFOL N/A 06/24/2021   Procedure: ESOPHAGOGASTRODUODENOSCOPY (EGD) WITH PROPOFOL;  Surgeon: Meridee Score Netty Starring., MD;   Location: [facility] ENDOSCOPY;  Service: Gastroenterology;  Laterality: N/A;   EUS N/A 01/18/2022   Procedure: UPPER ENDOSCOPIC ULTRASOUND (EUS) LINEAR;  Surgeon: Lemar Lofty., MD;  Location: [facility] ENDOSCOPY;  Service: Gastroenterology;  Laterality: N/A;   FINE NEEDLE ASPIRATION N/A 01/18/2022   Procedure: FINE NEEDLE ASPIRATION (FNA) LINEAR;  Surgeon: Lemar Lofty., MD;  Location: [facility] ENDOSCOPY;  Service: Gastroenterology;  Laterality: N/A;   FOREIGN BODY REMOVAL  06/24/2021   Procedure: FOREIGN BODY REMOVAL;  Surgeon: Meridee Score Netty Starring., MD;  Location: [facility] ENDOSCOPY;  Service: Gastroenterology;;   LAPAROSCOPIC OVARIAN CYSTECTOMY     STENT REMOVAL  06/24/2021   Procedure: STENT REMOVAL;  Surgeon: Lemar Lofty., MD;  Location: [facility] ENDOSCOPY;  Service: Gastroenterology;;   TONSILLECTOMY     UPPER ESOPHAGEAL ENDOSCOPIC ULTRASOUND (EUS) N/A 05/20/2020   Procedure: UPPER ESOPHAGEAL ENDOSCOPIC ULTRASOUND (EUS)  ;  Surgeon: Lemar Lofty., MD;  Location: [facility];  Service: Gastroenterology;  Laterality: N/A;   UPPER ESOPHAGEAL ENDOSCOPIC ULTRASOUND (EUS) N/A 02/25/2021   Procedure: UPPER ESOPHAGEAL ENDOSCOPIC ULTRASOUND (EUS);  Surgeon: Lemar Lofty., MD;  Location: [facility] ENDOSCOPY;  Service: Gastroenterology;  Laterality: N/A;   Family History  Problem Relation Age of Onset   Heart disease Mother    Kidney disease Mother    Diabetes Father    Pancreatic cancer Brother        Agent Orange   Gallstones Daughter    Colon cancer Son 62   Diabetes Paternal Uncle        x 2   Esophageal cancer Neg Hx    Inflammatory bowel disease Neg Hx    Liver disease Neg Hx    Stomach cancer Neg Hx    Social History   Socioeconomic History   Marital status: Widowed    Spouse name: Not on file   Number of children: 4   Years of education: Not on file   Highest education level: Not on file  Occupational History   Occupation: retired  Tobacco Use   Smoking status:  Every Day    Packs/day: 0.25    Years: 60.00    Additional pack years: 0.00    Total pack years: 15.00    Types: Cigarettes   Smokeless tobacco: Never   Tobacco comments:    0.25 a pack a day (started age 44)  Vaping Use   Vaping Use: Never used  Substance and Sexual Activity   Alcohol use: Not Currently   Drug use: No   Sexual activity: Not on file  Other Topics Concern   Not on file  Social History Narrative   Not on file   Social Determinants of Health   Financial Resource Strain: Low Risk  (04/12/2022)   Overall Financial Resource Strain (CARDIA)    Difficulty of Paying Living Expenses: Not hard at all  Food Insecurity: No Food Insecurity (04/12/2022)   Hunger Vital Sign    Worried About Running Out of Food in the Last Year: Never true    Ran Out of Food in the Last Year: Never true  Transportation Needs: No Transportation Needs (04/12/2022)   PRAPARE - Administrator, Civil Service (Medical): No    Lack of Transportation (Non-Medical): No  Physical Activity: Insufficiently Active (04/12/2022)   Exercise Vital Sign    Days of Exercise per Week: 4 days    Minutes of Exercise per Session: 20 min  Stress: No Stress Concern Present (04/12/2022)   [employer] of Occupational Health - Occupational Stress Questionnaire    Feeling of Stress : Only a little  Social Connections: Unknown (04/12/2022)   Social Connection and Isolation Panel [NHANES]    Frequency of Communication with Friends and Family: More than three times a week    Frequency of Social Gatherings with Friends and Family: Once a week    Attends Religious Services: Not on Marketing executive or Organizations: Yes    Attends Banker Meetings: More than 4 times per year    Marital Status: Widowed    Tobacco Counseling Ready to quit: Not Answered Counseling given: Not Answered Tobacco comments: 0.25 a pack a day (started age 51)   Clinical Intake:  Pre-visit preparation  completed: Yes       Nutrition Risk Assessment: Has the patient had any N/V/D within the last 2 months?  No  Does the patient have any non-healing wounds?  No  Has the patient had any unintentional weight loss or weight gain?  No   Diabetes: Is the patient diabetic?  Yes  If diabetic, was a CBG obtained today?  Yes , FBS 118 Did the patient bring in their glucometer from home?  No  How often do you monitor your CBG's? daily.   Financial Strains and Diabetes Management: Are you having any financial strains with the device, your supplies or your medication? No .  Does the patient want to be seen by Chronic Care Management for management of their diabetes?  No  Would the patient like to be referred to a Nutritionist or for Diabetic Management?  No     Diabetes: Yes (Followed by pcp)  How often do you need to have someone help you when you read instructions, pamphlets, or other written materials from your doctor or pharmacy?: 1 - Never    Interpreter Needed?: No      Activities of Daily Living    04/12/2022    7:15 PM 03/03/2022   10:52 AM  In your present state of health, do you have any difficulty performing the following activities:  Hearing? 0 0  Vision? 0 0  Difficulty concentrating or making decisions? 0 0  Walking or climbing stairs? 0 0  Dressing or bathing? 0 0  Doing errands, shopping? 0   Preparing Food and eating ? N   Using the Toilet? N   In the past six months, have you accidently leaked urine? N   Do you have problems with loss of bowel control? N   Managing your Medications? N   Managing your Finances? N   Housekeeping or managing your Housekeeping? N     Patient Care Team: [name], NP as PCP - General (Nurse Practitioner)  Indicate any recent Medical Services you may have received from other than [facility] providers in the past year (date may be approximate).     Assessment:   This is a routine wellness examination for Holly Navarro.  Patient  Medicare AWV questionnaire was completed by the patient on 04/12/22, I have confirmed that all information answered by patient is correct and no changes since this date.   I connected with  [name] on 04/15/22 by a audio enabled telemedicine application and verified that I am speaking with the correct person using two identifiers.  Patient Location: Home  Provider Location: Office/Clinic  I discussed the limitations of evaluation and management by telemedicine. The patient expressed understanding and agreed to proceed.   Hearing/Vision screen Hearing Screening - Comments:: Patient is able to hear conversational tones without difficulty.  No issues reported.   Vision Screening - Comments:: Followed by [facility] Cataract extraction, bilateral They have seen their ophthalmologist in the last 12 months.    Dietary issues and exercise activities discussed: Current Exercise Habits: Home exercise routine, Type of exercise: calisthenics;walking, Time (Minutes): 20, Frequency (Times/Week): 4, Weekly Exercise (Minutes/Week): 80, Intensity: Mild Healthy diet   Goals Addressed             This Visit's Progress    Increase physical activity       Walk more for exercise       Depression Screen    04/15/2022   12:53 PM 01/28/2022   10:06 AM 10/26/2021    2:20 PM 06/10/2021    1:40 PM 04/22/2021    3:50 PM 12/11/2020    2:25 PM 12/11/2019   11:25 AM  PHQ 2/9 Scores  PHQ - 2 Score 0 0 1 0 0 0 0  PHQ- 9 Score  1 2  1  Fall Risk    04/12/2022    7:15 PM 01/28/2022   10:05 AM 10/26/2021    2:20 PM 06/29/2021   10:25 AM 06/10/2021    1:40 PM  Fall Risk   Falls in the past year? 0 0 0 0 0  Number falls in past yr: 0 0 0    Injury with Fall? 0 0 0    Risk for fall due to :  No Fall Risks No Fall Risks    Follow up Falls evaluation completed;Falls prevention discussed Falls evaluation completed Falls evaluation completed      FALL RISK PREVENTION PERTAINING TO THE  HOME: Home free of loose throw rugs in walkways, pet beds, electrical cords, etc? Yes  Adequate lighting in your home to reduce risk of falls? Yes   ASSISTIVE DEVICES UTILIZED TO PREVENT FALLS: Life alert? No  Use of a cane, walker or w/c? No  Grab bars in the bathroom? Yes  Shower chair or bench in shower? Yes  Elevated toilet seat or a handicapped toilet? No   TIMED UP AND GO: Was the test performed? No .    Cognitive Function:        04/15/2022   12:59 PM  6CIT Screen  What Year? 0 points  What month? 0 points  What time? 0 points  Count back from 20 0 points  Months in reverse 0 points  Repeat phrase 0 points  Total Score 0 points    Immunizations Immunization History  Administered Date(s) Administered   Fluad Quad(high Dose 65+) 09/17/2018, 12/11/2020, 10/26/2021   Influenza, High Dose Seasonal PF 11/03/2014, 11/23/2016, 12/04/2017   Influenza-Unspecified 11/20/2019   PFIZER Comirnaty(Gray Top)Covid-19 Tri-Sucrose Vaccine 08/01/2020   PFIZER(Purple Top)SARS-COV-2 Vaccination 02/20/2019, 03/13/2019, 12/10/2019    TDAP status: Due, Education has been provided regarding the importance of this vaccine. Advised may receive this vaccine at local pharmacy or Health Dept. Aware to provide a copy of the vaccination record if obtained from local pharmacy or Health Dept. Verbalized acceptance and understanding.  Screening Tests Health Maintenance  Topic Date Due   DTaP/Tdap/Td (1 - Tdap) Never done   FOOT EXAM  12/11/2021   Diabetic kidney evaluation - Urine ACR  04/23/2022   Zoster Vaccines- Shingrix (1 of 2) 04/29/2022 (Originally 04/01/1964)   COVID-19 Vaccine (5 - 2023-24 season) 05/01/2022 (Originally 09/03/2021)   Pneumonia Vaccine 55+ Years old (1 of 2 - PCV) 01/29/2023 (Originally 04/02/1951)   DEXA SCAN  01/29/2023 (Originally 04/02/2010)   HEMOGLOBIN A1C  04/27/2022   INFLUENZA VACCINE  08/04/2022   Diabetic kidney evaluation - eGFR measurement  10/27/2022    OPHTHALMOLOGY EXAM  12/17/2022   Medicare Annual Wellness (AWV)  04/15/2023   Hepatitis C Screening  Completed   HPV VACCINES  Aged Out    Health Maintenance Health Maintenance Due  Topic Date Due   DTaP/Tdap/Td (1 - Tdap) Never done   FOOT EXAM  12/11/2021   Diabetic kidney evaluation - Urine ACR  04/23/2022   Hepatitis C Screening: Completed 03/2018  Vision Screening: Recommended annual ophthalmology exams for early detection of glaucoma and other disorders of the eye.  Dental Screening: Recommended annual dental exams for proper oral hygiene  Community Resource Referral / Chronic Care Management: CRR required this visit?  No   CCM required this visit?  No      Plan:     I have personally reviewed and noted the following in the patient's chart:   Medical and social history Use  of alcohol, tobacco or illicit drugs  Current medications and supplements including opioid prescriptions. Patient is not currently taking opioid prescriptions. Functional ability and status Nutritional status Physical activity Advanced directives List of other physicians Hospitalizations, surgeries, and ER visits in previous 12 months Vitals Screenings to include cognitive, depression, and falls Referrals and appointments  In addition, I have reviewed and discussed with patient certain preventive protocols, quality metrics, and best practice recommendations. A written personalized care plan for preventive services as well as general preventive health recommendations were provided to patient.     Cathey Endow, LPN   1/61/0960

## 2022-04-25 ENCOUNTER — Other Ambulatory Visit (INDEPENDENT_AMBULATORY_CARE_PROVIDER_SITE_OTHER): Payer: Medicare Other

## 2022-04-25 DIAGNOSIS — E119 Type 2 diabetes mellitus without complications: Secondary | ICD-10-CM

## 2022-04-25 DIAGNOSIS — I10 Essential (primary) hypertension: Secondary | ICD-10-CM

## 2022-04-25 DIAGNOSIS — Z1329 Encounter for screening for other suspected endocrine disorder: Secondary | ICD-10-CM | POA: Diagnosis not present

## 2022-04-25 DIAGNOSIS — Z0000 Encounter for general adult medical examination without abnormal findings: Secondary | ICD-10-CM | POA: Diagnosis not present

## 2022-04-25 DIAGNOSIS — Z794 Long term (current) use of insulin: Secondary | ICD-10-CM | POA: Diagnosis not present

## 2022-04-25 DIAGNOSIS — E782 Mixed hyperlipidemia: Secondary | ICD-10-CM | POA: Diagnosis not present

## 2022-04-25 LAB — CBC WITH DIFFERENTIAL/PLATELET
Basophils Absolute: 0.1 10*3/uL (ref 0.0–0.1)
Basophils Relative: 0.9 % (ref 0.0–3.0)
Eosinophils Absolute: 0.2 10*3/uL (ref 0.0–0.7)
Eosinophils Relative: 2.6 % (ref 0.0–5.0)
HCT: 50.5 % — ABNORMAL HIGH (ref 36.0–46.0)
Hemoglobin: 17 g/dL — ABNORMAL HIGH (ref 12.0–15.0)
Lymphocytes Relative: 38.8 % (ref 12.0–46.0)
Lymphs Abs: 2.9 10*3/uL (ref 0.7–4.0)
MCHC: 33.6 g/dL (ref 30.0–36.0)
MCV: 90 fl (ref 78.0–100.0)
Monocytes Absolute: 0.7 10*3/uL (ref 0.1–1.0)
Monocytes Relative: 9.1 % (ref 3.0–12.0)
Neutro Abs: 3.7 10*3/uL (ref 1.4–7.7)
Neutrophils Relative %: 48.6 % (ref 43.0–77.0)
Platelets: 203 10*3/uL (ref 150.0–400.0)
RBC: 5.61 Mil/uL — ABNORMAL HIGH (ref 3.87–5.11)
RDW: 13.5 % (ref 11.5–15.5)
WBC: 7.6 10*3/uL (ref 4.0–10.5)

## 2022-04-25 LAB — COMPREHENSIVE METABOLIC PANEL
ALT: 16 U/L (ref 0–35)
AST: 21 U/L (ref 0–37)
Albumin: 4.4 g/dL (ref 3.5–5.2)
Alkaline Phosphatase: 87 U/L (ref 39–117)
BUN: 16 mg/dL (ref 6–23)
CO2: 29 mEq/L (ref 19–32)
Calcium: 10.2 mg/dL (ref 8.4–10.5)
Chloride: 102 mEq/L (ref 96–112)
Creatinine, Ser: 1.24 mg/dL — ABNORMAL HIGH (ref 0.40–1.20)
GFR: 42.11 mL/min — ABNORMAL LOW (ref >60.00)
Glucose, Bld: 100 mg/dL — ABNORMAL HIGH (ref 70–99)
Potassium: 3.7 mEq/L (ref 3.5–5.1)
Sodium: 141 mEq/L (ref 135–145)
Total Bilirubin: 0.6 mg/dL (ref 0.2–1.2)
Total Protein: 7.5 g/dL (ref 6.0–8.3)

## 2022-04-25 LAB — LIPID PANEL
Cholesterol: 216 mg/dL — ABNORMAL HIGH (ref 0–200)
HDL: 45 mg/dL (ref >39.00)
NonHDL: 170.69
Total CHOL/HDL Ratio: 5
Triglycerides: 204 mg/dL — ABNORMAL HIGH (ref 0.0–149.0)
VLDL: 40.8 mg/dL — ABNORMAL HIGH (ref 0.0–40.0)

## 2022-04-25 LAB — TSH: TSH: 3.88 u[IU]/mL (ref 0.35–5.50)

## 2022-04-25 LAB — LDL CHOLESTEROL, DIRECT: Direct LDL: 124 mg/dL

## 2022-04-25 LAB — HEMOGLOBIN A1C: Hgb A1c MFr Bld: 12.2 % — ABNORMAL HIGH (ref 4.6–6.5)

## 2022-04-26 ENCOUNTER — Ambulatory Visit: Payer: Medicare Other | Admitting: Gastroenterology

## 2022-04-29 ENCOUNTER — Encounter: Payer: Self-pay | Admitting: Nurse Practitioner

## 2022-04-29 ENCOUNTER — Ambulatory Visit (INDEPENDENT_AMBULATORY_CARE_PROVIDER_SITE_OTHER): Payer: Medicare Other | Admitting: Nurse Practitioner

## 2022-04-29 VITALS — BP 128/82 | HR 64 | Temp 98.2°F | Ht 66.0 in | Wt 126.0 lb

## 2022-04-29 DIAGNOSIS — E782 Mixed hyperlipidemia: Secondary | ICD-10-CM | POA: Diagnosis not present

## 2022-04-29 DIAGNOSIS — I10 Essential (primary) hypertension: Secondary | ICD-10-CM | POA: Diagnosis not present

## 2022-04-29 DIAGNOSIS — Z794 Long term (current) use of insulin: Secondary | ICD-10-CM | POA: Diagnosis not present

## 2022-04-29 DIAGNOSIS — I129 Hypertensive chronic kidney disease with stage 1 through stage 4 chronic kidney disease, or unspecified chronic kidney disease: Secondary | ICD-10-CM | POA: Diagnosis not present

## 2022-04-29 DIAGNOSIS — E1165 Type 2 diabetes mellitus with hyperglycemia: Secondary | ICD-10-CM | POA: Diagnosis not present

## 2022-04-29 DIAGNOSIS — N1832 Chronic kidney disease, stage 3b: Secondary | ICD-10-CM | POA: Diagnosis not present

## 2022-04-29 MED ORDER — TRESIBA FLEXTOUCH 100 UNIT/ML ~~LOC~~ SOPN: 20.0000 [IU] | PEN_INJECTOR | Freq: Every day | SUBCUTANEOUS | 3 refills | Status: DC

## 2022-04-29 NOTE — Progress Notes (Unsigned)
Holly Dicker, NP-C Phone: [phone]  Holly Navarro is a 77 y.o. female who presents today for follow up.   HYPERTENSION Disease Monitoring: Blood pressure range- Not checking Chest pain- No      Dyspnea- No Medications: Compliance- Atenolol and Lisinopril Lightheadedness- No   Edema- No  Lab Results  Component Value Date   NA 141 04/25/2022   K 3.7 04/25/2022   CO2 29 04/25/2022   GLUCOSE 100 (H) 04/25/2022   BUN 16 04/25/2022   CREATININE 1.24 (H) 04/25/2022   CALCIUM 10.2 04/25/2022   EGFR 46 (L) 10/26/2021   GFRNONAA 48 (L) 10/19/2021    DIABETES Disease Monitoring: Blood Sugar ranges- 80-120 in the morning, 300s during the day Polyuria/phagia/dipsia- No      Optho- Yes Medications: Compliance- Evaristo Bury Hypoglycemic symptoms- No  Lab Results  Component Value Date   HGBA1C 12.2 (H) 04/25/2022     HYPERLIPIDEMIA Disease Monitoring: See symptoms for Hypertension Medications: Compliance- Crestor Right upper quadrant pain- No  Muscle aches- No The 10-year ASCVD risk score (Arnett DK, et al., 2019) is: 54.8% Lab Results  Component Value Date   CHOL 216 (H) 04/25/2022   HDL 45.00 04/25/2022   LDLCALC 151 (H) 10/26/2021   LDLDIRECT 124.0 04/25/2022   TRIG 204.0 (H) 04/25/2022   CHOLHDL 5 04/25/2022      Social History   Tobacco Use  Smoking Status Every Day   Packs/day: 0.25   Years: 60.00   Additional pack years: 0.00   Total pack years: 15.00   Types: Cigarettes  Smokeless Tobacco Never  Tobacco Comments   0.25 a pack a day (started age 77)    Current Outpatient Medications on File Prior to Visit  Medication Sig Dispense Refill   atenolol (TENORMIN) 50 MG tablet Take 1 tablet (50 mg total) by mouth daily. 90 tablet 3   blood glucose meter kit and supplies KIT Dispense based on patient and insurance preference. Use up to four times daily as directed. 1 each 0   cimetidine (TAGAMET) 200 MG tablet Take 200 mg by mouth 2 (two) times daily as needed  (indigestion/heartburn.).     Coenzyme Q10 (COQ10) 100 MG CAPS Take 100 mg by mouth in the morning.     diphenhydrAMINE (BENADRYL) 25 mg capsule Take 25 mg by mouth every 6 (six) hours as needed for allergies.     ECHINACEA-GOLDENSEAL PO Take 1 capsule by mouth in the morning and at bedtime.     fluticasone (FLONASE) 50 MCG/ACT nasal spray Place 2 sprays into both nostrils daily as needed. 16 g 1   Insulin Pen Needle (NOVOFINE PEN NEEDLE) 32G X 6 MM MISC Use to inject insulin daily as directed. 90 each 1   lipase/protease/amylase (CREON) 36000 UNITS CPEP capsule Take 2 capsules (72,000 Units total) by mouth 3 (three) times daily with meals. May also take 1 capsule (36,000 Units total) as needed (with snacks). 720 capsule 2   lisinopril (ZESTRIL) 20 MG tablet Take 1 tablet (20 mg total) by mouth daily. 90 tablet 3   MILK THISTLE PO Take 1 tablet by mouth in the morning and at bedtime.     Multiple Vitamins-Minerals (MULTIVITAMIN ADULTS 50+) TABS Take 1 tablet by mouth daily in the afternoon.     pantoprazole (PROTONIX) 40 MG tablet Take 1 tablet (40 mg total) by mouth 2 (two) times daily before a meal. 180 tablet 1   rosuvastatin (CRESTOR) 10 MG tablet Take 1 tablet (10 mg total) by mouth  daily. 90 tablet 3   simethicone (MYLICON) 80 MG chewable tablet Chew 80 mg by mouth in the morning, at noon, in the evening, and at bedtime.     Simethicone 125 MG CAPS Take 125 mg by mouth in the morning, at noon, in the evening, and at bedtime.     No current facility-administered medications on file prior to visit.     ROS see history of present illness  Objective  Physical Exam Vitals:   04/29/22 1456  BP: 128/82  Pulse: 64  Temp: 98.2 F (36.8 C)  SpO2: 98%    BP Readings from Last 3 Encounters:  04/29/22 128/82  03/03/22 126/75  02/03/22 (!) 161/88   Wt Readings from Last 3 Encounters:  04/29/22 126 lb (57.2 kg)  04/15/22 124 lb (56.2 kg)  03/03/22 122 lb (55.3 kg)    Physical  Exam Constitutional:      General: She is not in acute distress.    Appearance: Normal appearance.  HENT:     Head: Normocephalic.  Cardiovascular:     Rate and Rhythm: Normal rate and regular rhythm.     Heart sounds: Normal heart sounds.  Pulmonary:     Effort: Pulmonary effort is normal.     Breath sounds: Normal breath sounds.  Skin:    General: Skin is warm and dry.  Neurological:     General: No focal deficit present.     Mental Status: She is alert.  Psychiatric:        Mood and Affect: Mood normal.        Behavior: Behavior normal.      Assessment/Plan: Please see individual problem list.  Type 2 diabetes mellitus with hyperglycemia, with long-term current use of insulin (HCC) Assessment & Plan: Uncontrolled. A1c- 12.2. Patient has been taking [place] 15 u daily every morning. Will increase to 20 u daily and encouraged patient to take at bedtime. She has an appointment with Endocrinology in 10 days. Counseled on importance of diabetic diet and getting blood sugars controlled. Education and information provided. Will continue to monitor.   Orders: -     Evaristo Bury FlexTouch; Inject 20 Units into the skin at bedtime.  Dispense: 15 mL; Refill: 3  Essential hypertension Assessment & Plan: Chronic. Stable on Atenolol 50 mg and Lisinopril 20 mg daily. Continue.    Mixed dyslipidemia Assessment & Plan: Chronic. Uncontrolled. Patient started on Crestor in January, she has only been taking half a tablet. Encouraged patient to take full tablet, 10 mg. Counseled on increased risk of heart attack and stroke. The 10-year ASCVD risk score (Arnett DK, et al., 2019) is: 54.8%. Encouraged healthy diet and exercise. Will continue to monitor.    Hypertensive kidney disease with stage 3b chronic kidney disease (HCC) Assessment & Plan: GFR- 42. Encouraged adequate fluid intake. Counseled on importance of glucose and blood pressure control. Continue Lisinopril 20 mg daily. Will continue  to monitor.      Return in about 6 weeks (around 06/10/2022) for Follow up.   Holly Dicker, NP-C [facility] [facility]

## 2022-05-02 ENCOUNTER — Encounter: Payer: Self-pay | Admitting: Nurse Practitioner

## 2022-05-02 DIAGNOSIS — E1165 Type 2 diabetes mellitus with hyperglycemia: Secondary | ICD-10-CM | POA: Insufficient documentation

## 2022-05-02 NOTE — Assessment & Plan Note (Signed)
GFR- 42. Encouraged adequate fluid intake. Counseled on importance of glucose and blood pressure control. Continue Lisinopril 20 mg daily. Will continue to monitor.

## 2022-05-02 NOTE — Assessment & Plan Note (Signed)
Chronic. Uncontrolled. Patient started on Crestor in January, she has only been taking half a tablet. Encouraged patient to take full tablet, 10 mg. Counseled on increased risk of heart attack and stroke. The 10-year ASCVD risk score (Arnett DK, et al., 2019) is: 54.8%. Encouraged healthy diet and exercise. Will continue to monitor.

## 2022-05-02 NOTE — Assessment & Plan Note (Signed)
Uncontrolled. A1c- 12.2. Patient has been taking [place] 15 u daily every morning. Will increase to 20 u daily and encouraged patient to take at bedtime. She has an appointment with Endocrinology in 10 days. Counseled on importance of diabetic diet and getting blood sugars controlled. Education and information provided. Will continue to monitor.

## 2022-05-02 NOTE — Assessment & Plan Note (Signed)
Chronic. Stable on Atenolol 50 mg and Lisinopril 20 mg daily. Continue.

## 2022-05-03 ENCOUNTER — Encounter: Payer: Self-pay | Admitting: Gastroenterology

## 2022-05-03 ENCOUNTER — Ambulatory Visit: Payer: Medicare Other | Admitting: Gastroenterology

## 2022-05-03 VITALS — BP 130/80 | HR 68 | Ht 65.5 in | Wt 126.0 lb

## 2022-05-03 DIAGNOSIS — K862 Cyst of pancreas: Secondary | ICD-10-CM | POA: Diagnosis not present

## 2022-05-03 DIAGNOSIS — K8591 Acute pancreatitis with uninfected necrosis, unspecified: Secondary | ICD-10-CM | POA: Diagnosis not present

## 2022-05-03 DIAGNOSIS — Z8719 Personal history of other diseases of the digestive system: Secondary | ICD-10-CM

## 2022-05-03 NOTE — Patient Instructions (Signed)
You will need CT scan (pancreas protocol ) in Sept. Office will contact you when its time to schedule.   _______________________________________________________  If your blood pressure at your visit was 140/90 or greater, please contact your primary care physician to follow up on this.  _______________________________________________________  If you are age 77 or older, your body mass index should be between 23-30. Your Body mass index is 20.65 kg/m. If this is out of the aforementioned range listed, please consider follow up with your Primary Care Provider.  If you are age 52 or younger, your body mass index should be between 19-25. Your Body mass index is 20.65 kg/m. If this is out of the aformentioned range listed, please consider follow up with your Primary Care Provider.   ________________________________________________________  The [facility] providers would like to encourage you to use [facility] to communicate with providers for non-urgent requests or questions.  Due to long hold times on the telephone, sending your provider a message by [facility] may be a faster and more efficient way to get a response.  Please allow 48 business hours for a response.  Please remember that this is for non-urgent requests.  _______________________________________________________  Thank you for choosing me and [facility].  Dr. Meridee Score

## 2022-05-03 NOTE — Progress Notes (Signed)
GASTROENTEROLOGY OUTPATIENT CLINIC VISIT   Primary Care Provider [name], NP [address] [place] [address] [phone]   Patient Profile: LEONETTE WECKWERTH is a 77 y.o. female with a pmh significant for hypertension, hyperlipidemia anxiety, arthritis peptic ulcer disease, seizures, AAA, insulin-dependent diabetes (secondary to severe pancreatitis), family history colon cancer (son), family history of pancreas cancer (brother), idiopathic pancreatitis in 2022 (complicated pancreatic pseudocyst with walled off necrosis, splenic vein thrombosis-now decompressed and prior necrosectomy but now with recurrent pseudocyst now status post repeat drainage/aspiration).  The patient presents to the [facility] for an evaluation and management of problem(s) noted below:  Problem List 1. History of necrotizing pancreatitis   2. Pancreatic cyst     History of Present Illness Please see prior notes for full details of HPI.  Interval History The patient returns for follow-up.  Overall she has been doing well.  She has not been experiencing significant symptoms or pain or discomfort.  She is hopeful to be heading to the [place] in the summer.  Unfortunately, her hemoglobin A1c has been significantly elevated and is in the 12 range now.  She is working with her primary care provider and will be seeing an endocrinologist in the coming days in regards to further helping her with her insulin needs.  She does describe some low blood sugars with recent adjustment in the morning.  She is not having any other changes in her bowel habits and continuing on her PERT therapy.  GI Review of Systems Positive as above Negative for dysphagia, odynophagia, alteration of bowel habits, melena, hematochezia   Review of Systems General: Denies fevers/chills/weight loss unintentionally Cardiovascular: Denies chest pain/palpitations Pulmonary: Denies shortness of  breath Gastroenterological: See HPI Genitourinary: Denies darkened urine Hematological: Denies easy bruising/bleeding Dermatological: Denies jaundice Psychological: Mood is stable   Medications Current Outpatient Medications  Medication Sig Dispense Refill   atenolol (TENORMIN) 50 MG tablet Take 1 tablet (50 mg total) by mouth daily. 90 tablet 3   blood glucose meter kit and supplies KIT Dispense based on patient and insurance preference. Use up to four times daily as directed. 1 each 0   cimetidine (TAGAMET) 200 MG tablet Take 200 mg by mouth as needed (indigestion/heartburn.).     Coenzyme Q10 (COQ10) 100 MG CAPS Take 100 mg by mouth in the morning.     diphenhydrAMINE (BENADRYL) 25 mg capsule Take 25 mg by mouth every 6 (six) hours as needed for allergies.     ECHINACEA-GOLDENSEAL PO Take 1 capsule by mouth in the morning and at bedtime.     fluticasone (FLONASE) 50 MCG/ACT nasal spray Place 2 sprays into both nostrils daily as needed. 16 g 1   insulin degludec (TRESIBA FLEXTOUCH) 100 UNIT/ML FlexTouch Pen Inject 20 Units into the skin at bedtime. 15 mL 3   Insulin Pen Needle (NOVOFINE PEN NEEDLE) 32G X 6 MM MISC Use to inject insulin daily as directed. 90 each 1   lipase/protease/amylase (CREON) 36000 UNITS CPEP capsule Take 2 capsules (72,000 Units total) by mouth 3 (three) times daily with meals. May also take 1 capsule (36,000 Units total) as needed (with snacks). 720 capsule 2   lisinopril (ZESTRIL) 20 MG tablet Take 1 tablet (20 mg total) by mouth daily. 90 tablet 3   MILK THISTLE PO Take 1 tablet by mouth in the morning and at bedtime.     Multiple Vitamins-Minerals (MULTIVITAMIN ADULTS 50+) TABS Take 1 tablet by mouth daily in the afternoon.  pantoprazole (PROTONIX) 40 MG tablet Take 1 tablet (40 mg total) by mouth 2 (two) times daily before a meal. 180 tablet 1   rosuvastatin (CRESTOR) 10 MG tablet Take 1 tablet (10 mg total) by mouth daily. 90 tablet 3   simethicone  (MYLICON) 80 MG chewable tablet Chew 80 mg by mouth in the morning, at noon, in the evening, and at bedtime.     Simethicone 125 MG CAPS Take 125 mg by mouth in the morning, at noon, in the evening, and at bedtime.     No current facility-administered medications for this visit.    Allergies Allergies  Allergen Reactions   Fentanyl Other (See Comments)    Patient preference, not an allergy    Morphine And Related Other (See Comments)    Patient preference, not an allergy      Histories Past Medical History:  Diagnosis Date   Anemia    Anxiety    Arthritis    Cardiac arrhythmia    Diabetes mellitus without complication (HCC)    Hyperlipidemia    Hypertension    Necrotizing pancreatitis 02/2020   Pancreatic pseudocyst    Seizure (HCC)    pt doesnt remember but was told had one in hospital 2022   Stomach ulcer    Wears dentures    full upper, partial lower   Past Surgical History:  Procedure Laterality Date   BIOPSY  05/20/2020   Procedure: BIOPSY;  Surgeon: Lemar Lofty., MD;  Location: [facility];  Service: Gastroenterology;;   BIOPSY  02/25/2021   Procedure: BIOPSY;  Surgeon: Lemar Lofty., MD;  Location: [facility] ENDOSCOPY;  Service: Gastroenterology;;   BIOPSY  06/24/2021   Procedure: BIOPSY;  Surgeon: Lemar Lofty., MD;  Location: [facility] ENDOSCOPY;  Service: Gastroenterology;;   CATARACT EXTRACTION W/PHACO Right 02/03/2022   Procedure: CATARACT EXTRACTION PHACO AND INTRAOCULAR LENS PLACEMENT (IOC) RIGHT DIABETIC  9.42  01:08.4;  Surgeon: Estanislado Pandy, MD;  Location: [facility];  Service: Ophthalmology;  Laterality: Right;  Diabetic   CATARACT EXTRACTION W/PHACO Left 03/03/2022   Procedure: CATARACT EXTRACTION PHACO AND INTRAOCULAR LENS PLACEMENT (IOC) LEFT DIABETIC  12.57  01:09.8;  Surgeon: Estanislado Pandy, MD;  Location: [facility];  Service: Ophthalmology;  Laterality: Left;   CYST GASTROSTOMY  06/24/2021    Procedure: NECROSECTOMY;  Surgeon: Meridee Score Netty Starring., MD;  Location: [facility] ENDOSCOPY;  Service: Gastroenterology;;   ESOPHAGOGASTRODUODENOSCOPY N/A 05/20/2020   Procedure: ESOPHAGOGASTRODUODENOSCOPY (EGD);  Surgeon: Lemar Lofty., MD;  Location: [facility];  Service: Gastroenterology;  Laterality: N/A;   ESOPHAGOGASTRODUODENOSCOPY N/A 01/18/2022   Procedure: ESOPHAGOGASTRODUODENOSCOPY (EGD);  Surgeon: Lemar Lofty., MD;  Location: [facility];  Service: Gastroenterology;  Laterality: N/A;   ESOPHAGOGASTRODUODENOSCOPY (EGD) WITH PROPOFOL N/A 02/25/2021   Procedure: ESOPHAGOGASTRODUODENOSCOPY (EGD) WITH PROPOFOL;  Surgeon: Meridee Score Netty Starring., MD;  Location: [facility] ENDOSCOPY;  Service: Gastroenterology;  Laterality: N/A;   ESOPHAGOGASTRODUODENOSCOPY (EGD) WITH PROPOFOL N/A 06/24/2021   Procedure: ESOPHAGOGASTRODUODENOSCOPY (EGD) WITH PROPOFOL;  Surgeon: Meridee Score Netty Starring., MD;  Location: [facility] ENDOSCOPY;  Service: Gastroenterology;  Laterality: N/A;   EUS N/A 01/18/2022   Procedure: UPPER ENDOSCOPIC ULTRASOUND (EUS) LINEAR;  Surgeon: Lemar Lofty., MD;  Location: [facility] ENDOSCOPY;  Service: Gastroenterology;  Laterality: N/A;   FINE NEEDLE ASPIRATION N/A 01/18/2022   Procedure: FINE NEEDLE ASPIRATION (FNA) LINEAR;  Surgeon: Lemar Lofty., MD;  Location: [facility] ENDOSCOPY;  Service: Gastroenterology;  Laterality: N/A;   FOREIGN BODY REMOVAL  06/24/2021   Procedure: FOREIGN BODY REMOVAL;  Surgeon: Meridee Score Netty Starring., MD;  Location: [facility] ENDOSCOPY;  Service: Gastroenterology;;   LAPAROSCOPIC OVARIAN CYSTECTOMY     STENT REMOVAL  06/24/2021   Procedure: STENT REMOVAL;  Surgeon: Lemar Lofty., MD;  Location: [facility] ENDOSCOPY;  Service: Gastroenterology;;   TONSILLECTOMY     UPPER ESOPHAGEAL ENDOSCOPIC ULTRASOUND (EUS) N/A 05/20/2020   Procedure: UPPER ESOPHAGEAL ENDOSCOPIC ULTRASOUND (EUS)  ;  Surgeon: Lemar Lofty., MD;  Location: [facility];  Service:  Gastroenterology;  Laterality: N/A;   UPPER ESOPHAGEAL ENDOSCOPIC ULTRASOUND (EUS) N/A 02/25/2021   Procedure: UPPER ESOPHAGEAL ENDOSCOPIC ULTRASOUND (EUS);  Surgeon: Lemar Lofty., MD;  Location: [facility] ENDOSCOPY;  Service: Gastroenterology;  Laterality: N/A;   Social History   Socioeconomic History   Marital status: Widowed    Spouse name: Not on file   Number of children: 4   Years of education: Not on file   Highest education level: Some college, no degree  Occupational History   Occupation: retired  Tobacco Use   Smoking status: Every Day    Packs/day: 0.25    Years: 60.00    Additional pack years: 0.00    Total pack years: 15.00    Types: Cigarettes   Smokeless tobacco: Never   Tobacco comments:    0.25 a pack a day (started age 76)  Vaping Use   Vaping Use: Never used  Substance and Sexual Activity   Alcohol use: Not Currently   Drug use: No   Sexual activity: Not on file  Other Topics Concern   Not on file  Social History Narrative   Not on file   Social Determinants of Health   Financial Resource Strain: Low Risk  (04/25/2022)   Overall Financial Resource Strain (CARDIA)    Difficulty of Paying Living Expenses: Not hard at all  Food Insecurity: No Food Insecurity (04/25/2022)   Hunger Vital Sign    Worried About Running Out of Food in the Last Year: Never true    Ran Out of Food in the Last Year: Never true  Transportation Needs: No Transportation Needs (04/25/2022)   PRAPARE - Administrator, Civil Service (Medical): No    Lack of Transportation (Non-Medical): No  Physical Activity: Insufficiently Active (04/25/2022)   Exercise Vital Sign    Days of Exercise per Week: 7 days    Minutes of Exercise per Session: 10 min  Stress: No Stress Concern Present (04/25/2022)   [employer] of Occupational Health - Occupational Stress Questionnaire    Feeling of Stress : Only a little  Social Connections: Moderately Integrated (04/25/2022)   Social  Connection and Isolation Panel [NHANES]    Frequency of Communication with Friends and Family: More than three times a week    Frequency of Social Gatherings with Friends and Family: Once a week    Attends Religious Services: More than 4 times per year    Active Member of [employer] or Organizations: Yes    Attends Banker Meetings: More than 4 times per year    Marital Status: Widowed  Intimate Partner Violence: Not At Risk (04/15/2022)   Humiliation, Afraid, Rape, and Kick questionnaire    Fear of Current or Ex-Partner: No    Emotionally Abused: No    Physically Abused: No    Sexually Abused: No   Family History  Problem Relation Age of Onset   Heart disease Mother    Kidney disease Mother    Diabetes Father    Pancreatic cancer Brother  Agent Orange   Gallstones Daughter    Colon cancer Son 73   Diabetes Paternal Uncle        x 2   Esophageal cancer Neg Hx    Inflammatory bowel disease Neg Hx    Liver disease Neg Hx    Stomach cancer Neg Hx    I have reviewed her medical, social, and family history in detail and updated the electronic medical record as necessary.    PHYSICAL EXAMINATION  BP 130/80   Pulse 68   Ht 5' 5.5" (1.664 m)   Wt 126 lb (57.2 kg)   BMI 20.65 kg/m  GEN: NAD, appears stated age, doesn't appear chronically ill PSYCH: Cooperative, without pressured speech EYE: Conjunctivae pink, sclerae anicteric ENT: MMM CV: Nontachycardic RESP: No audible wheezing GI: NABS, soft, NT/ND, without rebound MSK/EXT: No lower extremity edema SKIN: No jaundice NEURO:  Alert & Oriented x 3, no focal deficits   REVIEW OF DATA  I reviewed the following data at the time of this encounter:  GI Procedures and Studies  No new studies to review  Laboratory Studies  Reviewed those in epic and care everywhere  Imaging Studies  No new imaging to review   ASSESSMENT  Ms. Schwantz is a 77 y.o. female with a pmh significant for hypertension, hyperlipidemia  anxiety, arthritis peptic ulcer disease, seizures, AAA, insulin-dependent diabetes (secondary to severe pancreatitis), family history colon cancer (son), family history of pancreas cancer (brother), idiopathic pancreatitis in 2022 (complicated pancreatic pseudocyst with walled off necrosis, splenic vein thrombosis-now decompressed and prior necrosectomy but now with recurrent pseudocyst now status post repeat drainage/aspiration).  The patient is seen today for evaluation and management of:  1. History of necrotizing pancreatitis   2. Pancreatic cyst    The patient remains clinically and hemodynamically stable.  Will plan a 3-month follow-up imaging study when she returns from the [place] in this fall.  Hopefully we will not see any progressive changes.  Tell.  Hopefully we can move things out even further from then.  She is going to work with her primary care provider and with endocrinology to optimize her diabetes control, as this is what well be most significant for her moving forward.  Certainly we have been monitoring her pancreas closely and there has not been any evidence of any mass or lesion that we missed but we need to be mindful which is why we also want to continue to do pancreas.  If there is been a significant recurrence of the pseudocyst, then we will need to consider pancreatic ERCP and potential repeat attempt at cyst aspiration versus cystgastrostomy all patient questions were answered to the best of my ability, and the patient agrees to the aforementioned plan of action with follow-up as indicated.   PLAN  Pancreatic CT abdomen protocol August/September when she returns from the [place] for surveillance Consider pancreatic ERCP attempt in future (patient declines at this time) Continue Creon 72,000 units with each meal and 36,000 units with each snack Continue to decrease all tobacco use Cologuard repeat testing in 2026 - This was not the ideal test for her (due to family history)  but she did not want to undergo colonoscopy Continue to work with PCP and endocrinology in regards to diabetes control   Orders Placed This Encounter  Procedures   Basic Metabolic Panel (BMET)    New Prescriptions   No medications on file   Modified Medications   No medications on file    Planned  Follow Up No follow-ups on file.   Total Time in Face-to-Face and in Coordination of Care for patient including independent/personal interpretation/review of prior testing, medical history, examination, medication adjustment, review of imaging, communicating results with the patient directly, and documentation with the EHR is 25 minutes.   Corliss Parish, MD [facility] Office # [phone]

## 2022-05-08 ENCOUNTER — Encounter: Payer: Self-pay | Admitting: Gastroenterology

## 2022-05-09 DIAGNOSIS — E782 Mixed hyperlipidemia: Secondary | ICD-10-CM | POA: Diagnosis not present

## 2022-05-09 DIAGNOSIS — E1365 Other specified diabetes mellitus with hyperglycemia: Secondary | ICD-10-CM | POA: Diagnosis not present

## 2022-05-09 DIAGNOSIS — K8681 Exocrine pancreatic insufficiency: Secondary | ICD-10-CM | POA: Diagnosis not present

## 2022-05-09 DIAGNOSIS — F17200 Nicotine dependence, unspecified, uncomplicated: Secondary | ICD-10-CM | POA: Diagnosis not present

## 2022-05-09 DIAGNOSIS — K861 Other chronic pancreatitis: Secondary | ICD-10-CM | POA: Diagnosis not present

## 2022-05-09 DIAGNOSIS — I10 Essential (primary) hypertension: Secondary | ICD-10-CM | POA: Diagnosis not present

## 2022-05-11 ENCOUNTER — Encounter: Payer: Self-pay | Admitting: Nurse Practitioner

## 2022-05-11 ENCOUNTER — Encounter: Payer: Self-pay | Admitting: Gastroenterology

## 2022-06-14 ENCOUNTER — Encounter: Payer: Self-pay | Admitting: Nurse Practitioner

## 2022-06-14 ENCOUNTER — Ambulatory Visit (INDEPENDENT_AMBULATORY_CARE_PROVIDER_SITE_OTHER): Payer: Medicare Other | Admitting: Nurse Practitioner

## 2022-06-14 VITALS — BP 140/82 | HR 60 | Temp 98.5°F | Ht 65.5 in | Wt 127.2 lb

## 2022-06-14 DIAGNOSIS — I10 Essential (primary) hypertension: Secondary | ICD-10-CM

## 2022-06-14 DIAGNOSIS — I129 Hypertensive chronic kidney disease with stage 1 through stage 4 chronic kidney disease, or unspecified chronic kidney disease: Secondary | ICD-10-CM | POA: Diagnosis not present

## 2022-06-14 DIAGNOSIS — E1165 Type 2 diabetes mellitus with hyperglycemia: Secondary | ICD-10-CM | POA: Diagnosis not present

## 2022-06-14 DIAGNOSIS — N1832 Chronic kidney disease, stage 3b: Secondary | ICD-10-CM | POA: Diagnosis not present

## 2022-06-14 DIAGNOSIS — Z794 Long term (current) use of insulin: Secondary | ICD-10-CM

## 2022-06-14 DIAGNOSIS — E782 Mixed hyperlipidemia: Secondary | ICD-10-CM | POA: Diagnosis not present

## 2022-06-14 MED ORDER — LOSARTAN POTASSIUM 50 MG PO TABS
50.0000 mg | ORAL_TABLET | Freq: Every day | ORAL | 3 refills | Status: DC
Start: 2022-06-14 — End: 2022-09-28

## 2022-06-14 NOTE — Assessment & Plan Note (Signed)
Elevated reading x 2 today. Home numbers reviewed, systolic consistently above 140. Will discontinue Lisinopril and start patient on Losartan 50 mg daily. She will continue to check her blood pressure daily at home. She will return in 2 weeks for a blood pressure check and lab work. Will continue to monitor.

## 2022-06-14 NOTE — Assessment & Plan Note (Addendum)
Chronic. Uncontrolled. She has been taking her Crestor 10 mg, a full tablet, daily. Continue. Will plan to recheck lipids at next appointment. Encouraged healthy diet.

## 2022-06-14 NOTE — Assessment & Plan Note (Signed)
Recently evaluated by Endocrinology. Tresiba decreased to 14 units daily and she was started on Humalog 4 units BID. Her blood sugar continues to be uncontrolled with numbers ranging from 100-400 throughout the day. She will continue her current medication regiment and follow up with Endocrinology at the end of the month as scheduled. Encouraged healthy diet.

## 2022-06-14 NOTE — Progress Notes (Signed)
[name], NP-C Phone: [phone]  Holly Navarro is a 77 y.o. female who presents today for follow up. She has no complaints or new concerns today. She was recently evaluated by Endocrinology who adjusted her Evaristo Bury and started her on Humalog twice daily. They also wanted to change her Lisinopril to Losartan. She has not started on this, she reports it was never sent to her pharmacy. She has a follow up scheduled for the end of this month. She reports doing well on her new insulin regimen. She is getting ready to go out of town until September.   HYPERTENSION Disease Monitoring: Blood pressure range- 140-150/80 Chest pain- No      Dyspnea- No Medications: Compliance- Atenolol and Lisinopril Lightheadedness- No   Edema- No  Lab Results  Component Value Date   NA 141 04/25/2022   K 3.7 04/25/2022   CO2 29 04/25/2022   GLUCOSE 100 (H) 04/25/2022   BUN 16 04/25/2022   CREATININE 1.24 (H) 04/25/2022   CALCIUM 10.2 04/25/2022   EGFR 46 (L) 10/26/2021   GFRNONAA 48 (L) 10/19/2021    DIABETES Disease Monitoring: Blood Sugar ranges- Uncontrolled, all over from 100-400 Polyuria/phagia/dipsia- No      Optho- Yes Medications: Compliance- Evaristo Bury and Humalog Hypoglycemic symptoms- Rarely, first thing in the AM  Lab Results  Component Value Date   HGBA1C 12.2 (H) 04/25/2022     HYPERLIPIDEMIA Disease Monitoring: See symptoms for Hypertension Medications: Compliance- Crestor Right upper quadrant pain- No  Muscle aches- No  Lab Results  Component Value Date   CHOL 216 (H) 04/25/2022   HDL 45.00 04/25/2022   LDLCALC 151 (H) 10/26/2021   LDLDIRECT 124.0 04/25/2022   TRIG 204.0 (H) 04/25/2022   CHOLHDL 5 04/25/2022      Social History   Tobacco Use  Smoking Status Every Day   Packs/day: 0.25   Years: 60.00   Additional pack years: 0.00   Total pack years: 15.00   Types: Cigarettes  Smokeless Tobacco Never  Tobacco Comments   0.25 a pack a day (started age 31)     Current Outpatient Medications on File Prior to Visit  Medication Sig Dispense Refill   atenolol (TENORMIN) 50 MG tablet Take 1 tablet (50 mg total) by mouth daily. 90 tablet 3   blood glucose meter kit and supplies KIT Dispense based on patient and insurance preference. Use up to four times daily as directed. 1 each 0   cimetidine (TAGAMET) 200 MG tablet Take 200 mg by mouth as needed (indigestion/heartburn.).     Coenzyme Q10 (COQ10) 100 MG CAPS Take 100 mg by mouth in the morning.     diphenhydrAMINE (BENADRYL) 25 mg capsule Take 25 mg by mouth every 6 (six) hours as needed for allergies.     ECHINACEA-GOLDENSEAL PO Take 1 capsule by mouth in the morning and at bedtime.     fluticasone (FLONASE) 50 MCG/ACT nasal spray Place 2 sprays into both nostrils daily as needed. 16 g 1   insulin degludec (TRESIBA FLEXTOUCH) 100 UNIT/ML FlexTouch Pen Inject 20 Units into the skin at bedtime. 15 mL 3   insulin lispro (HUMALOG) 100 UNIT/ML KwikPen SMARTSIG:5-10 Unit(s) SUB-Q 3 Times Daily     Insulin Pen Needle (NOVOFINE PEN NEEDLE) 32G X 6 MM MISC Use to inject insulin daily as directed. 90 each 1   lipase/protease/amylase (CREON) 36000 UNITS CPEP capsule Take 2 capsules (72,000 Units total) by mouth 3 (three) times daily with meals. May also take 1 capsule (  36,000 Units total) as needed (with snacks). 720 capsule 2   MILK THISTLE PO Take 1 tablet by mouth in the morning and at bedtime.     Multiple Vitamins-Minerals (MULTIVITAMIN ADULTS 50+) TABS Take 1 tablet by mouth daily in the afternoon.     pantoprazole (PROTONIX) 40 MG tablet Take 1 tablet (40 mg total) by mouth 2 (two) times daily before a meal. 180 tablet 1   rosuvastatin (CRESTOR) 10 MG tablet Take 1 tablet (10 mg total) by mouth daily. 90 tablet 3   simethicone (MYLICON) 80 MG chewable tablet Chew 80 mg by mouth in the morning, at noon, in the evening, and at bedtime.     Simethicone 125 MG CAPS Take 125 mg by mouth in the morning, at noon,  in the evening, and at bedtime.     No current facility-administered medications on file prior to visit.    ROS see history of present illness  Objective  Physical Exam Vitals:   06/14/22 1431 06/14/22 1500  BP: (!) 144/83 (!) 140/82  Pulse: 60   Temp: 98.5 F (36.9 C)   SpO2: 98%     BP Readings from Last 3 Encounters:  06/14/22 (!) 140/82  05/03/22 130/80  04/29/22 128/82   Wt Readings from Last 3 Encounters:  06/14/22 127 lb 3.2 oz (57.7 kg)  05/03/22 126 lb (57.2 kg)  04/29/22 126 lb (57.2 kg)    Physical Exam Constitutional:      General: She is not in acute distress.    Appearance: Normal appearance.  HENT:     Head: Normocephalic.  Cardiovascular:     Rate and Rhythm: Normal rate and regular rhythm.     Heart sounds: Normal heart sounds.  Pulmonary:     Effort: Pulmonary effort is normal.     Breath sounds: Normal breath sounds.  Skin:    General: Skin is warm and dry.  Neurological:     General: No focal deficit present.     Mental Status: She is alert.  Psychiatric:        Mood and Affect: Mood normal.        Behavior: Behavior normal.    Assessment/Plan: Please see individual problem list.  Essential hypertension Assessment & Plan: Elevated reading x 2 today. Home numbers reviewed, systolic consistently above 140. Will discontinue Lisinopril and start patient on Losartan 50 mg daily. She will continue to check her blood pressure daily at home. She will return in 2 weeks for a blood pressure check and lab work. Will continue to monitor.   Orders: -     Basic metabolic panel; Future -     Losartan Potassium; Take 1 tablet (50 mg total) by mouth daily.  Dispense: 90 tablet; Refill: 3  Type 2 diabetes mellitus with hyperglycemia, with long-term current use of insulin (HCC) Assessment & Plan: Recently evaluated by Endocrinology. Tresiba decreased to 14 units daily and she was started on Humalog 4 units BID. Her blood sugar continues to be  uncontrolled with numbers ranging from 100-400 throughout the day. She will continue her current medication regiment and follow up with Endocrinology at the end of the month as scheduled. Encouraged healthy diet.    Mixed dyslipidemia Assessment & Plan: Chronic. Uncontrolled. She has been taking her Crestor 10 mg, a full tablet, daily. Continue. Will plan to recheck lipids at next appointment. Encouraged healthy diet.    Hypertensive kidney disease with stage 3b chronic kidney disease (HCC) Assessment & Plan: Most recent  GFR- 42. Encouraged adequate fluid intake. Counseled on importance of glucose and blood pressure control. Discontinuing Lisinopril and starting patient on Losartan 50 mg daily. Will continue to monitor.   Orders: -     Basic metabolic panel; Future -     Losartan Potassium; Take 1 tablet (50 mg total) by mouth daily.  Dispense: 90 tablet; Refill: 3   Return in about 2 weeks (around 06/28/2022) for Blood pressure check/labs then 3 month follow up.   [name], NP-C [facility] [employer] [facility]

## 2022-06-14 NOTE — Assessment & Plan Note (Signed)
Most recent GFR- 42. Encouraged adequate fluid intake. Counseled on importance of glucose and blood pressure control. Discontinuing Lisinopril and starting patient on Losartan 50 mg daily. Will continue to monitor.

## 2022-06-15 ENCOUNTER — Telehealth: Payer: Self-pay | Admitting: Nurse Practitioner

## 2022-06-15 ENCOUNTER — Encounter: Payer: Self-pay | Admitting: Nurse Practitioner

## 2022-06-15 NOTE — Telephone Encounter (Signed)
Message sent through MyChart.  Holly Navarro  P [facility] (supporting Mychart, Generic)11 hours ago (8:02 PM)   I will be there for the nurse visit. Also, Dr Roanna Raider just advised me to only take 10 units of the treshiba  at night and increase my humalog by 2 units, making it 6 units for the 2 largest meals of the day. I will keep you informed.

## 2022-06-28 ENCOUNTER — Ambulatory Visit (INDEPENDENT_AMBULATORY_CARE_PROVIDER_SITE_OTHER): Payer: Medicare Other

## 2022-06-28 DIAGNOSIS — I129 Hypertensive chronic kidney disease with stage 1 through stage 4 chronic kidney disease, or unspecified chronic kidney disease: Secondary | ICD-10-CM

## 2022-06-28 DIAGNOSIS — N1832 Chronic kidney disease, stage 3b: Secondary | ICD-10-CM

## 2022-06-28 DIAGNOSIS — I10 Essential (primary) hypertension: Secondary | ICD-10-CM

## 2022-06-28 NOTE — Progress Notes (Signed)
Patient presented in office for Bp check. I checked pt Bp in her left arm and it was 164/72   Hr was 86 O2 was 97. I had pt sit and relax for 10 mins and went and rechecked it it was still elevated    in her left arm Bp was 150/78 Hr was 69 O2 was 97. Pt did not show any signs of distress or HA. pT WAS ADVISED TO KEEP A CHECK ON HER bp AND LOG IT EACH TIME SHE CHECKS until next visit

## 2022-06-29 ENCOUNTER — Telehealth: Payer: Self-pay

## 2022-06-29 ENCOUNTER — Other Ambulatory Visit: Payer: Self-pay | Admitting: Internal Medicine

## 2022-06-29 ENCOUNTER — Other Ambulatory Visit: Payer: Self-pay

## 2022-06-29 LAB — BASIC METABOLIC PANEL
BUN: 18 mg/dL (ref 6–23)
CO2: 28 mEq/L (ref 19–32)
Calcium: 9.8 mg/dL (ref 8.4–10.5)
Chloride: 98 mEq/L (ref 96–112)
Creatinine, Ser: 1.36 mg/dL — ABNORMAL HIGH (ref 0.40–1.20)
GFR: 37.64 mL/min — ABNORMAL LOW (ref >60.00)
Glucose, Bld: 271 mg/dL — ABNORMAL HIGH (ref 70–99)
Potassium: 4.6 mEq/L (ref 3.5–5.1)
Sodium: 134 mEq/L — ABNORMAL LOW (ref 135–145)

## 2022-06-29 MED ORDER — PANTOPRAZOLE SODIUM 40 MG PO TBEC: 40.0000 mg | DELAYED_RELEASE_TABLET | Freq: Two times a day (BID) | ORAL | 1 refills | Status: DC

## 2022-06-29 NOTE — Telephone Encounter (Signed)
LMOM for pt to CB in regards to labs 

## 2022-06-29 NOTE — Telephone Encounter (Signed)
E fax sent to [facility] requesting pts labs and progress notes

## 2022-07-01 DIAGNOSIS — K8681 Exocrine pancreatic insufficiency: Secondary | ICD-10-CM | POA: Diagnosis not present

## 2022-07-01 DIAGNOSIS — F17200 Nicotine dependence, unspecified, uncomplicated: Secondary | ICD-10-CM | POA: Diagnosis not present

## 2022-07-01 DIAGNOSIS — K861 Other chronic pancreatitis: Secondary | ICD-10-CM | POA: Diagnosis not present

## 2022-07-01 DIAGNOSIS — E1365 Other specified diabetes mellitus with hyperglycemia: Secondary | ICD-10-CM | POA: Diagnosis not present

## 2022-07-01 DIAGNOSIS — I10 Essential (primary) hypertension: Secondary | ICD-10-CM | POA: Diagnosis not present

## 2022-07-01 DIAGNOSIS — E782 Mixed hyperlipidemia: Secondary | ICD-10-CM | POA: Diagnosis not present

## 2022-08-18 ENCOUNTER — Encounter (INDEPENDENT_AMBULATORY_CARE_PROVIDER_SITE_OTHER): Payer: Self-pay

## 2022-08-26 ENCOUNTER — Telehealth: Payer: Self-pay

## 2022-08-26 DIAGNOSIS — Z8719 Personal history of other diseases of the digestive system: Secondary | ICD-10-CM

## 2022-08-26 DIAGNOSIS — K862 Cyst of pancreas: Secondary | ICD-10-CM

## 2022-08-26 NOTE — Telephone Encounter (Signed)
-----   Message from Kahi Mohala Evoleth Nordmeyer B sent at 05/05/2022  1:05 PM EDT ----- Regarding: CT Abdomen -Pancreas Protocol Patient needs CT abdomen -Pancreas Protocol in September 2024. Patient would like to have her CT scan done at [facility].   DX: History of Pancreatitis, Pancreatic Cyst  Schedule patient follow-up appointment to see Dr.Mansouraty after CT scan has been scheduled.

## 2022-08-26 NOTE — Telephone Encounter (Signed)
You have been scheduled for a CT scan of the abdomen at [facility] . Enter through [facility].You are scheduled on 09/15/22 at 2:30pm . You should arrive 15 minutes prior to your appointment time for registration.   Please follow the written instructions below on the day of your exam:   1) Do not eat anything after 10:30 am  (4 hours prior to your test) You may have liquids ONLY.   You may take any medications as prescribed with a small amount of water, if necessary. If you take any of the following medications: METFORMIN, GLUCOPHAGE, GLUCOVANCE, AVANDAMET, RIOMET, FORTAMET, ACTOPLUS MET, JANUMET, GLUMETZA or METAGLIP, you MAY be asked to HOLD this medication 48 hours AFTER the exam.   The purpose of you drinking the oral contrast is to aid in the visualization of your intestinal tract. The contrast solution may cause some diarrhea. Depending on your individual set of symptoms, you may also receive an intravenous injection of x-ray contrast/dye. Plan on being at [facility] for 45 minutes or longer, depending on the type of exam you are having performed.   If you have any questions regarding your exam or if you need to reschedule, you may call [facility] at [phone] between the hours of 8:00 am and 5:00 pm, Monday-Friday.   Follow- up appointment with Dr.Mansouraty scheduled for 11/23/22 at 2:10 pm.   All information has been sent to patient via- my chart.

## 2022-08-30 ENCOUNTER — Telehealth: Payer: Self-pay | Admitting: Nurse Practitioner

## 2022-08-30 DIAGNOSIS — Z794 Long term (current) use of insulin: Secondary | ICD-10-CM

## 2022-08-30 MED ORDER — TRESIBA FLEXTOUCH 100 UNIT/ML ~~LOC~~ SOPN
20.0000 [IU] | PEN_INJECTOR | Freq: Every day | SUBCUTANEOUS | 0 refills | Status: DC
Start: 2022-08-30 — End: 2023-03-10

## 2022-08-30 NOTE — Telephone Encounter (Signed)
Refill has been sent.  °

## 2022-08-30 NOTE — Telephone Encounter (Signed)
Patient just called and needs her prescription refilled. The name is insulin degludec (TRESIBA FLEXTOUCH) 100 UNIT/ML FlexTouch Pen. The pharmacy is [facility] pharmacy [address],[place] [address]. They number is [phone]. She states she is out of her prescription.

## 2022-09-03 DIAGNOSIS — G8194 Hemiplegia, unspecified affecting left nondominant side: Secondary | ICD-10-CM | POA: Diagnosis not present

## 2022-09-03 DIAGNOSIS — K8681 Exocrine pancreatic insufficiency: Secondary | ICD-10-CM | POA: Diagnosis not present

## 2022-09-03 DIAGNOSIS — R29818 Other symptoms and signs involving the nervous system: Secondary | ICD-10-CM | POA: Diagnosis not present

## 2022-09-03 DIAGNOSIS — I639 Cerebral infarction, unspecified: Secondary | ICD-10-CM

## 2022-09-03 DIAGNOSIS — N179 Acute kidney failure, unspecified: Secondary | ICD-10-CM | POA: Diagnosis not present

## 2022-09-03 DIAGNOSIS — R260 Ataxic gait: Secondary | ICD-10-CM | POA: Diagnosis not present

## 2022-09-03 DIAGNOSIS — I10 Essential (primary) hypertension: Secondary | ICD-10-CM | POA: Diagnosis not present

## 2022-09-03 DIAGNOSIS — R4781 Slurred speech: Secondary | ICD-10-CM | POA: Diagnosis not present

## 2022-09-03 DIAGNOSIS — K279 Peptic ulcer, site unspecified, unspecified as acute or chronic, without hemorrhage or perforation: Secondary | ICD-10-CM | POA: Diagnosis not present

## 2022-09-03 DIAGNOSIS — E119 Type 2 diabetes mellitus without complications: Secondary | ICD-10-CM | POA: Diagnosis not present

## 2022-09-03 DIAGNOSIS — K219 Gastro-esophageal reflux disease without esophagitis: Secondary | ICD-10-CM | POA: Diagnosis not present

## 2022-09-03 DIAGNOSIS — R29810 Facial weakness: Secondary | ICD-10-CM | POA: Diagnosis not present

## 2022-09-03 DIAGNOSIS — R531 Weakness: Secondary | ICD-10-CM | POA: Diagnosis not present

## 2022-09-03 DIAGNOSIS — I69354 Hemiplegia and hemiparesis following cerebral infarction affecting left non-dominant side: Secondary | ICD-10-CM | POA: Diagnosis not present

## 2022-09-03 DIAGNOSIS — G459 Transient cerebral ischemic attack, unspecified: Secondary | ICD-10-CM | POA: Diagnosis not present

## 2022-09-03 DIAGNOSIS — I63411 Cerebral infarction due to embolism of right middle cerebral artery: Secondary | ICD-10-CM | POA: Diagnosis not present

## 2022-09-03 DIAGNOSIS — F17210 Nicotine dependence, cigarettes, uncomplicated: Secondary | ICD-10-CM | POA: Diagnosis not present

## 2022-09-03 DIAGNOSIS — I161 Hypertensive emergency: Secondary | ICD-10-CM | POA: Diagnosis not present

## 2022-09-03 DIAGNOSIS — R2689 Other abnormalities of gait and mobility: Secondary | ICD-10-CM | POA: Diagnosis not present

## 2022-09-03 DIAGNOSIS — Z79899 Other long term (current) drug therapy: Secondary | ICD-10-CM | POA: Diagnosis not present

## 2022-09-03 DIAGNOSIS — I69328 Other speech and language deficits following cerebral infarction: Secondary | ICD-10-CM | POA: Diagnosis not present

## 2022-09-03 DIAGNOSIS — Z794 Long term (current) use of insulin: Secondary | ICD-10-CM | POA: Diagnosis not present

## 2022-09-03 DIAGNOSIS — E785 Hyperlipidemia, unspecified: Secondary | ICD-10-CM | POA: Diagnosis not present

## 2022-09-03 DIAGNOSIS — R7309 Other abnormal glucose: Secondary | ICD-10-CM | POA: Diagnosis not present

## 2022-09-03 DIAGNOSIS — I160 Hypertensive urgency: Secondary | ICD-10-CM | POA: Diagnosis not present

## 2022-09-03 DIAGNOSIS — I2510 Atherosclerotic heart disease of native coronary artery without angina pectoris: Secondary | ICD-10-CM | POA: Diagnosis not present

## 2022-09-03 DIAGNOSIS — I6523 Occlusion and stenosis of bilateral carotid arteries: Secondary | ICD-10-CM | POA: Diagnosis not present

## 2022-09-03 DIAGNOSIS — J302 Other seasonal allergic rhinitis: Secondary | ICD-10-CM | POA: Diagnosis not present

## 2022-09-03 DIAGNOSIS — I63231 Cerebral infarction due to unspecified occlusion or stenosis of right carotid arteries: Secondary | ICD-10-CM | POA: Diagnosis not present

## 2022-09-03 DIAGNOSIS — Z90410 Acquired total absence of pancreas: Secondary | ICD-10-CM | POA: Diagnosis not present

## 2022-09-03 DIAGNOSIS — R079 Chest pain, unspecified: Secondary | ICD-10-CM | POA: Diagnosis not present

## 2022-09-03 HISTORY — DX: Cerebral infarction, unspecified: I63.9

## 2022-09-04 LAB — HEMOGLOBIN A1C: Hemoglobin A1C: 10.5

## 2022-09-07 DIAGNOSIS — I10 Essential (primary) hypertension: Secondary | ICD-10-CM | POA: Diagnosis not present

## 2022-09-07 DIAGNOSIS — E119 Type 2 diabetes mellitus without complications: Secondary | ICD-10-CM | POA: Diagnosis not present

## 2022-09-07 DIAGNOSIS — E785 Hyperlipidemia, unspecified: Secondary | ICD-10-CM | POA: Diagnosis not present

## 2022-09-07 DIAGNOSIS — R2689 Other abnormalities of gait and mobility: Secondary | ICD-10-CM | POA: Diagnosis not present

## 2022-09-07 DIAGNOSIS — I69328 Other speech and language deficits following cerebral infarction: Secondary | ICD-10-CM | POA: Diagnosis not present

## 2022-09-07 DIAGNOSIS — Z794 Long term (current) use of insulin: Secondary | ICD-10-CM | POA: Diagnosis not present

## 2022-09-07 DIAGNOSIS — I639 Cerebral infarction, unspecified: Secondary | ICD-10-CM | POA: Diagnosis not present

## 2022-09-07 DIAGNOSIS — Z90410 Acquired total absence of pancreas: Secondary | ICD-10-CM | POA: Diagnosis not present

## 2022-09-07 DIAGNOSIS — R260 Ataxic gait: Secondary | ICD-10-CM | POA: Diagnosis not present

## 2022-09-07 DIAGNOSIS — J302 Other seasonal allergic rhinitis: Secondary | ICD-10-CM | POA: Diagnosis not present

## 2022-09-07 DIAGNOSIS — F17210 Nicotine dependence, cigarettes, uncomplicated: Secondary | ICD-10-CM | POA: Diagnosis not present

## 2022-09-07 DIAGNOSIS — K8681 Exocrine pancreatic insufficiency: Secondary | ICD-10-CM | POA: Diagnosis not present

## 2022-09-07 DIAGNOSIS — K219 Gastro-esophageal reflux disease without esophagitis: Secondary | ICD-10-CM | POA: Diagnosis not present

## 2022-09-07 DIAGNOSIS — I69354 Hemiplegia and hemiparesis following cerebral infarction affecting left non-dominant side: Secondary | ICD-10-CM | POA: Diagnosis not present

## 2022-09-07 DIAGNOSIS — R238 Other skin changes: Secondary | ICD-10-CM | POA: Diagnosis not present

## 2022-09-08 ENCOUNTER — Encounter: Payer: Self-pay | Admitting: Gastroenterology

## 2022-09-08 NOTE — Telephone Encounter (Signed)
FYI Dr Meridee Score- she had a stroke and has cancelled her CT and appt for now. She will call back when she is able to reschedule.

## 2022-09-09 ENCOUNTER — Encounter: Payer: Self-pay | Admitting: Nurse Practitioner

## 2022-09-15 ENCOUNTER — Encounter: Payer: Self-pay | Admitting: Nurse Practitioner

## 2022-09-15 ENCOUNTER — Other Ambulatory Visit: Payer: Medicare Other

## 2022-09-22 ENCOUNTER — Ambulatory Visit: Payer: Medicare Other | Admitting: Nurse Practitioner

## 2022-09-26 ENCOUNTER — Encounter: Payer: Self-pay | Admitting: Nurse Practitioner

## 2022-09-26 MED ORDER — PANCRELIPASE (LIP-PROT-AMYL) 36000-114000 UNITS PO CPEP: ORAL_CAPSULE | ORAL | 2 refills | Status: DC

## 2022-09-26 NOTE — Addendum Note (Signed)
Addended by: Loretha Stapler on: 09/26/2022 01:51 PM   Modules accepted: Orders

## 2022-09-27 ENCOUNTER — Ambulatory Visit: Payer: Medicare Other

## 2022-09-28 ENCOUNTER — Encounter: Payer: Self-pay | Admitting: Nurse Practitioner

## 2022-09-28 ENCOUNTER — Ambulatory Visit (INDEPENDENT_AMBULATORY_CARE_PROVIDER_SITE_OTHER): Payer: Medicare Other | Admitting: Nurse Practitioner

## 2022-09-28 VITALS — BP 150/72 | HR 58 | Temp 97.8°F | Ht 65.5 in | Wt 126.2 lb

## 2022-09-28 DIAGNOSIS — Z794 Long term (current) use of insulin: Secondary | ICD-10-CM | POA: Diagnosis not present

## 2022-09-28 DIAGNOSIS — I10 Essential (primary) hypertension: Secondary | ICD-10-CM | POA: Diagnosis not present

## 2022-09-28 DIAGNOSIS — E1165 Type 2 diabetes mellitus with hyperglycemia: Secondary | ICD-10-CM | POA: Diagnosis not present

## 2022-09-28 DIAGNOSIS — I639 Cerebral infarction, unspecified: Secondary | ICD-10-CM

## 2022-09-28 DIAGNOSIS — R29898 Other symptoms and signs involving the musculoskeletal system: Secondary | ICD-10-CM

## 2022-09-28 MED ORDER — AMLODIPINE BESYLATE 5 MG PO TABS
5.0000 mg | ORAL_TABLET | Freq: Every day | ORAL | 3 refills | Status: DC
Start: 2022-09-28 — End: 2023-10-16

## 2022-09-28 MED ORDER — CLOPIDOGREL BISULFATE 75 MG PO TABS: 75.0000 mg | ORAL_TABLET | Freq: Every day | ORAL | 1 refills | Status: DC

## 2022-09-28 NOTE — Progress Notes (Signed)
[name], NP-C Phone: [phone]  Holly Navarro is a 77 y.o. female who presents today for rehab follow up.   Patient suffered CVA on 09/03/2022 while on vacation in [place]. She was admitted to a rehab facility on 09/07/2022 and completed therapy. She was discharged from the facility on 09/21/2022. Hospital and rehab notes reviewed. She returned home on 09/25/2022. She has been doing well since returning home. She continues to have mild left upper extremity weakness. She would like to have PT/OT.  She is also needing a Neurologist to follow up with.   HYPERTENSION Disease Monitoring Home BP Monitoring- 126-131/77-82  Chest pain- No    Dyspnea- No Medications Compliance-  Norvasc, Losartan, Atenolol. Lightheadedness-  No  Edema- No BMET    Component Value Date/Time   NA 134 (L) 06/28/2022 1505   NA 137 03/30/2020 1406   K 4.6 06/28/2022 1505   CL 98 06/28/2022 1505   CO2 28 06/28/2022 1505   GLUCOSE 271 (H) 06/28/2022 1505   BUN 18 06/28/2022 1505   BUN 12 03/30/2020 1406   CREATININE 1.36 (H) 06/28/2022 1505   CREATININE 1.23 (H) 10/26/2021 1414   CALCIUM 9.8 06/28/2022 1505   GFRNONAA 48 (L) 10/19/2021 1321   GFRNONAA 49 (L) 12/11/2019 1142   GFRAA 57 (L) 12/11/2019 1142   DIABETES Disease Monitoring: Blood Sugar ranges- 150-180s Polyuria/phagia/dipsia- No      Optho- Yes Medications: Compliance- Tresiba and Humalog Hypoglycemic symptoms- No Last A1c 09/04/2022- 10.5  Social History   Tobacco Use  Smoking Status Every Day   Current packs/day: 0.25   Average packs/day: 0.3 packs/day for 60.0 years (15.0 ttl pk-yrs)   Types: Cigarettes  Smokeless Tobacco Never  Tobacco Comments   0.25 a pack a day (started age 62)    Current Outpatient Medications on File Prior to Visit  Medication Sig Dispense Refill   aspirin EC 81 MG tablet Take 81 mg by mouth.     atenolol (TENORMIN) 50 MG tablet Take 1 tablet (50 mg total) by mouth daily. 90 tablet 3   blood glucose  meter kit and supplies KIT Dispense based on patient and insurance preference. Use up to four times daily as directed. 1 each 0   cimetidine (TAGAMET) 200 MG tablet Take 200 mg by mouth as needed (indigestion/heartburn.).     Coenzyme Q10 (COQ10) 100 MG CAPS Take 100 mg by mouth in the morning.     diphenhydrAMINE (BENADRYL) 25 mg capsule Take 25 mg by mouth every 6 (six) hours as needed for allergies.     ECHINACEA-GOLDENSEAL PO Take 1 capsule by mouth in the morning and at bedtime.     fluticasone (FLONASE) 50 MCG/ACT nasal spray Place 2 sprays into both nostrils daily as needed. 16 g 1   insulin degludec (TRESIBA FLEXTOUCH) 100 UNIT/ML FlexTouch Pen Inject 20 Units into the skin at bedtime. 15 mL 0   insulin lispro (HUMALOG) 100 UNIT/ML KwikPen SMARTSIG:5-10 Unit(s) SUB-Q 3 Times Daily     Insulin Pen Needle (NOVOFINE PEN NEEDLE) 32G X 6 MM MISC Use to inject insulin daily as directed. 90 each 1   lipase/protease/amylase (CREON) 36000 UNITS CPEP capsule Take 2 capsules (72,000 Units total) by mouth 3 (three) times daily with meals. May also take 1 capsule (36,000 Units total) as needed (with snacks). 720 capsule 2   losartan (COZAAR) 100 MG tablet Take 100 mg by mouth daily.     MILK THISTLE PO Take 1 tablet by mouth in  the morning and at bedtime.     Multiple Vitamins-Minerals (MULTIVITAMIN ADULTS 50+) TABS Take 1 tablet by mouth daily in the afternoon.     pantoprazole (PROTONIX) 40 MG tablet Take 1 tablet (40 mg total) by mouth 2 (two) times daily before a meal. 180 tablet 1   rosuvastatin (CRESTOR) 10 MG tablet Take 1 tablet (10 mg total) by mouth daily. 90 tablet 3   simethicone (MYLICON) 80 MG chewable tablet Chew 80 mg by mouth in the morning, at noon, in the evening, and at bedtime.     Simethicone 125 MG CAPS Take 125 mg by mouth in the morning, at noon, in the evening, and at bedtime.     No current facility-administered medications on file prior to visit.    ROS see history of  present illness  Objective  Physical Exam Vitals:   09/28/22 0826 09/28/22 0852  BP: (!) 144/70 (!) 150/72  Pulse: (!) 58   Temp: 97.8 F (36.6 C)   SpO2: 99%     BP Readings from Last 3 Encounters:  09/28/22 (!) 150/72  06/14/22 (!) 140/82  05/03/22 130/80   Wt Readings from Last 3 Encounters:  09/28/22 126 lb 3.2 oz (57.2 kg)  06/14/22 127 lb 3.2 oz (57.7 kg)  05/03/22 126 lb (57.2 kg)    Physical Exam Constitutional:      General: She is not in acute distress.    Appearance: Normal appearance.  HENT:     Head: Normocephalic.  Cardiovascular:     Rate and Rhythm: Normal rate and regular rhythm.     Heart sounds: Normal heart sounds.  Pulmonary:     Effort: Pulmonary effort is normal.     Breath sounds: Normal breath sounds.  Skin:    General: Skin is warm and dry.  Neurological:     General: No focal deficit present.     Mental Status: She is alert.     Motor: Weakness (left hand) present.  Psychiatric:        Mood and Affect: Mood normal.        Behavior: Behavior normal.    Assessment/Plan: Please see individual problem list.  Cerebrovascular accident (CVA), unspecified mechanism (HCC) Assessment & Plan: Doing well. She will continue her current medication regimen. Refills provided on medications. Referral placed to Neurology for patient to establish care. Referral placed for PT/OT.   Orders: -     Ambulatory referral to Neurology -     Ambulatory referral to Home Health -     Clopidogrel Bisulfate; Take 1 tablet (75 mg total) by mouth daily.  Dispense: 30 tablet; Refill: 1  Weakness of left upper extremity Assessment & Plan: From stroke. Will refer to PT/OT.   Orders: -     Ambulatory referral to Home Health  Essential hypertension Assessment & Plan: Chronic. Elevated today in office. Home readings stable. She will continue Norvasc 5 mg daily, Losartan 100 mg daily and Atenolol 50 mg daily. Refills sent. She will continue to check her blood  pressure daily at home and keep a log. She will contact if staying consistently elevated over 130/90. Return precautions given to patient. Will continue to monitor.   Orders: -     amLODIPine Besylate; Take 1 tablet (5 mg total) by mouth daily.  Dispense: 90 tablet; Refill: 3  Type 2 diabetes mellitus with hyperglycemia, with long-term current use of insulin (HCC) Assessment & Plan: Chronic. Currently on Tresiba and Humalog. Continue. A1c on 09/04/2022- 10.5.  Follow up with Endocrinology as scheduled.     Return in about 3 months (around 12/28/2022) for Follow up.   [name], NP-C [facility] [employer]

## 2022-09-29 NOTE — Assessment & Plan Note (Addendum)
Doing well. She will continue her current medication regimen. Refills provided on medications. Referral placed to Neurology for patient to establish care. Referral placed for PT/OT.

## 2022-10-03 ENCOUNTER — Encounter: Payer: Self-pay | Admitting: Nurse Practitioner

## 2022-10-03 DIAGNOSIS — Z794 Long term (current) use of insulin: Secondary | ICD-10-CM | POA: Diagnosis not present

## 2022-10-03 DIAGNOSIS — Z7902 Long term (current) use of antithrombotics/antiplatelets: Secondary | ICD-10-CM | POA: Diagnosis not present

## 2022-10-03 DIAGNOSIS — I10 Essential (primary) hypertension: Secondary | ICD-10-CM | POA: Diagnosis not present

## 2022-10-03 DIAGNOSIS — F17210 Nicotine dependence, cigarettes, uncomplicated: Secondary | ICD-10-CM | POA: Diagnosis not present

## 2022-10-03 DIAGNOSIS — Z7982 Long term (current) use of aspirin: Secondary | ICD-10-CM | POA: Diagnosis not present

## 2022-10-03 DIAGNOSIS — Z5982 Transportation insecurity: Secondary | ICD-10-CM | POA: Diagnosis not present

## 2022-10-03 DIAGNOSIS — I2510 Atherosclerotic heart disease of native coronary artery without angina pectoris: Secondary | ICD-10-CM | POA: Diagnosis not present

## 2022-10-03 DIAGNOSIS — Z556 Problems related to health literacy: Secondary | ICD-10-CM | POA: Diagnosis not present

## 2022-10-03 DIAGNOSIS — E1165 Type 2 diabetes mellitus with hyperglycemia: Secondary | ICD-10-CM | POA: Diagnosis not present

## 2022-10-03 DIAGNOSIS — I69334 Monoplegia of upper limb following cerebral infarction affecting left non-dominant side: Secondary | ICD-10-CM | POA: Diagnosis not present

## 2022-10-03 DIAGNOSIS — Z9181 History of falling: Secondary | ICD-10-CM | POA: Diagnosis not present

## 2022-10-03 DIAGNOSIS — K5900 Constipation, unspecified: Secondary | ICD-10-CM | POA: Diagnosis not present

## 2022-10-03 NOTE — Assessment & Plan Note (Signed)
From stroke. Will refer to PT/OT.

## 2022-10-03 NOTE — Assessment & Plan Note (Signed)
Chronic. Currently on Tresiba and Humalog. Continue. A1c on 09/04/2022- 10.5. Follow up with Endocrinology as scheduled.

## 2022-10-03 NOTE — Assessment & Plan Note (Signed)
Chronic. Elevated today in office. Home readings stable. She will continue Norvasc 5 mg daily, Losartan 100 mg daily and Atenolol 50 mg daily. Refills sent. She will continue to check her blood pressure daily at home and keep a log. She will contact if staying consistently elevated over 130/90. Return precautions given to patient. Will continue to monitor.

## 2022-10-04 ENCOUNTER — Encounter: Payer: Self-pay | Admitting: Neurology

## 2022-10-04 ENCOUNTER — Other Ambulatory Visit: Payer: Self-pay | Admitting: Nurse Practitioner

## 2022-10-04 DIAGNOSIS — Z556 Problems related to health literacy: Secondary | ICD-10-CM | POA: Diagnosis not present

## 2022-10-04 DIAGNOSIS — I69334 Monoplegia of upper limb following cerebral infarction affecting left non-dominant side: Secondary | ICD-10-CM | POA: Diagnosis not present

## 2022-10-04 DIAGNOSIS — F17210 Nicotine dependence, cigarettes, uncomplicated: Secondary | ICD-10-CM | POA: Diagnosis not present

## 2022-10-04 DIAGNOSIS — Z5982 Transportation insecurity: Secondary | ICD-10-CM | POA: Diagnosis not present

## 2022-10-04 DIAGNOSIS — I10 Essential (primary) hypertension: Secondary | ICD-10-CM | POA: Diagnosis not present

## 2022-10-04 DIAGNOSIS — I639 Cerebral infarction, unspecified: Secondary | ICD-10-CM

## 2022-10-04 DIAGNOSIS — Z794 Long term (current) use of insulin: Secondary | ICD-10-CM | POA: Diagnosis not present

## 2022-10-04 DIAGNOSIS — Z9181 History of falling: Secondary | ICD-10-CM | POA: Diagnosis not present

## 2022-10-04 DIAGNOSIS — I2510 Atherosclerotic heart disease of native coronary artery without angina pectoris: Secondary | ICD-10-CM | POA: Diagnosis not present

## 2022-10-04 DIAGNOSIS — E1165 Type 2 diabetes mellitus with hyperglycemia: Secondary | ICD-10-CM | POA: Diagnosis not present

## 2022-10-04 DIAGNOSIS — Z7902 Long term (current) use of antithrombotics/antiplatelets: Secondary | ICD-10-CM | POA: Diagnosis not present

## 2022-10-04 DIAGNOSIS — Z7982 Long term (current) use of aspirin: Secondary | ICD-10-CM | POA: Diagnosis not present

## 2022-10-04 DIAGNOSIS — K5900 Constipation, unspecified: Secondary | ICD-10-CM | POA: Diagnosis not present

## 2022-10-05 NOTE — Progress Notes (Signed)
Initial neurology clinic note  Holly Navarro MRN: [id number] DOB: 1945/12/13  Referring provider: [name], NP  Primary care provider: [name], NP  Reason for consult:  Right MCA stroke  Subjective:  This is [name], a 77 y.o. right-handed female with a medical history of HTN, HLD, stroke (09/03/22), exocrine pancreatic insufficiency c/b DM, OA, anxiety, current smoker who presents to neurology clinic with right MCA stroke. The patient is alone today.  Patient had acute onset slurred speech and left arm weakness while on vacation in [place] on 09/03/22. She woke up and thought her left arm was asleep and weak and slurred speech was noticed. She was taken to hospital and found to have a right MCA embolic stroke. She had no acute intervention. Patient was started on DAPT for 21 days with plans to continue asa monotherapy thereafter. She was in rehab facility from 09/07/22 to 09/21/22 in [place] and returned home on 09/25/22. She is doing PT/OT currently. She did speech therapy previously, but not now.  Patient continues to take both aspirin and plavix. She is on Crestor 10 mg for HLD. She has exocrine pancreatic insufficiency that has caused DM and made control of DM difficult.  She is a current smoker (about 2 cigarettes) per day. She does not drink EtOH or use any illegal drugs.  Currently, patient still has weakness in her hands and some numbness in her hands. She denies any continuing problems with speech.  MEDICATIONS:  Outpatient Encounter Medications as of 10/13/2022  Medication Sig Note   amLODipine (NORVASC) 5 MG tablet Take 1 tablet (5 mg total) by mouth daily.    aspirin EC 81 MG tablet Take 81 mg by mouth.    atenolol (TENORMIN) 50 MG tablet Take 1 tablet (50 mg total) by mouth daily.    blood glucose meter kit and supplies KIT Dispense based on patient and insurance preference. Use up to four times daily as directed.    cimetidine (TAGAMET) 200 MG tablet Take 200 mg by  mouth as needed (indigestion/heartburn.).    clopidogrel (PLAVIX) 75 MG tablet Take 1 tablet (75 mg total) by mouth daily.    diphenhydrAMINE (BENADRYL) 25 mg capsule Take 25 mg by mouth every 6 (six) hours as needed for allergies.    ECHINACEA-GOLDENSEAL PO Take 1 capsule by mouth in the morning and at bedtime.    fluticasone (FLONASE) 50 MCG/ACT nasal spray Place 2 sprays into both nostrils daily as needed.    insulin degludec (TRESIBA FLEXTOUCH) 100 UNIT/ML FlexTouch Pen Inject 20 Units into the skin at bedtime.    insulin lispro (HUMALOG) 100 UNIT/ML KwikPen SMARTSIG:5-10 Unit(s) SUB-Q 3 Times Daily    Insulin Pen Needle (NOVOFINE PEN NEEDLE) 32G X 6 MM MISC Use to inject insulin daily as directed.    lipase/protease/amylase (CREON) 36000 UNITS CPEP capsule Take 2 capsules (72,000 Units total) by mouth 3 (three) times daily with meals. May also take 1 capsule (36,000 Units total) as needed (with snacks).    losartan (COZAAR) 100 MG tablet Take 100 mg by mouth daily.    MILK THISTLE PO Take 1 tablet by mouth in the morning and at bedtime.    Multiple Vitamins-Minerals (MULTIVITAMIN ADULTS 50+) TABS Take 1 tablet by mouth daily in the afternoon.    pantoprazole (PROTONIX) 40 MG tablet Take 1 tablet (40 mg total) by mouth 2 (two) times daily before a meal.    rosuvastatin (CRESTOR) 10 MG tablet Take 1 tablet (10  mg total) by mouth daily.    simethicone (MYLICON) 80 MG chewable tablet Chew 80 mg by mouth in the morning, at noon, in the evening, and at bedtime. 01/14/2022: Takes 2 different doses of simethicone for 8 doses daily    Simethicone 125 MG CAPS Take 125 mg by mouth in the morning, at noon, in the evening, and at bedtime. 01/14/2022: Takes 2 different doses of simethicone for 8 doses daily   Coenzyme Q10 (COQ10) 100 MG CAPS Take 100 mg by mouth in the morning. (Patient not taking: Reported on 10/13/2022)    No facility-administered encounter medications on file as of 10/13/2022.    PAST  MEDICAL HISTORY: Past Medical History:  Diagnosis Date   Anemia    Anxiety    Arthritis    Cardiac arrhythmia    Diabetes mellitus without complication (HCC)    Hyperlipidemia    Hypertension    Necrotizing pancreatitis 02/2020   Pancreatic pseudocyst    Seizure (HCC)    pt doesnt remember but was told had one in hospital 2022   Stomach ulcer    Wears dentures    full upper, partial lower    PAST SURGICAL HISTORY: Past Surgical History:  Procedure Laterality Date   BIOPSY  05/20/2020   Procedure: BIOPSY;  Surgeon: Lemar Lofty., MD;  Location: [facility];  Service: Gastroenterology;;   BIOPSY  02/25/2021   Procedure: BIOPSY;  Surgeon: Lemar Lofty., MD;  Location: [facility] ENDOSCOPY;  Service: Gastroenterology;;   BIOPSY  06/24/2021   Procedure: BIOPSY;  Surgeon: Lemar Lofty., MD;  Location: [facility] ENDOSCOPY;  Service: Gastroenterology;;   CATARACT EXTRACTION W/PHACO Right 02/03/2022   Procedure: CATARACT EXTRACTION PHACO AND INTRAOCULAR LENS PLACEMENT (IOC) RIGHT DIABETIC  9.42  01:08.4;  Surgeon: Estanislado Pandy, MD;  Location: [facility];  Service: Ophthalmology;  Laterality: Right;  Diabetic   CATARACT EXTRACTION W/PHACO Left 03/03/2022   Procedure: CATARACT EXTRACTION PHACO AND INTRAOCULAR LENS PLACEMENT (IOC) LEFT DIABETIC  12.57  01:09.8;  Surgeon: Estanislado Pandy, MD;  Location: [facility];  Service: Ophthalmology;  Laterality: Left;   CYST GASTROSTOMY  06/24/2021   Procedure: NECROSECTOMY;  Surgeon: Meridee Score Netty Starring., MD;  Location: [facility] ENDOSCOPY;  Service: Gastroenterology;;   ESOPHAGOGASTRODUODENOSCOPY N/A 05/20/2020   Procedure: ESOPHAGOGASTRODUODENOSCOPY (EGD);  Surgeon: Lemar Lofty., MD;  Location: [facility];  Service: Gastroenterology;  Laterality: N/A;   ESOPHAGOGASTRODUODENOSCOPY N/A 01/18/2022   Procedure: ESOPHAGOGASTRODUODENOSCOPY (EGD);  Surgeon: Lemar Lofty., MD;  Location: [facility]  ENDOSCOPY;  Service: Gastroenterology;  Laterality: N/A;   ESOPHAGOGASTRODUODENOSCOPY (EGD) WITH PROPOFOL N/A 02/25/2021   Procedure: ESOPHAGOGASTRODUODENOSCOPY (EGD) WITH PROPOFOL;  Surgeon: Meridee Score Netty Starring., MD;  Location: [facility] ENDOSCOPY;  Service: Gastroenterology;  Laterality: N/A;   ESOPHAGOGASTRODUODENOSCOPY (EGD) WITH PROPOFOL N/A 06/24/2021   Procedure: ESOPHAGOGASTRODUODENOSCOPY (EGD) WITH PROPOFOL;  Surgeon: Meridee Score Netty Starring., MD;  Location: [facility] ENDOSCOPY;  Service: Gastroenterology;  Laterality: N/A;   EUS N/A 01/18/2022   Procedure: UPPER ENDOSCOPIC ULTRASOUND (EUS) LINEAR;  Surgeon: Lemar Lofty., MD;  Location: [facility] ENDOSCOPY;  Service: Gastroenterology;  Laterality: N/A;   FINE NEEDLE ASPIRATION N/A 01/18/2022   Procedure: FINE NEEDLE ASPIRATION (FNA) LINEAR;  Surgeon: Lemar Lofty., MD;  Location: [facility] ENDOSCOPY;  Service: Gastroenterology;  Laterality: N/A;   FOREIGN BODY REMOVAL  06/24/2021   Procedure: FOREIGN BODY REMOVAL;  Surgeon: Meridee Score Netty Starring., MD;  Location: [facility] ENDOSCOPY;  Service: Gastroenterology;;   LAPAROSCOPIC OVARIAN CYSTECTOMY     STENT REMOVAL  06/24/2021  Procedure: STENT REMOVAL;  Surgeon: Lemar Lofty., MD;  Location: [facility] ENDOSCOPY;  Service: Gastroenterology;;   TONSILLECTOMY     UPPER ESOPHAGEAL ENDOSCOPIC ULTRASOUND (EUS) N/A 05/20/2020   Procedure: UPPER ESOPHAGEAL ENDOSCOPIC ULTRASOUND (EUS)  ;  Surgeon: Lemar Lofty., MD;  Location: [facility];  Service: Gastroenterology;  Laterality: N/A;   UPPER ESOPHAGEAL ENDOSCOPIC ULTRASOUND (EUS) N/A 02/25/2021   Procedure: UPPER ESOPHAGEAL ENDOSCOPIC ULTRASOUND (EUS);  Surgeon: Lemar Lofty., MD;  Location: [facility] ENDOSCOPY;  Service: Gastroenterology;  Laterality: N/A;    ALLERGIES: Allergies  Allergen Reactions   Fentanyl Other (See Comments)    Patient preference, not an allergy    Morphine And Codeine Other (See Comments)    Patient preference, not  an allergy      FAMILY HISTORY: Family History  Problem Relation Age of Onset   Heart disease Mother    Kidney disease Mother    Diabetes Father    Pancreatic cancer Brother        Agent Orange   Gallstones Daughter    Colon cancer Son 19   Diabetes Paternal Uncle        x 2   Esophageal cancer Neg Hx    Inflammatory bowel disease Neg Hx    Liver disease Neg Hx    Stomach cancer Neg Hx     SOCIAL HISTORY: Social History   Tobacco Use   Smoking status: Every Day    Current packs/day: 0.25    Average packs/day: 0.3 packs/day for 60.0 years (15.0 ttl pk-yrs)    Types: Cigarettes   Smokeless tobacco: Never   Tobacco comments:    0.25 a pack a day (started age 7)///one or two cig a day 10/13/2022  Vaping Use   Vaping status: Never Used  Substance Use Topics   Alcohol use: Not Currently   Drug use: No   Social History   Social History Narrative   Are you right handed or left handed? right   Are you currently employed ?    What is your current occupation?retired   Do you live at home alone?yes   Who lives with you?    What type of home do you live in: 1 story or 2 story? one    Caffeine 1/1/2 a day     Objective:  Vital Signs:  BP 121/71   Pulse 67   Ht 5\' 6"  (1.676 m)   Wt 128 lb (58.1 kg)   SpO2 98%   BMI 20.66 kg/m   General: No acute distress.  Patient appears well-groomed.   Head:  Normocephalic/atraumatic Neck: supple, full range of motion Heart: regular rate and rhythm Lungs: Clear to auscultation bilaterally. Vascular: No carotid bruits.  Neurological Exam: Mental status: alert and oriented, speech fluent and not dysarthric, language intact.  Cranial nerves: CN I: not tested CN II: pupils equal, round and reactive to light, visual fields intact CN III, IV, VI:  full range of motion, no nystagmus, no ptosis CN V: facial sensation intact. CN VII: upper and lower face symmetric CN VIII: hearing intact CN IX, X: uvula midline CN XI:  sternocleidomastoid and trapezius muscles intact CN XII: tongue midline  Bulk & Tone: normal, no fasciculations. Motor:  muscle strength 5/5 throughout, except in LUE: FDI, APB, FPL, ADM, FDP 4/5 Deep Tendon Reflexes:  2+ throughout.   Sensation:  Pinprick sensation intact. Finger to nose testing:  Without dysmetria.   Heel to shin:  Without dysmetria.   Gait:  Normal  station and stride.  Romberg negative.   Labs and Imaging review: Internal labs: Lab Results  Component Value Date   HGBA1C 12.2 (H) 04/25/2022   No results found for: "VITAMINB12" Lab Results  Component Value Date   TSH 3.88 04/25/2022   Lab Results  Component Value Date   ESRSEDRATE 9 07/14/2020   External labs: 09/06/22: CBC unremarkable CMP significant for Cr 1.19   External Imaging: Transthoracic Echo W or WO Definity  Result Date: 09/04/2022 Left Ventricle: Left ventricle size is normal. Wall thickness is normal. Normal segmental wall motion. Normal systolic function. Normal diastolic function.   MRI Brain Wo Contrast  Result Date: 09/04/2022 EXAMINATION: MRI BRAIN WO CONTRAST HISTORY: Transient ischemic attack (TIA) COMPARISON: None TECHNIQUE: Multiplanar multisequence MR imaging of the brain was performed without contrast. FINDINGS: The ventricular system, basal cisterns, and cortical sulcal pattern are within normal limits for the patient's age. There are chronic small vessel ischemic changes. There is an acute/subacute infarct involving the right frontal lobe/centrum semiovale. There is no acute hemorrhage or mass effect. There is no midline shift.The sellar and suprasellar structures are unremarkable. The expected signal void is seen within the major intracranial vessels consistent with their patency. The skull base and craniocervical junction is unremarkable. The visualized orbits, paranasal sinuses, and mastoid complexes shows ethmoid mucosal disease.   IMPRESSION: Acute/subacute infarct involving the  right frontal lobe/centrum semiovale. Chronic ischemic changes No acute hemorrhage   CT CTA Code Neuro Head and Neck  Result Date: 09/03/2022 EXAMINATION: CT CTA CODE NEURO HEAD AND NECK HISTORY: Neuro deficit, acute, stroke suspected COMPARISON: None. TECHNIQUE: Thin section axial images of the neck were obtained following the CT angiogram protocol utilizing dose reduction technique. Image post processing and reconstructions were performed. The images were reconstructed in the sagittal and coronal planes. 3-D imaging was performed and reviewed on a workstation. FNDINGS: The images demonstrate mild atheromatous changes at the carotid artery bifurcations bilaterally. This extends into the internal carotid arteries bilaterally. There is no evidence for significant stenosis or stenosis greater than 50%. These values were obtained according to the NASCET protocol. Both internal carotid arteries are patent into the intracranial circulation. There is antegrade flow in the vertebral arteries bilaterally. The soft tissues of the neck are normal. There are no soft tissue masses. The larynx and airway are normal. The salivary glands are normal. The lung apices are unremarkable bilaterally. There are severe emphysematous changes within both lungs. Scarring and spiculated nodularities at the right lung base, further evaluation with a nonemergent contrast-enhanced CT of the chest is advised to better assess.   IMPRESSION: 1. No large vessel occlusion, aneurysm, or dissection   CT Code Neuro Brain WO Contrast  Result Date: 09/03/2022 EXAMINATION: CT CODE NEURO BRAIN WO CONT HISTORY: Neuro deficit, acute, stroke suspected COMPARISON: None TECHNIQUE: Axial slices at taken from the base of the skull to the vertex. No intravenous contrast agent is given. Reconstruction images were done. Individualized dose optimization technique was used FINDINGS: There is mild dilatation of the 4th ventricle as well as mild cerebellar  atrophy. Supratentorially, the midline structures are normal in position. There is mild dilatation of the lateral as well as the 3rd ventricles with corresponding widening of the cortical sulci representing mild cortical atrophy. There are bilateral basal ganglia calcifications. There are no extra-axial collections, bleed, space-occupying lesion, mass effect or evidence of acute infarction. The visualized orbits are symmetrical and intact bilaterally. The mastoid air cells are well aerated and are symmetrical bilaterally.  The visualized paranasal sinuses are clear. The calvarium is intact.   IMPRESSION: Mild cortical and cerebellar atrophy. Correlation with CT angiogram of the head and neck may be of further value if clinically indicated. Correlation with pre and post-contrast CT and/or MRI study may be of further value if clinically indicated. Correlation with CT perfusion scan may be of further value if clinically indicated. Correlation diffusion-weighted MR examination may be of further value if clinically indicated These results were given to Dr. Nedra Hai resident at the time of dictation on September 03, 2022 12:09 p.m..    Assessment/Plan:  Holly Navarro is a 77 y.o. female who presents after recent ischemic stroke in right frontal centrum semiovale (09/03/22). She has a relevant medical history of HTN, HLD, stroke (09/03/22), exocrine pancreatic insufficiency c/b DM, OA, anxiety, current smoker. Her neurological examination is pertinent for left hand weakness. Available diagnostic data is significant for MRI brain showing acute infarct in right frontal centrum semiovale. CTA head and neck showed no significant stenosis or occlusion. The etiology of patient's stroke is most likely small vessel disease due to HTN, HLD, DM, and smoking.   She has been on DAPT with asa 81 mg and Plavix 75 mg since hospitalization (~1.5 months). Most recent HbA1c was 12.2. Patient completed inpatient rehab and is continuing with  outpatient PT/OT.   PLAN: -Continue aspirin 81 mg daily -Stop plavix -Continue Crestor 10 mg daily -Continue PT/OT -Discussed importance of BP, cholesterol, DM control -Discussed smoking cessation -I don't see any contraindication to patient driving  -Return to clinic in 6 months  The impression above as well as the plan as outlined below were extensively discussed with the patient who voiced understanding. All questions were answered to their satisfaction.  When available, results of the above investigations and possible further recommendations will be communicated to the patient via telephone/MyChart. Patient to call office if not contacted after expected testing turnaround time.   Total time spent reviewing records, interview, history/exam, documentation, and coordination of care on day of encounter:  50 min   Thank you for allowing me to participate in patient's care.  If I can answer any additional questions, I would be pleased to do so.  Jacquelyne Balint, MD   CC: [name], NP [address] [place] [address]  CC: Referring provider: [name], NP [address]  [place] [address]

## 2022-10-06 DIAGNOSIS — E782 Mixed hyperlipidemia: Secondary | ICD-10-CM | POA: Diagnosis not present

## 2022-10-06 DIAGNOSIS — K8681 Exocrine pancreatic insufficiency: Secondary | ICD-10-CM | POA: Diagnosis not present

## 2022-10-06 DIAGNOSIS — I10 Essential (primary) hypertension: Secondary | ICD-10-CM | POA: Diagnosis not present

## 2022-10-06 DIAGNOSIS — K861 Other chronic pancreatitis: Secondary | ICD-10-CM | POA: Diagnosis not present

## 2022-10-06 DIAGNOSIS — E1365 Other specified diabetes mellitus with hyperglycemia: Secondary | ICD-10-CM | POA: Diagnosis not present

## 2022-10-10 DIAGNOSIS — K5900 Constipation, unspecified: Secondary | ICD-10-CM | POA: Diagnosis not present

## 2022-10-10 DIAGNOSIS — Z7902 Long term (current) use of antithrombotics/antiplatelets: Secondary | ICD-10-CM | POA: Diagnosis not present

## 2022-10-10 DIAGNOSIS — Z9181 History of falling: Secondary | ICD-10-CM | POA: Diagnosis not present

## 2022-10-10 DIAGNOSIS — I69334 Monoplegia of upper limb following cerebral infarction affecting left non-dominant side: Secondary | ICD-10-CM | POA: Diagnosis not present

## 2022-10-10 DIAGNOSIS — Z5982 Transportation insecurity: Secondary | ICD-10-CM | POA: Diagnosis not present

## 2022-10-10 DIAGNOSIS — Z7982 Long term (current) use of aspirin: Secondary | ICD-10-CM | POA: Diagnosis not present

## 2022-10-10 DIAGNOSIS — Z556 Problems related to health literacy: Secondary | ICD-10-CM | POA: Diagnosis not present

## 2022-10-10 DIAGNOSIS — F17210 Nicotine dependence, cigarettes, uncomplicated: Secondary | ICD-10-CM | POA: Diagnosis not present

## 2022-10-10 DIAGNOSIS — I2510 Atherosclerotic heart disease of native coronary artery without angina pectoris: Secondary | ICD-10-CM | POA: Diagnosis not present

## 2022-10-10 DIAGNOSIS — E1165 Type 2 diabetes mellitus with hyperglycemia: Secondary | ICD-10-CM | POA: Diagnosis not present

## 2022-10-10 DIAGNOSIS — Z794 Long term (current) use of insulin: Secondary | ICD-10-CM | POA: Diagnosis not present

## 2022-10-10 DIAGNOSIS — I10 Essential (primary) hypertension: Secondary | ICD-10-CM | POA: Diagnosis not present

## 2022-10-12 ENCOUNTER — Telehealth: Payer: Self-pay

## 2022-10-12 DIAGNOSIS — Z7982 Long term (current) use of aspirin: Secondary | ICD-10-CM | POA: Diagnosis not present

## 2022-10-12 DIAGNOSIS — I69334 Monoplegia of upper limb following cerebral infarction affecting left non-dominant side: Secondary | ICD-10-CM | POA: Diagnosis not present

## 2022-10-12 DIAGNOSIS — Z7902 Long term (current) use of antithrombotics/antiplatelets: Secondary | ICD-10-CM | POA: Diagnosis not present

## 2022-10-12 DIAGNOSIS — I2510 Atherosclerotic heart disease of native coronary artery without angina pectoris: Secondary | ICD-10-CM | POA: Diagnosis not present

## 2022-10-12 DIAGNOSIS — Z5982 Transportation insecurity: Secondary | ICD-10-CM | POA: Diagnosis not present

## 2022-10-12 DIAGNOSIS — Z794 Long term (current) use of insulin: Secondary | ICD-10-CM | POA: Diagnosis not present

## 2022-10-12 DIAGNOSIS — I10 Essential (primary) hypertension: Secondary | ICD-10-CM | POA: Diagnosis not present

## 2022-10-12 DIAGNOSIS — Z556 Problems related to health literacy: Secondary | ICD-10-CM | POA: Diagnosis not present

## 2022-10-12 DIAGNOSIS — Z9181 History of falling: Secondary | ICD-10-CM | POA: Diagnosis not present

## 2022-10-12 DIAGNOSIS — F17210 Nicotine dependence, cigarettes, uncomplicated: Secondary | ICD-10-CM | POA: Diagnosis not present

## 2022-10-12 DIAGNOSIS — E1165 Type 2 diabetes mellitus with hyperglycemia: Secondary | ICD-10-CM | POA: Diagnosis not present

## 2022-10-12 DIAGNOSIS — K5900 Constipation, unspecified: Secondary | ICD-10-CM | POA: Diagnosis not present

## 2022-10-12 NOTE — Telephone Encounter (Signed)
Plan of care forms have been placed in provider to be signed folder

## 2022-10-13 ENCOUNTER — Ambulatory Visit: Payer: Medicare Other | Admitting: Neurology

## 2022-10-13 ENCOUNTER — Encounter: Payer: Self-pay | Admitting: Neurology

## 2022-10-13 VITALS — BP 121/71 | HR 67 | Ht 66.0 in | Wt 128.0 lb

## 2022-10-13 DIAGNOSIS — E119 Type 2 diabetes mellitus without complications: Secondary | ICD-10-CM | POA: Diagnosis not present

## 2022-10-13 DIAGNOSIS — R29898 Other symptoms and signs involving the musculoskeletal system: Secondary | ICD-10-CM | POA: Diagnosis not present

## 2022-10-13 DIAGNOSIS — Z9181 History of falling: Secondary | ICD-10-CM | POA: Diagnosis not present

## 2022-10-13 DIAGNOSIS — Z7982 Long term (current) use of aspirin: Secondary | ICD-10-CM | POA: Diagnosis not present

## 2022-10-13 DIAGNOSIS — K5900 Constipation, unspecified: Secondary | ICD-10-CM | POA: Diagnosis not present

## 2022-10-13 DIAGNOSIS — Z794 Long term (current) use of insulin: Secondary | ICD-10-CM

## 2022-10-13 DIAGNOSIS — I63511 Cerebral infarction due to unspecified occlusion or stenosis of right middle cerebral artery: Secondary | ICD-10-CM | POA: Diagnosis not present

## 2022-10-13 DIAGNOSIS — I69334 Monoplegia of upper limb following cerebral infarction affecting left non-dominant side: Secondary | ICD-10-CM | POA: Diagnosis not present

## 2022-10-13 DIAGNOSIS — R200 Anesthesia of skin: Secondary | ICD-10-CM

## 2022-10-13 DIAGNOSIS — F17210 Nicotine dependence, cigarettes, uncomplicated: Secondary | ICD-10-CM | POA: Diagnosis not present

## 2022-10-13 DIAGNOSIS — Z7902 Long term (current) use of antithrombotics/antiplatelets: Secondary | ICD-10-CM | POA: Diagnosis not present

## 2022-10-13 DIAGNOSIS — I2510 Atherosclerotic heart disease of native coronary artery without angina pectoris: Secondary | ICD-10-CM | POA: Diagnosis not present

## 2022-10-13 DIAGNOSIS — I10 Essential (primary) hypertension: Secondary | ICD-10-CM

## 2022-10-13 DIAGNOSIS — E1165 Type 2 diabetes mellitus with hyperglycemia: Secondary | ICD-10-CM | POA: Diagnosis not present

## 2022-10-13 DIAGNOSIS — E785 Hyperlipidemia, unspecified: Secondary | ICD-10-CM

## 2022-10-13 DIAGNOSIS — Z556 Problems related to health literacy: Secondary | ICD-10-CM | POA: Diagnosis not present

## 2022-10-13 DIAGNOSIS — Z5982 Transportation insecurity: Secondary | ICD-10-CM | POA: Diagnosis not present

## 2022-10-13 NOTE — Patient Instructions (Signed)
I saw you today for your recent stroke that caused left arm weakness and numbness.  Continue aspirin 81 mg daily.  STOP Plavix 75 mg daily - you do not need this now.  Continue Crestor 10 mg daily for cholesterol.  Stopping smoking will help prevent future stroke risk.  Controlling blood pressure, cholesterol, and diabetes will also help reduce future stroke risk.  Continue physical therapy and occupational therapy  If you have new difficulty speaking, face droop, numbness on one side of the body, weakness on one side of the body, or dizziness/imbalance, this could be the sign of a stroke. Don't wait, please call EMS and be evaluated at the nearest emergency room.   I will see you back in clinic in 6 months or sooner if needed. Please let me know if you have any questions or concerns in the meantime.  The physicians and staff at [facility] are committed to providing excellent care. You may receive a survey requesting feedback about your experience at our office. We strive to receive "very good" responses to the survey questions. If you feel that your experience would prevent you from giving the office a "very good " response, please contact our office to try to remedy the situation. We may be reached at [phone]. Thank you for taking the time out of your busy day to complete the survey.  Jacquelyne Balint, MD [facility]

## 2022-10-14 NOTE — Telephone Encounter (Signed)
Forms faxed to given fax number [phone] with a completed transmission log

## 2022-10-17 DIAGNOSIS — Z5982 Transportation insecurity: Secondary | ICD-10-CM | POA: Diagnosis not present

## 2022-10-17 DIAGNOSIS — E1165 Type 2 diabetes mellitus with hyperglycemia: Secondary | ICD-10-CM | POA: Diagnosis not present

## 2022-10-17 DIAGNOSIS — Z7902 Long term (current) use of antithrombotics/antiplatelets: Secondary | ICD-10-CM | POA: Diagnosis not present

## 2022-10-17 DIAGNOSIS — F17210 Nicotine dependence, cigarettes, uncomplicated: Secondary | ICD-10-CM | POA: Diagnosis not present

## 2022-10-17 DIAGNOSIS — Z556 Problems related to health literacy: Secondary | ICD-10-CM | POA: Diagnosis not present

## 2022-10-17 DIAGNOSIS — Z9181 History of falling: Secondary | ICD-10-CM | POA: Diagnosis not present

## 2022-10-17 DIAGNOSIS — Z794 Long term (current) use of insulin: Secondary | ICD-10-CM | POA: Diagnosis not present

## 2022-10-17 DIAGNOSIS — I69334 Monoplegia of upper limb following cerebral infarction affecting left non-dominant side: Secondary | ICD-10-CM | POA: Diagnosis not present

## 2022-10-17 DIAGNOSIS — K5900 Constipation, unspecified: Secondary | ICD-10-CM | POA: Diagnosis not present

## 2022-10-17 DIAGNOSIS — I10 Essential (primary) hypertension: Secondary | ICD-10-CM | POA: Diagnosis not present

## 2022-10-17 DIAGNOSIS — I2510 Atherosclerotic heart disease of native coronary artery without angina pectoris: Secondary | ICD-10-CM | POA: Diagnosis not present

## 2022-10-17 DIAGNOSIS — Z7982 Long term (current) use of aspirin: Secondary | ICD-10-CM | POA: Diagnosis not present

## 2022-10-19 DIAGNOSIS — I10 Essential (primary) hypertension: Secondary | ICD-10-CM | POA: Diagnosis not present

## 2022-10-19 DIAGNOSIS — Z9181 History of falling: Secondary | ICD-10-CM | POA: Diagnosis not present

## 2022-10-19 DIAGNOSIS — Z794 Long term (current) use of insulin: Secondary | ICD-10-CM | POA: Diagnosis not present

## 2022-10-19 DIAGNOSIS — E1165 Type 2 diabetes mellitus with hyperglycemia: Secondary | ICD-10-CM | POA: Diagnosis not present

## 2022-10-19 DIAGNOSIS — K5900 Constipation, unspecified: Secondary | ICD-10-CM | POA: Diagnosis not present

## 2022-10-19 DIAGNOSIS — Z5982 Transportation insecurity: Secondary | ICD-10-CM | POA: Diagnosis not present

## 2022-10-19 DIAGNOSIS — Z556 Problems related to health literacy: Secondary | ICD-10-CM | POA: Diagnosis not present

## 2022-10-19 DIAGNOSIS — Z7902 Long term (current) use of antithrombotics/antiplatelets: Secondary | ICD-10-CM | POA: Diagnosis not present

## 2022-10-19 DIAGNOSIS — F17210 Nicotine dependence, cigarettes, uncomplicated: Secondary | ICD-10-CM | POA: Diagnosis not present

## 2022-10-19 DIAGNOSIS — I69334 Monoplegia of upper limb following cerebral infarction affecting left non-dominant side: Secondary | ICD-10-CM | POA: Diagnosis not present

## 2022-10-19 DIAGNOSIS — Z7982 Long term (current) use of aspirin: Secondary | ICD-10-CM | POA: Diagnosis not present

## 2022-10-19 DIAGNOSIS — I2510 Atherosclerotic heart disease of native coronary artery without angina pectoris: Secondary | ICD-10-CM | POA: Diagnosis not present

## 2022-10-20 DIAGNOSIS — Z9181 History of falling: Secondary | ICD-10-CM | POA: Diagnosis not present

## 2022-10-20 DIAGNOSIS — I10 Essential (primary) hypertension: Secondary | ICD-10-CM | POA: Diagnosis not present

## 2022-10-20 DIAGNOSIS — K5900 Constipation, unspecified: Secondary | ICD-10-CM | POA: Diagnosis not present

## 2022-10-20 DIAGNOSIS — Z794 Long term (current) use of insulin: Secondary | ICD-10-CM | POA: Diagnosis not present

## 2022-10-20 DIAGNOSIS — E1165 Type 2 diabetes mellitus with hyperglycemia: Secondary | ICD-10-CM | POA: Diagnosis not present

## 2022-10-20 DIAGNOSIS — I2510 Atherosclerotic heart disease of native coronary artery without angina pectoris: Secondary | ICD-10-CM | POA: Diagnosis not present

## 2022-10-20 DIAGNOSIS — Z556 Problems related to health literacy: Secondary | ICD-10-CM | POA: Diagnosis not present

## 2022-10-20 DIAGNOSIS — Z7982 Long term (current) use of aspirin: Secondary | ICD-10-CM | POA: Diagnosis not present

## 2022-10-20 DIAGNOSIS — Z7902 Long term (current) use of antithrombotics/antiplatelets: Secondary | ICD-10-CM | POA: Diagnosis not present

## 2022-10-20 DIAGNOSIS — I69334 Monoplegia of upper limb following cerebral infarction affecting left non-dominant side: Secondary | ICD-10-CM | POA: Diagnosis not present

## 2022-10-20 DIAGNOSIS — F17210 Nicotine dependence, cigarettes, uncomplicated: Secondary | ICD-10-CM | POA: Diagnosis not present

## 2022-10-20 DIAGNOSIS — Z5982 Transportation insecurity: Secondary | ICD-10-CM | POA: Diagnosis not present

## 2022-10-21 ENCOUNTER — Other Ambulatory Visit: Payer: Self-pay | Admitting: Nurse Practitioner

## 2022-10-21 DIAGNOSIS — I639 Cerebral infarction, unspecified: Secondary | ICD-10-CM

## 2022-10-21 MED ORDER — ASPIRIN 81 MG PO TBEC
81.0000 mg | DELAYED_RELEASE_TABLET | Freq: Every day | ORAL | 3 refills | Status: AC
Start: 2022-10-21 — End: ?

## 2022-10-24 DIAGNOSIS — I10 Essential (primary) hypertension: Secondary | ICD-10-CM | POA: Diagnosis not present

## 2022-10-24 DIAGNOSIS — Z9181 History of falling: Secondary | ICD-10-CM | POA: Diagnosis not present

## 2022-10-24 DIAGNOSIS — Z556 Problems related to health literacy: Secondary | ICD-10-CM | POA: Diagnosis not present

## 2022-10-24 DIAGNOSIS — I69334 Monoplegia of upper limb following cerebral infarction affecting left non-dominant side: Secondary | ICD-10-CM | POA: Diagnosis not present

## 2022-10-24 DIAGNOSIS — Z5982 Transportation insecurity: Secondary | ICD-10-CM | POA: Diagnosis not present

## 2022-10-24 DIAGNOSIS — I2510 Atherosclerotic heart disease of native coronary artery without angina pectoris: Secondary | ICD-10-CM | POA: Diagnosis not present

## 2022-10-24 DIAGNOSIS — E1165 Type 2 diabetes mellitus with hyperglycemia: Secondary | ICD-10-CM | POA: Diagnosis not present

## 2022-10-24 DIAGNOSIS — Z7902 Long term (current) use of antithrombotics/antiplatelets: Secondary | ICD-10-CM | POA: Diagnosis not present

## 2022-10-24 DIAGNOSIS — F17210 Nicotine dependence, cigarettes, uncomplicated: Secondary | ICD-10-CM | POA: Diagnosis not present

## 2022-10-24 DIAGNOSIS — Z794 Long term (current) use of insulin: Secondary | ICD-10-CM | POA: Diagnosis not present

## 2022-10-24 DIAGNOSIS — Z7982 Long term (current) use of aspirin: Secondary | ICD-10-CM | POA: Diagnosis not present

## 2022-10-24 DIAGNOSIS — K5900 Constipation, unspecified: Secondary | ICD-10-CM | POA: Diagnosis not present

## 2022-10-28 DIAGNOSIS — K5900 Constipation, unspecified: Secondary | ICD-10-CM | POA: Diagnosis not present

## 2022-10-28 DIAGNOSIS — Z7982 Long term (current) use of aspirin: Secondary | ICD-10-CM | POA: Diagnosis not present

## 2022-10-28 DIAGNOSIS — I69334 Monoplegia of upper limb following cerebral infarction affecting left non-dominant side: Secondary | ICD-10-CM | POA: Diagnosis not present

## 2022-10-28 DIAGNOSIS — Z556 Problems related to health literacy: Secondary | ICD-10-CM | POA: Diagnosis not present

## 2022-10-28 DIAGNOSIS — Z5982 Transportation insecurity: Secondary | ICD-10-CM | POA: Diagnosis not present

## 2022-10-28 DIAGNOSIS — Z794 Long term (current) use of insulin: Secondary | ICD-10-CM | POA: Diagnosis not present

## 2022-10-28 DIAGNOSIS — Z9181 History of falling: Secondary | ICD-10-CM | POA: Diagnosis not present

## 2022-10-28 DIAGNOSIS — F17210 Nicotine dependence, cigarettes, uncomplicated: Secondary | ICD-10-CM | POA: Diagnosis not present

## 2022-10-28 DIAGNOSIS — I10 Essential (primary) hypertension: Secondary | ICD-10-CM | POA: Diagnosis not present

## 2022-10-28 DIAGNOSIS — Z7902 Long term (current) use of antithrombotics/antiplatelets: Secondary | ICD-10-CM | POA: Diagnosis not present

## 2022-10-28 DIAGNOSIS — I2510 Atherosclerotic heart disease of native coronary artery without angina pectoris: Secondary | ICD-10-CM | POA: Diagnosis not present

## 2022-10-28 DIAGNOSIS — E1165 Type 2 diabetes mellitus with hyperglycemia: Secondary | ICD-10-CM | POA: Diagnosis not present

## 2022-11-01 ENCOUNTER — Telehealth: Payer: Self-pay

## 2022-11-01 DIAGNOSIS — K5900 Constipation, unspecified: Secondary | ICD-10-CM | POA: Diagnosis not present

## 2022-11-01 DIAGNOSIS — E1165 Type 2 diabetes mellitus with hyperglycemia: Secondary | ICD-10-CM | POA: Diagnosis not present

## 2022-11-01 DIAGNOSIS — I2510 Atherosclerotic heart disease of native coronary artery without angina pectoris: Secondary | ICD-10-CM | POA: Diagnosis not present

## 2022-11-01 DIAGNOSIS — Z556 Problems related to health literacy: Secondary | ICD-10-CM | POA: Diagnosis not present

## 2022-11-01 DIAGNOSIS — Z794 Long term (current) use of insulin: Secondary | ICD-10-CM | POA: Diagnosis not present

## 2022-11-01 DIAGNOSIS — Z5982 Transportation insecurity: Secondary | ICD-10-CM | POA: Diagnosis not present

## 2022-11-01 DIAGNOSIS — Z7982 Long term (current) use of aspirin: Secondary | ICD-10-CM | POA: Diagnosis not present

## 2022-11-01 DIAGNOSIS — Z9181 History of falling: Secondary | ICD-10-CM | POA: Diagnosis not present

## 2022-11-01 DIAGNOSIS — Z7902 Long term (current) use of antithrombotics/antiplatelets: Secondary | ICD-10-CM | POA: Diagnosis not present

## 2022-11-01 DIAGNOSIS — F17210 Nicotine dependence, cigarettes, uncomplicated: Secondary | ICD-10-CM | POA: Diagnosis not present

## 2022-11-01 DIAGNOSIS — I69334 Monoplegia of upper limb following cerebral infarction affecting left non-dominant side: Secondary | ICD-10-CM | POA: Diagnosis not present

## 2022-11-01 DIAGNOSIS — I10 Essential (primary) hypertension: Secondary | ICD-10-CM | POA: Diagnosis not present

## 2022-11-01 NOTE — Telephone Encounter (Signed)
[employer] forms have been received and placed in provider to be signed folder

## 2022-11-07 NOTE — Telephone Encounter (Signed)
Forms faxed to the given fax number [phone] with a completed transmission log

## 2022-11-08 DIAGNOSIS — I2510 Atherosclerotic heart disease of native coronary artery without angina pectoris: Secondary | ICD-10-CM | POA: Diagnosis not present

## 2022-11-08 DIAGNOSIS — I69334 Monoplegia of upper limb following cerebral infarction affecting left non-dominant side: Secondary | ICD-10-CM | POA: Diagnosis not present

## 2022-11-08 DIAGNOSIS — E1165 Type 2 diabetes mellitus with hyperglycemia: Secondary | ICD-10-CM | POA: Diagnosis not present

## 2022-11-08 DIAGNOSIS — I10 Essential (primary) hypertension: Secondary | ICD-10-CM | POA: Diagnosis not present

## 2022-11-08 DIAGNOSIS — K5900 Constipation, unspecified: Secondary | ICD-10-CM | POA: Diagnosis not present

## 2022-11-08 DIAGNOSIS — Z556 Problems related to health literacy: Secondary | ICD-10-CM | POA: Diagnosis not present

## 2022-11-08 DIAGNOSIS — Z5982 Transportation insecurity: Secondary | ICD-10-CM | POA: Diagnosis not present

## 2022-11-08 DIAGNOSIS — F17210 Nicotine dependence, cigarettes, uncomplicated: Secondary | ICD-10-CM | POA: Diagnosis not present

## 2022-11-08 DIAGNOSIS — Z7902 Long term (current) use of antithrombotics/antiplatelets: Secondary | ICD-10-CM | POA: Diagnosis not present

## 2022-11-08 DIAGNOSIS — Z9181 History of falling: Secondary | ICD-10-CM | POA: Diagnosis not present

## 2022-11-08 DIAGNOSIS — Z7982 Long term (current) use of aspirin: Secondary | ICD-10-CM | POA: Diagnosis not present

## 2022-11-08 DIAGNOSIS — Z794 Long term (current) use of insulin: Secondary | ICD-10-CM | POA: Diagnosis not present

## 2022-11-09 DIAGNOSIS — I69334 Monoplegia of upper limb following cerebral infarction affecting left non-dominant side: Secondary | ICD-10-CM | POA: Diagnosis not present

## 2022-11-09 DIAGNOSIS — Z7902 Long term (current) use of antithrombotics/antiplatelets: Secondary | ICD-10-CM | POA: Diagnosis not present

## 2022-11-09 DIAGNOSIS — I10 Essential (primary) hypertension: Secondary | ICD-10-CM | POA: Diagnosis not present

## 2022-11-09 DIAGNOSIS — K5900 Constipation, unspecified: Secondary | ICD-10-CM | POA: Diagnosis not present

## 2022-11-09 DIAGNOSIS — Z794 Long term (current) use of insulin: Secondary | ICD-10-CM | POA: Diagnosis not present

## 2022-11-09 DIAGNOSIS — F17210 Nicotine dependence, cigarettes, uncomplicated: Secondary | ICD-10-CM | POA: Diagnosis not present

## 2022-11-09 DIAGNOSIS — Z556 Problems related to health literacy: Secondary | ICD-10-CM | POA: Diagnosis not present

## 2022-11-09 DIAGNOSIS — E1165 Type 2 diabetes mellitus with hyperglycemia: Secondary | ICD-10-CM | POA: Diagnosis not present

## 2022-11-09 DIAGNOSIS — I2510 Atherosclerotic heart disease of native coronary artery without angina pectoris: Secondary | ICD-10-CM | POA: Diagnosis not present

## 2022-11-09 DIAGNOSIS — Z7982 Long term (current) use of aspirin: Secondary | ICD-10-CM | POA: Diagnosis not present

## 2022-11-09 DIAGNOSIS — Z9181 History of falling: Secondary | ICD-10-CM | POA: Diagnosis not present

## 2022-11-09 DIAGNOSIS — Z5982 Transportation insecurity: Secondary | ICD-10-CM | POA: Diagnosis not present

## 2022-11-17 DIAGNOSIS — E1165 Type 2 diabetes mellitus with hyperglycemia: Secondary | ICD-10-CM | POA: Diagnosis not present

## 2022-11-17 DIAGNOSIS — I69334 Monoplegia of upper limb following cerebral infarction affecting left non-dominant side: Secondary | ICD-10-CM | POA: Diagnosis not present

## 2022-11-17 DIAGNOSIS — Z794 Long term (current) use of insulin: Secondary | ICD-10-CM | POA: Diagnosis not present

## 2022-11-17 DIAGNOSIS — Z7902 Long term (current) use of antithrombotics/antiplatelets: Secondary | ICD-10-CM | POA: Diagnosis not present

## 2022-11-17 DIAGNOSIS — Z9181 History of falling: Secondary | ICD-10-CM | POA: Diagnosis not present

## 2022-11-17 DIAGNOSIS — Z7982 Long term (current) use of aspirin: Secondary | ICD-10-CM | POA: Diagnosis not present

## 2022-11-17 DIAGNOSIS — K5900 Constipation, unspecified: Secondary | ICD-10-CM | POA: Diagnosis not present

## 2022-11-17 DIAGNOSIS — Z556 Problems related to health literacy: Secondary | ICD-10-CM | POA: Diagnosis not present

## 2022-11-17 DIAGNOSIS — Z5982 Transportation insecurity: Secondary | ICD-10-CM | POA: Diagnosis not present

## 2022-11-17 DIAGNOSIS — F17210 Nicotine dependence, cigarettes, uncomplicated: Secondary | ICD-10-CM | POA: Diagnosis not present

## 2022-11-17 DIAGNOSIS — I2510 Atherosclerotic heart disease of native coronary artery without angina pectoris: Secondary | ICD-10-CM | POA: Diagnosis not present

## 2022-11-17 DIAGNOSIS — I10 Essential (primary) hypertension: Secondary | ICD-10-CM | POA: Diagnosis not present

## 2022-11-18 ENCOUNTER — Encounter: Payer: Self-pay | Admitting: Gastroenterology

## 2022-11-23 ENCOUNTER — Ambulatory Visit: Payer: Medicare Other | Admitting: Gastroenterology

## 2022-11-24 DIAGNOSIS — Z7902 Long term (current) use of antithrombotics/antiplatelets: Secondary | ICD-10-CM | POA: Diagnosis not present

## 2022-11-24 DIAGNOSIS — Z7982 Long term (current) use of aspirin: Secondary | ICD-10-CM | POA: Diagnosis not present

## 2022-11-24 DIAGNOSIS — Z556 Problems related to health literacy: Secondary | ICD-10-CM | POA: Diagnosis not present

## 2022-11-24 DIAGNOSIS — I69334 Monoplegia of upper limb following cerebral infarction affecting left non-dominant side: Secondary | ICD-10-CM | POA: Diagnosis not present

## 2022-11-24 DIAGNOSIS — E1165 Type 2 diabetes mellitus with hyperglycemia: Secondary | ICD-10-CM | POA: Diagnosis not present

## 2022-11-24 DIAGNOSIS — K5900 Constipation, unspecified: Secondary | ICD-10-CM | POA: Diagnosis not present

## 2022-11-24 DIAGNOSIS — I2510 Atherosclerotic heart disease of native coronary artery without angina pectoris: Secondary | ICD-10-CM | POA: Diagnosis not present

## 2022-11-24 DIAGNOSIS — Z9181 History of falling: Secondary | ICD-10-CM | POA: Diagnosis not present

## 2022-11-24 DIAGNOSIS — Z5982 Transportation insecurity: Secondary | ICD-10-CM | POA: Diagnosis not present

## 2022-11-24 DIAGNOSIS — Z794 Long term (current) use of insulin: Secondary | ICD-10-CM | POA: Diagnosis not present

## 2022-11-24 DIAGNOSIS — I10 Essential (primary) hypertension: Secondary | ICD-10-CM | POA: Diagnosis not present

## 2022-11-24 DIAGNOSIS — F17210 Nicotine dependence, cigarettes, uncomplicated: Secondary | ICD-10-CM | POA: Diagnosis not present

## 2022-11-29 ENCOUNTER — Ambulatory Visit
Admission: RE | Admit: 2022-11-29 | Discharge: 2022-11-29 | Disposition: A | Discharge status: Home / Self Care | Payer: Medicare Other | Source: Ambulatory Visit | Attending: Gastroenterology | Admitting: Gastroenterology

## 2022-11-29 DIAGNOSIS — K862 Cyst of pancreas: Secondary | ICD-10-CM | POA: Diagnosis not present

## 2022-11-29 DIAGNOSIS — K8689 Other specified diseases of pancreas: Secondary | ICD-10-CM | POA: Diagnosis not present

## 2022-11-29 DIAGNOSIS — Z8719 Personal history of other diseases of the digestive system: Secondary | ICD-10-CM | POA: Diagnosis not present

## 2022-11-29 DIAGNOSIS — I7143 Infrarenal abdominal aortic aneurysm, without rupture: Secondary | ICD-10-CM | POA: Diagnosis not present

## 2022-11-29 LAB — POCT I-STAT CREATININE: Creatinine, Ser: 1.4 mg/dL — ABNORMAL HIGH (ref 0.44–1.00)

## 2022-11-29 MED ORDER — IOHEXOL 300 MG/ML  SOLN
100.0000 mL | Freq: Once | INTRAMUSCULAR | Status: AC | PRN
  Administered 2022-11-29: 80 mL via INTRAVENOUS

## 2022-12-02 ENCOUNTER — Encounter: Payer: Self-pay | Admitting: Nurse Practitioner

## 2022-12-04 IMAGING — CT CT CHEST-ABD-PELV W/ CM
2 of 5 series · 13 of 36 positions shown, 15 images · IV contrast (omnipaque)
Comparison: February 29, 2020

CLINICAL DATA: Abdominal pain

EXAM:
CT CHEST, ABDOMEN, AND PELVIS WITH CONTRAST
TECHNIQUE: Multidetector CT imaging of the chest, abdomen and pelvis was
performed following the standard protocol during bolus
administration of intravenous contrast.
CONTRAST:  100mL OMNIPAQUE IOHEXOL 300 MG/ML  SOLN

[Series 2: cap with · axial · 0.84mm/px · z∈[-572,-67]mm · 10 of 125 slices shown, 12 images]
[im 12/125  mediastinal]
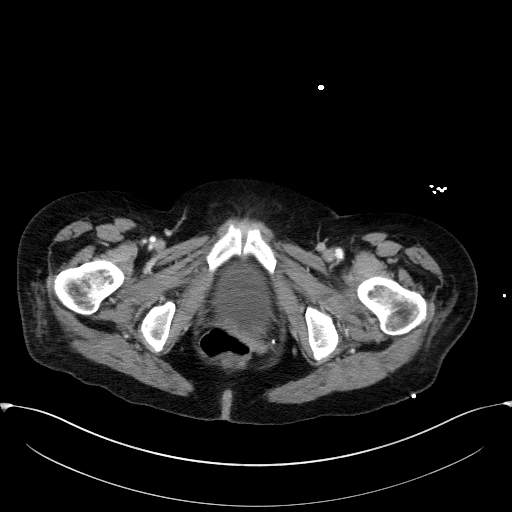
[im 12/125  bone]
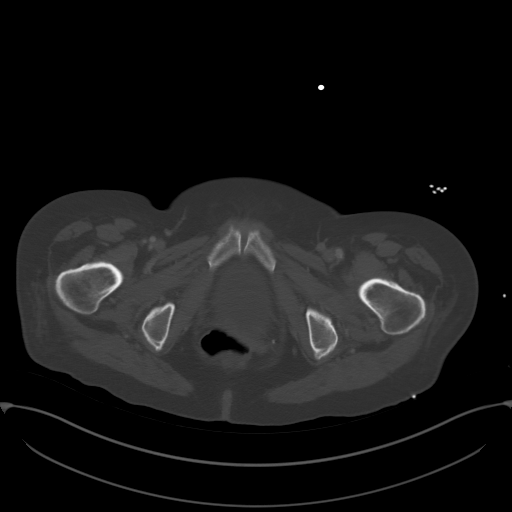
[im 23/125  mediastinal]
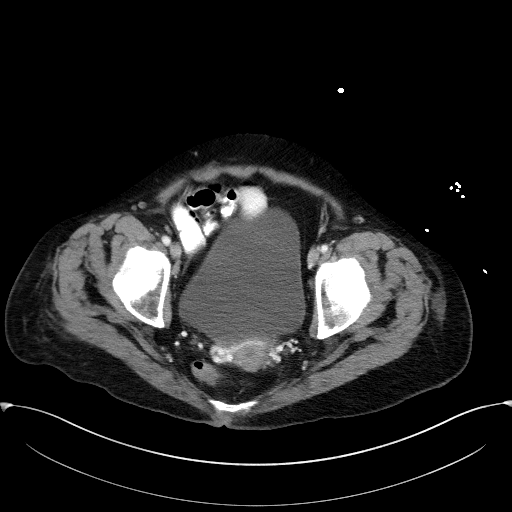
[im 34/125  mediastinal]
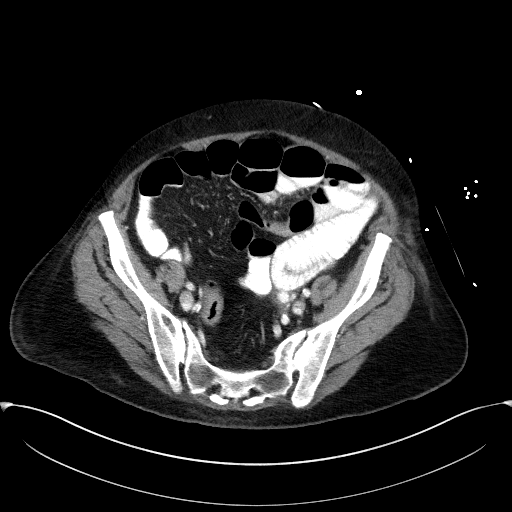
[im 46/125  mediastinal]
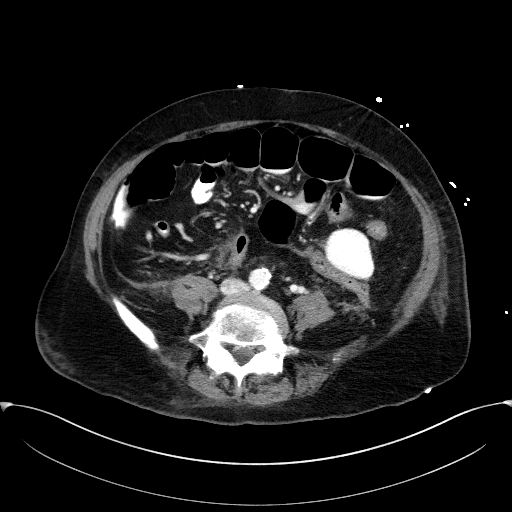
[im 57/125  mediastinal]
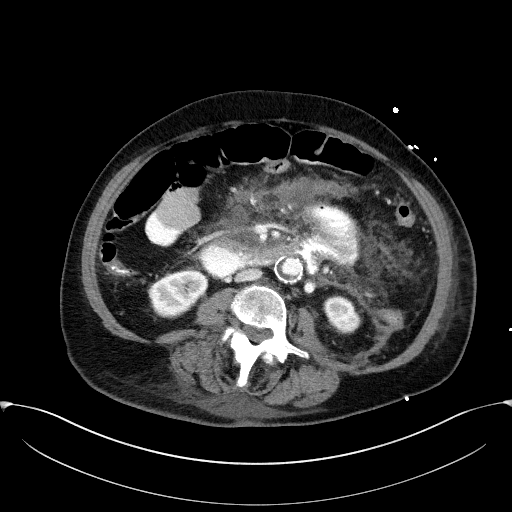
[im 68/125  mediastinal]
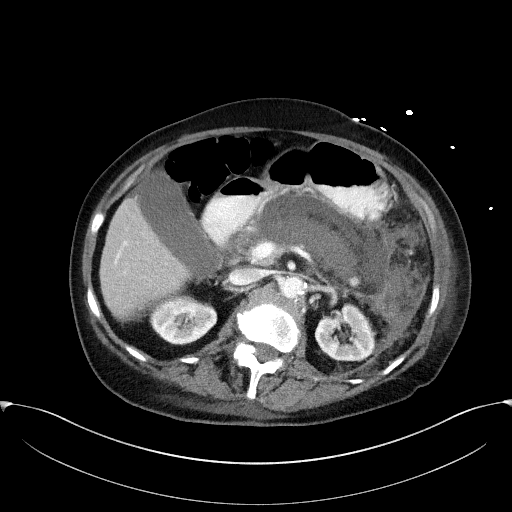
[im 79/125  mediastinal]
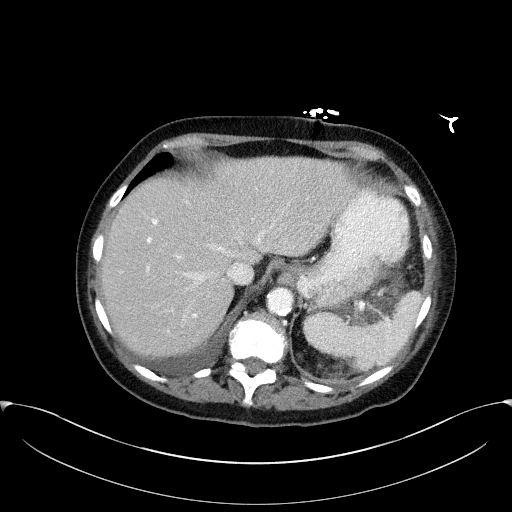
[im 91/125  mediastinal]
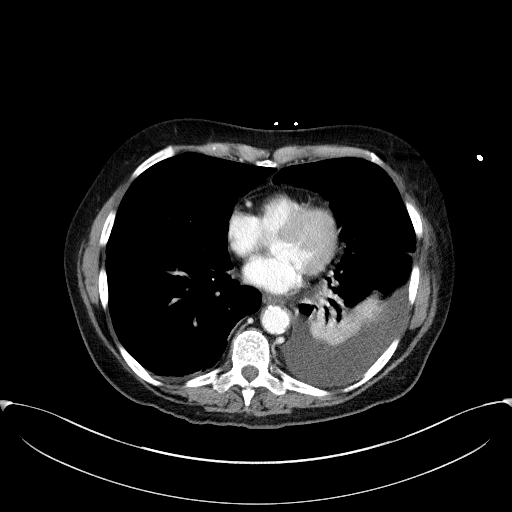
[im 102/125  mediastinal]
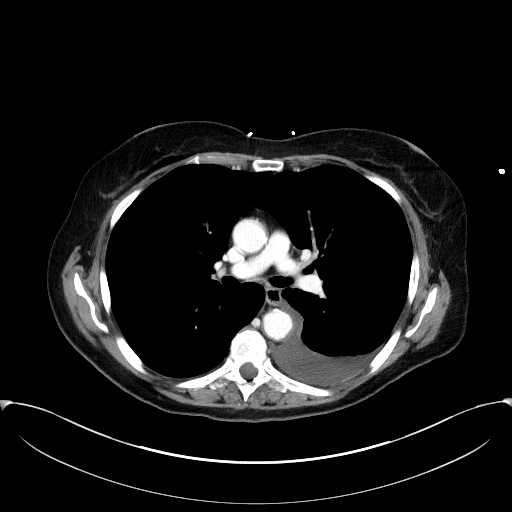
[im 102/125  bone]
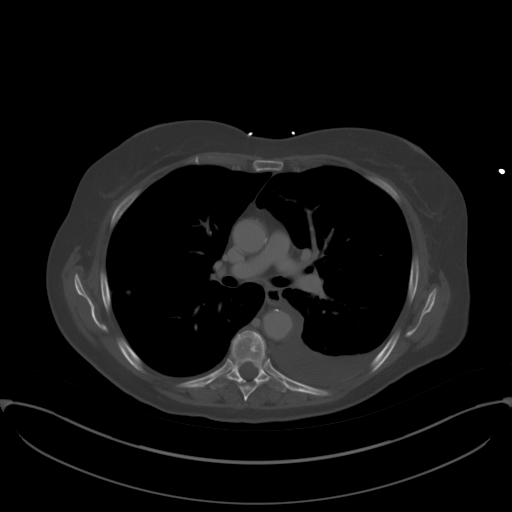
[im 113/125  mediastinal]
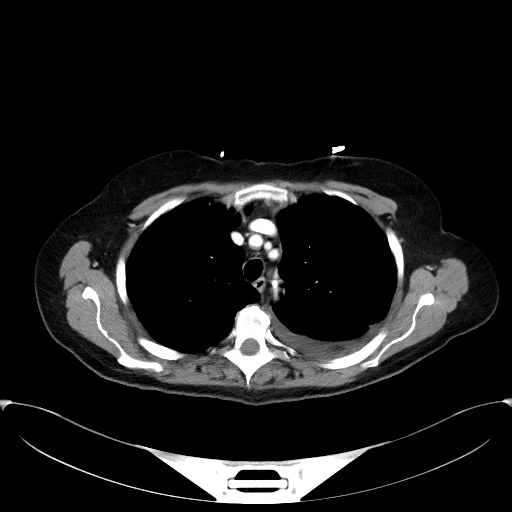

[Series 5: coronals · coronal · 0.72mm/px · 3 of 147 slices shown]
[im 30/147  mediastinal]
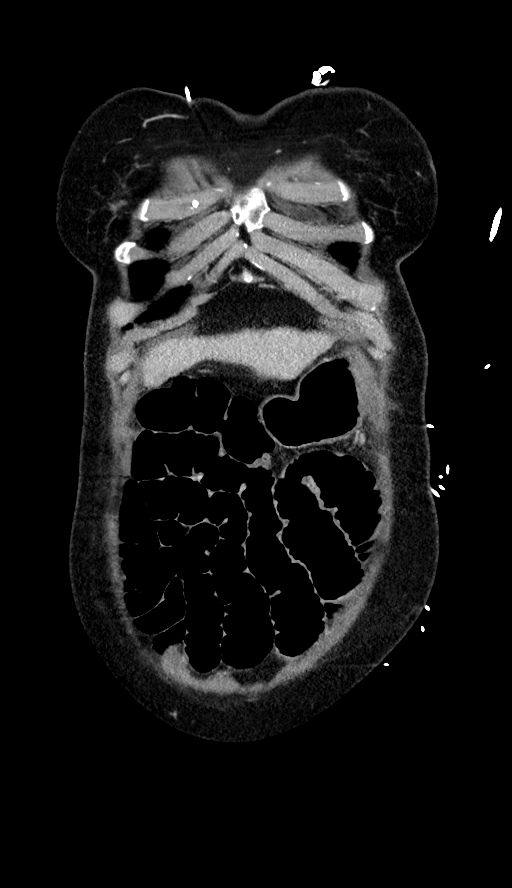
[im 59/147  mediastinal]
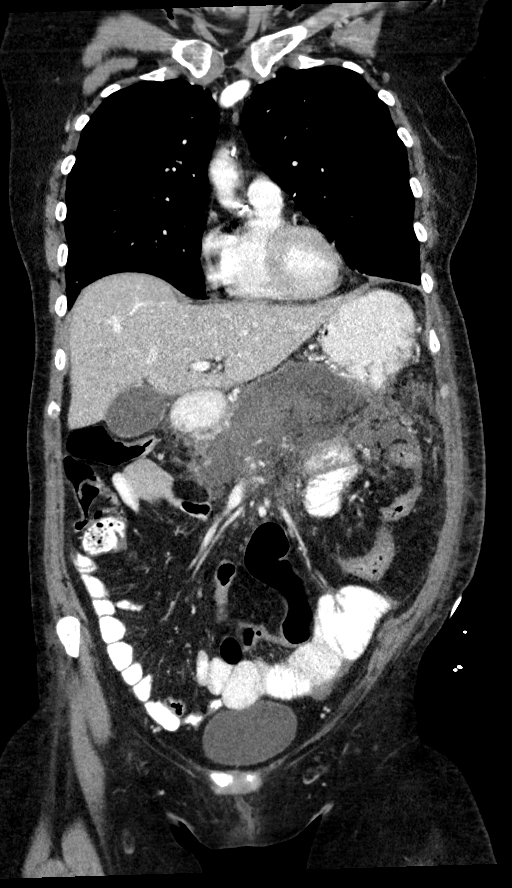
[im 88/147  mediastinal]
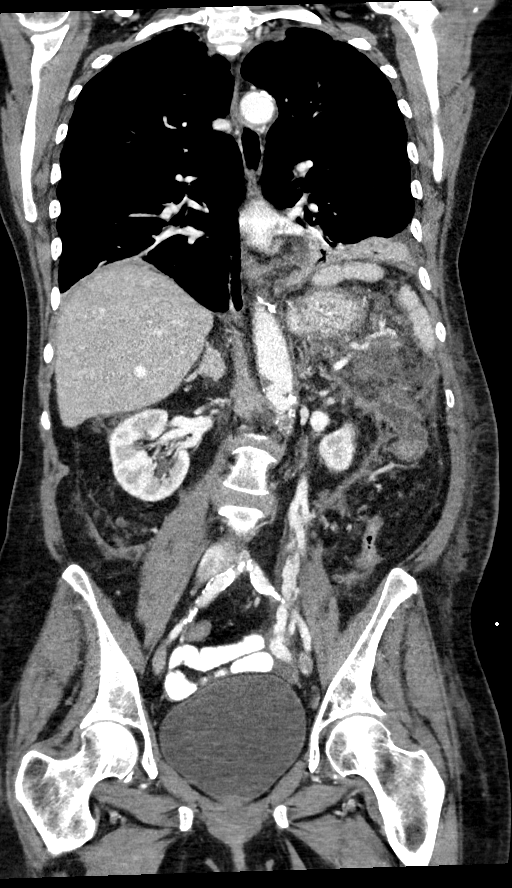

[13 of 36 positions shown; findings below may reference images not displayed]

FINDINGS: CT CHEST FINDINGS

Cardiovascular: Heart is normal in size. Predominately LEFT-sided
coronary artery atherosclerotic calcifications. Calcifications of
the aortic valve. Soft and calcified plaque throughout the aorta.

Mediastinum/Nodes: Thyroid is unremarkable. No axillary or
mediastinal adenopathy.

Lungs/Pleura: Small LEFT pleural effusion, increased in comparison
to prior. Biapical irregular opacities likely scar. Centrilobular
emphysema, moderate. RIGHT lower lobe pulmonary nodule measures 6 x
5 mm (series 2, image 24). RIGHT middle lobe pulmonary nodule
measures 5 mm (series 4, image 98). LEFT basilar atelectasis.

Musculoskeletal: Degenerative changes of the thoracic spine.

CT ABDOMEN PELVIS FINDINGS

Hepatobiliary: Focal fatty deposition adjacent to the falciform
ligament. Portal vein is patent. Gallbladder is mildly distended.
Trace adjacent pericholecystic fluid, likely reactive.

Pancreas: Majority of the pancreas is hypoattenuating with minimal
normally enhancing pancreas at the uncinate process and a small
focus at the tail. There is exuberant adjacent fat stranding and a
small amount of adjacent fluid. No definitive rim enhancing
organized collection. No focal drainable fluid collection at this
point in time. No definitive evidence of pseudoaneurysm formation at
this point in time.

Spleen: Unremarkable. Splenic vein is patent. Splenic artery is
patent.

Adrenals/Urinary Tract: Adrenal glands are unremarkable. Kidneys
enhance symmetrically. No hydronephrosis. Bladder is unremarkable.

Stomach/Bowel: No evidence of bowel obstruction. Mild prominence of
the small bowel is likely due to mild ileus from inflammatory
changes. Appendix is normal. Mild bowel wall thickening of the
transverse colon and splenic flexure, likely reactive in etiology.

Vascular/Lymphatic: Revisualization of an infrarenal abdominal
aortic aneurysm measuring up to 31 mm. Severe soft and calcified
plaque throughout the aorta.

Reproductive: Fibroid uterus.  Prominent parametrial vessels.

Other: Small volume free fluid throughout the abdomen with evolving
post inflammatory changes along the pericolic gutters.

Musculoskeletal: Degenerative changes of the lumbar spine.
Levocurvature of the lumbar spine.
IMPRESSION: 1. Revisualization of necrotic pancreatitis with exuberant adjacent
fat stranding and scattered free fluid throughout the abdomen. No
focal drainable fluid collection or pseudoaneurysm at this point in
time.
2. Small LEFT pleural effusion, increased in comparison to prior.
3. Likely reactive mild ileus and bowel wall thickening of the
colon. No evidence of bowel obstruction.
4. Small pulmonary nodules measuring up to 6 mm. Recommend follow-up
CT in 1 year to assess for stability.
5. Revisualization of infrarenal abdominal aortic aneurysm measuring
up to 31 mm. Recommend follow-up every 3 years. This recommendation
follows ACR consensus guidelines: White Paper of the ACR Incidental
Findings Committee II on Vascular Findings. [HOSPITAL] 0875;

Aortic Atherosclerosis (A53TE-7GA.A) and Emphysema (A53TE-GG0.A).

## 2022-12-05 NOTE — Telephone Encounter (Signed)
Patty, Can we call today or tomorrow in the morning to get a urgent read since I am seeing the patient in follow-up clinic on Tuesday? Thanks. GM

## 2022-12-06 ENCOUNTER — Encounter: Payer: Self-pay | Admitting: Gastroenterology

## 2022-12-06 ENCOUNTER — Ambulatory Visit: Payer: Medicare Other | Admitting: Gastroenterology

## 2022-12-06 VITALS — BP 130/80 | HR 60 | Ht 66.0 in | Wt 132.0 lb

## 2022-12-06 DIAGNOSIS — Z8719 Personal history of other diseases of the digestive system: Secondary | ICD-10-CM

## 2022-12-06 DIAGNOSIS — K8689 Other specified diseases of pancreas: Secondary | ICD-10-CM

## 2022-12-06 DIAGNOSIS — K862 Cyst of pancreas: Secondary | ICD-10-CM

## 2022-12-06 DIAGNOSIS — R933 Abnormal findings on diagnostic imaging of other parts of digestive tract: Secondary | ICD-10-CM | POA: Diagnosis not present

## 2022-12-06 DIAGNOSIS — Z8673 Personal history of transient ischemic attack (TIA), and cerebral infarction without residual deficits: Secondary | ICD-10-CM

## 2022-12-06 IMAGING — US US ABDOMEN LIMITED RUQ/ASCITES
1 series · 15 of 24 positions shown · non-contrast
Comparison: None.

CLINICAL DATA: 74-year-old woman with pancreatitis presents today
measure radiology for paracentesis.

EXAM:
LIMITED ABDOMEN ULTRASOUND FOR ASCITES
TECHNIQUE: Limited ultrasound survey for ascites was performed in all four
abdominal quadrants.

[Series 1: us paracentesis · 24 acquisitions, 15 frames shown]
[im 1/24]
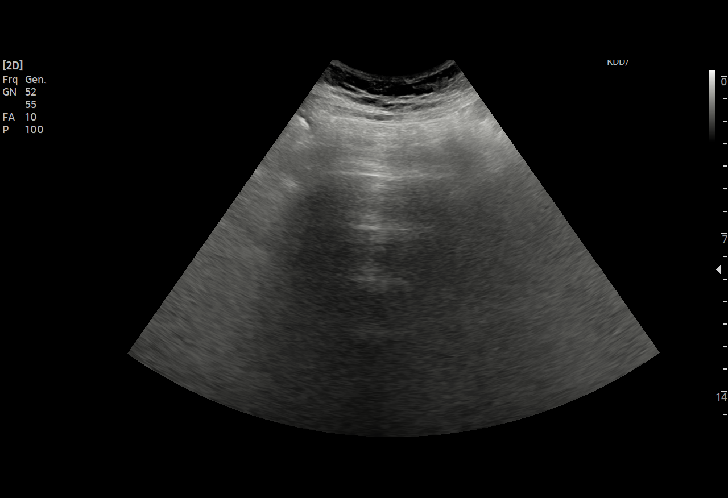
[im 3/24]
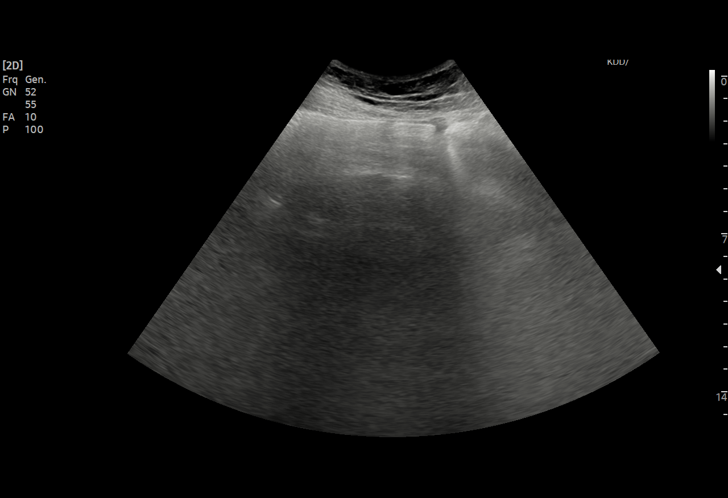
[im 5/24]
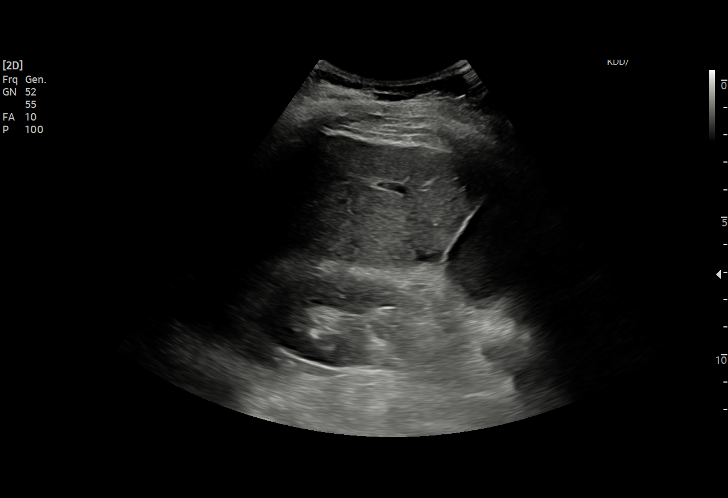
[im 6/24]
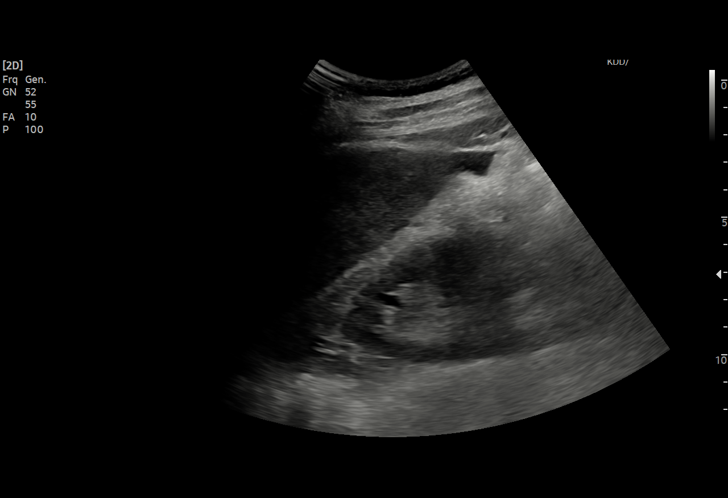
[im 8/24]
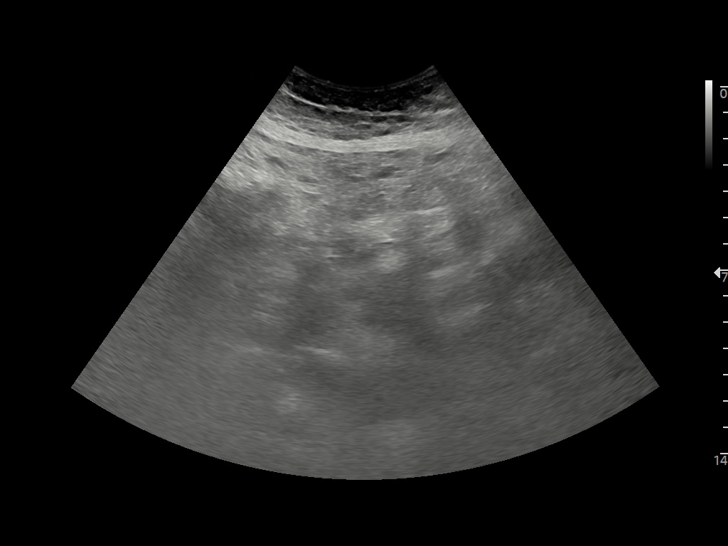
[im 9/24]
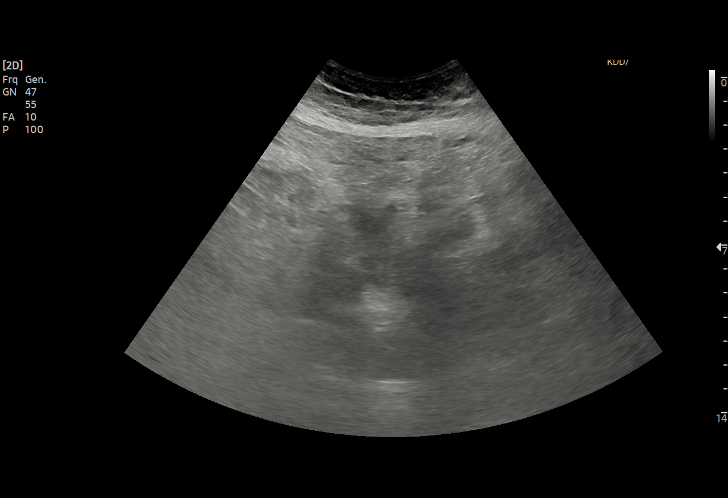
[im 11/24]
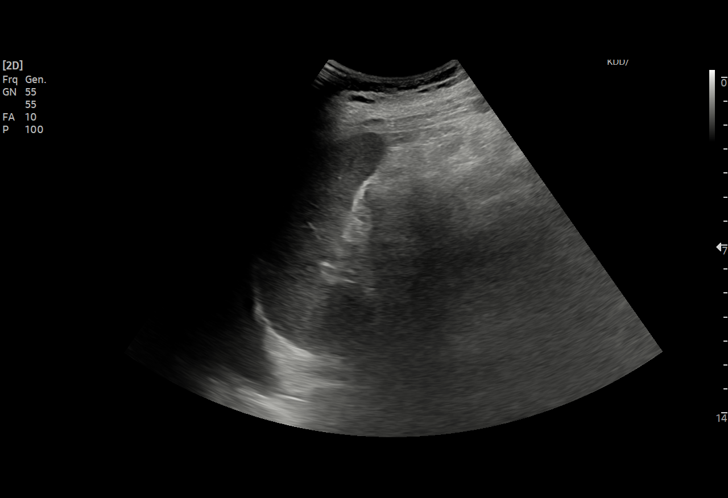
[im 13/24]
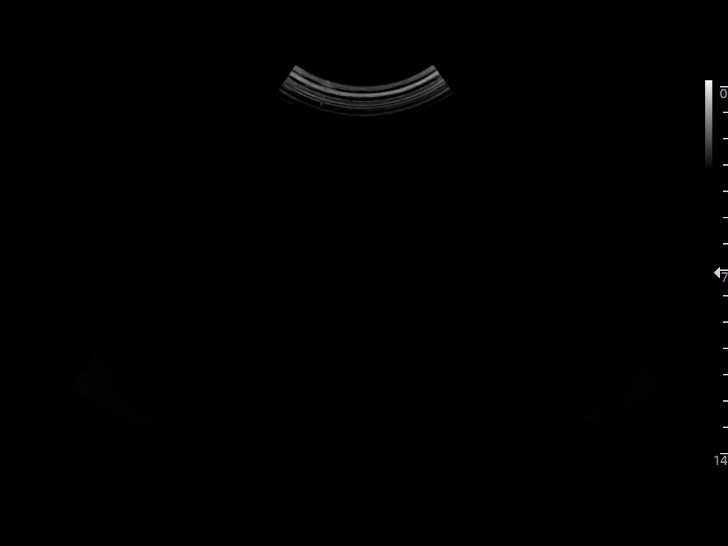
[im 14/24]
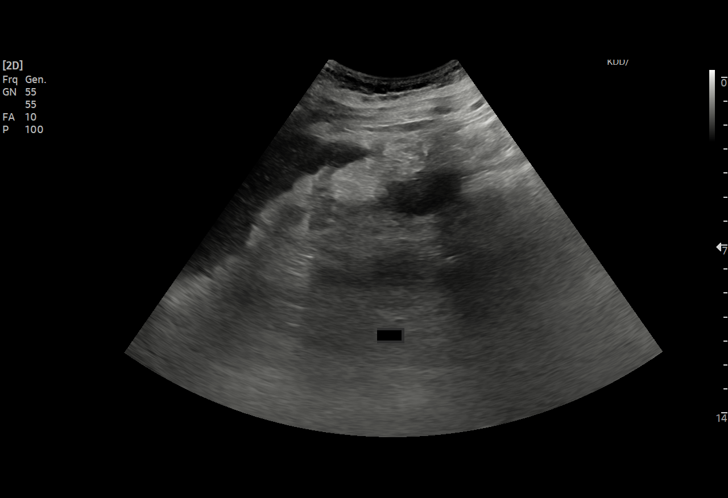
[im 16/24]
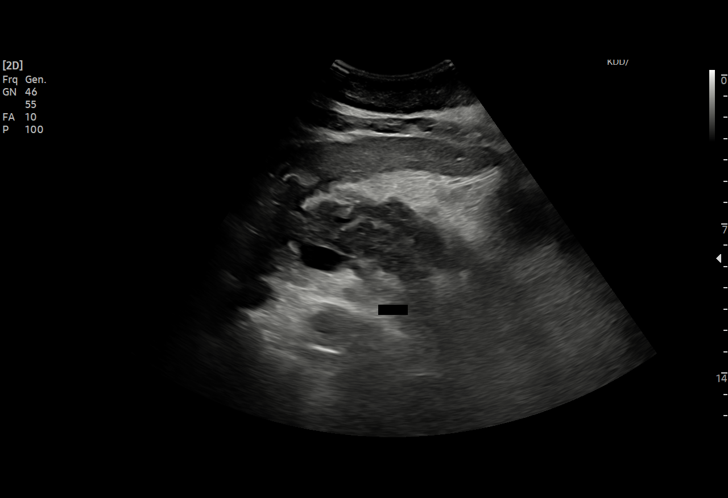
[im 17/24]
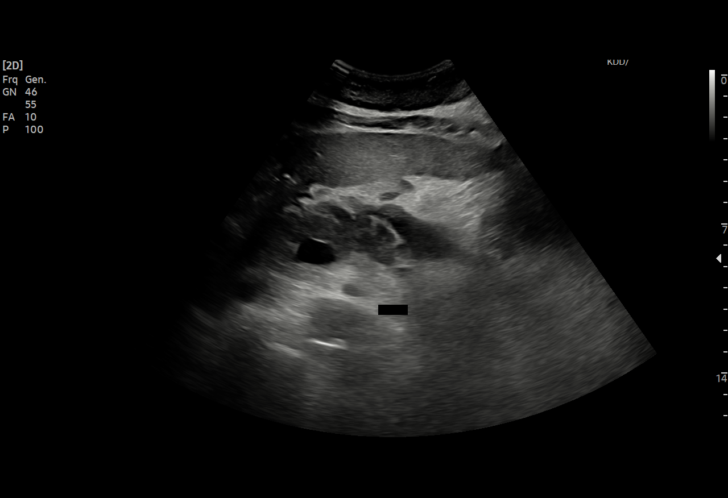
[im 19/24]
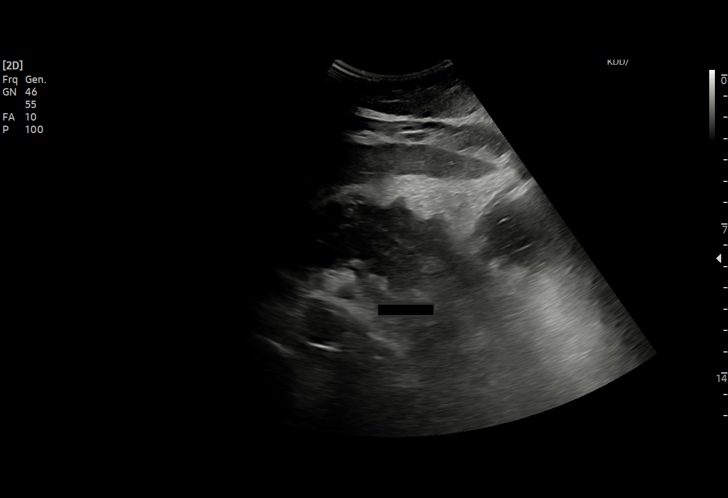
[im 21/24]
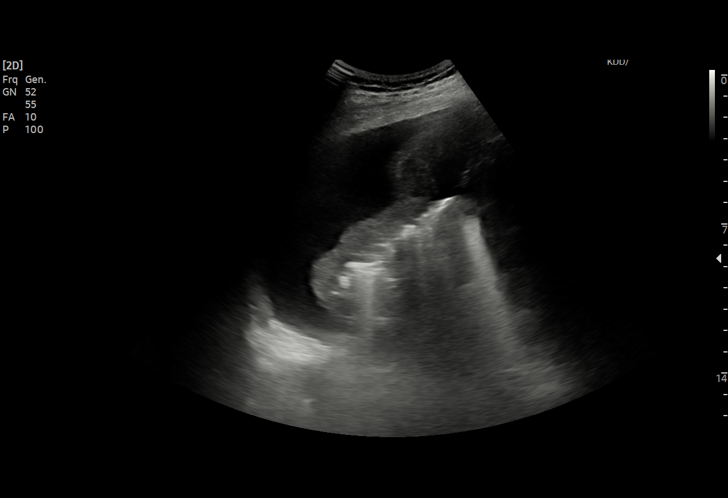
[im 22/24]
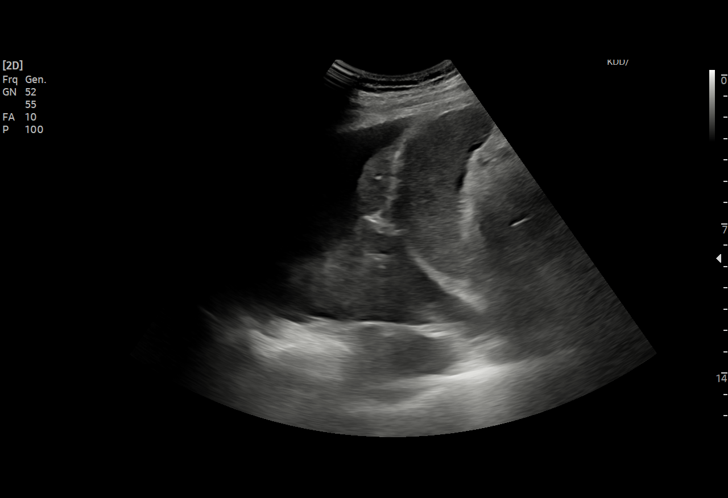
[im 24/24]
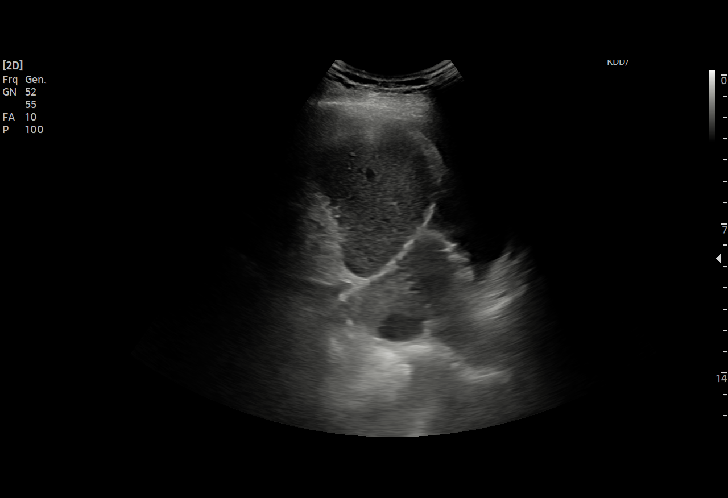

[15 of 24 positions shown; findings below may reference images not displayed]

FINDINGS: Sonographic evaluation of the abdomen demonstrates no ascites.
Peripancreatic inflammatory changes are seen. No discrete
peripancreatic fluid collection identified on ultrasound.

Small left pleural effusion also seen.
IMPRESSION: No drainable abdominal ascites.

## 2022-12-06 NOTE — Telephone Encounter (Signed)
Thank you Patty. Hopefully will get the results back and I can reach out to her or you can reach out to her tomorrow as they have not been read at the end of the day on Tuesday. Thanks. GM

## 2022-12-06 NOTE — Progress Notes (Unsigned)
GASTROENTEROLOGY OUTPATIENT CLINIC VISIT   Primary Care Provider [name], NP [address] [place] [address] [phone]   Patient Profile: Holly Navarro is a 77 y.o. female with a pmh significant for status post CVA, hypertension, hyperlipidemia anxiety, arthritis peptic ulcer disease, seizures, AAA, insulin-dependent diabetes (secondary to severe pancreatitis), family history colon cancer (son), family history of pancreas cancer (brother), idiopathic pancreatitis in 2022 (complicated pancreatic pseudocyst with walled off necrosis, splenic vein thrombosis-now decompressed and prior necrosectomy but now with recurrent pseudocyst now status post repeat drainage/aspiration).  The patient presents to the [facility] for an evaluation and management of problem(s) noted below:  Problem List 1. History of pancreatitis   2. Pancreatic cyst   3. Dilation of pancreatic duct   4. Abnormal CT scan, gastrointestinal tract   5. History of stroke     Discussed the use of AI scribe software for clinical note transcription with the patient, who gave verbal consent to proceed.  History of Present Illness Please see prior notes for full details of HPI.  Interval History The patient presents for follow-up.  Since I last saw her in clinic, she was in the [place] and unfortunately developed a stroke.  She was on Plavix initially and now only on aspirin.  The stroke resulted in dysphonia and some partial paresis, but she has been working with OT and reports significant improvement, with residual issues of hand coordination of her left hand.  From a GI perspective, she is able to eat what she can.  She is not having abdominal pain.  She has no nausea or vomiting.  While hospitalized however, with Lovenox shots being given to her abdomen, she developed significant swelling in her lower abdomen, though this is improving currently.  The patient denies any  gastrointestinal issues and reports regular bowel movements. She continues to take Creon for their gastrointestinal health.  Her diabetes control is still a work in progress.  She plans to discuss this further with her endocrinologist.  A CT scan that we ordered, and she completed a week ago, is still pending a read from Radiology department.   GI Review of Systems Positive as above Negative for dysphagia, odynophagia, alteration of bowel habits, melena, hematochezia   Review of Systems General: Denies fevers/chills Cardiovascular: Denies chest pain/palpitations Pulmonary: Denies shortness of breath Gastroenterological: See HPI Genitourinary: Denies darkened urine Hematological: Denies easy bruising/bleeding Dermatological: Denies jaundice Psychological: Mood is stable   Medications Current Outpatient Medications  Medication Sig Dispense Refill   aspirin EC 81 MG tablet Take 1 tablet (81 mg total) by mouth daily. 90 tablet 3   atenolol (TENORMIN) 50 MG tablet Take 1 tablet (50 mg total) by mouth daily. 90 tablet 3   blood glucose meter kit and supplies KIT Dispense based on patient and insurance preference. Use up to four times daily as directed. 1 each 0   cimetidine (TAGAMET) 200 MG tablet Take 200 mg by mouth as needed (indigestion/heartburn.).     Coenzyme Q10 (COQ10) 100 MG CAPS Take 100 mg by mouth in the morning.     diphenhydrAMINE (BENADRYL) 25 mg capsule Take 25 mg by mouth every 6 (six) hours as needed for allergies.     ECHINACEA-GOLDENSEAL PO Take 1 capsule by mouth in the morning and at bedtime.     fluticasone (FLONASE) 50 MCG/ACT nasal spray Place 2 sprays into both nostrils daily as needed. 16 g 1   insulin degludec (TRESIBA FLEXTOUCH) 100 UNIT/ML  FlexTouch Pen Inject 20 Units into the skin at bedtime. 15 mL 0   insulin lispro (HUMALOG) 100 UNIT/ML KwikPen SMARTSIG:5-10 Unit(s) SUB-Q 3 Times Daily     Insulin Pen Needle (NOVOFINE PEN NEEDLE) 32G X 6 MM MISC Use to  inject insulin daily as directed. 90 each 1   lipase/protease/amylase (CREON) 36000 UNITS CPEP capsule Take 2 capsules (72,000 Units total) by mouth 3 (three) times daily with meals. May also take 1 capsule (36,000 Units total) as needed (with snacks). 720 capsule 2   losartan (COZAAR) 100 MG tablet Take 100 mg by mouth daily.     MILK THISTLE PO Take 1 tablet by mouth in the morning and at bedtime.     Multiple Vitamins-Minerals (MULTIVITAMIN ADULTS 50+) TABS Take 1 tablet by mouth daily in the afternoon.     pantoprazole (PROTONIX) 40 MG tablet Take 1 tablet (40 mg total) by mouth 2 (two) times daily before a meal. 180 tablet 1   rosuvastatin (CRESTOR) 10 MG tablet Take 1 tablet (10 mg total) by mouth daily. 90 tablet 3   simethicone (MYLICON) 80 MG chewable tablet Chew 80 mg by mouth in the morning, at noon, in the evening, and at bedtime.     Simethicone 125 MG CAPS Take 125 mg by mouth in the morning, at noon, in the evening, and at bedtime.     amLODipine (NORVASC) 5 MG tablet Take 1 tablet (5 mg total) by mouth daily. 90 tablet 3   No current facility-administered medications for this visit.    Allergies Allergies  Allergen Reactions   Fentanyl Other (See Comments)    Patient preference, not an allergy    Morphine And Codeine Other (See Comments)    Patient preference, not an allergy      Histories Past Medical History:  Diagnosis Date   Anemia    Anxiety    Arthritis    Cardiac arrhythmia    Diabetes mellitus without complication (HCC)    Hyperlipidemia    Hypertension    Necrotizing pancreatitis 02/2020   Pancreatic pseudocyst    Seizure (HCC)    pt doesnt remember but was told had one in hospital 2022   Stomach ulcer    Stroke (HCC) 09/03/2022   Wears dentures    full upper, partial lower   Past Surgical History:  Procedure Laterality Date   BIOPSY  05/20/2020   Procedure: BIOPSY;  Surgeon: Lemar Lofty., MD;  Location: [facility];  Service:  Gastroenterology;;   BIOPSY  02/25/2021   Procedure: BIOPSY;  Surgeon: Lemar Lofty., MD;  Location: [facility] ENDOSCOPY;  Service: Gastroenterology;;   BIOPSY  06/24/2021   Procedure: BIOPSY;  Surgeon: Lemar Lofty., MD;  Location: [facility] ENDOSCOPY;  Service: Gastroenterology;;   CATARACT EXTRACTION W/PHACO Right 02/03/2022   Procedure: CATARACT EXTRACTION PHACO AND INTRAOCULAR LENS PLACEMENT (IOC) RIGHT DIABETIC  9.42  01:08.4;  Surgeon: Estanislado Pandy, MD;  Location: [facility];  Service: Ophthalmology;  Laterality: Right;  Diabetic   CATARACT EXTRACTION W/PHACO Left 03/03/2022   Procedure: CATARACT EXTRACTION PHACO AND INTRAOCULAR LENS PLACEMENT (IOC) LEFT DIABETIC  12.57  01:09.8;  Surgeon: Estanislado Pandy, MD;  Location: [facility];  Service: Ophthalmology;  Laterality: Left;   CYST GASTROSTOMY  06/24/2021   Procedure: NECROSECTOMY;  Surgeon: Meridee Score Netty Starring., MD;  Location: [facility] ENDOSCOPY;  Service: Gastroenterology;;   ESOPHAGOGASTRODUODENOSCOPY N/A 05/20/2020   Procedure: ESOPHAGOGASTRODUODENOSCOPY (EGD);  Surgeon: Meridee Score Netty Starring., MD;  Location: [facility] ENDOSCOPY;  Service: Gastroenterology;  Laterality: N/A;   ESOPHAGOGASTRODUODENOSCOPY N/A 01/18/2022   Procedure: ESOPHAGOGASTRODUODENOSCOPY (EGD);  Surgeon: Lemar Lofty., MD;  Location: [facility];  Service: Gastroenterology;  Laterality: N/A;   ESOPHAGOGASTRODUODENOSCOPY (EGD) WITH PROPOFOL N/A 02/25/2021   Procedure: ESOPHAGOGASTRODUODENOSCOPY (EGD) WITH PROPOFOL;  Surgeon: Meridee Score Netty Starring., MD;  Location: [facility] ENDOSCOPY;  Service: Gastroenterology;  Laterality: N/A;   ESOPHAGOGASTRODUODENOSCOPY (EGD) WITH PROPOFOL N/A 06/24/2021   Procedure: ESOPHAGOGASTRODUODENOSCOPY (EGD) WITH PROPOFOL;  Surgeon: Meridee Score Netty Starring., MD;  Location: [facility] ENDOSCOPY;  Service: Gastroenterology;  Laterality: N/A;   EUS N/A 01/18/2022   Procedure: UPPER ENDOSCOPIC ULTRASOUND (EUS) LINEAR;   Surgeon: Lemar Lofty., MD;  Location: [facility] ENDOSCOPY;  Service: Gastroenterology;  Laterality: N/A;   FINE NEEDLE ASPIRATION N/A 01/18/2022   Procedure: FINE NEEDLE ASPIRATION (FNA) LINEAR;  Surgeon: Lemar Lofty., MD;  Location: [facility] ENDOSCOPY;  Service: Gastroenterology;  Laterality: N/A;   FOREIGN BODY REMOVAL  06/24/2021   Procedure: FOREIGN BODY REMOVAL;  Surgeon: Meridee Score Netty Starring., MD;  Location: [facility] ENDOSCOPY;  Service: Gastroenterology;;   LAPAROSCOPIC OVARIAN CYSTECTOMY     STENT REMOVAL  06/24/2021   Procedure: STENT REMOVAL;  Surgeon: Lemar Lofty., MD;  Location: [facility] ENDOSCOPY;  Service: Gastroenterology;;   TONSILLECTOMY     UPPER ESOPHAGEAL ENDOSCOPIC ULTRASOUND (EUS) N/A 05/20/2020   Procedure: UPPER ESOPHAGEAL ENDOSCOPIC ULTRASOUND (EUS)  ;  Surgeon: Lemar Lofty., MD;  Location: [facility];  Service: Gastroenterology;  Laterality: N/A;   UPPER ESOPHAGEAL ENDOSCOPIC ULTRASOUND (EUS) N/A 02/25/2021   Procedure: UPPER ESOPHAGEAL ENDOSCOPIC ULTRASOUND (EUS);  Surgeon: Lemar Lofty., MD;  Location: [facility] ENDOSCOPY;  Service: Gastroenterology;  Laterality: N/A;   Social History   Socioeconomic History   Marital status: Widowed    Spouse name: Not on file   Number of children: 4   Years of education: Not on file   Highest education level: Some college, no degree  Occupational History   Occupation: retired  Tobacco Use   Smoking status: Every Day    Current packs/day: 0.25    Average packs/day: 0.3 packs/day for 60.0 years (15.0 ttl pk-yrs)    Types: Cigarettes   Smokeless tobacco: Never   Tobacco comments:    0.25 a pack a day (started age 33)///one or two cig a day 10/13/2022  Vaping Use   Vaping status: Never Used  Substance and Sexual Activity   Alcohol use: Not Currently   Drug use: No   Sexual activity: Not on file  Other Topics Concern   Not on file  Social History Narrative   Are you right handed or left handed?  right   Are you currently employed ?    What is your current occupation?retired   Do you live at home alone?yes   Who lives with you?    What type of home do you live in: 1 story or 2 story? one    Caffeine 1/1/2 a day    Social Determinants of Health   Financial Resource Strain: Low Risk  (04/25/2022)   Overall Financial Resource Strain (CARDIA)    Difficulty of Paying Living Expenses: Not hard at all  Food Insecurity: No Food Insecurity (09/03/2022)   Received from [employer] Insecurity    Worried About Running Out of Food in the Last Year: Not on file    [employer] of Food in the Last Year: Not on file    Do you have any concerns regarding your ability to purchase food for  yourself and/or your family?: Never true  Transportation Needs: No Transportation Needs (09/07/2022)   Received from [facility] - Transportation    Lack of Transportation (Medical): No    Lack of Transportation (Non-Medical): No  Physical Activity: Insufficiently Active (04/25/2022)   Exercise Vital Sign    Days of Exercise per Week: 7 days    Minutes of Exercise per Session: 10 min  Stress: No Stress Concern Present (04/25/2022)   [employer] of Occupational Health - Occupational Stress Questionnaire    Feeling of Stress : Only a little  Social Connections: Moderately Integrated (04/25/2022)   Social Connection and Isolation Panel [NHANES]    Frequency of Communication with Friends and Family: More than three times a week    Frequency of Social Gatherings with Friends and Family: Once a week    Attends Religious Services: More than 4 times per year    Active Member of [employer] or Organizations: Yes    Attends Banker Meetings: More than 4 times per year    Marital Status: Widowed  Intimate Partner Violence: Not At Risk (04/15/2022)   Humiliation, Afraid, Rape, and Kick questionnaire    Fear of Current or Ex-Partner: No    Emotionally Abused: No    Physically Abused: No     Sexually Abused: No   Family History  Problem Relation Age of Onset   Heart disease Mother    Kidney disease Mother    Diabetes Father    Pancreatic cancer Brother        Agent Orange   Gallstones Daughter    Colon cancer Son 35   Diabetes Paternal Uncle        x 2   Esophageal cancer Neg Hx    Inflammatory bowel disease Neg Hx    Liver disease Neg Hx    Stomach cancer Neg Hx    I have reviewed her medical, social, and family history in detail and updated the electronic medical record as necessary.    PHYSICAL EXAMINATION  BP 130/80   Pulse 60   Ht 5\' 6"  (1.676 m)   Wt 132 lb (59.9 kg)   BMI 21.31 kg/m  GEN: NAD, appears stated age, doesn't appear chronically ill PSYCH: Cooperative, without pressured speech EYE: Conjunctivae pink, sclerae anicteric ENT: MMM CV: Nontachycardic RESP: No audible wheezing GI: NABS, soft, areas of still healing Lovenox shots noted on lower abdomen, NT/ND, without rebound MSK/EXT: No lower extremity edema SKIN: No jaundice NEURO:  Alert & Oriented x 3, mild slurring of speech noted after long sentences, she shows me left hand dysarthria due to recent stroke   REVIEW OF DATA  I reviewed the following data at the time of this encounter:  GI Procedures and Studies  No new GI studies to review  Laboratory Studies  Reviewed those in epic and care everywhere  Imaging Studies  12/4 CT abdomen pancreas Pending read at this time My wet read suggests that the cystic appearing pancreas lesion is not currently present but I would want radiology to confirm this   ASSESSMENT  Ms. Tygart is a 77 y.o. female with a pmh significant for status post CVA, hypertension, hyperlipidemia anxiety, arthritis peptic ulcer disease, seizures, AAA, insulin-dependent diabetes (secondary to severe pancreatitis), family history colon cancer (son), family history of pancreas cancer (brother), idiopathic pancreatitis in 2022 (complicated pancreatic pseudocyst with  walled off necrosis, splenic vein thrombosis-now decompressed and prior necrosectomy but now with recurrent pseudocyst now status post  repeat drainage/aspiration).  The patient is seen today for evaluation and management of:  1. History of pancreatitis   2. Pancreatic cyst   3. Dilation of pancreatic duct   4. Abnormal CT scan, gastrointestinal tract   5. History of stroke    The patient is clinically and hemodynamically stable from a GI perspective at this time.  Unfortunately she did suffer the stroke earlier this year but she seems to be making progress improving with regards to her symptomatology.  Unfortunately as a result of the radiology shortage in [place], I am still waiting the read on the patient's CT scan.  Hopefully will have that later today or tomorrow, I will have my team work on making the urgency of the evaluation known.  I worry that with her chronic splenic vein thrombosis, she may develop gastric or splenic varices in the future which could predispose her to other issues.  Time will tell.  I think her diabetes is still needing further control and she will be discussing that with her doctor coming up soon.  She will continue her Creon for now.  Additional workup or management based on the imaging studies.  All patient questions were answered to the best of my ability, and the patient agrees to the aforementioned plan of action with follow-up as indicated.   PLAN  Follow-up CT scan results in the next 24 to 48 hours patient - My team will call radiology to get read Consider pancreatic ERCP attempt in future if there is evidence of recurrent pseudocyst development We will consider repeat imaging based on results Continue Creon 72,000 units with each meal and 36,000 units with each snack Complete tobacco cessation Cologuard repeat testing in 2026 - This was not the ideal test for her (due to family history) but she did not want to undergo colonoscopy Continue to work with PCP  and endocrinology in regards to diabetes control   No orders of the defined types were placed in this encounter.   New Prescriptions   No medications on file   Modified Medications   No medications on file    Planned Follow Up No follow-ups on file.   Total Time in Face-to-Face and in Coordination of Care for patient including independent/personal interpretation/review of prior testing, medical history, examination, medication adjustment, review of imaging, communicating results with the patient directly, and documentation with the EHR is 25 minutes.   Corliss Parish, MD [facility] Office # [phone]

## 2022-12-06 NOTE — Telephone Encounter (Signed)
Call placed and urgent read requested

## 2022-12-06 NOTE — Patient Instructions (Signed)
Office will contact you with results of CT scan, after we have received report from Radiology. We will contact them today and request follow-up.   _______________________________________________________  If your blood pressure at your visit was 140/90 or greater, please contact your primary care physician to follow up on this.  _______________________________________________________  If you are age 77 or older, your body mass index should be between 23-30. Your Body mass index is 21.31 kg/m. If this is out of the aforementioned range listed, please consider follow up with your Primary Care Provider.  If you are age 26 or younger, your body mass index should be between 19-25. Your Body mass index is 21.31 kg/m. If this is out of the aformentioned range listed, please consider follow up with your Primary Care Provider.   ________________________________________________________  The [facility] providers would like to encourage you to use [facility] to communicate with providers for non-urgent requests or questions.  Due to long hold times on the telephone, sending your provider a message by [facility] may be a faster and more efficient way to get a response.  Please allow 48 business hours for a response.  Please remember that this is for non-urgent requests.  _______________________________________________________  Thank you for choosing me and [facility].  Dr. Meridee Score

## 2022-12-09 ENCOUNTER — Encounter: Payer: Self-pay | Admitting: Gastroenterology

## 2022-12-09 DIAGNOSIS — E1365 Other specified diabetes mellitus with hyperglycemia: Secondary | ICD-10-CM | POA: Diagnosis not present

## 2022-12-09 DIAGNOSIS — F17200 Nicotine dependence, unspecified, uncomplicated: Secondary | ICD-10-CM | POA: Diagnosis not present

## 2022-12-09 DIAGNOSIS — K861 Other chronic pancreatitis: Secondary | ICD-10-CM | POA: Diagnosis not present

## 2022-12-09 DIAGNOSIS — K8681 Exocrine pancreatic insufficiency: Secondary | ICD-10-CM | POA: Diagnosis not present

## 2022-12-09 DIAGNOSIS — I10 Essential (primary) hypertension: Secondary | ICD-10-CM | POA: Diagnosis not present

## 2022-12-09 LAB — HEMOGLOBIN A1C: Hemoglobin A1C: 8.5

## 2022-12-10 DIAGNOSIS — K8689 Other specified diseases of pancreas: Secondary | ICD-10-CM | POA: Insufficient documentation

## 2022-12-10 DIAGNOSIS — Z8673 Personal history of transient ischemic attack (TIA), and cerebral infarction without residual deficits: Secondary | ICD-10-CM | POA: Insufficient documentation

## 2022-12-27 ENCOUNTER — Encounter: Payer: Self-pay | Admitting: Pharmacist

## 2022-12-27 NOTE — Progress Notes (Signed)
Pharmacy Quality Measure Review  This patient is appearing on a report for being at risk of failing the adherence measure for cholesterol (statin) medications this calendar year.   Medication: rosuvastatin 10 Last fill date: 10/01/22 for 90 day supply  Insurance report was not up to date. No action needed at this time.  Medication was refilled as of 12/25/22 for a 90 day supply

## 2022-12-30 ENCOUNTER — Ambulatory Visit (INDEPENDENT_AMBULATORY_CARE_PROVIDER_SITE_OTHER): Payer: Medicare Other | Admitting: Nurse Practitioner

## 2022-12-30 VITALS — BP 132/72 | HR 62 | Temp 97.7°F | Ht 66.0 in | Wt 134.2 lb

## 2022-12-30 DIAGNOSIS — F419 Anxiety disorder, unspecified: Secondary | ICD-10-CM

## 2022-12-30 DIAGNOSIS — E1165 Type 2 diabetes mellitus with hyperglycemia: Secondary | ICD-10-CM | POA: Diagnosis not present

## 2022-12-30 DIAGNOSIS — Z1329 Encounter for screening for other suspected endocrine disorder: Secondary | ICD-10-CM | POA: Diagnosis not present

## 2022-12-30 DIAGNOSIS — I639 Cerebral infarction, unspecified: Secondary | ICD-10-CM

## 2022-12-30 DIAGNOSIS — Z794 Long term (current) use of insulin: Secondary | ICD-10-CM | POA: Diagnosis not present

## 2022-12-30 DIAGNOSIS — Z23 Encounter for immunization: Secondary | ICD-10-CM | POA: Diagnosis not present

## 2022-12-30 DIAGNOSIS — Z8673 Personal history of transient ischemic attack (TIA), and cerebral infarction without residual deficits: Secondary | ICD-10-CM

## 2022-12-30 DIAGNOSIS — I10 Essential (primary) hypertension: Secondary | ICD-10-CM | POA: Diagnosis not present

## 2022-12-30 DIAGNOSIS — E782 Mixed hyperlipidemia: Secondary | ICD-10-CM | POA: Diagnosis not present

## 2022-12-30 DIAGNOSIS — I129 Hypertensive chronic kidney disease with stage 1 through stage 4 chronic kidney disease, or unspecified chronic kidney disease: Secondary | ICD-10-CM

## 2022-12-30 DIAGNOSIS — N1832 Chronic kidney disease, stage 3b: Secondary | ICD-10-CM | POA: Diagnosis not present

## 2022-12-30 LAB — CBC WITH DIFFERENTIAL/PLATELET
Basophils Absolute: 0.1 10*3/uL (ref 0.0–0.1)
Basophils Relative: 1.2 % (ref 0.0–3.0)
Eosinophils Absolute: 0.2 10*3/uL (ref 0.0–0.7)
Eosinophils Relative: 3.2 % (ref 0.0–5.0)
HCT: 43.1 % (ref 36.0–46.0)
Hemoglobin: 14.2 g/dL (ref 12.0–15.0)
Lymphocytes Relative: 29 % (ref 12.0–46.0)
Lymphs Abs: 2.2 10*3/uL (ref 0.7–4.0)
MCHC: 33 g/dL (ref 30.0–36.0)
MCV: 94.2 fL (ref 78.0–100.0)
Monocytes Absolute: 0.6 10*3/uL (ref 0.1–1.0)
Monocytes Relative: 8.4 % (ref 3.0–12.0)
Neutro Abs: 4.5 10*3/uL (ref 1.4–7.7)
Neutrophils Relative %: 58.2 % (ref 43.0–77.0)
Platelets: 194 10*3/uL (ref 150.0–400.0)
RBC: 4.58 Mil/uL (ref 3.87–5.11)
RDW: 13.7 % (ref 11.5–15.5)
WBC: 7.7 10*3/uL (ref 4.0–10.5)

## 2022-12-30 LAB — COMPREHENSIVE METABOLIC PANEL
ALT: 17 U/L (ref 0–35)
AST: 22 U/L (ref 0–37)
Albumin: 4.1 g/dL (ref 3.5–5.2)
Alkaline Phosphatase: 71 U/L (ref 39–117)
BUN: 18 mg/dL (ref 6–23)
CO2: 28 meq/L (ref 19–32)
Calcium: 9.4 mg/dL (ref 8.4–10.5)
Chloride: 104 meq/L (ref 96–112)
Creatinine, Ser: 1.29 mg/dL — ABNORMAL HIGH (ref 0.40–1.20)
GFR: 39.97 mL/min — ABNORMAL LOW (ref >60.00)
Glucose, Bld: 100 mg/dL — ABNORMAL HIGH (ref 70–99)
Potassium: 4.1 meq/L (ref 3.5–5.1)
Sodium: 139 meq/L (ref 135–145)
Total Bilirubin: 0.6 mg/dL (ref 0.2–1.2)
Total Protein: 7 g/dL (ref 6.0–8.3)

## 2022-12-30 LAB — LIPID PANEL
Cholesterol: 160 mg/dL (ref 0–200)
HDL: 44.2 mg/dL (ref >39.00)
LDL Cholesterol: 75 mg/dL (ref 0–99)
NonHDL: 115.88
Total CHOL/HDL Ratio: 4
Triglycerides: 202 mg/dL — ABNORMAL HIGH (ref 0.0–149.0)
VLDL: 40.4 mg/dL — ABNORMAL HIGH (ref 0.0–40.0)

## 2022-12-30 LAB — MICROALBUMIN / CREATININE URINE RATIO
Creatinine,U: 24.7 mg/dL
Microalb Creat Ratio: 6.4 mg/g (ref 0.0–30.0)
Microalb, Ur: 1.6 mg/dL (ref 0.0–1.9)

## 2022-12-30 LAB — TSH: TSH: 1.86 u[IU]/mL (ref 0.35–5.50)

## 2022-12-30 MED ORDER — ALPRAZOLAM 0.25 MG PO TABS: 0.2500 mg | ORAL_TABLET | Freq: Every day | ORAL | 1 refills | Status: AC | PRN

## 2022-12-30 NOTE — Progress Notes (Unsigned)
[name], NP-C Phone: [phone]  Holly Navarro is a 77 y.o. female who presents today for follow up.   ***  Social History   Tobacco Use  Smoking Status Every Day   Current packs/day: 0.25   Average packs/day: 0.3 packs/day for 60.0 years (15.0 ttl pk-yrs)   Types: Cigarettes  Smokeless Tobacco Never  Tobacco Comments   0.25 a pack a day (started age 80)///one or two cig a day 10/13/2022    Current Outpatient Medications on File Prior to Visit  Medication Sig Dispense Refill   amLODipine (NORVASC) 5 MG tablet Take 1 tablet (5 mg total) by mouth daily. 90 tablet 3   aspirin EC 81 MG tablet Take 1 tablet (81 mg total) by mouth daily. 90 tablet 3   atenolol (TENORMIN) 50 MG tablet Take 1 tablet (50 mg total) by mouth daily. 90 tablet 3   blood glucose meter kit and supplies KIT Dispense based on patient and insurance preference. Use up to four times daily as directed. 1 each 0   cimetidine (TAGAMET) 200 MG tablet Take 200 mg by mouth as needed (indigestion/heartburn.).     Coenzyme Q10 (COQ10) 100 MG CAPS Take 100 mg by mouth in the morning.     diphenhydrAMINE (BENADRYL) 25 mg capsule Take 25 mg by mouth every 6 (six) hours as needed for allergies.     ECHINACEA-GOLDENSEAL PO Take 1 capsule by mouth in the morning and at bedtime.     fluticasone (FLONASE) 50 MCG/ACT nasal spray Place 2 sprays into both nostrils daily as needed. 16 g 1   insulin degludec (TRESIBA FLEXTOUCH) 100 UNIT/ML FlexTouch Pen Inject 20 Units into the skin at bedtime. 15 mL 0   insulin lispro (HUMALOG) 100 UNIT/ML KwikPen SMARTSIG:5-10 Unit(s) SUB-Q 3 Times Daily     Insulin Pen Needle (NOVOFINE PEN NEEDLE) 32G X 6 MM MISC Use to inject insulin daily as directed. 90 each 1   lipase/protease/amylase (CREON) 36000 UNITS CPEP capsule Take 2 capsules (72,000 Units total) by mouth 3 (three) times daily with meals. May also take 1 capsule (36,000 Units total) as needed (with snacks). 720 capsule 2   losartan  (COZAAR) 100 MG tablet Take 100 mg by mouth daily.     MILK THISTLE PO Take 1 tablet by mouth in the morning and at bedtime.     Multiple Vitamins-Minerals (MULTIVITAMIN ADULTS 50+) TABS Take 1 tablet by mouth daily in the afternoon.     pantoprazole (PROTONIX) 40 MG tablet Take 1 tablet (40 mg total) by mouth 2 (two) times daily before a meal. 180 tablet 1   rosuvastatin (CRESTOR) 10 MG tablet Take 1 tablet (10 mg total) by mouth daily. 90 tablet 3   simethicone (MYLICON) 80 MG chewable tablet Chew 80 mg by mouth in the morning, at noon, in the evening, and at bedtime.     Simethicone 125 MG CAPS Take 125 mg by mouth in the morning, at noon, in the evening, and at bedtime.     No current facility-administered medications on file prior to visit.     ROS see history of present illness  Objective  Physical Exam Vitals:   12/30/22 1310  BP: 132/72  Pulse: 62  Temp: 97.7 F (36.5 C)  SpO2: 97%    BP Readings from Last 3 Encounters:  12/30/22 132/72  12/06/22 130/80  10/13/22 121/71   Wt Readings from Last 3 Encounters:  12/30/22 134 lb 3.2 oz (60.9 kg)  12/06/22 132  lb (59.9 kg)  10/13/22 128 lb (58.1 kg)    Physical Exam Constitutional:      General: She is not in acute distress.    Appearance: Normal appearance.  HENT:     Head: Normocephalic.  Cardiovascular:     Rate and Rhythm: Normal rate and regular rhythm.     Pulses:          Dorsalis pedis pulses are 2+ on the right side and 2+ on the left side.       Posterior tibial pulses are 2+ on the right side and 2+ on the left side.     Heart sounds: Normal heart sounds.  Pulmonary:     Effort: Pulmonary effort is normal.     Breath sounds: Normal breath sounds.  Musculoskeletal:     Right foot: Normal range of motion. No deformity.     Left foot: Normal range of motion. No deformity.  Feet:     Right foot:     Protective Sensation: 10 sites tested.  10 sites sensed.     Skin integrity: Skin integrity normal. No  ulcer or skin breakdown.     Toenail Condition: Right toenails are normal.     Left foot:     Protective Sensation: 10 sites tested.  10 sites sensed.     Skin integrity: Skin integrity normal. No ulcer or skin breakdown.     Toenail Condition: Left toenails are normal.  Skin:    General: Skin is warm and dry.  Neurological:     General: No focal deficit present.     Mental Status: She is alert.  Psychiatric:        Mood and Affect: Mood normal.        Behavior: Behavior normal.     Assessment/Plan: Please see individual problem list.  Anxiety -     ALPRAZolam; Take 1 tablet (0.25 mg total) by mouth daily as needed for anxiety.  Dispense: 14 tablet; Refill: 1  Type 2 diabetes mellitus with hyperglycemia, with long-term current use of insulin (HCC) -     Microalbumin / creatinine urine ratio  Hypertensive kidney disease with stage 3b chronic kidney disease (HCC) -     Microalbumin / creatinine urine ratio -     Comprehensive metabolic panel  Essential hypertension -     CBC with Differential/Platelet  Need for influenza vaccination -     Flu Vaccine Trivalent High Dose (Fluad)  Mixed dyslipidemia -     Lipid panel  Thyroid disorder screen -     TSH      Return in about 6 months (around 06/30/2023) for Follow up.   [name], NP-C [facility]

## 2023-01-05 ENCOUNTER — Encounter: Payer: Self-pay | Admitting: Nurse Practitioner

## 2023-01-05 DIAGNOSIS — F419 Anxiety disorder, unspecified: Secondary | ICD-10-CM | POA: Insufficient documentation

## 2023-01-05 NOTE — Assessment & Plan Note (Signed)
 Most recent GFR- 37. Encouraged adequate fluid intake. Counseled on importance of glucose and blood pressure control. We will check lab work as outlined.

## 2023-01-05 NOTE — Assessment & Plan Note (Signed)
 They are on Crestor 10mg  daily. Continue. We will check cholesterol levels today.

## 2023-01-05 NOTE — Assessment & Plan Note (Addendum)
 They report increased stress and request a small supply of Alprazolam  for occasional use. They were previously prescribed this medication and restarted it at hospital discharge in September, seven tablets lasting them until now. We will prescribe Alprazolam  0.25mg  daily for occasional use as needed for anxiety. Counseled on common side effects. PDMP reviewed.

## 2023-01-05 NOTE — Assessment & Plan Note (Signed)
 They are on Tresiba  and Humalog, with a recent A1c of 8.5 on 12/09/2022. Using Dexcom for glucose monitoring, they manage insulin  dosing independently, despite some episodes of hypoglycemia. They will continue the current insulin  regimen and follow up with the endocrinologist as scheduled.

## 2023-01-05 NOTE — Assessment & Plan Note (Signed)
 They show continued improvement in left hand function, though one finger remains difficult. Having completed in-home physical and occupational therapy, they are continuing exercises at home with no further outpatient therapy planned at this time. They will continue home exercises. Follow up with Neurology as scheduled.

## 2023-01-05 NOTE — Assessment & Plan Note (Signed)
 Their hypertension is well-controlled on Amlodipine, Atenolol, and Losartan, with no symptoms of chest pain, shortness of breath, or dizziness. They will continue the current antihypertensive regimen.

## 2023-01-13 ENCOUNTER — Encounter: Payer: Self-pay | Admitting: Gastroenterology

## 2023-01-13 ENCOUNTER — Encounter: Payer: Self-pay | Admitting: Neurology

## 2023-01-17 ENCOUNTER — Telehealth: Payer: Self-pay | Admitting: Gastroenterology

## 2023-01-17 NOTE — Telephone Encounter (Signed)
 Inbound call from patient requesting a call to discuss if we had heard from [employer] regarding CREON. Please advise.

## 2023-01-18 ENCOUNTER — Telehealth: Payer: Self-pay

## 2023-01-18 ENCOUNTER — Other Ambulatory Visit (HOSPITAL_COMMUNITY): Payer: Self-pay

## 2023-01-18 NOTE — Telephone Encounter (Signed)
 See My Chart notes- pt sent a message and I did respond no need for phone call

## 2023-01-18 NOTE — Telephone Encounter (Signed)
 Frady A Koskinen to [facility] (supporting Normie Becton., MD)      01/17/23  4:42 PM Could you please call me at your convenience, I do have other questions for you. Thank you [phone].

## 2023-01-25 ENCOUNTER — Other Ambulatory Visit (HOSPITAL_COMMUNITY): Payer: Self-pay

## 2023-01-25 NOTE — Telephone Encounter (Signed)
Left message on machine to call back  

## 2023-01-25 NOTE — Telephone Encounter (Signed)
I do not see any recollection of paperwork from [employer].

## 2023-01-26 NOTE — Telephone Encounter (Signed)
I checked both pods and in Dr Meridee Score office. No paperwork.

## 2023-01-26 NOTE — Telephone Encounter (Signed)
Ro have you seen any paperwork regarding Creon?  The PA team has not seen it and I called the pt and she says it was faxed over twice.  Thank you

## 2023-01-27 NOTE — Telephone Encounter (Signed)
The pt will have the paperwork refaxed to our attention.

## 2023-02-04 ENCOUNTER — Other Ambulatory Visit: Payer: Self-pay | Admitting: Nurse Practitioner

## 2023-02-10 NOTE — Telephone Encounter (Signed)
 Monchell have you received the form from [employer]?

## 2023-02-10 NOTE — Telephone Encounter (Signed)
 Patient called and stated that she needing to talk to the nurse regarding her Creon  medication. Patient is requesting a call back. Please advise.

## 2023-02-10 NOTE — Telephone Encounter (Signed)
 I have communicated with the pt via My Chart- see message dated 2/7.

## 2023-02-21 ENCOUNTER — Other Ambulatory Visit: Payer: Self-pay | Admitting: Gastroenterology

## 2023-02-21 MED ORDER — PANCRELIPASE (LIP-PROT-AMYL) 36000-114000 UNITS PO CPEP: ORAL_CAPSULE | ORAL | 0 refills | Status: DC

## 2023-02-22 IMAGING — CT CT ABDOMEN WO/W CM
2 of 12 series · 9 of 46 positions shown, 15 images · IV contrast (omnipaque)
Comparison: Multiple prior studies most recent April 14, 2020.

CLINICAL DATA: Pancreatic cyst, history of pancreatitis.

EXAM:
CT ABDOMEN WITHOUT AND WITH CONTRAST
TECHNIQUE: Multidetector CT imaging of the abdomen was performed following the
standard protocol before and following the bolus administration of
intravenous contrast.
CONTRAST:  75mL OMNIPAQUE IOHEXOL 300 MG/ML  SOLN

[Series 7: cor arterial arterial phase 2.00 cor · coronal · arterial · 0.41mm/px · 3 of 132 slices shown, 4 images]
[im 33/132  soft-tissue]
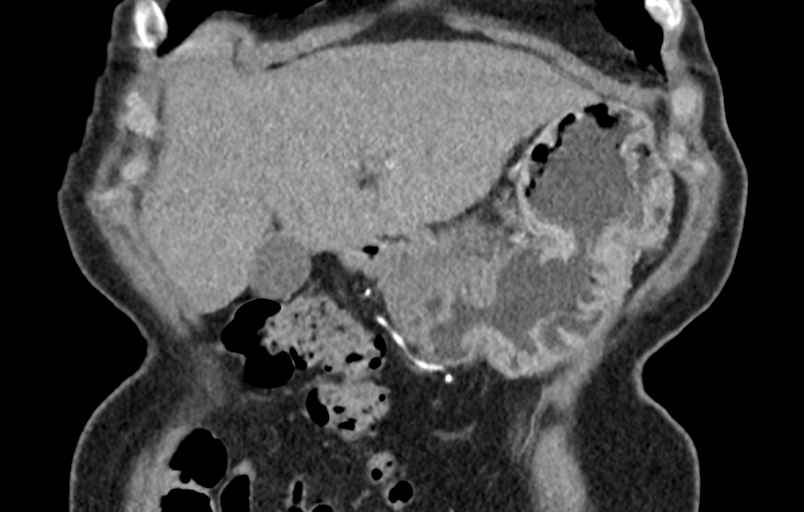
[im 66/132  soft-tissue]
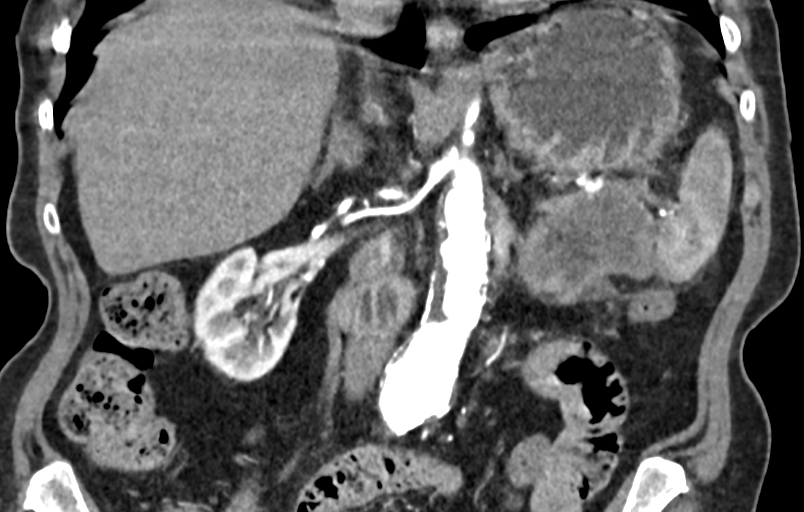
[im 66/132  bone]
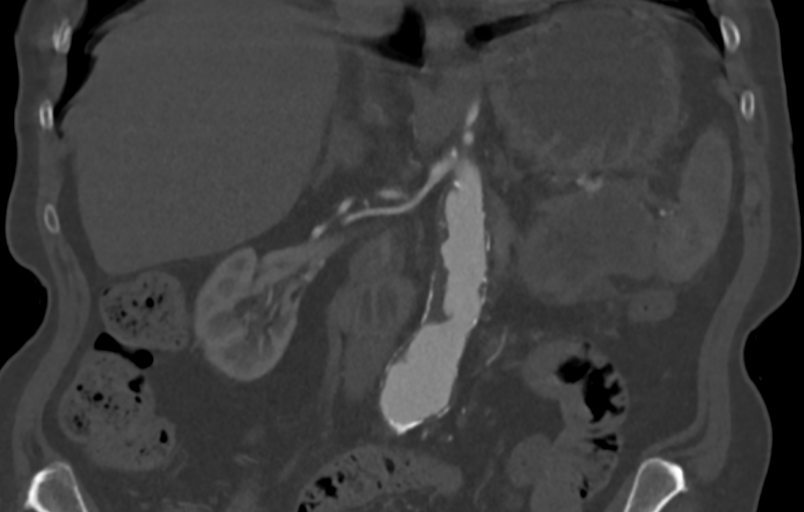
[im 99/132  soft-tissue]
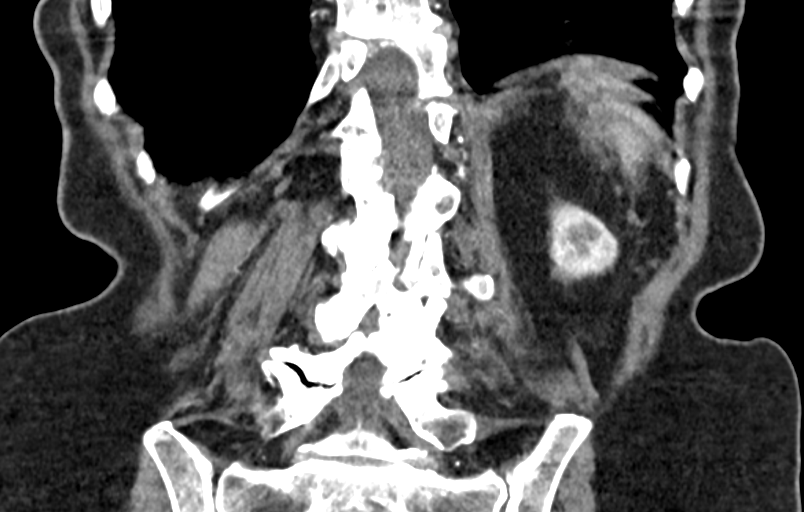

[Series 18: thins axialpancreas venous phase 2.00 · axial · portal-venous · 0.64mm/px · z∈[-1286,-1124]mm · 6 of 115 slices shown, 11 images]
[im 17/115  soft-tissue]
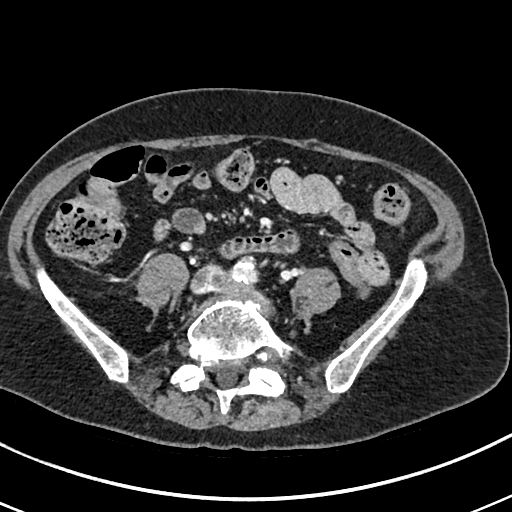
[im 17/115  bone]
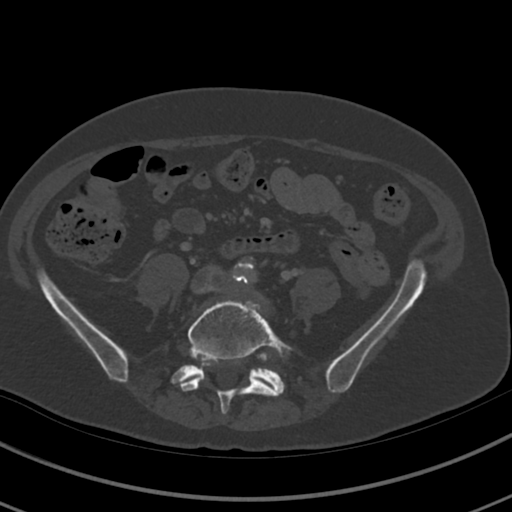
[im 33/115  soft-tissue]
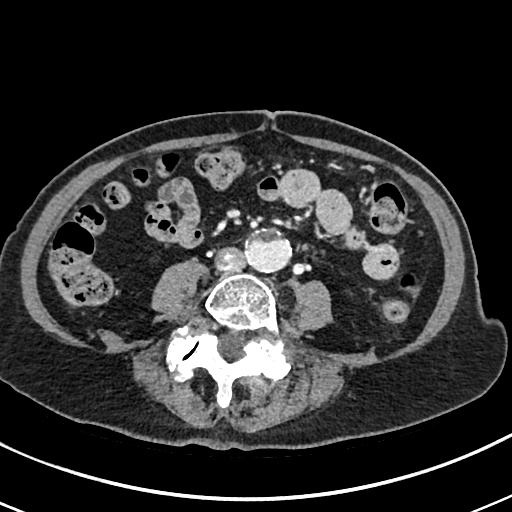
[im 49/115  soft-tissue]
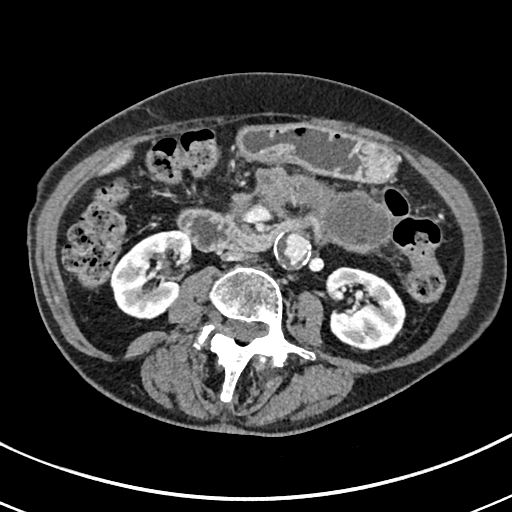
[im 49/115  lung]
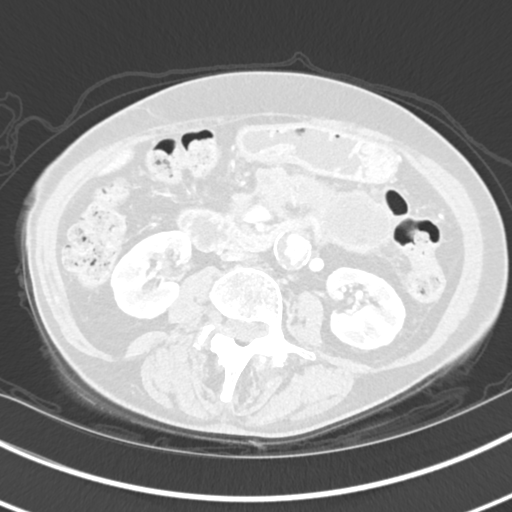
[im 66/115  soft-tissue]
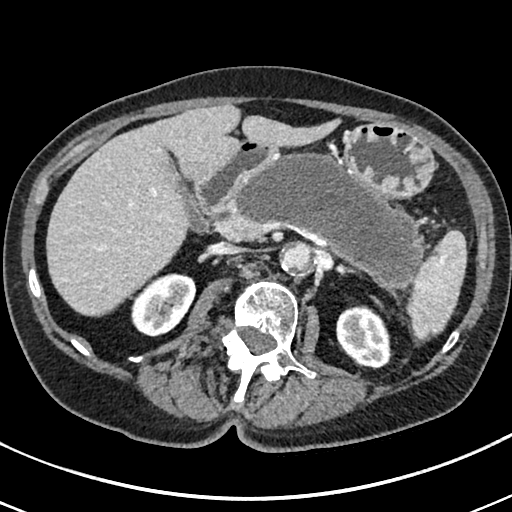
[im 66/115  lung]
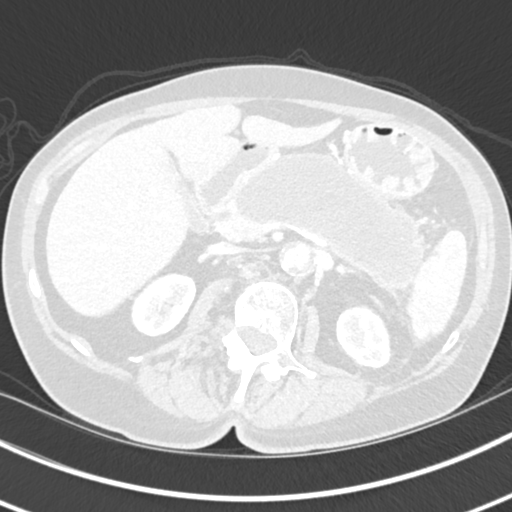
[im 82/115  soft-tissue]
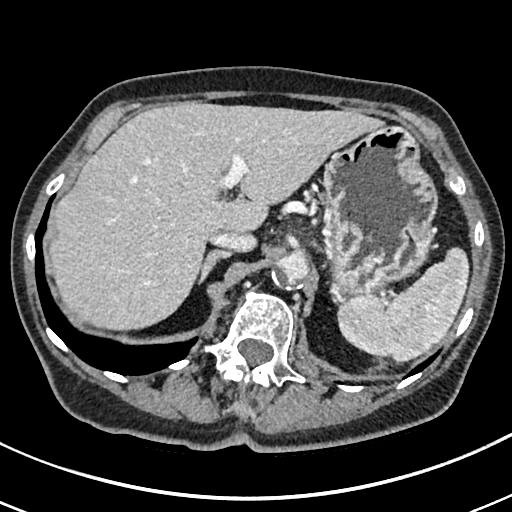
[im 82/115  lung]
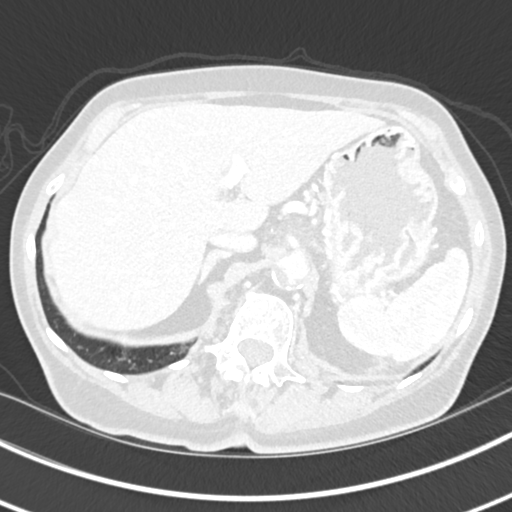
[im 98/115  soft-tissue]
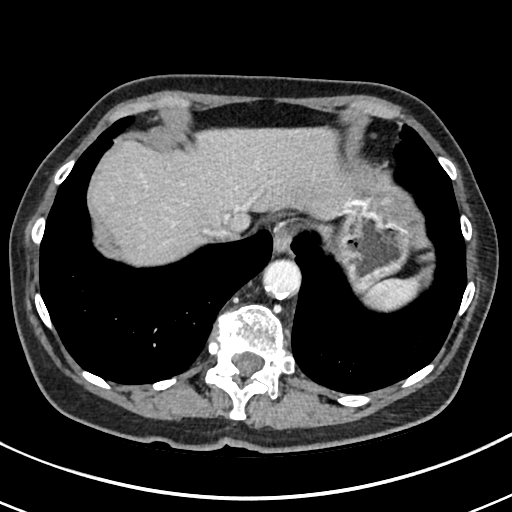
[im 98/115  lung]
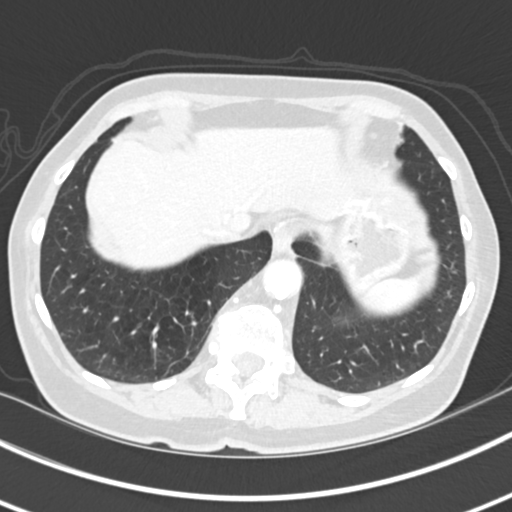

[9 of 46 positions shown; findings below may reference images not displayed]

FINDINGS: Lower chest: Pulmonary emphysema.  No effusion.  No consolidation.

Hepatobiliary: Portal vein is patent into the liver though narrowed
at the splenic-portal confluence. Splenic vein is occluded with
short gastric and LEFT gastric collaterals. No focal, suspicious
hepatic lesion. No pericholecystic stranding. No sign of biliary
duct dilation.

Pancreas: Small amount of normal enhancing pancreatic tissue in the
head of the pancreas.

Area of walled off necrosis in the bed of the pancreas, initial scan
from March 08, 2020 showed acute necrotic changes and peripancreatic
as well as glandular necrosis. This area has contracted and is more
defined currently than on the initial CT appearing similar to the
previous MRI evaluation. Area measuring 12.1 x 5.1 cm. Small
portions of this extend into the transverse mesocolon but this is
very well marginated for the most part and displays little if any
internal enhancement. No sign of current adjacent stranding.

Spleen: Normal size.  Mildly lobulated.  Adjacent splenule.

Adrenals/Urinary Tract: Adrenal glands are normal.

Symmetric renal enhancement with signs of renal cortical scarring.
No hydronephrosis or perinephric stranding.

Stomach/Bowel: No acute gastrointestinal finding to the extent
evaluated on abdominal CT.

Vascular/Lymphatic: 3.1 x 2.8 cm infrarenal abdominal aortic
dilation. More normal proximal abdominal aorta 1.9 cm. Calcified and
noncalcified plaque throughout the abdominal aorta.

Area of walled off necrosis partially encases splenic vasculature
and occludes the splenic vein, narrowing the splenic portal
confluence.

Other: There is no gastrohepatic or hepatoduodenal ligament
lymphadenopathy. No retroperitoneal or mesenteric lymphadenopathy.
No ascites.

Musculoskeletal: No acute musculoskeletal process spinal
degenerative changes.
IMPRESSION: 1. Large area of walled off necrosis at site of previous glandular
and peripancreatic necrosis without signs of gas formation but with
evidence of splenic venous occlusion and portal venous narrowing.
Splenic artery also in close proximity to the above process which is
little changed since [DATE] [DATE], [DATE].
[DATE]. No signs of acute inflammation
3. Developing collateral pathways in the upper abdomen related to
splenic venous occlusion. Attention on follow-up to portal venous
structures is suggested. Appearance is little changed since [DATE]. Pulmonary emphysema.
5. Mild aneurysmal dilation of the infrarenal abdominal aorta
maximal caliber of 3.1 cm. Recommend follow-up every 3 years. This
recommendation follows ACR consensus guidelines: White Paper of the
ACR Incidental Findings Committee II on Vascular Findings. [HOSPITAL] 3197; [DATE].
6. Emphysema and aortic atherosclerosis.

Aortic Atherosclerosis (EJGZ6-045.5) and Emphysema (EJGZ6-V58.P).

## 2023-02-24 NOTE — Telephone Encounter (Signed)
Closing request due to other messaging showing patient is receiving medication

## 2023-03-09 DIAGNOSIS — E782 Mixed hyperlipidemia: Secondary | ICD-10-CM | POA: Diagnosis not present

## 2023-03-09 DIAGNOSIS — K8681 Exocrine pancreatic insufficiency: Secondary | ICD-10-CM | POA: Diagnosis not present

## 2023-03-09 DIAGNOSIS — E1365 Other specified diabetes mellitus with hyperglycemia: Secondary | ICD-10-CM | POA: Diagnosis not present

## 2023-03-09 DIAGNOSIS — K861 Other chronic pancreatitis: Secondary | ICD-10-CM | POA: Diagnosis not present

## 2023-03-09 NOTE — Telephone Encounter (Signed)
 Dr Cathie Beams the pt would like you to know how she is doing.

## 2023-03-10 ENCOUNTER — Other Ambulatory Visit: Payer: Self-pay | Admitting: Nurse Practitioner

## 2023-03-10 DIAGNOSIS — E1165 Type 2 diabetes mellitus with hyperglycemia: Secondary | ICD-10-CM

## 2023-03-10 DIAGNOSIS — Z794 Long term (current) use of insulin: Secondary | ICD-10-CM

## 2023-03-16 ENCOUNTER — Telehealth: Payer: Self-pay | Admitting: Nurse Practitioner

## 2023-03-16 NOTE — Telephone Encounter (Signed)
 MyChart message sent to patient regarding uACR results.

## 2023-03-24 ENCOUNTER — Other Ambulatory Visit: Payer: Self-pay | Admitting: Nurse Practitioner

## 2023-03-24 DIAGNOSIS — E782 Mixed hyperlipidemia: Secondary | ICD-10-CM

## 2023-03-31 DIAGNOSIS — H524 Presbyopia: Secondary | ICD-10-CM | POA: Diagnosis not present

## 2023-03-31 DIAGNOSIS — Z961 Presence of intraocular lens: Secondary | ICD-10-CM | POA: Diagnosis not present

## 2023-03-31 DIAGNOSIS — H47393 Other disorders of optic disc, bilateral: Secondary | ICD-10-CM | POA: Diagnosis not present

## 2023-03-31 DIAGNOSIS — H04123 Dry eye syndrome of bilateral lacrimal glands: Secondary | ICD-10-CM | POA: Diagnosis not present

## 2023-03-31 DIAGNOSIS — H40003 Preglaucoma, unspecified, bilateral: Secondary | ICD-10-CM | POA: Diagnosis not present

## 2023-03-31 LAB — HM DIABETES EYE EXAM

## 2023-04-05 NOTE — Progress Notes (Signed)
 NEUROLOGY FOLLOW UP OFFICE NOTE  SHANYA FERRISS [id number]  Subjective:  Holly Navarro is a 78 y.o. year old right-handed female with a medical history of HTN, HLD, stroke (09/03/22), exocrine pancreatic insufficiency c/b DM, OA, anxiety, current smoker who we last saw on 10/13/22 for right MCA stroke.  To briefly review: 10/13/22: Patient had acute onset slurred speech and left arm weakness while on vacation in [place] on 09/03/22. She woke up and thought her left arm was asleep and weak and slurred speech was noticed. She was taken to hospital and found to have a right MCA embolic stroke. She had no acute intervention. Patient was started on DAPT for 21 days with plans to continue asa monotherapy thereafter. She was in rehab facility from 09/07/22 to 09/21/22 in [place] and returned home on 09/25/22. She is doing PT/OT currently. She did speech therapy previously, but not now.   Patient continues to take both aspirin and plavix. She is on Crestor 10 mg for HLD. She has exocrine pancreatic insufficiency that has caused DM and made control of DM difficult.   She is a current smoker (about 2 cigarettes) per day. She does not drink EtOH or use any illegal drugs.   Currently, patient still has weakness in her hands and some numbness in her hands. She denies any continuing problems with speech.  Most recent Assessment and Plan (10/13/22): Holly Navarro is a 78 y.o. female who presents after recent ischemic stroke in right frontal centrum semiovale (09/03/22). She has a relevant medical history of HTN, HLD, stroke (09/03/22), exocrine pancreatic insufficiency c/b DM, OA, anxiety, current smoker. Her neurological examination is pertinent for left hand weakness. Available diagnostic data is significant for MRI brain showing acute infarct in right frontal centrum semiovale. CTA head and neck showed no significant stenosis or occlusion. The etiology of patient's stroke is most likely small vessel disease due to HTN,  HLD, DM, and smoking.    She has been on DAPT with asa 81 mg and Plavix 75 mg since hospitalization (~1.5 months). Most recent HbA1c was 12.2. Patient completed inpatient rehab and is continuing with outpatient PT/OT.    PLAN: -Continue aspirin 81 mg daily -Stop plavix -Continue Crestor 10 mg daily -Continue PT/OT -Discussed importance of BP, cholesterol, DM control -Discussed smoking cessation -I don't see any contraindication to patient driving  Since their last visit: She feels like her left hand weakness has improved. She is done with therapy but still does the home exercises. She has no new symptoms.  She is on asa 81 mg and crestor 10 mg daily. She is no longer taking plavix.  She is still smoking 2-4 cigarettes per day.   MEDICATIONS:  Outpatient Encounter Medications as of 04/13/2023  Medication Sig Note   ALPRAZolam (XANAX) 0.25 MG tablet Take 1 tablet (0.25 mg total) by mouth daily as needed for anxiety.    amLODipine (NORVASC) 5 MG tablet Take 1 tablet (5 mg total) by mouth daily.    aspirin EC 81 MG tablet Take 1 tablet (81 mg total) by mouth daily.    atenolol (TENORMIN) 50 MG tablet Take 1 tablet (50 mg total) by mouth daily.    blood glucose meter kit and supplies KIT Dispense based on patient and insurance preference. Use up to four times daily as directed.    cimetidine (TAGAMET) 200 MG tablet Take 200 mg by mouth as needed (indigestion/heartburn.).    Coenzyme Q10 (COQ10) 100 MG CAPS Take 100 mg  by mouth in the morning.    Continuous Glucose Sensor (DEXCOM G7 SENSOR) MISC by Does not apply route.    diphenhydrAMINE (BENADRYL) 25 mg capsule Take 25 mg by mouth every 6 (six) hours as needed for allergies.    ECHINACEA-GOLDENSEAL PO Take 1 capsule by mouth in the morning and at bedtime.    fluticasone (FLONASE) 50 MCG/ACT nasal spray Place 2 sprays into both nostrils daily as needed.    insulin lispro (HUMALOG) 100 UNIT/ML KwikPen SMARTSIG:5-10 Unit(s) SUB-Q 3 Times  Daily    Insulin Pen Needle (NOVOFINE PEN NEEDLE) 32G X 6 MM MISC Use to inject insulin daily as directed.    lipase/protease/amylase (CREON) 36000 UNITS CPEP capsule Take 2 capsules by mouth three times with each meal and 1 capsule with each snack daily( up to 2 snacks).    losartan (COZAAR) 100 MG tablet Take 100 mg by mouth daily.    MILK THISTLE PO Take 1 tablet by mouth in the morning and at bedtime.    Multiple Vitamins-Minerals (MULTIVITAMIN ADULTS 50+) TABS Take 1 tablet by mouth daily in the afternoon.    pantoprazole (PROTONIX) 40 MG tablet TAKE 1 TABLET BY MOUTH TWICE DAILY BEFORE A MEAL    rosuvastatin (CRESTOR) 10 MG tablet Take 1 tablet by mouth once daily    simethicone (MYLICON) 80 MG chewable tablet Chew 80 mg by mouth in the morning, at noon, in the evening, and at bedtime. 01/14/2022: Takes 2 different doses of simethicone for 8 doses daily    Simethicone 125 MG CAPS Take 125 mg by mouth in the morning, at noon, in the evening, and at bedtime. 01/14/2022: Takes 2 different doses of simethicone for 8 doses daily   TRESIBA FLEXTOUCH 100 UNIT/ML FlexTouch Pen INJECT 15 UNITS SUBCUTANEOUSLY ONCE DAILY - ADJUST DOSE AS DIRECTED    No facility-administered encounter medications on file as of 04/13/2023.    PAST MEDICAL HISTORY: Past Medical History:  Diagnosis Date   Anemia    Anxiety    Arthritis    Cardiac arrhythmia    Diabetes mellitus without complication (HCC)    Hyperlipidemia    Hypertension    Necrotizing pancreatitis 02/2020   Pancreatic pseudocyst    Seizure (HCC)    pt doesnt remember but was told had one in hospital 2022   Stomach ulcer    Stroke (HCC) 09/03/2022   Wears dentures    full upper, partial lower    PAST SURGICAL HISTORY: Past Surgical History:  Procedure Laterality Date   BIOPSY  05/20/2020   Procedure: BIOPSY;  Surgeon: Lemar Lofty., MD;  Location: [facility];  Service: Gastroenterology;;   BIOPSY  02/25/2021   Procedure:  BIOPSY;  Surgeon: Lemar Lofty., MD;  Location: [facility] ENDOSCOPY;  Service: Gastroenterology;;   BIOPSY  06/24/2021   Procedure: BIOPSY;  Surgeon: Lemar Lofty., MD;  Location: [facility] ENDOSCOPY;  Service: Gastroenterology;;   CATARACT EXTRACTION W/PHACO Right 02/03/2022   Procedure: CATARACT EXTRACTION PHACO AND INTRAOCULAR LENS PLACEMENT (IOC) RIGHT DIABETIC  9.42  01:08.4;  Surgeon: Estanislado Pandy, MD;  Location: [facility];  Service: Ophthalmology;  Laterality: Right;  Diabetic   CATARACT EXTRACTION W/PHACO Left 03/03/2022   Procedure: CATARACT EXTRACTION PHACO AND INTRAOCULAR LENS PLACEMENT (IOC) LEFT DIABETIC  12.57  01:09.8;  Surgeon: Estanislado Pandy, MD;  Location: [facility] SUR[facility];  Service: Ophthalmology;  Laterality: Left;   CYST GASTROSTOMY  06/24/2021   Procedure: NECROSECTOMY;  Surgeon: Lemar Lofty., MD;  Location: [facility] ENDOSCOPY;  Service: Gastroenterology;;   ESOPHAGOGASTRODUODENOSCOPY N/A 05/20/2020   Procedure: ESOPHAGOGASTRODUODENOSCOPY (EGD);  Surgeon: Lemar Lofty., MD;  Location: [facility];  Service: Gastroenterology;  Laterality: N/A;   ESOPHAGOGASTRODUODENOSCOPY N/A 01/18/2022   Procedure: ESOPHAGOGASTRODUODENOSCOPY (EGD);  Surgeon: Lemar Lofty., MD;  Location: [facility];  Service: Gastroenterology;  Laterality: N/A;   ESOPHAGOGASTRODUODENOSCOPY (EGD) WITH PROPOFOL N/A 02/25/2021   Procedure: ESOPHAGOGASTRODUODENOSCOPY (EGD) WITH PROPOFOL;  Surgeon: Meridee Score Netty Starring., MD;  Location: [facility] ENDOSCOPY;  Service: Gastroenterology;  Laterality: N/A;   ESOPHAGOGASTRODUODENOSCOPY (EGD) WITH PROPOFOL N/A 06/24/2021   Procedure: ESOPHAGOGASTRODUODENOSCOPY (EGD) WITH PROPOFOL;  Surgeon: Meridee Score Netty Starring., MD;  Location: [facility] ENDOSCOPY;  Service: Gastroenterology;  Laterality: N/A;   EUS N/A 01/18/2022   Procedure: UPPER ENDOSCOPIC ULTRASOUND (EUS) LINEAR;  Surgeon: Lemar Lofty., MD;  Location: [facility]  ENDOSCOPY;  Service: Gastroenterology;  Laterality: N/A;   FINE NEEDLE ASPIRATION N/A 01/18/2022   Procedure: FINE NEEDLE ASPIRATION (FNA) LINEAR;  Surgeon: Lemar Lofty., MD;  Location: [facility] ENDOSCOPY;  Service: Gastroenterology;  Laterality: N/A;   FOREIGN BODY REMOVAL  06/24/2021   Procedure: FOREIGN BODY REMOVAL;  Surgeon: Meridee Score Netty Starring., MD;  Location: [facility] ENDOSCOPY;  Service: Gastroenterology;;   LAPAROSCOPIC OVARIAN CYSTECTOMY     STENT REMOVAL  06/24/2021   Procedure: STENT REMOVAL;  Surgeon: Lemar Lofty., MD;  Location: [facility] ENDOSCOPY;  Service: Gastroenterology;;   TONSILLECTOMY     UPPER ESOPHAGEAL ENDOSCOPIC ULTRASOUND (EUS) N/A 05/20/2020   Procedure: UPPER ESOPHAGEAL ENDOSCOPIC ULTRASOUND (EUS)  ;  Surgeon: Lemar Lofty., MD;  Location: [facility] ENDOSCOPY;  Service: Gastroenterology;  Laterality: N/A;   UPPER ESOPHAGEAL ENDOSCOPIC ULTRASOUND (EUS) N/A 02/25/2021   Procedure: UPPER ESOPHAGEAL ENDOSCOPIC ULTRASOUND (EUS);  Surgeon: Lemar Lofty., MD;  Location: [facility] ENDOSCOPY;  Service: Gastroenterology;  Laterality: N/A;    ALLERGIES: Allergies  Allergen Reactions   Fentanyl Other (See Comments)    Patient preference, not an allergy    Morphine And Codeine Other (See Comments)    Patient preference, not an allergy      FAMILY HISTORY: Family History  Problem Relation Age of Onset   Heart disease Mother    Kidney disease Mother    Diabetes Father    Pancreatic cancer Brother        Agent Orange   Gallstones Daughter    Colon cancer Son 61   Diabetes Paternal Uncle        x 2   Esophageal cancer Neg Hx    Inflammatory bowel disease Neg Hx    Liver disease Neg Hx    Stomach cancer Neg Hx     SOCIAL HISTORY: Social History   Tobacco Use   Smoking status: Every Day    Current packs/day: 0.25    Average packs/day: 0.3 packs/day for 60.0 years (15.0 ttl pk-yrs)    Types: Cigarettes   Smokeless tobacco: Never   Tobacco comments:     0.25 a pack a day (started age 70)///one or two cig a day 10/13/2022  Vaping Use   Vaping status: Never Used  Substance Use Topics   Alcohol use: Not Currently   Drug use: No   Social History   Social History Narrative   Are you right handed or left handed? right   Are you currently employed ?    What is your current occupation?retired   Do you live at home alone?yes   Who lives with you?    What type of home do you live  in: 1 story or 2 story? one    Caffeine 1/1/2 a day       Objective:  Vital Signs:  BP 130/68   Pulse 60   Ht 5\' 6"  (1.676 m)   Wt 138 lb (62.6 kg)   SpO2 96%   BMI 22.27 kg/m   General: No acute distress.  Patient appears well-groomed.   Head:  Normocephalic/atraumatic Neck: supple Heart: regular rate and rhythm Lungs: Clear to auscultation bilaterally. Vascular: No carotid bruits.  Neurological Exam: Mental status: alert and oriented, speech fluent and not dysarthric, language intact.  Cranial nerves: CN I: not tested CN II: pupils equal, round and reactive to light, visual fields intact CN III, IV, VI:  full range of motion, no nystagmus, no ptosis CN V: facial sensation intact. CN VII: upper and lower face symmetric CN VIII: hearing intact CN IX, X: uvula midline CN XI: sternocleidomastoid and trapezius muscles intact CN XII: tongue midline  Bulk & Tone: normal, no fasciculations. Motor:  muscle strength 5/5 throughout; except in LUE: 4/5 in FDI, APB; 4+ in ADM Deep Tendon Reflexes:  2+ throughout,  toes downgoing.   Sensation:  Pinprick, vibratory, and proprioceptive sensation intact. Finger to nose testing:  Without dysmetria.   Heel to shin:  Without dysmetria.   Gait:  Normal station and stride.  Romberg negative.   Labs and Imaging review: New results: HbA1c (12/09/22): 8.5  12/30/22: CBC w/ diff unremarkable CMP significant for Cr 1.29 TSH wnl Lipid panel: tChol 160, LDL 75, TG 202  Previously reviewed results: Lab  Results  Component Value Date    HGBA1C 12.2 (H) 04/25/2022     Recent Labs       Lab Results  Component Value Date    TSH 3.88 04/25/2022      Recent Labs[] Expand by Default       Lab Results  Component Value Date    ESRSEDRATE 9 07/14/2020      External labs: 09/06/22: CBC unremarkable CMP significant for Cr 1.19     External Imaging: Transthoracic Echo W or WO Definity  Result Date: 09/04/2022 Left Ventricle: Left ventricle size is normal. Wall thickness is normal. Normal segmental wall motion. Normal systolic function. Normal diastolic function.   MRI Brain Wo Contrast  Result Date: 09/04/2022 EXAMINATION: MRI BRAIN WO CONTRAST HISTORY: Transient ischemic attack (TIA) COMPARISON: None TECHNIQUE: Multiplanar multisequence MR imaging of the brain was performed without contrast. FINDINGS: The ventricular system, basal cisterns, and cortical sulcal pattern are within normal limits for the patient's age. There are chronic small vessel ischemic changes. There is an acute/subacute infarct involving the right frontal lobe/centrum semiovale. There is no acute hemorrhage or mass effect. There is no midline shift.The sellar and suprasellar structures are unremarkable. The expected signal void is seen within the major intracranial vessels consistent with their patency. The skull base and craniocervical junction is unremarkable. The visualized orbits, paranasal sinuses, and mastoid complexes shows ethmoid mucosal disease.   IMPRESSION: Acute/subacute infarct involving the right frontal lobe/centrum semiovale. Chronic ischemic changes No acute hemorrhage    CT CTA Code Neuro Head and Neck  Result Date: 09/03/2022 EXAMINATION: CT CTA CODE NEURO HEAD AND NECK HISTORY: Neuro deficit, acute, stroke suspected COMPARISON: None. TECHNIQUE: Thin section axial images of the neck were obtained following the CT angiogram protocol utilizing dose reduction technique. Image post processing and  reconstructions were performed. The images were reconstructed in the sagittal and coronal planes. 3-D imaging was performed and reviewed on  a workstation. FNDINGS: The images demonstrate mild atheromatous changes at the carotid artery bifurcations bilaterally. This extends into the internal carotid arteries bilaterally. There is no evidence for significant stenosis or stenosis greater than 50%. These values were obtained according to the NASCET protocol. Both internal carotid arteries are patent into the intracranial circulation. There is antegrade flow in the vertebral arteries bilaterally. The soft tissues of the neck are normal. There are no soft tissue masses. The larynx and airway are normal. The salivary glands are normal. The lung apices are unremarkable bilaterally. There are severe emphysematous changes within both lungs. Scarring and spiculated nodularities at the right lung base, further evaluation with a nonemergent contrast-enhanced CT of the chest is advised to better assess.   IMPRESSION: 1. No large vessel occlusion, aneurysm, or dissection   CT Code Neuro Brain WO Contrast  Result Date: 09/03/2022 EXAMINATION: CT CODE NEURO BRAIN WO CONT HISTORY: Neuro deficit, acute, stroke suspected COMPARISON: None TECHNIQUE: Axial slices at taken from the base of the skull to the vertex. No intravenous contrast agent is given. Reconstruction images were done. Individualized dose optimization technique was used FINDINGS: There is mild dilatation of the 4th ventricle as well as mild cerebellar atrophy. Supratentorially, the midline structures are normal in position. There is mild dilatation of the lateral as well as the 3rd ventricles with corresponding widening of the cortical sulci representing mild cortical atrophy. There are bilateral basal ganglia calcifications. There are no extra-axial collections, bleed, space-occupying lesion, mass effect or evidence of acute infarction. The visualized orbits are  symmetrical and intact bilaterally. The mastoid air cells are well aerated and are symmetrical bilaterally. The visualized paranasal sinuses are clear. The calvarium is intact.   IMPRESSION: Mild cortical and cerebellar atrophy. Correlation with CT angiogram of the head and neck may be of further value if clinically indicated. Correlation with pre and post-contrast CT and/or MRI study may be of further value if clinically indicated. Correlation with CT perfusion scan may be of further value if clinically indicated. Correlation diffusion-weighted MR examination may be of further value if clinically indicated These results were given to Dr. Nedra Hai resident at the time of dictation on September 03, 2022 12:09 p.m..   Assessment/Plan:  This is Holly Navarro, a 78 y.o. female with right frontal centrum semiovale stroke (09/03/22). The etiology of stroke is likely small vessel disease. Her risk factors include HTN, HLD, secondary DM, and smoking. She completed DAPT with asa and plavix and is now on asa monotherapy. She has mild residual left hand weakness and numbness.   Plan: -Continue aspirin 81 mg daily  -Continue Crestor 10 mg daily -Continue home PT/OT exercises -Again discussed smoking cessation -Discussed stroke warning signs  Return to clinic as needed   Jacquelyne Balint, MD

## 2023-04-13 ENCOUNTER — Ambulatory Visit: Payer: Medicare Other | Admitting: Neurology

## 2023-04-13 ENCOUNTER — Encounter: Payer: Self-pay | Admitting: Neurology

## 2023-04-13 VITALS — BP 130/68 | HR 60 | Ht 66.0 in | Wt 138.0 lb

## 2023-04-13 DIAGNOSIS — R200 Anesthesia of skin: Secondary | ICD-10-CM

## 2023-04-13 DIAGNOSIS — E785 Hyperlipidemia, unspecified: Secondary | ICD-10-CM | POA: Diagnosis not present

## 2023-04-13 DIAGNOSIS — E139 Other specified diabetes mellitus without complications: Secondary | ICD-10-CM | POA: Diagnosis not present

## 2023-04-13 DIAGNOSIS — R29898 Other symptoms and signs involving the musculoskeletal system: Secondary | ICD-10-CM

## 2023-04-13 DIAGNOSIS — I10 Essential (primary) hypertension: Secondary | ICD-10-CM

## 2023-04-13 DIAGNOSIS — I63511 Cerebral infarction due to unspecified occlusion or stenosis of right middle cerebral artery: Secondary | ICD-10-CM

## 2023-04-13 NOTE — Patient Instructions (Signed)
-  Continue aspirin 81 mg daily  -Continue Crestor 10 mg daily -Continue home PT/OT exercises  -Again discussed stopping smoking and that this would be the best thing you could do for your health.  Return to clinic as needed  If you have new difficulty speaking, face droop, numbness on one side of the body, weakness on one side of the body, or dizziness/imbalance, this could be the sign of a stroke. Don't wait, please call EMS and be evaluated at the nearest emergency room.  The physicians and staff at [facility] are committed to providing excellent care. You may receive a survey requesting feedback about your experience at our office. We strive to receive "very good" responses to the survey questions. If you feel that your experience would prevent you from giving the office a "very good " response, please contact our office to try to remedy the situation. We may be reached at [phone]. Thank you for taking the time out of your busy day to complete the survey.  Jacquelyne Balint, MD [facility]

## 2023-04-18 ENCOUNTER — Ambulatory Visit: Payer: Medicare Other | Admitting: *Deleted

## 2023-04-18 VITALS — Ht 66.0 in | Wt 138.4 lb

## 2023-04-18 DIAGNOSIS — Z0000 Encounter for general adult medical examination without abnormal findings: Secondary | ICD-10-CM | POA: Diagnosis not present

## 2023-04-18 NOTE — Patient Instructions (Signed)
 Holly Navarro , Thank you for taking time to come for your Medicare Wellness Visit. I appreciate your ongoing commitment to your health goals. Please review the following plan we discussed and let me know if I can assist you in the future.   Referrals/Orders/Follow-Ups/Clinician Recommendations: Remember to update your tetanus and shingles vaccines at the pharmacy. Consider having a bone density test.  This is a list of the screening recommended for you and due dates:  Health Maintenance  Topic Date Due   DTaP/Tdap/Td vaccine (1 - Tdap) Never done   Pneumonia Vaccine (1 of 2 - PCV) Never done   Zoster (Shingles) Vaccine (1 of 2) Never done   DEXA scan (bone density measurement)  Never done   COVID-19 Vaccine (5 - 2024-25 season) 09/04/2022   Eye exam for diabetics  12/17/2022   Hemoglobin A1C  06/09/2023   Flu Shot  08/04/2023   Yearly kidney function blood test for diabetes  12/30/2023   Yearly kidney health urinalysis for diabetes  12/30/2023   Complete foot exam   12/30/2023   Medicare Annual Wellness Visit  04/17/2024   Hepatitis C Screening  Completed   HPV Vaccine  Aged Out   Meningitis B Vaccine  Aged Out    Advanced directives: (In Chart) A copy of your advanced directives are scanned into your chart should your provider ever need it.  Next Medicare Annual Wellness Visit scheduled for next year: Yes 04/23/24 @ 1:00

## 2023-04-18 NOTE — Progress Notes (Signed)
 Subjective:   Holly Navarro is a 78 y.o. who presents for a Medicare Wellness preventive visit.  Visit Complete: Virtual I connected with  Holly Navarro on 04/18/23 by a audio enabled telemedicine application and verified that I am speaking with the correct person using two identifiers.  Patient Location: Home  Provider Location: Home Office  I discussed the limitations of evaluation and management by telemedicine. The patient expressed understanding and agreed to proceed.  Vital Signs: Because this visit was a virtual/telehealth visit, some criteria may be missing or patient reported. Any vitals not documented were not able to be obtained and vitals that have been documented are patient reported.  VideoDeclined- This patient declined Librarian, academic. Therefore the visit was completed with audio only.  Persons Participating in Visit: Patient.  AWV Questionnaire: Yes: Patient Medicare AWV questionnaire was completed by the patient on 04/17/23; I have confirmed that all information answered by patient is correct and no changes since this date.  Cardiac Risk Factors include: advanced age (>48men, >20 women);diabetes mellitus;hypertension;dyslipidemia     Objective:    Today's Vitals   04/18/23 1306  Weight: 138 lb 6 oz (62.8 kg)  Height: 5\' 6"  (1.676 m)   Body mass index is 22.33 kg/m.     04/18/2023    1:21 PM 04/13/2023    1:46 PM 10/13/2022   11:00 AM 04/15/2022   12:53 PM 03/03/2022   10:57 AM 02/03/2022   12:17 PM 01/18/2022    6:59 AM  Advanced Directives  Does Patient Have a Medical Advance Directive? Yes No No Yes Yes Yes Yes  Type of Estate agent of [place];Living will   Healthcare Power of [place];Living will Healthcare Power of [place];Living will Healthcare Power of [place];Living will   Does patient want to make changes to medical advance directive? No - Patient declined   No - Patient declined No - Patient  declined No - Patient declined   Copy of Healthcare Power of Attorney in Chart? Yes - validated most recent copy scanned in chart (See row information)   Yes - validated most recent copy scanned in chart (See row information) No - copy requested No - copy requested     Current Medications (verified) Outpatient Encounter Medications as of 04/18/2023  Medication Sig   ALPRAZolam (XANAX) 0.25 MG tablet Take 1 tablet (0.25 mg total) by mouth daily as needed for anxiety.   amLODipine (NORVASC) 5 MG tablet Take 1 tablet (5 mg total) by mouth daily.   aspirin EC 81 MG tablet Take 1 tablet (81 mg total) by mouth daily.   atenolol (TENORMIN) 50 MG tablet Take 1 tablet (50 mg total) by mouth daily.   cimetidine (TAGAMET) 200 MG tablet Take 200 mg by mouth as needed (indigestion/heartburn.).   Coenzyme Q10 (COQ10) 100 MG CAPS Take 100 mg by mouth in the morning.   Continuous Glucose Sensor (DEXCOM G7 SENSOR) MISC by Does not apply route.   diphenhydrAMINE (BENADRYL) 25 mg capsule Take 25 mg by mouth every 6 (six) hours as needed for allergies.   ECHINACEA-GOLDENSEAL PO Take 1 capsule by mouth in the morning and at bedtime.   fluticasone (FLONASE) 50 MCG/ACT nasal spray Place 2 sprays into both nostrils daily as needed.   insulin lispro (HUMALOG) 100 UNIT/ML KwikPen SMARTSIG:5-10 Unit(s) SUB-Q 3 Times Daily   Insulin Pen Needle (NOVOFINE PEN NEEDLE) 32G X 6 MM MISC Use to inject insulin daily as directed.   lipase/protease/amylase (CREON)  36000 UNITS CPEP capsule Take 2 capsules by mouth three times with each meal and 1 capsule with each snack daily( up to 2 snacks).   losartan (COZAAR) 100 MG tablet Take 100 mg by mouth daily.   MILK THISTLE PO Take 1 tablet by mouth in the morning and at bedtime.   Multiple Vitamins-Minerals (MULTIVITAMIN ADULTS 50+) TABS Take 1 tablet by mouth daily in the afternoon.   pantoprazole (PROTONIX) 40 MG tablet TAKE 1 TABLET BY MOUTH TWICE DAILY BEFORE A MEAL   rosuvastatin  (CRESTOR) 10 MG tablet Take 1 tablet by mouth once daily   simethicone (MYLICON) 80 MG chewable tablet Chew 80 mg by mouth in the morning, at noon, in the evening, and at bedtime.   Simethicone 125 MG CAPS Take 125 mg by mouth in the morning, at noon, in the evening, and at bedtime.   TRESIBA FLEXTOUCH 100 UNIT/ML FlexTouch Pen INJECT 15 UNITS SUBCUTANEOUSLY ONCE DAILY - ADJUST DOSE AS DIRECTED   blood glucose meter kit and supplies KIT Dispense based on patient and insurance preference. Use up to four times daily as directed. (Patient not taking: Reported on 04/18/2023)   No facility-administered encounter medications on file as of 04/18/2023.    Allergies (verified) Fentanyl and Morphine and codeine   History: Past Medical History:  Diagnosis Date   Anemia    Anxiety    Arthritis    Cardiac arrhythmia    Exocrine pancreatic insufficiency    Hyperlipidemia    Hypertension    Necrotizing pancreatitis 02/2020   Pancreatic pseudocyst    Secondary diabetes mellitus (HCC)    Seizure (HCC)    pt doesnt remember but was told had one in hospital 2022   Stomach ulcer    Stroke (HCC) 09/03/2022   Wears dentures    full upper, partial lower   Past Surgical History:  Procedure Laterality Date   BIOPSY  05/20/2020   Procedure: BIOPSY;  Surgeon: Lemar Lofty., MD;  Location: [facility]COPY;  Service: Gastroenterology;;   BIOPSY  02/25/2021   Procedure: BIOPSY;  Surgeon: Lemar Lofty., MD;  [facility];  Service: Gastroenterology;;   BIOPSY  06/24/2021   Procedure: BIOPSY;  Surgeon: Lemar Lofty., MD;  Location: [facility] ENDOSCOPY;  Service: Gastroenterology;;   CATARACT EXTRACTION W/PHACO Right 02/03/2022   Procedure: CATARACT EXTRACTION PHACO AND INTRAOCULAR LENS PLACEMENT (IOC) RIGHT DIABETIC  9.42  01:08.4;  Surgeon: Estanislado Pandy, MD;  Location: [facility];  Service: Ophthalmology;  Laterality: Right;  Diabetic   CATARACT EXTRACTION W/PHACO  Left 03/03/2022   Procedure: CATARACT EXTRACTION PHACO AND INTRAOCULAR LENS PLACEMENT (IOC) LEFT DIABETIC  12.57  01:09.8;  Surgeon: Estanislado Pandy, MD;  Location: [facility];  Service: Ophthalmology;  Laterality: Left;   CYST GASTROSTOMY  06/24/2021   Procedure: NECROSECTOMY;  Surgeon: Meridee Score Netty Starring., MD;  Location: [facility] ENDOSCOPY;  Service: Gastroenterology;;   ESOPHAGOGASTRODUODENOSCOPY N/A 05/20/2020   Procedure: ESOPHAGOGASTRODUODENOSCOPY (EGD);  Surgeon: Lemar Lofty., MD;  Location: [facility] ENDOS[facility];  Service: Gastroenterology;  Laterality: N/A;   ESOPHAGOGASTRODUODENOSCOPY N/A 01/18/2022   Procedure: ESOPHAGOGASTRODUODENOSCOPY (EGD);  Surgeon: Lemar Lofty., MD;  Location: [facility] ENDOSCOPY;  Service: Gastroenterology;  Laterality: N/A;   ESOPHAGOGASTRODUODENOSCOPY (EGD) WITH PROPOFOL N/A 02/25/2021   Procedure: ESOPHAGOGASTRODUODENOSCOPY (EGD) WITH PROPOFOL;  Surgeon: Meridee Score Netty Starring., MD;  Location: [facility] ENDOSCOPY;  Service: Gastroenterology;  Laterality: N/A;   ESOPHAGOGASTRODUODENOSCOPY (EGD) WITH PROPOFOL N/A 06/24/2021   Procedure: ESOPHAGOGASTRODUODENOSCOPY (EGD) WITH PROPOFOL;  Surgeon: Corliss Parish  Montez Hageman., MD;  Location: [facility] ENDOSCOPY;  Service: Gastroenterology;  Laterality: N/A;   EUS N/A 01/18/2022   Procedure: UPPER ENDOSCOPIC ULTRASOUND (EUS) LINEAR;  Surgeon: Lemar Lofty., MD;  Location: [facility] ENDOSCOPY;  Service: Gastroenterology;  Laterality: N/A;   FINE NEEDLE ASPIRATION N/A 01/18/2022   Procedure: FINE NEEDLE ASPIRATION (FNA) LINEAR;  Surgeon: Lemar Lofty., MD;  Location: [facility] ENDOSCOPY;  Service: Gastroenterology;  Laterality: N/A;   FOREIGN BODY REMOVAL  06/24/2021   Procedure: FOREIGN BODY REMOVAL;  Surgeon: Meridee Score Netty Starring., MD;  Location: [facility] ENDOSCOPY;  Service: Gastroenterology;;   LAPAROSCOPIC OVARIAN CYSTECTOMY     STENT REMOVAL  06/24/2021   Procedure: STENT REMOVAL;  Surgeon: Lemar Lofty., MD;   Location: [facility] ENDOSCOPY;  Service: Gastroenterology;;   TONSILLECTOMY     UPPER ESOPHAGEAL ENDOSCOPIC ULTRASOUND (EUS) N/A 05/20/2020   Procedure: UPPER ESOPHAGEAL ENDOSCOPIC ULTRASOUND (EUS)  ;  Surgeon: Lemar Lofty., MD;  Location: [facility] ENDOSCOPY;  Service: Gastroenterology;  Laterality: N/A;   UPPER ESOPHAGEAL ENDOSCOPIC ULTRASOUND (EUS) N/A 02/25/2021   Procedure: UPPER ESOPHAGEAL ENDOSCOPIC ULTRASOUND (EUS);  Surgeon: Lemar Lofty., MD;  Location[facility] ENDOSCOPY;  Service: Gastroenterology;  Laterality: N/A;   Family History  Problem Relation Age of Onset   Heart disease Mother    Kidney disease Mother    Diabetes Father    Pancreatic cancer Brother        Agent Orange   Gallstones Daughter    Colon cancer Son 38   Diabetes Paternal Uncle        x 2   Esophageal cancer Neg Hx    Inflammatory bowel disease Neg Hx    Liver disease Neg Hx    Stomach cancer Neg Hx    Social History   Socioeconomic History   Marital status: Widowed    Spouse name: Not on file   Number of children: 4   Years of education: Not on file   Highest education level: Some college, no degree  Occupational History   Occupation: retired  Tobacco Use   Smoking status: Every Day    Current packs/day: 0.25    Average packs/day: 0.3 packs/day for 60.0 years (15.0 ttl pk-yrs)    Types: Cigarettes   Smokeless tobacco: Never   Tobacco comments:    0.25 a pack a day (started age 34)///one or two cig a day 10/13/2022  Vaping Use   Vaping status: Never Used  Substance and Sexual Activity   Alcohol use: Not Currently   Drug use: No   Sexual activity: Not on file  Other Topics Concern   Not on file  Social History Narrative   Are you right handed or left handed? right   Are you currently employed ?    What is your current occupation?retired   Do you live at home alone?yes   Who lives with you?    What type of home do you live in: 1 story or 2 story? one    Caffeine 1/1/2 a day     Social Drivers of Health   Financial Resource Strain: Low Risk  (04/18/2023)   Overall Financial Resource Strain (CARDIA)    Difficulty of Paying Living Expenses: Not hard at all  Food Insecurity: No Food Insecurity (04/18/2023)   Hunger Vital Sign    Worried About Running Out of Food in the Last Year: Never true    Ran Out of Food in the Last Year: Never true  Transportation Needs: No Transportation Needs (04/18/2023)  PRAPARE - Administrator, Civil Service (Medical): No    Lack of Transportation (Non-Medical): No  Physical Activity: Insufficiently Active (04/18/2023)   Exercise Vital Sign    Days of Exercise per Week: 7 days    Minutes of Exercise per Session: 10 min  Stress: No Stress Concern Present (04/18/2023)   [employer] of Occupational Health - Occupational Stress Questionnaire    Feeling of Stress : Not at all  Social Connections: Moderately Integrated (04/18/2023)   Social Connection and Isolation Panel [NHANES]    Frequency of Communication with Friends and Family: More than three times a week    Frequency of Social Gatherings with Friends and Family: More than three times a week    Attends Religious Services: More than 4 times per year    Active Member of [employer] or Organizations: Yes    Attends Banker Meetings: More than 4 times per year    Marital Status: Widowed    Tobacco Counseling Ready to quit: Not Answered Counseling given: Not Answered Tobacco comments: 0.25 a pack a day (started age 56)///one or two cig a day 10/13/2022    Clinical Intake:  Pre-visit preparation completed: Yes  Pain : No/denies pain     BMI - recorded: 22.33 Nutritional Status: BMI of 19-24  Normal Nutritional Risks: None Diabetes: Yes CBG done?: Yes (FBS 170 per patient) Did pt. bring in CBG monitor from home?: No  Lab Results  Component Value Date   HGBA1C 8.5 12/09/2022   HGBA1C 10.5 09/04/2022   HGBA1C 12.2 (H) 04/25/2022     How often  do you need to have someone help you when you read instructions, pamphlets, or other written materials from your doctor or pharmacy?: 1 - Never  Interpreter Needed?: No  Information entered by :: R. Nithila Sumners LPN   Activities of Daily Living     04/17/2023    4:32 PM  In your present state of health, do you have any difficulty performing the following activities:  Hearing? 0  Vision? 0  Difficulty concentrating or making decisions? 0  Walking or climbing stairs? 0  Dressing or bathing? 0  Doing errands, shopping? 1  Preparing Food and eating ? N  Using the Toilet? N  In the past six months, have you accidently leaked urine? N  Do you have problems with loss of bowel control? N  Managing your Medications? N  Managing your Finances? N  Housekeeping or managing your Housekeeping? N    Patient Care Team: Bluford Burkitt, NP as PCP - General (Nurse Practitioner)  Indicate any recent Medical Services you may have received from other than [facility] providers in the past year (date may be approximate).     Assessment:   This is a routine wellness examination for Holly Navarro.  Hearing/Vision screen Hearing Screening - Comments:: No issues Vision Screening - Comments:: glasses   Goals Addressed             This Visit's Progress    Patient Stated       Wants to stay active and visit family more       Depression Screen     04/18/2023    1:17 PM 04/15/2022   12:53 PM 01/28/2022   10:06 AM 10/26/2021    2:20 PM 06/10/2021    1:40 PM 04/22/2021    3:50 PM 12/11/2020    2:25 PM  PHQ 2/9 Scores  PHQ - 2 Score 0 0 0 1 0 0  0  PHQ- 9 Score 1  1 2  1      Fall Risk     04/17/2023    4:32 PM 04/13/2023    1:46 PM 04/12/2022    7:15 PM 01/28/2022   10:05 AM 10/26/2021    2:20 PM  Fall Risk   Falls in the past year? 0 0 0 0 0  Number falls in past yr: 0 0 0 0 0  Injury with Fall? 0 0 0 0 0  Risk for fall due to : No Fall Risks   No Fall Risks No Fall Risks  Follow up Falls prevention  discussed;Falls evaluation completed Falls evaluation completed Falls evaluation completed;Falls prevention discussed Falls evaluation completed Falls evaluation completed    MEDICARE RISK AT HOME:  Medicare Risk at Home Any stairs in or around the home?: (Patient-Rptd) Yes If so, are there any without handrails?: (Patient-Rptd) No Home free of loose throw rugs in walkways, pet beds, electrical cords, etc?: (Patient-Rptd) Yes Adequate lighting in your home to reduce risk of falls?: (Patient-Rptd) Yes Life alert?: (Patient-Rptd) No Use of a cane, walker or w/c?: (Patient-Rptd) No Grab bars in the bathroom?: (Patient-Rptd) Yes Shower chair or bench in shower?: (Patient-Rptd) Yes Elevated toilet seat or a handicapped toilet?: (Patient-Rptd) No  TIMED UP AND GO:  Was the test performed?  No  Cognitive Function: 6CIT completed        04/18/2023    1:21 PM 04/15/2022   12:59 PM  6CIT Screen  What Year? 0 points 0 points  What month? 0 points 0 points  What time? 0 points 0 points  Count back from 20 0 points 0 points  Months in reverse 0 points 0 points  Repeat phrase 0 points 0 points  Total Score 0 points 0 points    Immunizations Immunization History  Administered Date(s) Administered   Fluad Quad(high Dose 65+) 09/17/2018, 12/11/2020, 10/26/2021   Fluad Trivalent(High Dose 65+) 12/30/2022   Influenza, High Dose Seasonal PF 11/03/2014, 11/23/2016, 12/04/2017   Influenza-Unspecified 11/20/2019   PFIZER Comirnaty(Gray Top)Covid-19 Tri-Sucrose Vaccine 08/01/2020   PFIZER(Purple Top)SARS-COV-2 Vaccination 02/20/2019, 03/13/2019, 12/10/2019    Screening Tests Health Maintenance  Topic Date Due   DTaP/Tdap/Td (1 - Tdap) Never done   Pneumonia Vaccine 90+ Years old (1 of 2 - PCV) Never done   Zoster Vaccines- Shingrix (1 of 2) Never done   DEXA SCAN  Never done   COVID-19 Vaccine (5 - 2024-25 season) 09/04/2022   OPHTHALMOLOGY EXAM  12/17/2022   HEMOGLOBIN A1C  06/09/2023    INFLUENZA VACCINE  08/04/2023   Diabetic kidney evaluation - eGFR measurement  12/30/2023   Diabetic kidney evaluation - Urine ACR  12/30/2023   FOOT EXAM  12/30/2023   Medicare Annual Wellness (AWV)  04/17/2024   Hepatitis C Screening  Completed   HPV VACCINES  Aged Out   Meningococcal B Vaccine  Aged Out    Health Maintenance  Health Maintenance Due  Topic Date Due   DTaP/Tdap/Td (1 - Tdap) Never done   Pneumonia Vaccine 44+ Years old (1 of 2 - PCV) Never done   Zoster Vaccines- Shingrix (1 of 2) Never done   DEXA SCAN  Never done   COVID-19 Vaccine (5 - 2024-25 season) 09/04/2022   OPHTHALMOLOGY EXAM  12/17/2022   Health Maintenance Items Addressed: Patient declines bone density and covid vaccine. Patient stated that she is unable to take pneumonia vaccines because of a bad reaction previously.  Discussed the need  to update her tetanus and shingles vaccines.  Additional Screening:  Vision Screening: Recommended annual ophthalmology exams for early detection of glaucoma and other disorders of the eye. Up to date. Will request office notes from [facility]  Dental Screening: Recommended annual dental exams for proper oral hygiene  Community Resource Referral / Chronic Care Management: CRR required this visit?  No   CCM required this visit?  No     Plan:     I have personally reviewed and noted the following in the patient's chart:   Medical and social history Use of alcohol, tobacco or illicit drugs  Current medications and supplements including opioid prescriptions. Patient is not currently taking opioid prescriptions. Functional ability and status Nutritional status Physical activity Advanced directives List of other physicians Hospitalizations, surgeries, and ER visits in previous 12 months Vitals Screenings to include cognitive, depression, and falls Referrals and appointments  In addition, I have reviewed and discussed with patient certain preventive  protocols, quality metrics, and best practice recommendations. A written personalized care plan for preventive services as well as general preventive health recommendations were provided to patient.     Felicitas Horse, LPN   [id number]   After Visit Summary: (MyChart) Due to this being a telephonic visit, the after visit summary with patients personalized plan was offered to patient via MyChart   Notes: Nothing significant to report at this time.

## 2023-05-04 DIAGNOSIS — H40003 Preglaucoma, unspecified, bilateral: Secondary | ICD-10-CM | POA: Diagnosis not present

## 2023-05-12 ENCOUNTER — Other Ambulatory Visit: Payer: Self-pay | Admitting: Nurse Practitioner

## 2023-05-12 DIAGNOSIS — H40003 Preglaucoma, unspecified, bilateral: Secondary | ICD-10-CM | POA: Diagnosis not present

## 2023-05-12 DIAGNOSIS — Z0000 Encounter for general adult medical examination without abnormal findings: Secondary | ICD-10-CM

## 2023-05-18 ENCOUNTER — Encounter: Payer: Self-pay | Admitting: Gastroenterology

## 2023-05-18 DIAGNOSIS — K259 Gastric ulcer, unspecified as acute or chronic, without hemorrhage or perforation: Secondary | ICD-10-CM

## 2023-05-18 DIAGNOSIS — K862 Cyst of pancreas: Secondary | ICD-10-CM

## 2023-05-18 DIAGNOSIS — Z8719 Personal history of other diseases of the digestive system: Secondary | ICD-10-CM

## 2023-05-20 NOTE — Telephone Encounter (Signed)
 Have patient come in for CBC/CMP/Amylase/Lipase labs. She can get updated CTAP with IV/PO contrast. Dx history of pancreatitis, history of gastric ulcer Thanks. GM

## 2023-05-22 NOTE — Addendum Note (Signed)
 Addended by: Aneita Keens on: 05/22/2023 09:21 AM   Modules accepted: Orders

## 2023-05-26 ENCOUNTER — Ambulatory Visit: Payer: Self-pay | Admitting: Gastroenterology

## 2023-05-26 ENCOUNTER — Other Ambulatory Visit (INDEPENDENT_AMBULATORY_CARE_PROVIDER_SITE_OTHER)

## 2023-05-26 DIAGNOSIS — K259 Gastric ulcer, unspecified as acute or chronic, without hemorrhage or perforation: Secondary | ICD-10-CM | POA: Diagnosis not present

## 2023-05-26 DIAGNOSIS — K862 Cyst of pancreas: Secondary | ICD-10-CM

## 2023-05-26 DIAGNOSIS — Z8719 Personal history of other diseases of the digestive system: Secondary | ICD-10-CM

## 2023-05-26 LAB — CBC WITH DIFFERENTIAL/PLATELET
Basophils Absolute: 0.1 10*3/uL (ref 0.0–0.1)
Basophils Relative: 1.8 % (ref 0.0–3.0)
Eosinophils Absolute: 0.2 10*3/uL (ref 0.0–0.7)
Eosinophils Relative: 3.5 % (ref 0.0–5.0)
HCT: 44.3 % (ref 36.0–46.0)
Hemoglobin: 14.9 g/dL (ref 12.0–15.0)
Lymphocytes Relative: 32 % (ref 12.0–46.0)
Lymphs Abs: 2.1 10*3/uL (ref 0.7–4.0)
MCHC: 33.7 g/dL (ref 30.0–36.0)
MCV: 90.9 fl (ref 78.0–100.0)
Monocytes Absolute: 0.5 10*3/uL (ref 0.1–1.0)
Monocytes Relative: 8.4 % (ref 3.0–12.0)
Neutro Abs: 3.5 10*3/uL (ref 1.4–7.7)
Neutrophils Relative %: 54.3 % (ref 43.0–77.0)
Platelets: 224 10*3/uL (ref 150.0–400.0)
RBC: 4.87 Mil/uL (ref 3.87–5.11)
RDW: 13.5 % (ref 11.5–15.5)
WBC: 6.5 10*3/uL (ref 4.0–10.5)

## 2023-05-26 LAB — COMPREHENSIVE METABOLIC PANEL WITH GFR
ALT: 11 U/L (ref 0–35)
AST: 15 U/L (ref 0–37)
Albumin: 4.2 g/dL (ref 3.5–5.2)
Alkaline Phosphatase: 73 U/L (ref 39–117)
BUN: 18 mg/dL (ref 6–23)
CO2: 27 meq/L (ref 19–32)
Calcium: 9.7 mg/dL (ref 8.4–10.5)
Chloride: 100 meq/L (ref 96–112)
Creatinine, Ser: 1.42 mg/dL — ABNORMAL HIGH (ref 0.40–1.20)
GFR: 35.52 mL/min — ABNORMAL LOW (ref >60.00)
Glucose, Bld: 244 mg/dL — ABNORMAL HIGH (ref 70–99)
Potassium: 4.7 meq/L (ref 3.5–5.1)
Sodium: 134 meq/L — ABNORMAL LOW (ref 135–145)
Total Bilirubin: 0.5 mg/dL (ref 0.2–1.2)
Total Protein: 7.4 g/dL (ref 6.0–8.3)

## 2023-05-26 LAB — LIPASE: Lipase: 12 U/L (ref 11.0–59.0)

## 2023-05-26 LAB — AMYLASE: Amylase: 53 U/L (ref 27–131)

## 2023-06-01 ENCOUNTER — Ambulatory Visit (HOSPITAL_COMMUNITY)
Admission: RE | Admit: 2023-06-01 | Discharge: 2023-06-01 | Disposition: A | Discharge status: Home / Self Care | Source: Ambulatory Visit | Attending: Gastroenterology | Admitting: Gastroenterology

## 2023-06-01 DIAGNOSIS — Z8719 Personal history of other diseases of the digestive system: Secondary | ICD-10-CM | POA: Diagnosis not present

## 2023-06-01 DIAGNOSIS — K862 Cyst of pancreas: Secondary | ICD-10-CM | POA: Diagnosis not present

## 2023-06-01 DIAGNOSIS — K259 Gastric ulcer, unspecified as acute or chronic, without hemorrhage or perforation: Secondary | ICD-10-CM | POA: Insufficient documentation

## 2023-06-01 DIAGNOSIS — I7143 Infrarenal abdominal aortic aneurysm, without rupture: Secondary | ICD-10-CM | POA: Diagnosis not present

## 2023-06-01 DIAGNOSIS — K8689 Other specified diseases of pancreas: Secondary | ICD-10-CM | POA: Diagnosis not present

## 2023-06-01 MED ORDER — IOHEXOL 300 MG/ML  SOLN
75.0000 mL | Freq: Once | INTRAMUSCULAR | Status: AC | PRN
  Administered 2023-06-01: 75 mL via INTRAVENOUS

## 2023-06-01 MED ORDER — SODIUM CHLORIDE (PF) 0.9 % IJ SOLN
INTRAMUSCULAR | Status: AC
  Filled 2023-06-01: qty 50

## 2023-06-07 DIAGNOSIS — N183 Chronic kidney disease, stage 3 unspecified: Secondary | ICD-10-CM | POA: Diagnosis not present

## 2023-06-07 DIAGNOSIS — I10 Essential (primary) hypertension: Secondary | ICD-10-CM | POA: Diagnosis not present

## 2023-06-07 DIAGNOSIS — E1365 Other specified diabetes mellitus with hyperglycemia: Secondary | ICD-10-CM | POA: Diagnosis not present

## 2023-06-07 DIAGNOSIS — F17200 Nicotine dependence, unspecified, uncomplicated: Secondary | ICD-10-CM | POA: Diagnosis not present

## 2023-06-07 DIAGNOSIS — K861 Other chronic pancreatitis: Secondary | ICD-10-CM | POA: Diagnosis not present

## 2023-06-07 DIAGNOSIS — K862 Cyst of pancreas: Secondary | ICD-10-CM | POA: Diagnosis not present

## 2023-06-07 DIAGNOSIS — E782 Mixed hyperlipidemia: Secondary | ICD-10-CM | POA: Diagnosis not present

## 2023-06-11 ENCOUNTER — Other Ambulatory Visit: Payer: Self-pay | Admitting: Nurse Practitioner

## 2023-06-11 DIAGNOSIS — E782 Mixed hyperlipidemia: Secondary | ICD-10-CM

## 2023-06-11 DIAGNOSIS — E1165 Type 2 diabetes mellitus with hyperglycemia: Secondary | ICD-10-CM

## 2023-06-14 ENCOUNTER — Other Ambulatory Visit: Payer: Self-pay | Admitting: Nurse Practitioner

## 2023-06-14 ENCOUNTER — Encounter: Payer: Self-pay | Admitting: Gastroenterology

## 2023-06-14 DIAGNOSIS — E1165 Type 2 diabetes mellitus with hyperglycemia: Secondary | ICD-10-CM

## 2023-06-19 ENCOUNTER — Ambulatory Visit: Admitting: Nurse Practitioner

## 2023-06-22 ENCOUNTER — Encounter: Payer: Self-pay | Admitting: Nurse Practitioner

## 2023-06-22 ENCOUNTER — Ambulatory Visit (INDEPENDENT_AMBULATORY_CARE_PROVIDER_SITE_OTHER): Admitting: Nurse Practitioner

## 2023-06-22 VITALS — BP 126/74 | HR 62 | Temp 98.1°F | Resp 20 | Ht 66.0 in | Wt 138.5 lb

## 2023-06-22 DIAGNOSIS — I10 Essential (primary) hypertension: Secondary | ICD-10-CM | POA: Diagnosis not present

## 2023-06-22 DIAGNOSIS — I714 Abdominal aortic aneurysm, without rupture, unspecified: Secondary | ICD-10-CM | POA: Diagnosis not present

## 2023-06-22 DIAGNOSIS — Z794 Long term (current) use of insulin: Secondary | ICD-10-CM

## 2023-06-22 DIAGNOSIS — Z8673 Personal history of transient ischemic attack (TIA), and cerebral infarction without residual deficits: Secondary | ICD-10-CM | POA: Diagnosis not present

## 2023-06-22 DIAGNOSIS — K861 Other chronic pancreatitis: Secondary | ICD-10-CM

## 2023-06-22 DIAGNOSIS — E1165 Type 2 diabetes mellitus with hyperglycemia: Secondary | ICD-10-CM | POA: Diagnosis not present

## 2023-06-22 DIAGNOSIS — N1832 Chronic kidney disease, stage 3b: Secondary | ICD-10-CM | POA: Diagnosis not present

## 2023-06-22 DIAGNOSIS — Z780 Asymptomatic menopausal state: Secondary | ICD-10-CM | POA: Diagnosis not present

## 2023-06-22 DIAGNOSIS — I129 Hypertensive chronic kidney disease with stage 1 through stage 4 chronic kidney disease, or unspecified chronic kidney disease: Secondary | ICD-10-CM

## 2023-06-22 NOTE — Progress Notes (Unsigned)
 Leron Glance, NP-C Phone: [phone]  Holly Navarro is a 78 y.o. female who presents today for follow up.   Discussed the use of AI scribe software for clinical note transcription with the patient, who gave verbal consent to proceed.  History of Present Illness   Holly Navarro is a 78 year old female with diabetes and hypertension who presents for a routine follow-up.  She manages her diabetes with Tresiba  and Humalog, resulting in improved glycemic control, as evidenced by a decrease in her A1c from 8.5 to 7.6. She uses a Dexcom device for continuous glucose monitoring and is mindful of her sugar intake, particularly when consuming Gatorade to address slightly low electrolytes. Her diet includes fruits and vegetables rich in electrolytes.  Her blood pressure remains stable, with recent readings around 123/64 mmHg. She monitors her blood pressure once or twice a week, especially if she feels something might be affecting it. Her current medications include amlodipine , atenolol , and losartan . Atenolol  was initially prescribed for heart palpitations, and she takes a 50 mg tablet daily.  She has a history of organ failure from a hospitalization three and a half years ago, which she believes may have impacted her kidney recovery. Her kidney function has shown slight improvement according to her endocrinologist.  She is aware of an aortic aneurysm that requires monitoring every three years. A recent scan showed no cysts or blockages in the ducts, and her pancreas is noted to be absent.  She experiences residual effects from a stroke, specifically difficulty using her index finger, which tends to go under her other fingers. She attributes the stroke to stress caused by a family member.  She plans to travel to the [place] until September and has decided not to allow her grandson to accompany her, as she believes he contributed to her previous stroke. She spends a lot of time in the sun and consumes  a diet rich in fruits and vegetables. No chest pain, shortness of breath, dizziness, or swelling.      Social History   Tobacco Use  Smoking Status Every Day   Current packs/day: 0.25   Average packs/day: 0.3 packs/day for 60.0 years (15.0 ttl pk-yrs)   Types: Cigarettes  Smokeless Tobacco Never  Tobacco Comments   0.25 a pack a day (started age 39)///one or two cig a day 10/13/2022    Current Outpatient Medications on File Prior to Visit  Medication Sig Dispense Refill   ALPRAZolam  (XANAX ) 0.25 MG tablet Take 1 tablet (0.25 mg total) by mouth daily as needed for anxiety. 14 tablet 1   amLODipine  (NORVASC ) 5 MG tablet Take 1 tablet (5 mg total) by mouth daily. 90 tablet 3   aspirin  EC 81 MG tablet Take 1 tablet (81 mg total) by mouth daily. 90 tablet 3   atenolol  (TENORMIN ) 50 MG tablet Take 1 tablet by mouth once daily 90 tablet 3   cimetidine (TAGAMET) 200 MG tablet Take 200 mg by mouth as needed (indigestion/heartburn.).     Coenzyme Q10 (COQ10) 100 MG CAPS Take 100 mg by mouth in the morning.     Continuous Glucose Sensor (DEXCOM G7 SENSOR) MISC by Does not apply route.     diphenhydrAMINE  (BENADRYL ) 25 mg capsule Take 25 mg by mouth every 6 (six) hours as needed for allergies.     ECHINACEA-GOLDENSEAL PO Take 1 capsule by mouth in the morning and at bedtime.     fluticasone  (FLONASE ) 50 MCG/ACT nasal spray Place 2 sprays into both  nostrils daily as needed. 16 g 1   insulin  lispro (HUMALOG) 100 UNIT/ML KwikPen SMARTSIG:5-10 Unit(s) SUB-Q 3 Times Daily     Insulin  Pen Needle (NOVOFINE PEN NEEDLE) 32G X 6 MM MISC Use to inject insulin  daily as directed. 90 each 1   lipase/protease/amylase (CREON ) 36000 UNITS CPEP capsule Take 2 capsules by mouth three times with each meal and 1 capsule with each snack daily( up to 2 snacks). 100 capsule 0   losartan  (COZAAR ) 100 MG tablet Take 100 mg by mouth daily.     MILK THISTLE PO Take 1 tablet by mouth in the morning and at bedtime.      Multiple Vitamins-Minerals (MULTIVITAMIN ADULTS 50+) TABS Take 1 tablet by mouth daily in the afternoon.     pantoprazole  (PROTONIX ) 40 MG tablet TAKE 1 TABLET BY MOUTH TWICE DAILY BEFORE A MEAL 180 tablet 3   rosuvastatin  (CRESTOR ) 10 MG tablet Take 1 tablet by mouth once daily 90 tablet 3   simethicone  (MYLICON) 80 MG chewable tablet Chew 80 mg by mouth in the morning, at noon, in the evening, and at bedtime.     Simethicone  125 MG CAPS Take 125 mg by mouth in the morning, at noon, in the evening, and at bedtime.     TRESIBA  FLEXTOUCH 100 UNIT/ML FlexTouch Pen INJECT 15 UNITS SUBCUTANEOUSLY ONCE DAILY. ADJUST DOSE AS DIRECTED 15 mL 3   No current facility-administered medications on file prior to visit.     ROS see history of present illness  Objective  Physical Exam Vitals:   06/22/23 1311  BP: 126/74  Pulse: 62  Resp: 20  Temp: 98.1 F (36.7 C)  SpO2: 98%    BP Readings from Last 3 Encounters:  06/22/23 126/74  04/13/23 130/68  12/30/22 132/72   Wt Readings from Last 3 Encounters:  06/22/23 138 lb 8 oz (62.8 kg)  04/18/23 138 lb 6 oz (62.8 kg)  04/13/23 138 lb (62.6 kg)    Physical Exam Constitutional:      General: She is not in acute distress.    Appearance: Normal appearance.  HENT:     Head: Normocephalic.   Cardiovascular:     Rate and Rhythm: Normal rate and regular rhythm.     Heart sounds: Normal heart sounds.  Pulmonary:     Effort: Pulmonary effort is normal.     Breath sounds: Normal breath sounds.   Skin:    General: Skin is warm and dry.   Neurological:     General: No focal deficit present.     Mental Status: She is alert.   Psychiatric:        Mood and Affect: Mood normal.        Behavior: Behavior normal.      Assessment/Plan: Please see individual problem list.  Abdominal aortic aneurysm (AAA) without rupture, unspecified part (HCC) Assessment & Plan: No change in the aortic aneurysm over the past three years. Monitor with  imaging every three years.   Essential hypertension Assessment & Plan: Their hypertension is well-controlled on Amlodipine , Atenolol , and Losartan , with no symptoms of chest pain, shortness of breath, or dizziness. They will continue the current antihypertensive regimen.   Idiopathic chronic pancreatitis (HCC) Assessment & Plan: Continue current medication regimen- Tagamet, Protonix  and Creon . Follow up with GI.    Type 2 diabetes mellitus with hyperglycemia, with long-term current use of insulin  (HCC) Assessment & Plan: Glycemic control has improved, with Hemoglobin A1c reduced from 8.5% to 7.6%. Continue Tresiba   and Humalog, and maintain glucose monitoring with Dexcom. Encourage healthy diet. Follow up with Endocrinology as scheduled.    Hypertensive kidney disease with stage 3b chronic kidney disease (HCC) Assessment & Plan: Recent improvement noted. Check BMP today. Encourage adequate hydration and avoiding NSAIDs.   Orders: -     Basic metabolic panel with GFR  History of stroke Assessment & Plan: Residual weakness persists in the index finger with no new neurological symptoms. Continue current management and monitoring   Postmenopausal estrogen deficiency -     DG Bone Density; Future -     VITAMIN D  25 Hydroxy (Vit-D Deficiency, Fractures)      Return in about 6 months (around 12/22/2023) for Follow up.   Leron Glance, NP-C [facility]

## 2023-06-23 ENCOUNTER — Ambulatory Visit: Payer: Self-pay | Admitting: Nurse Practitioner

## 2023-06-23 LAB — BASIC METABOLIC PANEL WITH GFR
BUN: 13 mg/dL (ref 6–23)
CO2: 27 meq/L (ref 19–32)
Calcium: 10.5 mg/dL (ref 8.4–10.5)
Chloride: 102 meq/L (ref 96–112)
Creatinine, Ser: 1.3 mg/dL — ABNORMAL HIGH (ref 0.40–1.20)
GFR: 39.46 mL/min — ABNORMAL LOW (ref >60.00)
Glucose, Bld: 91 mg/dL (ref 70–99)
Potassium: 4.3 meq/L (ref 3.5–5.1)
Sodium: 138 meq/L (ref 135–145)

## 2023-06-23 LAB — VITAMIN D 25 HYDROXY (VIT D DEFICIENCY, FRACTURES): VITD: 23.47 ng/mL — ABNORMAL LOW (ref 30.00–100.00)

## 2023-06-28 ENCOUNTER — Encounter: Payer: Self-pay | Admitting: Nurse Practitioner

## 2023-06-28 NOTE — Assessment & Plan Note (Signed)
 Glycemic control has improved, with Hemoglobin A1c reduced from 8.5% to 7.6%. Continue Tresiba  and Humalog, and maintain glucose monitoring with Dexcom. Encourage healthy diet. Follow up with Endocrinology as scheduled.

## 2023-06-28 NOTE — Assessment & Plan Note (Signed)
 No change in the aortic aneurysm over the past three years. Monitor with imaging every three years.

## 2023-06-28 NOTE — Assessment & Plan Note (Signed)
Continue current medication regimen- Tagamet, Protonix and Creon. Follow up with GI.

## 2023-06-28 NOTE — Assessment & Plan Note (Signed)
 Recent improvement noted. Check BMP today. Encourage adequate hydration and avoiding NSAIDs.

## 2023-06-28 NOTE — Assessment & Plan Note (Signed)
 Their hypertension is well-controlled on Amlodipine, Atenolol, and Losartan, with no symptoms of chest pain, shortness of breath, or dizziness. They will continue the current antihypertensive regimen.

## 2023-06-28 NOTE — Assessment & Plan Note (Signed)
 Residual weakness persists in the index finger with no new neurological symptoms. Continue current management and monitoring

## 2023-08-09 ENCOUNTER — Ambulatory Visit: Admitting: Gastroenterology

## 2023-09-19 ENCOUNTER — Ambulatory Visit: Admitting: Nurse Practitioner

## 2023-10-06 ENCOUNTER — Encounter: Payer: Self-pay | Admitting: Gastroenterology

## 2023-10-06 ENCOUNTER — Ambulatory Visit: Admitting: Gastroenterology

## 2023-10-06 VITALS — BP 104/60 | HR 56 | Ht 66.0 in | Wt 137.2 lb

## 2023-10-06 DIAGNOSIS — K862 Cyst of pancreas: Secondary | ICD-10-CM | POA: Diagnosis not present

## 2023-10-06 DIAGNOSIS — K219 Gastro-esophageal reflux disease without esophagitis: Secondary | ICD-10-CM | POA: Diagnosis not present

## 2023-10-06 DIAGNOSIS — K8681 Exocrine pancreatic insufficiency: Secondary | ICD-10-CM | POA: Diagnosis not present

## 2023-10-06 DIAGNOSIS — Z8711 Personal history of peptic ulcer disease: Secondary | ICD-10-CM | POA: Diagnosis not present

## 2023-10-06 MED ORDER — PANCRELIPASE (LIP-PROT-AMYL) 36000-114000 UNITS PO CPEP: ORAL_CAPSULE | ORAL | 4 refills | Status: AC

## 2023-10-06 MED ORDER — PANTOPRAZOLE SODIUM 40 MG PO TBEC: 40.0000 mg | DELAYED_RELEASE_TABLET | Freq: Two times a day (BID) | ORAL | 3 refills | Status: AC

## 2023-10-06 NOTE — Progress Notes (Signed)
 GASTROENTEROLOGY OUTPATIENT CLINIC VISIT   Primary Care Provider Gretel App, NP [address] [place] [address] [phone]  Patient Profile: Holly Navarro is a 78 y.o. female with a pmh significant for status post CVA, hypertension, hyperlipidemia anxiety, arthritis peptic ulcer disease, seizures, AAA, GERD, insulin -dependent diabetes (secondary to severe pancreatitis), family history colon cancer (son), family history of pancreas cancer (brother), idiopathic pancreatitis in 2022 (complicated pancreatic pseudocyst with walled off necrosis, splenic vein thrombosis-now decompressed and prior necrosectomy but now with recurrent pseudocyst now status post repeat drainage/aspiration).  The patient presents to the [facility] for an evaluation and management of problem(s) noted below:  Problem List 1. Pancreatic cyst   2. Exocrine pancreatic insufficiency   3. Gastroesophageal reflux disease, unspecified whether esophagitis present   4. History of peptic ulcer disease    Discussed the use of AI scribe software for clinical note transcription with the patient, who gave verbal consent to proceed.  History of Present Illness Please see prior notes for full details of HPI.  Interval History Holly Navarro is a 78 year old female with a history of necrotizing pancreatitis who presents for routine follow-up.  She was last seen in clinic last year but had a CTAP done in May.  Overall, stability of the cyst has been noted.  She feels well overall and maintains her weight between 135 and 138 pounds. No new symptoms such as dysphagia, abdominal pain, bloating, changes in bowel habits, or hematochezia.  She is keeping her BG control and has follow-up with her Endocrinologist in the coming weeks.  She feels tired more easily, attributing it to aging, but can still perform daily activities.  She uses Creon  for exocrine pancreatic insufficiency.  She is not noting  changes in her bowel habits.  She is on twice daily PPI and does not have symptoms of GERD with this.  She lives independently.   GI Review of Systems Positive as above Negative for dysphagia, odynophagia, alteration of bowel habits, melena, hematochezia   Review of Systems General: Denies fevers/chills Cardiovascular: Denies chest pain/palpitations Pulmonary: Denies shortness of breath Gastroenterological: See HPI Genitourinary: Denies darkened urine Hematological: Denies easy bruising/bleeding Dermatological: Denies jaundice Psychological: Mood is stable   Medications Current Outpatient Medications  Medication Sig Dispense Refill   ALPRAZolam  (XANAX ) 0.25 MG tablet Take 1 tablet (0.25 mg total) by mouth daily as needed for anxiety. 14 tablet 1   amLODipine  (NORVASC ) 5 MG tablet Take 1 tablet (5 mg total) by mouth daily. 90 tablet 3   aspirin  EC 81 MG tablet Take 1 tablet (81 mg total) by mouth daily. 90 tablet 3   atenolol  (TENORMIN ) 50 MG tablet Take 1 tablet by mouth once daily 90 tablet 3   cimetidine (TAGAMET) 200 MG tablet Take 200 mg by mouth as needed (indigestion/heartburn.).     Coenzyme Q10 (COQ10) 100 MG CAPS Take 100 mg by mouth in the morning.     Continuous Glucose Sensor (DEXCOM G7 SENSOR) MISC by Does not apply route.     diphenhydrAMINE  (BENADRYL ) 25 mg capsule Take 25 mg by mouth every 6 (six) hours as needed for allergies.     ECHINACEA-GOLDENSEAL PO Take 1 capsule by mouth in the morning and at bedtime.     fluticasone  (FLONASE ) 50 MCG/ACT nasal spray Place 2 sprays into both nostrils daily as needed. 16 g 1   insulin  lispro (HUMALOG) 100 UNIT/ML KwikPen SMARTSIG:5-10 Unit(s) SUB-Q 3 Times Daily     Insulin   Pen Needle (NOVOFINE PEN NEEDLE) 32G X 6 MM MISC Use to inject insulin  daily as directed. 90 each 1   lipase/protease/amylase (CREON ) 36000 UNITS CPEP capsule Take 2 capsules by mouth three times with each meal and 1 capsule with each snack daily( up to 2  snacks). 100 capsule 0   losartan  (COZAAR ) 100 MG tablet Take 100 mg by mouth daily.     MILK THISTLE PO Take 1 tablet by mouth in the morning and at bedtime.     Multiple Vitamins-Minerals (MULTIVITAMIN ADULTS 50+) TABS Take 1 tablet by mouth daily in the afternoon.     rosuvastatin  (CRESTOR ) 10 MG tablet Take 1 tablet by mouth once daily 90 tablet 3   simethicone  (MYLICON) 80 MG chewable tablet Chew 80 mg by mouth in the morning, at noon, in the evening, and at bedtime.     Simethicone  125 MG CAPS Take 125 mg by mouth in the morning, at noon, in the evening, and at bedtime.     traMADol-acetaminophen  (ULTRACET) 37.5-325 MG tablet Take 2 tablets by mouth every 6 (six) hours as needed.     TRESIBA  FLEXTOUCH 100 UNIT/ML FlexTouch Pen INJECT 15 UNITS SUBCUTANEOUSLY ONCE DAILY. ADJUST DOSE AS DIRECTED 15 mL 3   pantoprazole  (PROTONIX ) 40 MG tablet Take 1 tablet (40 mg total) by mouth 2 (two) times daily before a meal. 180 tablet 3   No current facility-administered medications for this visit.    Allergies Allergies  Allergen Reactions   Fentanyl  Other (See Comments)    Patient preference, not an allergy    Morphine  And Codeine Other (See Comments)    Patient preference, not an allergy      Histories Past Medical History:  Diagnosis Date   Anemia    Anxiety    Arthritis    Cardiac arrhythmia    Exocrine pancreatic insufficiency    Hyperlipidemia    Hypertension    Necrotizing pancreatitis 02/2020   Pancreatic pseudocyst    Secondary diabetes mellitus (HCC)    Seizure (HCC)    pt doesnt remember but was told had one in hospital 2022   Stomach ulcer    Stroke (HCC) 09/03/2022   Wears dentures    full upper, partial lower   Past Surgical History:  Procedure Laterality Date   BIOPSY  05/20/2020   Procedure: BIOPSY;  Surgeon: Wilhelmenia Aloha Raddle., MD;  Location: [facility];  Service: Gastroenterology;;   BIOPSY  02/25/2021   Procedure: BIOPSY;  Surgeon: Wilhelmenia Aloha Raddle., MD;  [facility];  Service: Gastroenterology;;   BIOPSY  06/24/2021   Procedure: BIOPSY;  Surgeon: Wilhelmenia Aloha Raddle., MD;  Location: [facility] ENDOSCOPY;  Service: Gastroenterology;;   CATARACT EXTRACTION W/PHACO Right 02/03/2022   Procedure: CATARACT EXTRACTION PHACO AND INTRAOCULAR LENS PLACEMENT (IOC) RIGHT DIABETIC  9.42  01:08.4;  Surgeon: Enola Feliciano Hugger, MD;  Location: [facility];  Service: Ophthalmology;  Laterality: Right;  Diabetic   CATARACT EXTRACTION W/PHACO Left 03/03/2022   Procedure: CATARACT EXTRACTION PHACO AND INTRAOCULAR LENS PLACEMENT (IOC) LEFT DIABETIC  12.57  01:09.8;  Surgeon: Enola Feliciano Hugger, MD;  Location: [facility];  Service: Ophthalmology;  Laterality: Left;   CYST GASTROSTOMY  06/24/2021   Procedure: NECROSECTOMY;  Surgeon: Wilhelmenia Aloha Raddle., MD;  Location: [facility] ENDOSCOPY;  Service: Gastroenterology;;   ESOPHAGOGASTRODUODENOSCOPY N/A 05/20/2020   Procedure: ESOPHAGOGASTRODUODENOSCOPY (EGD);  Surgeon: Wilhelmenia Aloha Raddle., MD;  Location: [facility];  Service: Gastroenterology;  Laterality: N/A;   ESOPHAGOGASTRODUODENOSCOPY N/A 01/18/2022  Procedure: ESOPHAGOGASTRODUODENOSCOPY (EGD);  Surgeon: Wilhelmenia Aloha Raddle., MD;  Location: [facility];  Service: Gastroenterology;  Laterality: N/A;   ESOPHAGOGASTRODUODENOSCOPY (EGD) WITH PROPOFOL  N/A 02/25/2021   Procedure: ESOPHAGOGASTRODUODENOSCOPY (EGD) WITH PROPOFOL ;  Surgeon: Wilhelmenia Aloha Raddle., MD;  Location: [facility] ENDOSCOPY;  Service: Gastroenterology;  Laterality: N/A;   ESOPHAGOGASTRODUODENOSCOPY (EGD) WITH PROPOFOL  N/A 06/24/2021   Procedure: ESOPHAGOGASTRODUODENOSCOPY (EGD) WITH PROPOFOL ;  Surgeon: Wilhelmenia Aloha Raddle., MD;  Location: [facility] ENDOSCOPY;  Service: Gastroenterology;  Laterality: N/A;   EUS N/A 01/18/2022   Procedure: UPPER ENDOSCOPIC ULTRASOUND (EUS) LINEAR;  Surgeon: Wilhelmenia Aloha Raddle., MD;  Location: [facility]COPY;  Service: Gastroenterology;   Laterality: N/A;   FINE NEEDLE ASPIRATION N/A 01/18/2022   Procedure: FINE NEEDLE ASPIRATION (FNA) LINEAR;  Surgeon: Wilhelmenia Aloha Raddle., MD;  Location: [facility] ENDOSCOPY;  Service: Gastroenterology;  Laterality: N/A;   FOREIGN BODY REMOVAL  06/24/2021   Procedure: FOREIGN BODY REMOVAL;  Surgeon: Wilhelmenia Aloha Raddle., MD;  Location: [facility] ENDOSCOPY;  Service: Gastroenterology;;   LAPAROSCOPIC OVARIAN CYSTECTOMY     STENT REMOVAL  06/24/2021   Procedure: STENT REMOVAL;  Surgeon: Wilhelmenia Aloha Raddle., MD;  Location: [facility] ENDOSCOPY;  Service: Gastroenterology;;   TONSILLECTOMY     UPPER ESOPHAGEAL ENDOSCOPIC ULTRASOUND (EUS) N/A 05/20/2020   Procedure: UPPER ESOPHAGEAL ENDOSCOPIC ULTRASOUND (EUS)  ;  Surgeon: Wilhelmenia Aloha Raddle., MD;  Location: [facility];  Service: Gastroenterology;  Laterality: N/A;   UPPER ESOPHAGEAL ENDOSCOPIC ULTRASOUND (EUS) N/A 02/25/2021   Procedure: UPPER ESOPHAGEAL ENDOSCOPIC ULTRASOUND (EUS);  Surgeon: Wilhelmenia Aloha Raddle., MD;  Location: [facility] END[facility]COPY;  Service: Gastroenterology;  Laterality: N/A;   Social History   Socioeconomic History   Marital status: Widowed    Spouse name: Not on file   Number of children: 4   Years of education: Not on file   Highest education level: Some college, no degree  Occupational History   Occupation: retired  Tobacco Use   Smoking status: Every Day    Current packs/day: 0.25    Average packs/day: 0.3 packs/day for 60.0 years (15.0 ttl pk-yrs)    Types: Cigarettes   Smokeless tobacco: Never   Tobacco comments:    0.25 a pack a day (started age 49)///one or two cig a day 10/13/2022  Vaping Use   Vaping status: Never Used  Substance and Sexual Activity   Alcohol use: Not Currently   Drug use: No   Sexual activity: Not on file  Other Topics Concern   Not on file  Social History Narrative   Are you right handed or left handed? right   Are you currently employed ?    What is your current occupation?retired   Do you  live at home alone?yes   Who lives with you?    What type of home do you live in: 1 story or 2 story? one    Caffeine 1/1/2 a day    Social Drivers of Health   Financial Resource Strain: Low Risk  (06/20/2023)   Overall Financial Resource Strain (CARDIA)    Difficulty of Paying Living Expenses: Not hard at all  Food Insecurity: No Food Insecurity (06/20/2023)   Hunger Vital Sign    Worried About Running Out of Food in the Last Year: Never true    Ran Out of Food in the Last Year: Never true  Transportation Needs: No Transportation Needs (06/20/2023)   PRAPARE - Administrator, Civil Service (Medical): No    Lack of Transportation (Non-Medical): No  Physical Activity: Insufficiently Active (06/20/2023)  Exercise Vital Sign    Days of Exercise per Week: 7 days    Minutes of Exercise per Session: 20 min  Stress: No Stress Concern Present (06/20/2023)   [employer] of Occupational Health - Occupational Stress Questionnaire    Feeling of Stress: Only a little  Social Connections: Moderately Integrated (06/20/2023)   Social Connection and Isolation Panel    Frequency of Communication with Friends and Family: More than three times a week    Frequency of Social Gatherings with Friends and Family: Once a week    Attends Religious Services: More than 4 times per year    Active Member of [employer] or Organizations: Yes    Attends Banker Meetings: More than 4 times per year    Marital Status: Widowed  Intimate Partner Violence: Not At Risk (04/18/2023)   Humiliation, Afraid, Rape, and Kick questionnaire    Fear of Current or Ex-Partner: No    Emotionally Abused: No    Physically Abused: No    Sexually Abused: No   Family History  Problem Relation Age of Onset   Heart disease Mother    Kidney disease Mother    Diabetes Father    Pancreatic cancer Brother        Agent Orange   Gallstones Daughter    Colon cancer Son 43   Diabetes Paternal Uncle        x 2    Esophageal cancer Neg Hx    Inflammatory bowel disease Neg Hx    Liver disease Neg Hx    Stomach cancer Neg Hx    I have reviewed her medical, social, and family history in detail and updated the electronic medical record as necessary.    PHYSICAL EXAMINATION  BP 104/60   Pulse (!) 56   Ht 5' 6 (1.676 m)   Wt 137 lb 4 oz (62.3 kg)   BMI 22.15 kg/m  GEN: NAD, appears stated age, doesn't appear chronically ill PSYCH: Cooperative, without pressured speech EYE: Conjunctivae pink, sclerae anicteric ENT: MMM CV: Nontachycardic RESP: No audible wheezing GI: NABS, soft, NT/ND, without rebound MSK/EXT: No lower extremity edema SKIN: No jaundice NEURO:  Alert & Oriented x 3, no noted deficits   REVIEW OF DATA  I reviewed the following data at the time of this encounter:  GI Procedures and Studies  No new GI studies to review  Laboratory Studies  Reviewed those in epic and care everywhere  Imaging Studies  May 2025 CTAP IMPRESSION: 1. No acute abnormality in the abdomen or pelvis. 2. Chronic pancreatic parenchymal atrophy. No main duct dilation, fluid collection or peripancreatic inflammation. 3. Abdominal aortic aneurysm measuring 3.2 cm. Recommend surveillance ultrasound in 3 years. 4. Enlarged periuterine venous plexus and prominent left gonadal vein. Findings may represent pelvic congestion syndrome. 5. Chronic splenic vein thrombosis with perisplenic and perigastric varices.   ASSESSMENT/PLAN  Ms. Meine is a 78 y.o. female.  The patient is seen today for evaluation and management of:  1. Pancreatic cyst   2. Exocrine pancreatic insufficiency   3. Gastroesophageal reflux disease, unspecified whether esophagitis present   4. History of peptic ulcer disease    Assessment and Plan    The patient is clinically and hemodynamically stable at this time.     History of Necrotizing Pancreatitis with now persistent/Chronic pancreatic pseudocyst and EPI The pancreatic  pseudocyst is chronic in nature.  Never noted to be premalignant.  She is well without significant symptoms at this time.  We will plan to monitor with yearly imaging (will be due in Spring 2026).  She will alert us  if new symptoms develop that require earlier imaging.  If repeat imaging is concerning for progressive nature of perigastric varices or collateralization she may require a repeat EGD.  She will maintain her current PERT therapy.   - April/May 2026 Pancreas Protocol CT to be performed - Schedule follow-up appointment in May 2026 to review imaging results. - Earlier imaging if symptoms occur or recurrent pancreatitis. - Evaluate for development or progression of perigastric varices during the next imaging study as an EGD may be required in the future. - Renew Creon  and make sure no other needs from the Patient Assistance program.   GERD Stable.  Will see if we can decrease PPI and if no significant symptoms, then maintain once daily dosing. - Renewal Rx for 1-year - If patient does well with once daily dosing she will MyChart/Call office to let us  know and we will update her prescription.   Cologuard repeat testing in 2026  No orders of the defined types were placed in this encounter.   New Prescriptions   No medications on file   Modified Medications   Modified Medication Previous Medication   PANTOPRAZOLE  (PROTONIX ) 40 MG TABLET pantoprazole  (PROTONIX ) 40 MG tablet      Take 1 tablet (40 mg total) by mouth 2 (two) times daily before a meal.    TAKE 1 TABLET BY MOUTH TWICE DAILY BEFORE A MEAL    Planned Follow Up No follow-ups on file.   Total Time in Face-to-Face and in Coordination of Care for patient including independent/personal interpretation/review of prior testing, medical history, examination, medication adjustment, review of imaging, communicating results with the patient directly, and documentation with the EHR is 25 minutes.   Aloha Finner, MD [place]  [facility] Office # [phone]

## 2023-10-06 NOTE — Patient Instructions (Signed)
 We have sent the following medications to your pharmacy for you to pick up at your convenience: Pantoprazole   Decrease to once daily. If once daily is not working then you may increase back to twice daily.   Send My chart message in 2 weeks with update on symptoms.   Refill for Creon  sent to [facility].   _______________________________________________________  If your blood pressure at your visit was 140/90 or greater, please contact your primary care physician to follow up on this.  _______________________________________________________  If you are age 78 or older, your body mass index should be between 23-30. Your Body mass index is 22.15 kg/m. If this is out of the aforementioned range listed, please consider follow up with your Primary Care Provider.  If you are age 9 or younger, your body mass index should be between 19-25. Your Body mass index is 22.15 kg/m. If this is out of the aformentioned range listed, please consider follow up with your Primary Care Provider.   ________________________________________________________  The [facility] providers would like to encourage you to use MYCHART to communicate with providers for non-urgent requests or questions.  Due to long hold times on the telephone, sending your provider a message by [facility] may be a faster and more efficient way to get a response.  Please allow 48 business hours for a response.  Please remember that this is for non-urgent requests.  _______________________________________________________  [facility] is using a team-based approach to care.  Your team is made up of your doctor and two to three APPS. Our APPS (Nurse Practitioners and Physician Assistants) work with your physician to ensure care continuity for you. They are fully qualified to address your health concerns and develop a treatment plan. They communicate directly with your gastroenterologist to care for you. Seeing the [facility]  Practitioners on your physician's team can help you by facilitating care more promptly, often allowing for earlier appointments, access to diagnostic testing, procedures, and other specialty referrals.   Thank you for choosing me and  Gastroenterology.  Dr. Wilhelmenia

## 2023-10-16 ENCOUNTER — Encounter: Payer: Self-pay | Admitting: Nurse Practitioner

## 2023-10-16 DIAGNOSIS — I10 Essential (primary) hypertension: Secondary | ICD-10-CM

## 2023-10-16 MED ORDER — AMLODIPINE BESYLATE 5 MG PO TABS: 5.0000 mg | ORAL_TABLET | Freq: Every day | ORAL | 2 refills | Status: AC

## 2023-10-23 DIAGNOSIS — Z23 Encounter for immunization: Secondary | ICD-10-CM | POA: Diagnosis not present

## 2023-10-23 DIAGNOSIS — I10 Essential (primary) hypertension: Secondary | ICD-10-CM | POA: Diagnosis not present

## 2023-10-23 DIAGNOSIS — K861 Other chronic pancreatitis: Secondary | ICD-10-CM | POA: Diagnosis not present

## 2023-10-23 DIAGNOSIS — E1365 Other specified diabetes mellitus with hyperglycemia: Secondary | ICD-10-CM | POA: Diagnosis not present

## 2023-10-23 DIAGNOSIS — K8681 Exocrine pancreatic insufficiency: Secondary | ICD-10-CM | POA: Diagnosis not present

## 2023-10-23 DIAGNOSIS — N183 Chronic kidney disease, stage 3 unspecified: Secondary | ICD-10-CM | POA: Diagnosis not present

## 2023-10-23 DIAGNOSIS — E782 Mixed hyperlipidemia: Secondary | ICD-10-CM | POA: Diagnosis not present

## 2023-11-29 ENCOUNTER — Telehealth: Payer: Self-pay

## 2023-11-29 NOTE — Telephone Encounter (Signed)
 Copied from CRM #[id number]. Topic: Clinical - Prescription Issue >> Nov 29, 2023 10:53 AM Ashley R wrote:  Reason for CRM: Starting Jan 1 TRESIBA  FLEXTOUCH 100 UNIT/ML FlexTouch Pen will no be covered. Two options available for insulin :   Lantus Charyl Rushing [phone]

## 2023-12-13 ENCOUNTER — Ambulatory Visit: Admitting: Nurse Practitioner

## 2023-12-13 ENCOUNTER — Telehealth: Payer: Self-pay | Admitting: Nurse Practitioner

## 2023-12-13 NOTE — Telephone Encounter (Signed)
 Lm and sent MyChart message: Holly Navarro is out of the office today, 12/13/23. Please call the office or reschedule your appointment through MyChart.  E2C2 please reschedule appt

## 2024-01-10 ENCOUNTER — Ambulatory Visit: Admitting: Nurse Practitioner

## 2024-01-10 ENCOUNTER — Encounter: Payer: Self-pay | Admitting: Nurse Practitioner

## 2024-01-10 VITALS — BP 130/78 | HR 58 | Temp 97.8°F | Ht 66.0 in | Wt 140.0 lb

## 2024-01-10 DIAGNOSIS — Z8673 Personal history of transient ischemic attack (TIA), and cerebral infarction without residual deficits: Secondary | ICD-10-CM

## 2024-01-10 DIAGNOSIS — N1832 Chronic kidney disease, stage 3b: Secondary | ICD-10-CM

## 2024-01-10 DIAGNOSIS — K861 Other chronic pancreatitis: Secondary | ICD-10-CM

## 2024-01-10 DIAGNOSIS — E1165 Type 2 diabetes mellitus with hyperglycemia: Secondary | ICD-10-CM | POA: Diagnosis not present

## 2024-01-10 DIAGNOSIS — E782 Mixed hyperlipidemia: Secondary | ICD-10-CM | POA: Diagnosis not present

## 2024-01-10 DIAGNOSIS — Z794 Long term (current) use of insulin: Secondary | ICD-10-CM

## 2024-01-10 DIAGNOSIS — I129 Hypertensive chronic kidney disease with stage 1 through stage 4 chronic kidney disease, or unspecified chronic kidney disease: Secondary | ICD-10-CM | POA: Diagnosis not present

## 2024-01-10 DIAGNOSIS — I10 Essential (primary) hypertension: Secondary | ICD-10-CM | POA: Diagnosis not present

## 2024-01-10 LAB — COMPREHENSIVE METABOLIC PANEL WITH GFR
ALT: 13 U/L (ref 3–35)
AST: 20 U/L (ref 5–37)
Albumin: 4.4 g/dL (ref 3.5–5.2)
Alkaline Phosphatase: 87 U/L (ref 39–117)
BUN: 18 mg/dL (ref 6–23)
CO2: 28 meq/L (ref 19–32)
Calcium: 9.7 mg/dL (ref 8.4–10.5)
Chloride: 99 meq/L (ref 96–112)
Creatinine, Ser: 1.32 mg/dL — ABNORMAL HIGH (ref 0.40–1.20)
GFR: 38.6 mL/min — ABNORMAL LOW
Glucose, Bld: 168 mg/dL — ABNORMAL HIGH (ref 70–99)
Potassium: 4.3 meq/L (ref 3.5–5.1)
Sodium: 134 meq/L — ABNORMAL LOW (ref 135–145)
Total Bilirubin: 0.5 mg/dL (ref 0.2–1.2)
Total Protein: 7.2 g/dL (ref 6.0–8.3)

## 2024-01-10 LAB — LIPID PANEL
Cholesterol: 255 mg/dL — ABNORMAL HIGH (ref 28–200)
HDL: 45.1 mg/dL
LDL Cholesterol: 159 mg/dL — ABNORMAL HIGH (ref 10–99)
NonHDL: 210.26
Total CHOL/HDL Ratio: 6
Triglycerides: 254 mg/dL — ABNORMAL HIGH (ref 10.0–149.0)
VLDL: 50.8 mg/dL — ABNORMAL HIGH (ref 0.0–40.0)

## 2024-01-10 NOTE — Assessment & Plan Note (Signed)
 Their hypertension is well-controlled on Amlodipine , Atenolol , and Losartan , with no symptoms of chest pain, shortness of breath, or dizziness. They will continue the current antihypertensive regimen and home monitoring.

## 2024-01-10 NOTE — Progress Notes (Signed)
 " Leron Glance, NP-C Phone: [phone]  Holly Navarro is a 79 y.o. female who presents today for follow up.   Discussed the use of AI scribe software for clinical note transcription with the patient, who gave verbal consent to proceed.  History of Present Illness   Holly Navarro is a 79 year old female with a history of stroke and EPI who presents with concerns about medication changes and blood sugar management.  She stopped taking rosuvastatin  about a month ago because she was experiencing significant aches and pains, which she believed might be related to the medication. She switched to taking Red Yeast Rice instead. Since stopping rosuvastatin , she notes a significant reduction in pain and an improvement in her energy levels. She also started taking Geritol, which she feels has improved her stamina, allowing her to perform daily activities more effectively.  She has a history of EPI and is concerned about the impact of this condition on her nutritional status. She takes Creon  to manage her EPI and has been cautious about introducing new supplements, ensuring they are compatible with her condition. She reports that the EPI has affected her body's ability to absorb essential minerals and vitamins, leading her to supplement with Geritol, which contains B vitamins and folate.  Her blood sugar levels have been monitored using a Dexcom device. She reports fluctuations in her blood sugar, with levels sometimes dropping to 53 mg/dL and rising to 799 mg/dL. Her average blood sugar is around 165 mg/dL. She experiences hypoglycemic episodes, particularly after physical activity, but manages them with her current regimen of Tresiba  and Novolog . Her A1c is in the low sevens.  She has a history of hypertriglyceridemia and has recently started taking flaxseed oil capsules on the recommendation of her daughter, who experienced a significant drop in her own triglyceride levels with this supplement. She  previously had high triglycerides even before her diabetes diagnosis, and her father had diabetes, suggesting a possible familial component.  Her blood pressure is generally well-controlled with amlodipine  and losartan , with home readings averaging around 117/75 mmHg. She experienced a stroke while on rosuvastatin . She monitors her blood pressure regularly. No chest pain, shortness of breath, dizziness, or swelling.      Tobacco Use History[1]  Medications Ordered Prior to Encounter[2]   ROS see history of present illness  Objective  Physical Exam Vitals:   01/10/24 1102  BP: 130/78  Pulse: (!) 58  Temp: 97.8 F (36.6 C)  SpO2: 98%    BP Readings from Last 3 Encounters:  01/10/24 130/78  10/06/23 104/60  06/22/23 126/74   Wt Readings from Last 3 Encounters:  01/10/24 140 lb (63.5 kg)  10/06/23 137 lb 4 oz (62.3 kg)  06/22/23 138 lb 8 oz (62.8 kg)    Physical Exam Constitutional:      General: She is not in acute distress.    Appearance: Normal appearance.  HENT:     Head: Normocephalic.  Cardiovascular:     Rate and Rhythm: Normal rate and regular rhythm.     Heart sounds: Normal heart sounds.  Pulmonary:     Effort: Pulmonary effort is normal.     Breath sounds: Normal breath sounds.  Skin:    General: Skin is warm and dry.  Neurological:     General: No focal deficit present.     Mental Status: She is alert.  Psychiatric:        Mood and Affect: Mood normal.  Behavior: Behavior normal.      Assessment/Plan: Please see individual problem list.  Mixed dyslipidemia Assessment & Plan: Rosuvastatin  was discontinued due to side effects, and she prefers natural supplements. Awaiting lipid profile results. Cholesterol management was discussed to reduce cardiovascular risk. Ordered a fasting lipid panel. Continue red yeast rice, flaxseed oil, and Geritol. Discuss lab results once available.  Orders: -     Comprehensive metabolic panel with GFR -      Lipid panel  Type 2 diabetes mellitus with hyperglycemia, with long-term current use of insulin  (HCC) Assessment & Plan: Average glucose is 165 mg/dL with episodes of hypoglycemia. A1c is in the low 7s, with noted variability in glucose levels. Continue Tresiba  and Novolog . Follow up with endocrinology on January 20th.   Idiopathic chronic pancreatitis (HCC) Assessment & Plan: Continue current medication regimen- Tagamet, Protonix  and Creon . Follow up with GI.    Essential hypertension Assessment & Plan: Their hypertension is well-controlled on Amlodipine , Atenolol , and Losartan , with no symptoms of chest pain, shortness of breath, or dizziness. They will continue the current antihypertensive regimen and home monitoring.    History of stroke Assessment & Plan: There is concern about cholesterol management due to her stroke history. Cardiovascular risk reduction and statin side effects were discussed. Ordered a fasting lipid panel. Discuss lab results once available.    Hypertensive kidney disease with stage 3b chronic kidney disease (HCC) -     Comprehensive metabolic panel with GFR      Return in about 6 months (around 07/09/2024) for Follow up.   Leron Glance, NP-C [facility] [place]     [1]  Social History Tobacco Use  Smoking Status Every Day   Current packs/day: 0.25   Average packs/day: 0.3 packs/day for 60.0 years (15.0 ttl pk-yrs)   Types: Cigarettes  Smokeless Tobacco Never  Tobacco Comments   0.25 a pack a day (started age 15)///one or two cig a day 10/13/2022  [2]  Current Outpatient Medications on File Prior to Visit  Medication Sig Dispense Refill   ALPRAZolam  (XANAX ) 0.25 MG tablet Take 1 tablet (0.25 mg total) by mouth daily as needed for anxiety. 14 tablet 1   amLODipine  (NORVASC ) 5 MG tablet Take 1 tablet (5 mg total) by mouth daily. 90 tablet 2   aspirin  EC 81 MG tablet Take 1 tablet (81 mg total) by mouth daily. 90 tablet 3    atenolol  (TENORMIN ) 50 MG tablet Take 1 tablet by mouth once daily 90 tablet 3   cimetidine (TAGAMET) 200 MG tablet Take 200 mg by mouth as needed (indigestion/heartburn.).     Coenzyme Q10 (COQ10) 100 MG CAPS Take 100 mg by mouth in the morning.     Continuous Glucose Sensor (DEXCOM G7 SENSOR) MISC by Does not apply route.     diphenhydrAMINE  (BENADRYL ) 25 mg capsule Take 25 mg by mouth every 6 (six) hours as needed for allergies.     ECHINACEA-GOLDENSEAL PO Take 1 capsule by mouth in the morning and at bedtime.     fluticasone  (FLONASE ) 50 MCG/ACT nasal spray Place 2 sprays into both nostrils daily as needed. 16 g 1   insulin  lispro (HUMALOG) 100 UNIT/ML KwikPen SMARTSIG:5-10 Unit(s) SUB-Q 3 Times Daily     Insulin  Pen Needle (NOVOFINE PEN NEEDLE) 32G X 6 MM MISC Use to inject insulin  daily as directed. 90 each 1   lipase/protease/amylase (CREON ) 36000 UNITS CPEP capsule Take 2 capsules by mouth three times with each meal and  1 capsule with each snack daily( up to 2 snacks). 300 capsule 4   losartan  (COZAAR ) 100 MG tablet Take 100 mg by mouth daily.     MILK THISTLE PO Take 1 tablet by mouth in the morning and at bedtime.     pantoprazole  (PROTONIX ) 40 MG tablet Take 1 tablet (40 mg total) by mouth 2 (two) times daily before a meal. 180 tablet 3   Red Yeast Rice Extract (RED YEAST RICE PO) Take by mouth.     simethicone  (MYLICON) 80 MG chewable tablet Chew 80 mg by mouth in the morning, at noon, in the evening, and at bedtime.     Simethicone  125 MG CAPS Take 125 mg by mouth in the morning, at noon, in the evening, and at bedtime.     traMADol-acetaminophen  (ULTRACET) 37.5-325 MG tablet Take 2 tablets by mouth every 6 (six) hours as needed.     TRESIBA  FLEXTOUCH 100 UNIT/ML FlexTouch Pen INJECT 15 UNITS SUBCUTANEOUSLY ONCE DAILY. ADJUST DOSE AS DIRECTED 15 mL 3   No current facility-administered medications on file prior to visit.   "

## 2024-01-10 NOTE — Assessment & Plan Note (Signed)
Continue current medication regimen- Tagamet, Protonix and Creon. Follow up with GI.

## 2024-01-10 NOTE — Assessment & Plan Note (Signed)
 There is concern about cholesterol management due to her stroke history. Cardiovascular risk reduction and statin side effects were discussed. Ordered a fasting lipid panel. Discuss lab results once available.

## 2024-01-10 NOTE — Assessment & Plan Note (Signed)
 Average glucose is 165 mg/dL with episodes of hypoglycemia. A1c is in the low 7s, with noted variability in glucose levels. Continue Tresiba  and Novolog . Follow up with endocrinology on January 20th.

## 2024-01-10 NOTE — Assessment & Plan Note (Signed)
 Rosuvastatin  was discontinued due to side effects, and she prefers natural supplements. Awaiting lipid profile results. Cholesterol management was discussed to reduce cardiovascular risk. Ordered a fasting lipid panel. Continue red yeast rice, flaxseed oil, and Geritol. Discuss lab results once available.

## 2024-01-11 ENCOUNTER — Ambulatory Visit: Payer: Self-pay | Admitting: Nurse Practitioner

## 2024-01-12 ENCOUNTER — Other Ambulatory Visit: Payer: Self-pay | Admitting: Nurse Practitioner

## 2024-01-12 DIAGNOSIS — E782 Mixed hyperlipidemia: Secondary | ICD-10-CM

## 2024-02-08 ENCOUNTER — Ambulatory Visit: Admitting: Nurse Practitioner

## 2024-04-08 ENCOUNTER — Other Ambulatory Visit

## 2024-04-23 ENCOUNTER — Ambulatory Visit

## 2024-09-12 ENCOUNTER — Ambulatory Visit: Admitting: Nurse Practitioner
